# Patient Record
Sex: Male | Born: 1960
Health system: Southern US, Community
[De-identification: ages and names within clinical notes are randomized; demographics above are authoritative.]

## PROBLEM LIST (undated history)

## (undated) DIAGNOSIS — F419 Anxiety disorder, unspecified: Secondary | ICD-10-CM

## (undated) DIAGNOSIS — G4730 Sleep apnea, unspecified: Secondary | ICD-10-CM

## (undated) DIAGNOSIS — G43909 Migraine, unspecified, not intractable, without status migrainosus: Secondary | ICD-10-CM

## (undated) DIAGNOSIS — E119 Type 2 diabetes mellitus without complications: Secondary | ICD-10-CM

## (undated) DIAGNOSIS — N200 Calculus of kidney: Secondary | ICD-10-CM

## (undated) DIAGNOSIS — G629 Polyneuropathy, unspecified: Secondary | ICD-10-CM

## (undated) DIAGNOSIS — I739 Peripheral vascular disease, unspecified: Secondary | ICD-10-CM

## (undated) DIAGNOSIS — G4700 Insomnia, unspecified: Secondary | ICD-10-CM

## (undated) DIAGNOSIS — J45909 Unspecified asthma, uncomplicated: Secondary | ICD-10-CM

## (undated) DIAGNOSIS — Z87442 Personal history of urinary calculi: Secondary | ICD-10-CM

## (undated) DIAGNOSIS — Z85828 Personal history of other malignant neoplasm of skin: Secondary | ICD-10-CM

## (undated) DIAGNOSIS — Z8679 Personal history of other diseases of the circulatory system: Secondary | ICD-10-CM

## (undated) DIAGNOSIS — M5090 Cervical disc disorder, unspecified, unspecified cervical region: Secondary | ICD-10-CM

## (undated) DIAGNOSIS — R519 Headache, unspecified: Secondary | ICD-10-CM

## (undated) DIAGNOSIS — M1990 Unspecified osteoarthritis, unspecified site: Secondary | ICD-10-CM

## (undated) DIAGNOSIS — C801 Malignant (primary) neoplasm, unspecified: Secondary | ICD-10-CM

## (undated) DIAGNOSIS — E7800 Pure hypercholesterolemia, unspecified: Secondary | ICD-10-CM

## (undated) DIAGNOSIS — I10 Essential (primary) hypertension: Secondary | ICD-10-CM

## (undated) DIAGNOSIS — Z8701 Personal history of pneumonia (recurrent): Secondary | ICD-10-CM

## (undated) DIAGNOSIS — R531 Weakness: Secondary | ICD-10-CM

## (undated) DIAGNOSIS — E782 Mixed hyperlipidemia: Secondary | ICD-10-CM

## (undated) DIAGNOSIS — D649 Anemia, unspecified: Secondary | ICD-10-CM

## (undated) DIAGNOSIS — J189 Pneumonia, unspecified organism: Secondary | ICD-10-CM

## (undated) DIAGNOSIS — Z8709 Personal history of other diseases of the respiratory system: Secondary | ICD-10-CM

## (undated) HISTORY — DX: Calculus of kidney: N20.0

## (undated) HISTORY — PX: TONSILLECTOMY: SUR1361

## (undated) HISTORY — DX: Polyneuropathy, unspecified: G62.9

## (undated) HISTORY — PX: ELBOW SURGERY: SHX618

## (undated) HISTORY — DX: Type 2 diabetes mellitus without complications: E11.9

## (undated) HISTORY — DX: Peripheral vascular disease, unspecified: I73.9

## (undated) HISTORY — DX: Pure hypercholesterolemia, unspecified: E78.00

## (undated) HISTORY — PX: CERVICAL DISC SURGERY: SHX588

## (undated) HISTORY — DX: Cervical disc disorder, unspecified, unspecified cervical region: M50.90

## (undated) HISTORY — DX: Essential (primary) hypertension: I10

## (undated) HISTORY — DX: Personal history of other malignant neoplasm of skin: Z85.828

## (undated) HISTORY — DX: Personal history of pneumonia (recurrent): Z87.01

## (undated) HISTORY — PX: EYE SURGERY: SHX253

## (undated) HISTORY — DX: Mixed hyperlipidemia: E78.2

## (undated) HISTORY — DX: Migraine, unspecified, not intractable, without status migrainosus: G43.909

---

## 1993-01-11 HISTORY — PX: VASECTOMY: SHX75

## 2004-01-27 ENCOUNTER — Ambulatory Visit (HOSPITAL_COMMUNITY): Admission: RE | Admit: 2004-01-27 | Discharge: 2004-01-27 | Payer: Self-pay | Admitting: Family Medicine

## 2004-03-09 ENCOUNTER — Ambulatory Visit (HOSPITAL_COMMUNITY): Payer: Self-pay | Admitting: Neurology

## 2004-03-09 ENCOUNTER — Encounter (HOSPITAL_COMMUNITY): Admission: RE | Admit: 2004-03-09 | Discharge: 2004-04-08 | Payer: Self-pay | Admitting: Neurology

## 2006-11-18 ENCOUNTER — Ambulatory Visit (HOSPITAL_COMMUNITY): Admission: RE | Admit: 2006-11-18 | Discharge: 2006-11-18 | Payer: Self-pay | Admitting: Pediatrics

## 2006-11-22 ENCOUNTER — Ambulatory Visit: Payer: Self-pay | Admitting: Cardiology

## 2006-11-22 ENCOUNTER — Ambulatory Visit (HOSPITAL_COMMUNITY): Admission: RE | Admit: 2006-11-22 | Discharge: 2006-11-22 | Payer: Self-pay | Admitting: Pediatrics

## 2008-01-12 HISTORY — PX: LUNG SURGERY: SHX703

## 2008-03-22 ENCOUNTER — Ambulatory Visit: Payer: Self-pay | Admitting: Critical Care Medicine

## 2008-03-22 ENCOUNTER — Inpatient Hospital Stay (HOSPITAL_COMMUNITY): Admission: EM | Admit: 2008-03-22 | Discharge: 2008-04-01 | Payer: Self-pay | Admitting: Emergency Medicine

## 2008-03-23 ENCOUNTER — Ambulatory Visit: Payer: Self-pay | Admitting: Thoracic Surgery (Cardiothoracic Vascular Surgery)

## 2008-03-24 ENCOUNTER — Encounter: Payer: Self-pay | Admitting: Thoracic Surgery (Cardiothoracic Vascular Surgery)

## 2008-03-25 ENCOUNTER — Encounter: Payer: Self-pay | Admitting: Thoracic Surgery (Cardiothoracic Vascular Surgery)

## 2008-04-05 ENCOUNTER — Ambulatory Visit: Payer: Self-pay | Admitting: Thoracic Surgery (Cardiothoracic Vascular Surgery)

## 2008-04-19 ENCOUNTER — Encounter
Admission: RE | Admit: 2008-04-19 | Discharge: 2008-04-19 | Payer: Self-pay | Admitting: Thoracic Surgery (Cardiothoracic Vascular Surgery)

## 2008-04-19 ENCOUNTER — Ambulatory Visit: Payer: Self-pay | Admitting: Thoracic Surgery (Cardiothoracic Vascular Surgery)

## 2010-04-23 LAB — BASIC METABOLIC PANEL
BUN: 19 mg/dL (ref 6–23)
BUN: 20 mg/dL (ref 6–23)
BUN: 13 mg/dL (ref 6–23)
CO2: 25 mEq/L (ref 19–32)
CO2: 29 mEq/L (ref 19–32)
CO2: 30 mEq/L (ref 19–32)
Calcium: 8.5 mg/dL (ref 8.4–10.5)
Calcium: 8.5 mg/dL (ref 8.4–10.5)
Chloride: 102 mEq/L (ref 96–112)
Chloride: 99 mEq/L (ref 96–112)
Creatinine, Ser: 1.16 mg/dL (ref 0.4–1.5)
Creatinine, Ser: 1.23 mg/dL (ref 0.4–1.5)
GFR calc Af Amer: >60 mL/min (ref >60)
GFR calc Af Amer: >60 mL/min (ref >60)
GFR calc non Af Amer: 47 mL/min — ABNORMAL LOW (ref >60)
Glucose, Bld: 105 mg/dL — ABNORMAL HIGH (ref 70–99)
Glucose, Bld: 140 mg/dL — ABNORMAL HIGH (ref 70–99)
Potassium: 4.2 mEq/L (ref 3.5–5.1)
Potassium: 4.5 mEq/L (ref 3.5–5.1)
Potassium: 4.5 mEq/L (ref 3.5–5.1)
Sodium: 138 mEq/L (ref 135–145)
Sodium: 135 mEq/L (ref 135–145)
Sodium: 137 mEq/L (ref 135–145)

## 2010-04-23 LAB — GLUCOSE, CAPILLARY
Glucose-Capillary: 117 mg/dL — ABNORMAL HIGH (ref 70–99)
Glucose-Capillary: 140 mg/dL — ABNORMAL HIGH (ref 70–99)
Glucose-Capillary: 144 mg/dL — ABNORMAL HIGH (ref 70–99)
Glucose-Capillary: 154 mg/dL — ABNORMAL HIGH (ref 70–99)
Glucose-Capillary: 161 mg/dL — ABNORMAL HIGH (ref 70–99)
Glucose-Capillary: 162 mg/dL — ABNORMAL HIGH (ref 70–99)
Glucose-Capillary: 192 mg/dL — ABNORMAL HIGH (ref 70–99)
Glucose-Capillary: 194 mg/dL — ABNORMAL HIGH (ref 70–99)
Glucose-Capillary: 211 mg/dL — ABNORMAL HIGH (ref 70–99)
Glucose-Capillary: 218 mg/dL — ABNORMAL HIGH (ref 70–99)
Glucose-Capillary: 58 mg/dL — ABNORMAL LOW (ref 70–99)
Glucose-Capillary: 60 mg/dL — ABNORMAL LOW (ref 70–99)
Glucose-Capillary: 78 mg/dL (ref 70–99)
Glucose-Capillary: 109 mg/dL — ABNORMAL HIGH (ref 70–99)
Glucose-Capillary: 126 mg/dL — ABNORMAL HIGH (ref 70–99)
Glucose-Capillary: 134 mg/dL — ABNORMAL HIGH (ref 70–99)
Glucose-Capillary: 149 mg/dL — ABNORMAL HIGH (ref 70–99)
Glucose-Capillary: 155 mg/dL — ABNORMAL HIGH (ref 70–99)
Glucose-Capillary: 167 mg/dL — ABNORMAL HIGH (ref 70–99)
Glucose-Capillary: 169 mg/dL — ABNORMAL HIGH (ref 70–99)
Glucose-Capillary: 171 mg/dL — ABNORMAL HIGH (ref 70–99)

## 2010-04-23 LAB — CBC
HCT: 25.3 % — ABNORMAL LOW (ref 39.0–52.0)
HCT: 26.9 % — ABNORMAL LOW (ref 39.0–52.0)
HCT: 28.7 % — ABNORMAL LOW (ref 39.0–52.0)
HCT: 29 % — ABNORMAL LOW (ref 39.0–52.0)
HCT: 29.1 % — ABNORMAL LOW (ref 39.0–52.0)
HCT: 29.3 % — ABNORMAL LOW (ref 39.0–52.0)
HCT: 31.2 % — ABNORMAL LOW (ref 39.0–52.0)
HCT: 34.2 % — ABNORMAL LOW (ref 39.0–52.0)
HCT: 34.3 % — ABNORMAL LOW (ref 39.0–52.0)
Hemoglobin: 10.1 g/dL — ABNORMAL LOW (ref 13.0–17.0)
Hemoglobin: 8.6 g/dL — ABNORMAL LOW (ref 13.0–17.0)
Hemoglobin: 9.1 g/dL — ABNORMAL LOW (ref 13.0–17.0)
Hemoglobin: 9.8 g/dL — ABNORMAL LOW (ref 13.0–17.0)
Hemoglobin: 9.8 g/dL — ABNORMAL LOW (ref 13.0–17.0)
Hemoglobin: 9.9 g/dL — ABNORMAL LOW (ref 13.0–17.0)
Hemoglobin: 10.6 g/dL — ABNORMAL LOW (ref 13.0–17.0)
Hemoglobin: 11.8 g/dL — ABNORMAL LOW (ref 13.0–17.0)
Hemoglobin: 11.9 g/dL — ABNORMAL LOW (ref 13.0–17.0)
MCHC: 33.7 g/dL (ref 30.0–36.0)
MCHC: 33.9 g/dL (ref 30.0–36.0)
MCHC: 33.9 g/dL (ref 30.0–36.0)
MCHC: 34.1 g/dL (ref 30.0–36.0)
MCHC: 34.3 g/dL (ref 30.0–36.0)
MCHC: 35 g/dL (ref 30.0–36.0)
MCHC: 33.8 g/dL (ref 30.0–36.0)
MCHC: 34.4 g/dL (ref 30.0–36.0)
MCHC: 34.7 g/dL (ref 30.0–36.0)
MCV: 87.9 fL (ref 78.0–100.0)
MCV: 88.3 fL (ref 78.0–100.0)
MCV: 88.4 fL (ref 78.0–100.0)
MCV: 88.4 fL (ref 78.0–100.0)
MCV: 88.8 fL (ref 78.0–100.0)
MCV: 89.2 fL (ref 78.0–100.0)
MCV: 87.9 fL (ref 78.0–100.0)
MCV: 88 fL (ref 78.0–100.0)
MCV: 88.1 fL (ref 78.0–100.0)
Platelets: 317 10*3/uL (ref 150–400)
Platelets: 355 10*3/uL (ref 150–400)
Platelets: 372 10*3/uL (ref 150–400)
Platelets: 398 10*3/uL (ref 150–400)
Platelets: 430 10*3/uL — ABNORMAL HIGH (ref 150–400)
Platelets: 371 10*3/uL (ref 150–400)
Platelets: 406 10*3/uL — ABNORMAL HIGH (ref 150–400)
Platelets: 424 10*3/uL — ABNORMAL HIGH (ref 150–400)
RBC: 2.85 MIL/uL — ABNORMAL LOW (ref 4.22–5.81)
RBC: 3.02 MIL/uL — ABNORMAL LOW (ref 4.22–5.81)
RBC: 3.24 MIL/uL — ABNORMAL LOW (ref 4.22–5.81)
RBC: 3.28 MIL/uL — ABNORMAL LOW (ref 4.22–5.81)
RBC: 3.29 MIL/uL — ABNORMAL LOW (ref 4.22–5.81)
RBC: 3.33 MIL/uL — ABNORMAL LOW (ref 4.22–5.81)
RBC: 3.54 MIL/uL — ABNORMAL LOW (ref 4.22–5.81)
RBC: 3.89 MIL/uL — ABNORMAL LOW (ref 4.22–5.81)
RBC: 3.9 MIL/uL — ABNORMAL LOW (ref 4.22–5.81)
RDW: 13.7 % (ref 11.5–15.5)
RDW: 13.8 % (ref 11.5–15.5)
RDW: 13.9 % (ref 11.5–15.5)
RDW: 13.9 % (ref 11.5–15.5)
RDW: 14 % (ref 11.5–15.5)
RDW: 14.3 % (ref 11.5–15.5)
RDW: 13.5 % (ref 11.5–15.5)
RDW: 13.9 % (ref 11.5–15.5)
RDW: 13.9 % (ref 11.5–15.5)
WBC: 11.6 10*3/uL — ABNORMAL HIGH (ref 4.0–10.5)
WBC: 12.5 10*3/uL — ABNORMAL HIGH (ref 4.0–10.5)
WBC: 8.2 10*3/uL (ref 4.0–10.5)
WBC: 12 10*3/uL — ABNORMAL HIGH (ref 4.0–10.5)
WBC: 14.5 10*3/uL — ABNORMAL HIGH (ref 4.0–10.5)

## 2010-04-23 LAB — DIFFERENTIAL
Basophils Absolute: 0 10*3/uL (ref 0.0–0.1)
Basophils Absolute: 0.1 10*3/uL (ref 0.0–0.1)
Basophils Absolute: 0 10*3/uL (ref 0.0–0.1)
Basophils Relative: 0 % (ref 0–1)
Basophils Relative: 1 % (ref 0–1)
Basophils Relative: 0 % (ref 0–1)
Basophils Relative: 0 % (ref 0–1)
Eosinophils Absolute: 0.2 10*3/uL (ref 0.0–0.7)
Eosinophils Absolute: 0 10*3/uL (ref 0.0–0.7)
Eosinophils Relative: 2 % (ref 0–5)
Eosinophils Relative: 0 % (ref 0–5)
Eosinophils Relative: 3 % (ref 0–5)
Lymphocytes Relative: 14 % (ref 12–46)
Lymphocytes Relative: 23 % (ref 12–46)
Lymphocytes Relative: 12 % (ref 12–46)
Lymphocytes Relative: 18 % (ref 12–46)
Lymphs Abs: 1.5 10*3/uL (ref 0.7–4.0)
Monocytes Absolute: 1 10*3/uL (ref 0.1–1.0)
Monocytes Relative: 5 % (ref 3–12)
Neutro Abs: 4.7 10*3/uL (ref 1.7–7.7)
Neutro Abs: 6.9 10*3/uL (ref 1.7–7.7)
Neutro Abs: 9.8 10*3/uL — ABNORMAL HIGH (ref 1.7–7.7)
Neutrophils Relative %: 57 % (ref 43–77)
Neutrophils Relative %: 82 % — ABNORMAL HIGH (ref 43–77)

## 2010-04-23 LAB — COMPREHENSIVE METABOLIC PANEL
ALT: 25 U/L (ref 0–53)
ALT: 53 U/L (ref 0–53)
AST: 26 U/L (ref 0–37)
Alkaline Phosphatase: 113 U/L (ref 39–117)
Alkaline Phosphatase: 72 U/L (ref 39–117)
Alkaline Phosphatase: 80 U/L (ref 39–117)
BUN: 16 mg/dL (ref 6–23)
BUN: 17 mg/dL (ref 6–23)
BUN: 20 mg/dL (ref 6–23)
CO2: 29 mEq/L (ref 19–32)
CO2: 25 mEq/L (ref 19–32)
Calcium: 8.1 mg/dL — ABNORMAL LOW (ref 8.4–10.5)
Calcium: 8.9 mg/dL (ref 8.4–10.5)
Chloride: 103 mEq/L (ref 96–112)
Chloride: 105 mEq/L (ref 96–112)
Creatinine, Ser: 1.2 mg/dL (ref 0.4–1.5)
Creatinine, Ser: 1.31 mg/dL (ref 0.4–1.5)
Creatinine, Ser: 0.94 mg/dL (ref 0.4–1.5)
GFR calc Af Amer: >60 mL/min (ref >60)
GFR calc non Af Amer: >60 mL/min (ref >60)
GFR calc non Af Amer: >60 mL/min (ref >60)
Glucose, Bld: 111 mg/dL — ABNORMAL HIGH (ref 70–99)
Glucose, Bld: 170 mg/dL — ABNORMAL HIGH (ref 70–99)
Glucose, Bld: 181 mg/dL — ABNORMAL HIGH (ref 70–99)
Potassium: 3.6 mEq/L (ref 3.5–5.1)
Potassium: 4.4 mEq/L (ref 3.5–5.1)
Sodium: 143 mEq/L (ref 135–145)
Sodium: 132 mEq/L — ABNORMAL LOW (ref 135–145)
Total Bilirubin: 0.7 mg/dL (ref 0.3–1.2)
Total Bilirubin: 0.7 mg/dL (ref 0.3–1.2)
Total Protein: 4.8 g/dL — ABNORMAL LOW (ref 6.0–8.3)
Total Protein: 6.8 g/dL (ref 6.0–8.3)

## 2010-04-23 LAB — ANAEROBIC CULTURE: Gram Stain: NONE SEEN

## 2010-04-23 LAB — URINALYSIS, ROUTINE W REFLEX MICROSCOPIC
Bilirubin Urine: NEGATIVE
Glucose, UA: NEGATIVE mg/dL
Leukocytes, UA: NEGATIVE
Nitrite: NEGATIVE
Specific Gravity, Urine: 1.02 (ref 1.005–1.030)
Urobilinogen, UA: 0.2 mg/dL (ref 0.0–1.0)
pH: 6 (ref 5.0–8.0)

## 2010-04-23 LAB — TYPE AND SCREEN
ABO/RH(D): A POS
Antibody Screen: NEGATIVE

## 2010-04-23 LAB — MAGNESIUM
Magnesium: 1.6 mg/dL (ref 1.5–2.5)
Magnesium: 1.7 mg/dL (ref 1.5–2.5)
Magnesium: 1.8 mg/dL (ref 1.5–2.5)
Magnesium: 2.1 mg/dL (ref 1.5–2.5)

## 2010-04-23 LAB — CHOLESTEROL, TOTAL: Cholesterol: 129 mg/dL (ref 0–200)

## 2010-04-23 LAB — POCT I-STAT 3, ART BLOOD GAS (G3+)
Acid-base deficit: 6 mmol/L — ABNORMAL HIGH (ref 0.0–2.0)
Bicarbonate: 21.6 mEq/L (ref 20.0–24.0)
O2 Saturation: 92 %
O2 Saturation: 96 %
Patient temperature: 100.2
TCO2: 27 mmol/L (ref 0–100)
TCO2: 29 mmol/L (ref 0–100)
pCO2 arterial: 40.6 mmHg (ref 35.0–45.0)
pCO2 arterial: 36.8 mmHg (ref 35.0–45.0)
pH, Arterial: 7.449 (ref 7.350–7.450)
pH, Arterial: 7.231 — ABNORMAL LOW (ref 7.350–7.450)
pO2, Arterial: 71 mmHg — ABNORMAL LOW (ref 80.0–100.0)
pO2, Arterial: 82 mmHg (ref 80.0–100.0)
pO2, Arterial: 159 mmHg — ABNORMAL HIGH (ref 80.0–100.0)
pO2, Arterial: 91 mmHg (ref 80.0–100.0)

## 2010-04-23 LAB — PHOSPHORUS
Phosphorus: 2.3 mg/dL (ref 2.3–4.6)
Phosphorus: 4.6 mg/dL (ref 2.3–4.6)

## 2010-04-23 LAB — TISSUE CULTURE

## 2010-04-23 LAB — URINE MICROSCOPIC-ADD ON

## 2010-04-23 LAB — LIPASE, BLOOD: Lipase: 14 U/L (ref 11–59)

## 2010-04-23 LAB — BLOOD GAS, ARTERIAL
Bicarbonate: 25.6 mEq/L — ABNORMAL HIGH (ref 20.0–24.0)
Bicarbonate: 28.9 mEq/L — ABNORMAL HIGH (ref 20.0–24.0)
PEEP: 5 cmH2O
Patient temperature: 100.7
Patient temperature: 98.6
TCO2: 30.4 mmol/L (ref 0–100)
pCO2 arterial: 42.5 mmHg (ref 35.0–45.0)
pCO2 arterial: 48.2 mmHg — ABNORMAL HIGH (ref 35.0–45.0)
pH, Arterial: 7.396 (ref 7.350–7.450)
pH, Arterial: 7.403 (ref 7.350–7.450)
pO2, Arterial: 122 mmHg — ABNORMAL HIGH (ref 80.0–100.0)

## 2010-04-23 LAB — BODY FLUID CULTURE: Culture: NO GROWTH

## 2010-04-23 LAB — PROTIME-INR: INR: 1.2 (ref 0.00–1.49)

## 2010-04-23 LAB — APTT: aPTT: 40 seconds — ABNORMAL HIGH (ref 24–37)

## 2010-04-23 LAB — PREALBUMIN: Prealbumin: 8.7 mg/dL — ABNORMAL LOW (ref 18.0–45.0)

## 2010-05-26 NOTE — H&P (Signed)
NAMEKARLIN, HEILMAN                   ACCOUNT NO.:  [id number]   MEDICAL RECORD NO.:  [id number]          PATIENT TYPE:  INP   LOCATION:  A301                          FACILITY:  [facility]   PHYSICIAN:  Edward L. Juanetta Gosling, M.D.DATE OF BIRTH:  1960/02/16   DATE OF ADMISSION:  03/22/2008  DATE OF DISCHARGE:  03/13/2010LH                              HISTORY & PHYSICAL   Mr. Sheffler is a 50 year old male who came to the emergency room because of  back pain.  He says that he has been having back pain and shortness of  breath, cough, congestion, etc.  He has previous history of neuropathy  and has to be on narcotics for that, but he says that this pain is worse  than his neuropathic pain and it does not respond to his chronic  narcotic therapy.  He has been coughing and congested.  He is unsure if  he has had any fever.   PAST MEDICAL HISTORY:  Positive for hypertension, diabetes,  hyperlipidemia, and then neuropathy.  He has had cervical disk surgery,  elbow surgery, and tonsillectomy.   SOCIAL HISTORY:  He does not use any alcohol.  He does not use any  illicit drugs.  He does have about a 40-50 pack-year smoking history and  stopped within this year.   HOME MEDICATIONS:  1. Oxycodone 30 mg q.4 h. as needed.  2. Metformin 1000 mg daily.  3. Glyburide 5 mg daily.  4. Lovastatin 20 mg daily.  5. Lyrica 300 mg daily.  6. Lisinopril 10 mg daily.  7. Seroquel 300 mg twice a day.  8. Cymbalta 60 mg two tablets twice a day.  9. Morphine extended release 200 mg every 8 hours.   REVIEW OF SYSTEMS:  Except as mentioned is negative.   FAMILY HISTORY:  Positive for stroke.   PHYSICAL EXAMINATION:  GENERAL:  He is awake, but sluggish.  VITAL SIGNS:  His blood pressure 134/76, pulse is 102, and respirations  16.  He is afebrile now.  HEENT:  His pupils are reactive.  His nose and throat are clear.  His  mucous membranes are moist.  CHEST:  He is splinting his right side.  He has rales on the  right.  HEART:  Regular.   LABORATORY DATA:  His electrolytes are normal, glucose 140, white count  10,000, yesterday his white count was 12,000.  I have reviewed his CT of  the chest and it looks like he may have empyema at least loculated  pleural effusion.  He also has a right middle lobe pneumonia.   Assessment then is that he has pneumonia associated with a loculated  pleural effusion .   PLAN:  I have discussed his situation with Dr. Elige Radon, the hospitalist,  and with Dr. Andrey Spearman, the thoracic surgeon.  Dr. Dorris Fetch  is agreed to accept him in transfer for potential VATS procedure.      Edward L. Juanetta Gosling, M.D.  Electronically Signed     ELH/MEDQ  D:  03/23/2008  T:  03/23/2008  Job:  [id number]

## 2010-05-26 NOTE — Procedures (Signed)
[name], Wayne Pratt                   ACCOUNT NO.:  [id number]   MEDICAL RECORD NO.:  [id number]          PATIENT TYPE:  OUT   LOCATION:  RAD                           FACILITY:  [facility]   PHYSICIAN:  Gerrit Friends. Dietrich Pates, MD, FACCDATE OF BIRTH:  1960-08-05   DATE OF PROCEDURE:  11/22/2006  DATE OF DISCHARGE:                                ECHOCARDIOGRAM   REFERRING PHYSICIAN:  Francoise Schaumann. Halm, DO, FAAP   CLINICAL DATA:  A 50 year old gentleman with hypertension, diabetes and  edema.   M-MODE TRACINGS:  Aorta 3.6, left atrium 4.5, septum 1.4, posterior wall  1.4, LV diastole 5.5, LV systole 4.6.   FINDINGS:  1. Technically adequate echocardiographic study.  2. Very mild left atrial enlargement; normal right atrium and right      ventricle.  3. Trileaflet and normal aortic valve; normal diameter of the proximal      ascending aorta; mild to moderate calcification of the wall and      annulus.  4. Normal mitral valve; mild regurgitation.  5. Normal tricuspid valve with physiologic regurgitation; normal      pulmonic valve and proximal pulmonary artery.  6. Left ventricular size at the upper limit of normal; mild concentric      hypertrophy; normal regional and global LV systolic function.  7. IVC is suboptimally imaged; no gross abnormalities appreciated.      Gerrit Friends. Dietrich Pates, MD, [facility]  Electronically Signed     RMR/MEDQ  D:  11/24/2006  T:  11/25/2006  Job:  [id number]

## 2010-05-26 NOTE — Op Note (Signed)
Wayne Pratt, Wayne Pratt                   ACCOUNT NO.:  [id number]   MEDICAL RECORD NO.:  [id number]           PATIENT TYPE:   LOCATION:                                 FACILITY:   PHYSICIAN:  Salvatore Decent. Dorris Fetch, M.D.DATE OF BIRTH:  Jun 06, 1960   DATE OF PROCEDURE:  DATE OF DISCHARGE:                               OPERATIVE REPORT   PREOPERATIVE DIAGNOSIS:  Probable right empyema.   POSTOPERATIVE DIAGNOSIS:  Organizing right empyema.   PROCEDURE:  Right VATS, drainage of empyema, decortication of visceral  and parietal pleura.   SURGEON:  Salvatore Decent. Dorris Fetch, MD   ASSISTANT:  Rowe Clack, PA-C   ANESTHESIA:  General.   FINDINGS:  Extensive organizing empyema with multiple loculated fluid  collections approximately 1.5 L of fluid in total, relatively dense  visceral pleural peel, more edematous less dense parietal pleural peel.   CLINICAL NOTE:  Wayne Pratt is a 50 year old gentleman who presents with a  history of chest pain and shortness of breath.  He was transferred From  [facility] after a chest CT showed a probable right empyema.  There was no evidence of an esophageal disorder.  The patient was  advised to undergo right VATS for drainage of pleural effusion and  possible decortication.  The indications, risks, benefits, and  alternatives were discussed in detail with the patient.  He understood  and accepted the risk and agreed to proceed.   OPERATIVE NOTE:  Wayne Pratt was brought to the preop holding area on March 25, 2008.  There, Dr. Sheldon Silvan placed central venous line, an  arterial blood pressure monitoring line.  PAS hose were placed for DVT  prophylaxis.  The patient was already on intravenous antibiotics.  He  was taken to the operating room, anesthetized and intubated with a  double-lumen endotracheal tube.  A Foley catheter was placed.  He was  placed in a left lateral decubitus position, and the right chest was  prepped and draped in the usual  fashion.  An incision was made in the  midaxillary line and approximately the seventh intercostal space and was  carried through the skin and subcutaneous tissue.  The chest was entered  bluntly using a hemostat.  Palpation with a finger revealed some  adhesions which were easy to takedown.  The thoracoscope then was  inserted, and there was extensive fibrinous exudate and multiple  loculated fluid collections.  Chest cavity was explored systematically,  breaking up these loculations, removing the fibrinous exudate, and then  draining the fluid.  Both the exudate as well as the fluid were sent for  aerobic and anaerobic cultures.  The largest collection was lateral and  it was approximately a liter.  There were also loculated collections  along the diaphragm in the fissures and anteriorly and apically.  The  right middle lobe and lower lobe were heavily encased with a thick  pleural peel.  This was debrided using blunt dissection.  The underlying  tissue was very friable.  The diaphragm had multiple areas of exudate  and this was  debrided as well as possible.  There was extensive  fibrinous exudate and peel along the medial aspect of the right middle  lobe in the inferior portion of the right upper lobe as well as encasing  essentially the entire right lower lobe.  A great deal of time was spent  removing as much of the visceral pleural peel as possible.  The parietal  pleural exudate then was removed.  The underlying parietal pleura was  very friable and bled easily.  A test inflation of the lung was  performed.  There was no significant air leak, and there was good re-  expansion of the lung.  A 36-French right-angle chest tube was placed  posteriorly along the paravertebral gutter.  A 36-French anterior tube  was placed essentially horizontally over the diaphragm.  The lung was  reinflated.  The scope was withdrawn.  The remaining 2 incisions were  closed with 0 Vicryl fascial sutures  and 3-0 Vicryl subcuticular  sutures.  The patient was then placed in a supine position.  The double-  lumen endotracheal tube was removed and replaced with a single-lumen  endotracheal tube.  The patient then was taken to the surgical intensive  care unit in fair condition.  All sponge, needle, and sponge counts were  correct at the end of the procedure.      Salvatore Decent Dorris Fetch, M.D.  Electronically Signed     SCH/MEDQ  D:  03/25/2008  T:  03/26/2008  Job:  [id number]   cc:   Ramon Dredge L. Juanetta Gosling, M.D.

## 2010-05-26 NOTE — Discharge Summary (Signed)
NAMEBRAELON, Wayne Pratt                   ACCOUNT NO.:  [id number]   MEDICAL RECORD NO.:  [id number]          PATIENT TYPE:  INP   LOCATION:  2021                         FACILITY:  [facility]   PHYSICIAN:  Salvatore Decent. Dorris Fetch, M.D.DATE OF BIRTH:  1960-03-22   DATE OF ADMISSION:  03/23/2008  DATE OF DISCHARGE:  04/01/2008                               DISCHARGE SUMMARY   HISTORY:  The patient is a 50 year old white male who was transferred  from [facility] where he presented with severe back pain.  The  patient stated that he had an approximately 1 week of increasing back  pain that was associated with shortness of breath.  He presented to the  emergency department at [facility] and evaluations revealed findings on  the CT scan consistent with a complex right-sided pleural effusion  versus empyema.  He was subsequently transferred to [facility]  for further evaluation and treatment.  Of note, the patient's history  does include chronic back pain and neuropathy for which she takes high-  dose narcotics on a routine basis.  He was transferred to [facility] and  seen in thoracic surgical consultation by Dr. Charlett Lango and  plans were made for further treatment to include drainage of the  empyema.   PAST MEDICAL HISTORY:  1. Hypertension.  2. Diabetes.  3. Hypercholesterolemia.  4. Neuropathy.   PAST SURGICAL HISTORY:  Elbow surgery, tonsillectomy, cervical disk  surgery.   SOCIAL HISTORY:  The patient is a tobacco user, 2 packs per day for over  20 years.  He did reportedly quit approximately 1 month prior to this  hospitalization.   ALLERGIES:  PENICILLIN.   HOME MEDICATIONS:  1. Oxycodone 30 mg 1-2 tablets every 4 hours p.r.n.  2. Metformin 1000 mg daily.  3. Glyburide 5 mg daily.  4. Lovastatin 20 mg daily.  5. Lisinopril 10 mg daily.  6. Seroquel 300 mg twice daily.  7. Cymbalta 60 mg 2 tablets twice daily.  8. Morphine 200 mg q.8 h.   REVIEW OF  SYSTEMS:  Please see the history and physical done at the time  of admission.   PHYSICAL EXAMINATION:  Please see the history and physical done at the  time of admission.   HOSPITAL COURSE:  The patient was transferred from [facility].  He was  felt to be stable to proceed with surgery and on March 25, 2008, he was  taken to the operating room where he underwent the following procedure:  Right video-assisted thoracoscopy for drainage and decortication of  empyema.  The patient tolerated the procedure well.  He was felt to  require ongoing intubation and he proceeded to the surgical intensive  care unit in stable condition.   POSTOPERATIVE HOSPITAL COURSE:  The patient has been seen in pulmonary  medicine consultation during the postoperative period while he was in  the surgical intensive care unit.  He was treated with aggressive  antibiotics including vancomycin and Avelox.  He did have a  postoperative ileus and since there was a possible question of some  swallowing difficulties preoperatively, he was started on a short-term  of parenteral nutrition.  Additionally, he had some postoperative  difficulties with presumed narcotic withdrawal and agitation.  He was  seen in psychiatry consultation by Dr. Antonietta Breach.  His delirium  was felt to be possibly multifactorial and his medications were  adjusted.  His cultures postoperatively have been unremarkable.  His  antibiotics were continued up until March 30, 2008.  He remains  afebrile.  His diabetes has been under adequate control.  Initially, he  was given Lantus, but his oral regimen has been restarted.  Tentatively,  he is felt to be stable for possible discharge in the morning of April 01, 2008, pending morning of reevaluation and clarification with  psychiatry of his home medication regimen.   MEDICATIONS AT THE TIME OF THIS DICTATION FOR DISCHARGE:  1. Metformin 1000 mg daily.  2. Glyburide 5 mg daily.  3. Lovastatin 20  mg daily.  4. Fosinopril 10 mg daily.  5. Cymbalta 60 mg 2 tablets twice daily.  6. Seroquel 50 mg twice daily.  7. Lyrica 100 mg twice daily.  8. Duragesic patch 75 mcg q.72 h.  9. Protonix 40 mg daily.   FINAL DIAGNOSIS:  Right-sided empyema, now status post drainage and  decortication.   OTHER DIAGNOSES:  1. Chronic narcotic dependence secondary to back pain and neuropathy.  2. History of non-insulin-dependent diabetes mellitus.  3. History of hypertension.  4. History of hypercholesterolemia.  5. History of previous elbow surgery.  6. History of previous tonsillectomy.  7. History of cervical disk surgery.   FOLLOWUP:  We will include suture removal from the chest tube sites next  week and 2-week follow up with Dr. Dorris Fetch.   INSTRUCTIONS:  The patient received written instructions in regard to  medications, activity, diet, wound care, and followup.      Rowe Clack, P.A.-C.      Salvatore Decent Dorris Fetch, M.D.  Electronically Signed    WEG/MEDQ  D:  03/31/2008  T:  03/31/2008  Job:  [id number]   cc:   Salvatore Decent. Dorris Fetch, M.D.  TCTS Office  Cecil Cranker, MD, [facility]

## 2010-05-26 NOTE — Discharge Summary (Signed)
Wayne Pratt, KALUZNY                   ACCOUNT NO.:  [id number]   MEDICAL RECORD NO.:  [id number]          PATIENT TYPE:  INP   LOCATION:  A301                          FACILITY:  [facility]   PHYSICIAN:  Dorris Singh, DO    DATE OF BIRTH:  23-Jul-1960   DATE OF ADMISSION:  03/22/2008  DATE OF DISCHARGE:  03/13/2010LH                               DISCHARGE SUMMARY   PRIMARY CARE PHYSICIAN:  Dr. Stephania Fragmin.   ADMISSION DIAGNOSES:  1. Empyema.  2. Back pain.  3. Diabetes with diabetic neuropathy.  4. Hypertension.  5. Hypercholesterolemia.  6. Hyponatremia.  7. Anemia.   DISCHARGE DIAGNOSES:  1. Empyema.  2. Back pain.  3. Diabetes with neuropathy.  4. Hypertension.  5. Hypercholesterolemia.  6. Hyponatremia.   HOSPITAL COURSE:  Patient was admitted to the service of [facility].  He  had several radiographic testing done which includes a CT of his abdomen  and pelvis on March 12 which demonstrated complex right pleural fluid  concerning for a loculated effusion, in addition has volume loss and  atelectasis throughout the right lower lobe, fatty infiltration of the  liver negative for kidney stones or hydronephrosis, large amount of  colonic stool, particularly in the cecum.  His pelvis shows no acute  findings.  He had a CT angio of his chest which was on March 12 which  was negative for large pulmonary embolism, right pleural effusion,  complex or loculated empyema can not be excluded, marked volume loss  throughout the right, hemothorax with consolidation of the right middle  lobe and extensive right atelectasis in the right lower lobe, patchy  ground glass ossifications in both lungs.  Differential includes  atelectasis versus infection, hepatic steatosis and mediastinal  lymphadenopathy.  He also had a 2-view chest x-ray today which  demonstrated cardiomyopathy, loculated right effusion and right lower  lobe atelectasis versus infiltrate or not, significantly changed from   prior.   HOSPITAL COURSE:  Patient was admitted to the service of [facility].  As  I mentioned above, testing was done, he was started on IV antibiotics  and Dr. Juanetta Gosling of pulmonology was consulted.  Sputum cultures were  obtained as well.  He was hydrated for his hyponatremia, he was hydrated  with normal saline for his hyponatremia and placed on all of his home  medications for his diabetes.  It was noted that patient also was anemic  and the continued to monitor him.  He was also placed on DVT and GI  prophylaxis.  Dr. Juanetta Gosling saw patient and recommended that he be  transferred to [facility].  He spoke with the vascular thoracic surgeons  who have accepted him and he will be sent there today via [facility].   CONDITION:  Stable.   DISPOSITION:  To [place].   He will be sent to the hospital on the following medications that he is  currently on, which include:  1. Metformin 1000 mg p.o. daily with meals.  2. Glyburide 5 mg p.o. daily a.c.  3. Simvastatin 10 mg p.o. at night.  4.  Lyrica 300 mg p.o. b.i.d.  5. Lisinopril 10 mg p.o. daily.  6. Seroquel 300 mg p.o. b.i.d.  7. MS Contin 30 mg SR tablets p.o. q.8 hours.  8. Cymbalta 120 mg p.o. b.i.d.  9. Acetaminophen 650 mg p.o. daily.  10.Levaquin 750 mg IV q.24.  11.He is saline locked.  12.Oxycodone 5 mg tablets p.o. q.4 hours p.r.n.  13.Phenergan 12.5 mg IV q.4 hours.   Will continue to monitor patient and change therapy as necessary until  he is transferred to [facility].      Dorris Singh, DO  Electronically Signed     CB/MEDQ  D:  03/23/2008  T:  03/23/2008  Job:  [id number]

## 2010-05-26 NOTE — H&P (Signed)
Wayne Pratt, Wayne Pratt                   ACCOUNT NO.:  [id number]   MEDICAL RECORD NO.:  [id number]          PATIENT TYPE:  INP   LOCATION:  A301                          FACILITY:  [facility]   PHYSICIAN:  Skeet Latch, DO    DATE OF BIRTH:  12-06-1960   DATE OF ADMISSION:  03/22/2008  DATE OF DISCHARGE:  LH                              HISTORY & PHYSICAL   PRIMARY CARE PHYSICIAN:  Dr. Milford Cage.   CHIEF COMPLAINT:  Back pain.   HISTORY OF PRESENT ILLNESS:  This is a 49 year old Caucasian male who  presents with severe back pain.  The patient states for the past week or  so he has been having back pain with shortness of breath.  The patient  states today pain worsened, and he decided to come to the emergency room  to be evaluated.  He states the pain is located in the right side of his  back and has no radiation.  The patient states the pain is severe in  nature.  The patient states that he usually has severe pain in his lower  extremities due to neuropathy and takes narcotics for this on a daily  basis.  The patient states that the pain is kind of lower in his back  and right flank area, states it is 5/10 at its worst .   The patient's past medical history includes:  1. Hypertension.  2. Diabetes.  3. Hypercholesterolemia.  4. Neuropathy.   SURGICAL HISTORY:  Positive for elbow surgery, tonsillectomy and  cervical disk surgery.   SOCIAL HISTORY:  No history of alcohol or illicit drug abuse.  The  patient was a smoker, 2 packs per day, for over 20 years.  He said he  quit approximately 1 month ago.   ALLERGIES:  PENICILLIN.   HOME MEDICATIONS:  1. Oxycodone 30 mg 1-2 tablets every 4 hours as needed.  2. Metformin 1000 mg daily.  3. Glyburide 5 mg daily.  4. Lovastatin 20 mg daily.  5. Lyrica 300 mg daily.  6. Lisinopril 10 mg daily.  7. Seroquel 300 mg twice a day.  8. Cymbalta 60 mg 2 tablets twice a day.  9. Morphine 200 mg every 8 hours.   REVIEW OF SYSTEMS:  HEENT:   Unremarkable.  CARDIOVASCULAR:  No chest  pain, palpitations.  RESPIRATORY:  No cough, no dyspnea.  GENITOURINARY:  No urgency or frequency.  MUSCULOSKELETAL:  Positive for some flank pain  and back pain.   PHYSICAL EXAMINATION:  GENERAL:  This is a 50 year old, well-nourished,  well-hydrated, well-developed in no acute distress.  VITAL SIGNS:  Temperature is 98.0, pulse 107, respirations 20, blood  pressure 133/75, saturating 98% on room air.  HEENT:  Head is atraumatic, normocephalic.  EYES:  PERRLA.  EOMI.  NECK:  Soft, supple, nontender, nondistended.  CARDIOVASCULAR:  Tachycardiac but regular rhythm.  No murmurs, rubs or  gallops.  LUNGS:  He has distant heart sounds.  Decreased breath sounds  bilaterally, right greater than left.  No rhonchi or wheezing.  ABDOMEN:  Obese, soft, tender.  Looks like  he has a hernia in his  midepigastric region.  No rigidity or guarding.  No tenderness.  EXTREMITIES:  He has trace edema bilateral lower extremities.  No  clubbing or cyanosis.  NEUROLOGICAL:  Cranial nerves II-XII are grossly intact.  Patient is  alert and oriented x3.   LABORATORY DATA:  Lipase is 14.  Sodium 132, potassium 3.6, chloride 93,  CO2 25, glucose 181, BUN 20, creatinine 0.94.  Total bilirubin is 1.0,  alkaline phosphatase 80, AST 85, ALT 53, total protein 6.8, albumin 3.1.  White count is 12,000, hemoglobin 11.8, hematocrit 34.3, platelet count  406,000, 82% neutrophils.  Urinalysis:  Trace of blood, positive for  ketones, negative for nitrates or leukocyte esterase.  CT angiogram of  his chest negative for large pulmonary embolism.  Right pleural  effusions is complex or loculated.  Empyema cannot be excluded.  Marked  volume loss throughout the right hemithorax with consolidation right  middle lobe with extensive atelectasis right lower lobe.  Patchy ground-  glass opacification both lungs.  Differential includes atelectasis  versus infection.  Hepatic steatosis.   Mediastinal lymphadenopathy.  CT  of abdomen and pelvis showed fatty infiltration of the liver, negative  for kidney stone, no hydronephrosis, showed a large amount of colonic  stool - particularly in the cecum; no pelvic findings.   ASSESSMENT:  This is a 50 year old Caucasian male who presents with back  pain with radiation to his flank area.  The patient was found to have  possible empyema on CT of his chest.  The patient has history of  neuropathy diabetes, hypertension, and hypercholesterolemia.   PLAN:  1. The patient admitted to the service of [facility] to a general      medical bed.  2. For empyema/opacification of both lungs, will start the patient on      IV antibiotics.  Will get a pulmonology consult at this time.  Will      get sputum cultures and sensitivities if available.  3. For his leukocytosis, probably secondary to lung infection, will      continue to monitor closely.  4. For his hyponatremia, will hydrate the patient at this time and      continue to follow his sodium level.  5. For his diabetes, the patient will be placed on his home      medications.  The patient will be placed on sliding scale with      blood sugars checked a.c. and h.s.  6. The patient is slightly anemic.  Will continue to monitor at this      time.  If the patient's hemoglobin starts to drop, may need to      Hemoccult stools and get anemia panel.  7. The patient will be placed on DVT as well as GI prophylaxis.      Skeet Latch, DO  Electronically Signed     SM/MEDQ  D:  03/23/2008  T:  03/23/2008  Job:  [id number]   cc:   Francoise Schaumann. Milford Cage DO, FAAP  Fax: [phone]

## 2010-05-26 NOTE — Consult Note (Signed)
NAMENTHONY, LEFFERTS NO.:  [id number]   MEDICAL RECORD NO.:  [id number]          PATIENT TYPE:  INP   LOCATION:  2301                         FACILITY:  [facility]   PHYSICIAN:  Antonietta Breach, M.D.  DATE OF BIRTH:  [birth year]   DATE OF CONSULTATION:  03/29/2008  DATE OF DISCHARGE:                                 CONSULTATION   REQUESTING PHYSICIAN:  Charlcie Cradle. Delford Field, MD, FCCP.   REASON FOR CONSULTATION:  Psychosis, agitation.   HISTORY OF PRESENT ILLNESS:  Wayne Pratt is a 50 year old male admitted to  the [facility] on March 23, 2008 due to empyema.  He has undergone a  right VATS, drainage of the empyema, and decortication of the visceral  and parietal pleura.  This was performed on March 25, 2008.   Wayne Pratt has required in the past day Haldol 5 mg three times.  He has  had Ativan p.r.n. discontinued and received his last dose yesterday  morning at 1 mg.  Concerning p.r.n. opioids, he had 2 mg of oxycodone  last night at midnight.  He also has been receiving fentanyl, a  Duragesic patch, morphine sulfate 40 mg in the past 24 hours with the  PCA.  He has received 1 mg in the past 8 hours and a total of 33 mg in  the past day.  He also required 25 mg of morphine sulfate this past  shift in addition to the above.  He is on standing psychotropics of  Seroquel 300 mg b.i.d., which was a medication prior to admission.   He also is on pregabalin and this was discontinued as of yesterday.  He  received 100 mg.   He is on Cymbalta 120 mg b.i.d.   This morning Wayne Pratt has clouding of consciousness.  He does not know  the month.  He knows the year correctly.  He does not know where he is.  He follows one-stage commands.  He has very poor concentration and  attention.  He has poverty of speech.  He does answer questions about  the number of children, his employment status, and the fact that his  wife is working correctly.   He denies any hallucinations.  Also, he  is taking his p.o. medicines.  He is requiring four-point soft restraints with hand gloves.   He states that he has had 2 suicide attempts.  The validity of this is  not confirmed.   PAST PSYCHIATRIC HISTORY:  In review of the past medical record, Mr.  Pratt has listed in the emergency room report on March 22, 2008  psychotropic medications of Seroquel 300 mg and Cymbalta 60.  The dosage  is not listed in the history of present illness dictation.  The Seroquel  as listed 300 mg b.i.d., Cymbalta is listed at 120 mg b.i.d.   FAMILY PSYCHIATRIC HISTORY:  Not known.   SOCIAL HISTORY:  Two children.  Unemployed.  In the history today Mr.  Pratt denies alcohol or illegal drugs and this is the same history as  listed in his history and  physical dictation upon initial admission.   Wayne Pratt is stating that he is from [place], [place].   PAST MEDICAL HISTORY:  Hypertension, diabetes, hyperlipidemia,  peripheral neuropathy, history of disk surgery, elbow surgery,  tonsillectomy.  Please see the above regarding current general medical  care.   He has been on home medications including oxycodone 30 mg q.4 h. p.r.n.  He also has been on Lyrica 300 mg daily.  When asked about psychiatric  diagnoses, he does not recall any.  He also did denies having seen a  psychiatrist in the past.  Please see the above.   LABORATORY DATA:  Sodium 144, BUN 19, creatinine 1.16, glucose 198.  WBC  10.2, hemoglobin 8.6, platelet count 317.  Phosphorus normal.  Magnesium  normal.  On March 28, 2008, today, SGOT 26, SGPT 25, albumin 2.1.   His EKG QTC on March 24, 2008 was 469 milliseconds.   REVIEW OF SYSTEMS:  This is mainly gleaned from the staff, the medical  record, and the electronic medical record.  Wayne Pratt is not an adequate  historian.   Constitutional, head, eyes, ears, nose, throat, mouth, neurologic,  psychiatric, cardiovascular, respiratory, gastrointestinal,  genitourinary, skin,  musculoskeletal, hematologic, lymphatic, endocrine,  metabolic all unremarkable.   EXAMINATION:  VITAL SIGNS:  Temperature 98.5, pulse 80, respiratory rate  25, blood pressure 109/55, O2 saturation on room air 99%.  GENERAL APPEARANCE:  Wayne Pratt is a middle-aged male lying in a supine  position in his hospital bed with no abnormal involuntary movements.  He  is in four-point soft restraints with hand gloves in place.   MENTAL STATUS EXAM:  Wayne Pratt has clouding of consciousness.  He has  poor eye contact.  His attention span is decreased.  Concentration  decreased.  Affect flat.  Mood anxious.  His memory is impaired for  recent and immediate orientation.  Year is correct.  Month is incorrect.  He does not know day of the month or day of the week.  He does not know  place.  He is intact for person.  Fund of knowledge and intelligence are  below that of his estimated premorbid baseline.  His speech is very soft  with a slight dysarthria.  There is poverty of speech.  The prosody is  flat. Thought process:  He gives illogical answers in terms of his  orientation.  Overall thought process does appear to be coherent.  Thought content:  No evidence or disclosure of hallucinations.  He does  not appear to be paranoid and there are no indications of impulses to  harm self or others.   Insight poor.  Judgment impaired.   ASSESSMENT:  AXIS I:  293.00 delirium not otherwise specified.  Etiologies appear to be possible precipitating contributions of Lyrica.  Also, his Cymbalta could be contributing given that it is a serotonin  and norepinephrine reuptake inhibitor with a dosage of 240 mg per day.  Other factors are likely postoperative and having had an empyema with  elevated cytokines, staying in an ICU room, low hemoglobin.  Wayne Pratt  cannot give information on previous psychiatric diagnosis or treatment.  He was admitted on Cymbalta with Seroquel.  Please see the above.  A  provisional  diagnosis at this time will be given, 293.83, mood disorder  not otherwise specified.  AXIS II:  Deferred.  AXIS III:  See past medical history above.  AXIS IV:  General medical, occupational, economic.  AXIS V:  15.  RECOMMENDATIONS:  Concur with the use of Haldol.  Would start a standing  dosage of Haldol 1 mg t.i.d. p.o., IM, or IV slow push and would  continue the p.r.n. dosing but would reduce the p.r.n. to only use if  the Ativan is unsuccessful.   With the standing Haldol dosing as above, that should be adequate for  anti delirium over time.   Utilizing the Ativan for severe agitation while on the standing dosage  of Haldol would allow an overall lower load of neuroleptic, however, if  Ativan is unsuccessful in anti agitation, would then use the Haldol  p.r.n. at the following regimen:  Ativan 1-3 mg p.o. IM or IV q.4 h.  p.r.n., Haldol 2 mg p.o. IM or slow push IV q.4 h. p.r.n. unsuccessful  Ativan, severe agitation.   Would recheck an EKG QTC today.  If greater than 500 milliseconds, would  discontinue the Haldol and reassess once off Haldol.   Given that his QTC was significantly less than 500 milliseconds on March 24, 2008, there is likely still room for adequate Haldol per day as a  standing dose.  If not, Zyprexa can be utilized if the QTC is between  450 and 500, starting at 5 mg p.o. or IM q. 1800.   He may be a candidate for Depakote for severe agitation as the week  proceeds.   Would not discontinue the Seroquel, however, will reduce it down to 50  mg b.i.d. to lower the QTC risk.   As mentioned above, a new QTC will be assessed today and if above 500,  the Seroquel would have to be discontinued in order to reassess the  baseline QTC.   Will decrease the Cymbalta down to 30 mg b.i.d. in order to eliminate  its possible contribution to delirium but prevent withdrawal.   Would continue to monitor for stiffness or other extrapyramidal side  effects of the  Haldol and Seroquel.      Antonietta Breach, M.D.  Electronically Signed     JW/MEDQ  D:  03/29/2008  T:  03/29/2008  Job:  [id number]

## 2010-05-26 NOTE — Assessment & Plan Note (Signed)
OFFICE VISIT   Wayne Pratt, Wayne Pratt  DOB:  09/29/1960                                        April 19, 2008  CHART #:  [id number]   The patient is a 50 year old gentleman who presented with an empyema.  He had a right VATS for drainage of the empyema on March 15.  Postoperatively, his course was uncomplicated, his tubes were able to  come out relatively quickly and he is discharged home.  He states he is  not having any pain.  He has been using a Duragesic patch, which he  takes as he has had chronic neck and back problems, and chronic leg  pain.  He still does have leg pain, but he is not having any pain  related to his incision.  He is not having any difficulty with his  breathing.  He denies any cough, fevers, or sweats.   PHYSICAL EXAMINATION:  GENERAL:  The patient is awake, otherwise well  appearing 50 year old white male in no acute distress.  VITAL SIGNS:  Blood pressure is 151/95, pulse 81, respirations are 18,  and his oxygen saturation is 96% on room air.  LUNGS:  Clear bilaterally.  His incision sites are healing well.   Chest x-ray shows a good post-decortication results with some minimal  pleural scarring at the right base.   IMPRESSION:  The patient is a 50 year old gentleman.  He is now about 3  weeks out from a decortication for an empyema.  He is doing well at this  point in time.  He is having minimal discomfort in his chest.  He still  uses a Duragesic patch for pain in his legs.  He asked about that, but  Dr. Aline August has been managing his pain for him, so I asked him to  continue to do that through Dr. Aline August' office as I will be treating him  longterm for neck and leg issues.  There are no physical restrictions on  him from my standpoint, he can drive, he can go back to any activities  he was doing before the surgery.  He does not need any specific  followup, he was counseled to stop smoking.  All the patient's questions  were answered.  He  will return on a p.r.n. basis.   Wayne Pratt, M.D.  Electronically Signed   SCH/MEDQ  D:  04/19/2008  T:  04/19/2008  Job:  [id number]   cc:   Ramon Dredge L. Juanetta Gosling, M.D.

## 2011-04-26 LAB — COMPREHENSIVE METABOLIC PANEL
Albumin: 4.7
Creat: 1.02
Total Bilirubin: 0.2 mg/dL

## 2011-05-05 ENCOUNTER — Encounter (INDEPENDENT_AMBULATORY_CARE_PROVIDER_SITE_OTHER): Payer: Medicare Other | Admitting: Ophthalmology

## 2011-05-05 DIAGNOSIS — H43819 Vitreous degeneration, unspecified eye: Secondary | ICD-10-CM

## 2011-05-05 DIAGNOSIS — H33309 Unspecified retinal break, unspecified eye: Secondary | ICD-10-CM

## 2011-05-05 DIAGNOSIS — H35039 Hypertensive retinopathy, unspecified eye: Secondary | ICD-10-CM

## 2011-05-05 DIAGNOSIS — E11319 Type 2 diabetes mellitus with unspecified diabetic retinopathy without macular edema: Secondary | ICD-10-CM

## 2011-05-05 DIAGNOSIS — H2510 Age-related nuclear cataract, unspecified eye: Secondary | ICD-10-CM

## 2011-05-05 DIAGNOSIS — D3130 Benign neoplasm of unspecified choroid: Secondary | ICD-10-CM

## 2011-05-19 ENCOUNTER — Ambulatory Visit (INDEPENDENT_AMBULATORY_CARE_PROVIDER_SITE_OTHER): Payer: Medicare Other | Admitting: Ophthalmology

## 2011-05-28 ENCOUNTER — Ambulatory Visit (INDEPENDENT_AMBULATORY_CARE_PROVIDER_SITE_OTHER): Payer: Medicare Other | Admitting: Ophthalmology

## 2011-05-28 DIAGNOSIS — H33309 Unspecified retinal break, unspecified eye: Secondary | ICD-10-CM

## 2011-08-05 ENCOUNTER — Other Ambulatory Visit (HOSPITAL_COMMUNITY): Payer: Self-pay | Admitting: Pediatrics

## 2011-08-05 ENCOUNTER — Ambulatory Visit (HOSPITAL_COMMUNITY)
Admission: RE | Admit: 2011-08-05 | Discharge: 2011-08-05 | Disposition: A | Discharge status: Home / Self Care | Payer: Medicare Other | Source: Ambulatory Visit | Attending: Pediatrics | Admitting: Pediatrics

## 2011-08-05 DIAGNOSIS — R0781 Pleurodynia: Secondary | ICD-10-CM

## 2011-08-05 DIAGNOSIS — E119 Type 2 diabetes mellitus without complications: Secondary | ICD-10-CM | POA: Insufficient documentation

## 2011-08-05 DIAGNOSIS — R079 Chest pain, unspecified: Secondary | ICD-10-CM | POA: Insufficient documentation

## 2011-08-05 DIAGNOSIS — I10 Essential (primary) hypertension: Secondary | ICD-10-CM | POA: Insufficient documentation

## 2011-08-05 DIAGNOSIS — F17200 Nicotine dependence, unspecified, uncomplicated: Secondary | ICD-10-CM | POA: Insufficient documentation

## 2011-08-16 ENCOUNTER — Other Ambulatory Visit (HOSPITAL_COMMUNITY): Payer: Self-pay | Admitting: Pediatrics

## 2011-08-16 DIAGNOSIS — R1011 Right upper quadrant pain: Secondary | ICD-10-CM

## 2011-08-18 ENCOUNTER — Telehealth: Payer: Self-pay | Admitting: Urgent Care

## 2011-08-18 NOTE — Telephone Encounter (Signed)
Pt's wife was calling for patient. They had received a letter from triage nurse. Nurse is aware and will return call to [phone]

## 2011-08-18 NOTE — Telephone Encounter (Signed)
      Gastroenterology Pre-Procedure Form   I retuned call and got info from pt's wife   Request Date: 08/18/2011      Requesting Physician: Dr. Vivia Ewing     PATIENT INFORMATION:  Wayne Pratt is a 51 y.o., male (DOB=11-26-1960).  PROCEDURE: Procedure(s) requested: colonoscopy Procedure Reason: screening for colon cancer  PATIENT REVIEW QUESTIONS: The patient reports the following:   1. Diabetes Melitis: yes  2. Joint replacements in the past 12 months: NO 3. Major health problems in the past 3 months: no 4. Has an artificial valve or MVP:no 5. Has been advised in past to take antibiotics in advance of a procedure like teeth cleaning: no}    MEDICATIONS & ALLERGIES:    Patient reports the following regarding taking any blood thinners:   Plavix? no Aspirin?no Coumadin?  no  Patient confirms/reports the following medications:  No current outpatient prescriptions on file.    Patient confirms/reports the following allergies:  Allergies not on file  Patient is appropriate to schedule for requested procedure(s): Yes  AUTHORIZATION INFORMATION Primary Insurance:   ID #:   Group #:  Pre-Cert / Auth required: Pre-Cert / Auth #:   Secondary Insurance:   ID #:   Group #:  Pre-Cert / Auth required Pre-Cert / Auth #:   No orders of the defined types were placed in this encounter.    SCHEDULE INFORMATION: Procedure has been scheduled as follows:  Date:                    Time: Location: [facility] Short Stay  This Gastroenterology Pre-Precedure Form is being routed to the following provider(s) for review: Jonette Eva, MD

## 2011-08-18 NOTE — Telephone Encounter (Signed)
See triage note of 08/18/2011.

## 2011-08-19 NOTE — Telephone Encounter (Signed)
Gastroenterology Pre-Procedure Form  Triage info from wife/ Debra  Request Date: 08/18/2011     Requesting Physician: Dr. Vivia Ewing     PATIENT INFORMATION:  Wayne Pratt is a 51 y.o., male (DOB=01/17/1960).  PROCEDURE: Procedure(s) requested: colonoscopy Procedure Reason: screening for colon cancer  PATIENT REVIEW QUESTIONS: The patient reports the following:   1. Diabetes Melitis: yes  2. Joint replacements in the past 12 months: no 3. Major health problems in the past 3 months: no 4. Has an artificial valve or MVP:no 5. Has been advised in past to take antibiotics in advance of a procedure like teeth cleaning: no}    MEDICATIONS & ALLERGIES:    Patient reports the following regarding taking any blood thinners:   Plavix? no Aspirin?no Coumadin?  no  Patient confirms/reports the following medications:  Current Outpatient Prescriptions  Medication Sig Dispense Refill  . fentaNYL (DURAGESIC - DOSED MCG/HR) 100 MCG/HR Place 1 patch onto the skin every other day.      . fosinopril-hydrochlorothiazide (MONOPRIL-HCT) 10-12.5 MG per tablet Take 1 tablet by mouth daily.      [name] Kitchen glyBURIDE (DIABETA) 5 MG tablet Take 5 mg by mouth daily with breakfast.      . lovastatin (MEVACOR) 40 MG tablet Take 40 mg by mouth at bedtime.      . metFORMIN (GLUCOPHAGE) 500 MG tablet Take 500 mg by mouth 2 (two) times daily with a meal.      . oxyCODONE (OXYCONTIN) 15 MG TB12 Take 15 mg by mouth every 6 (six) hours.      . pregabalin (LYRICA) 100 MG capsule Take 100 mg by mouth at bedtime.        Patient confirms/reports the following allergies:  Allergies  Allergen Reactions  . Penicillins     Wife said she did not know what kind of reaction she just knew it was a bad one    Patient is appropriate to schedule for requested procedure(s) Yes  AUTHORIZATION INFORMATION Primary Insurance:   ID #:  Group #:  Pre-Cert / Auth required:  Pre-Cert / Auth #:   Secondary Insurance:   ID #:   Group #:    Pre-Cert / Auth required:  Pre-Cert / Auth #:   No orders of the defined types were placed in this encounter.    SCHEDULE INFORMATION: Procedure has been scheduled as follows:  Date:                 Time:  Location:  This Gastroenterology Pre-Precedure Form is being routed to the following provider(s) for review: Jonette Eva, MD

## 2011-08-19 NOTE — Telephone Encounter (Signed)
LMOM to call and schedule OV.  

## 2011-08-19 NOTE — Telephone Encounter (Signed)
Pt needs OPV due to polypharmacy will need TCS WITH PROPOFOL.

## 2011-08-20 ENCOUNTER — Ambulatory Visit (HOSPITAL_COMMUNITY)
Admission: RE | Admit: 2011-08-20 | Discharge: 2011-08-20 | Disposition: A | Discharge status: Home / Self Care | Payer: Medicare Other | Source: Ambulatory Visit | Attending: Pediatrics | Admitting: Pediatrics

## 2011-08-20 DIAGNOSIS — K7689 Other specified diseases of liver: Secondary | ICD-10-CM | POA: Insufficient documentation

## 2011-08-20 DIAGNOSIS — R1011 Right upper quadrant pain: Secondary | ICD-10-CM | POA: Insufficient documentation

## 2011-08-20 NOTE — Telephone Encounter (Signed)
Called pt's wife Stanton Kidney and scheduled appt for OV on 08/25/2011 @ 3:30 PM with Tana Coast, PA.

## 2011-08-25 ENCOUNTER — Ambulatory Visit: Payer: Medicare Other | Admitting: Gastroenterology

## 2011-09-07 ENCOUNTER — Ambulatory Visit (INDEPENDENT_AMBULATORY_CARE_PROVIDER_SITE_OTHER): Payer: Medicare Other | Admitting: Gastroenterology

## 2011-09-07 ENCOUNTER — Encounter: Payer: Self-pay | Admitting: Gastroenterology

## 2011-09-07 VITALS — BP 126/73 | HR 62 | Temp 97.6°F | Ht 73.0 in | Wt 255.4 lb

## 2011-09-07 DIAGNOSIS — K760 Fatty (change of) liver, not elsewhere classified: Secondary | ICD-10-CM | POA: Insufficient documentation

## 2011-09-07 DIAGNOSIS — E119 Type 2 diabetes mellitus without complications: Secondary | ICD-10-CM

## 2011-09-07 DIAGNOSIS — G8929 Other chronic pain: Secondary | ICD-10-CM

## 2011-09-07 DIAGNOSIS — Z1211 Encounter for screening for malignant neoplasm of colon: Secondary | ICD-10-CM | POA: Insufficient documentation

## 2011-09-07 DIAGNOSIS — K7689 Other specified diseases of liver: Secondary | ICD-10-CM

## 2011-09-07 MED ORDER — PEG-KCL-NACL-NASULF-NA ASC-C 100 G PO SOLR: 1.0000 | ORAL | Status: DC

## 2011-09-07 NOTE — Progress Notes (Signed)
Faxed to PCP

## 2011-09-07 NOTE — Progress Notes (Signed)
Primary Care Physician:  Vivia Ewing, MD  Primary Gastroenterologist:  Jonette Eva, MD   Chief Complaint  Patient presents with  . Colonoscopy    HPI:  Wayne Pratt is a 51 y.o. male here to schedule first ever screening colonoscopy. He was sent by Dr. Milford Cage. Because of his chronic narcotic use, he was asked to come in to the office for further determination of what type of sedation he will require. He tells me he's been on chronic pain medication since 2005. He has chronic leg pain from neuropathy. He is very concerned about being completely sedated for the colonoscopy.  He denies any constipation, diarrhea, melena, rectal bleeding, abdominal pain, vomiting, heartburn, dysphagia, unintentional weight loss. He has had some pain underneath right lower ribs for which he recently underwent a chest x-ray and abdominal ultrasound which failed to show cause of the pain. He does have fatty liver, but normal LFTs.  Current Outpatient Prescriptions  Medication Sig Dispense Refill  . cyclobenzaprine (FLEXERIL) 5 MG tablet Take 5 mg by mouth 3 (three) times daily as needed.      . fentaNYL (DURAGESIC - DOSED MCG/HR) 100 MCG/HR Place 1 patch onto the skin every other day.      . fosinopril-hydrochlorothiazide (MONOPRIL-HCT) 10-12.5 MG per tablet Take 1 tablet by mouth daily.      [name] Kitchen glyBURIDE (DIABETA) 5 MG tablet Take 5 mg by mouth daily with breakfast.      . lovastatin (MEVACOR) 40 MG tablet Take 40 mg by mouth at bedtime.      . metFORMIN (GLUCOPHAGE) 500 MG tablet Take 500 mg by mouth 2 (two) times daily with a meal.      . oxyCODONE (OXYCONTIN) 15 MG TB12 Take 15 mg by mouth every 6 (six) hours.      . pregabalin (LYRICA) 100 MG capsule Take 100 mg by mouth at bedtime.        Allergies as of 09/07/2011 - Review Complete 09/07/2011  Allergen Reaction Noted  . Penicillins  08/18/2011    Past Medical History  Diagnosis Date  . Hypertension   . Diabetes mellitus   . Hypercholesterolemia   .  Neuropathy     chronic back pain  . Empyema lung   . Fatty liver     on abd u/s, lfts normal 04/2011  . Kidney stones     22 times    Past Surgical History  Procedure Date  . Elbow surgery   . Tonsillectomy   . Cervical disc surgery   . Lung surgery 2010    for empyema    Family History  Problem Relation Age of Onset  . Colon cancer Neg Hx   . Pancreatic cancer Other     all male members on mother's side    History   Social History  . Marital Status: Married    Spouse Name: N/A    Number of Children: 2  . Years of Education: N/A   Occupational History  . disabled     2005   Social History Main Topics  . Smoking status: Current Everyday Smoker -- 1.5 packs/day    Types: Cigarettes  . Smokeless tobacco: Not on file  . Alcohol Use: No  . Drug Use: No  . Sexually Active: Not on file   Other Topics Concern  . Not on file   Social History Narrative  . No narrative on file      ROS:  General: Negative for anorexia, weight loss,  fever, chills, fatigue, weakness. Eyes: Negative for vision changes.  ENT: Negative for hoarseness, difficulty swallowing , nasal congestion. CV: Negative for chest pain, angina, palpitations, dyspnea on exertion, peripheral edema.  Respiratory: Negative for dyspnea at rest, dyspnea on exertion, cough, sputum, wheezing.  GI: See history of present illness. GU:  Negative for dysuria, hematuria, urinary incontinence, urinary frequency, nocturnal urination.  MS: chronic lower extremity pain. Negative for joint pain, low back pain.  Derm: Negative for rash or itching.  Neuro: Negative for weakness, abnormal sensation, seizure, frequent headaches, memory loss, confusion.  Psych: Negative for anxiety, depression, suicidal ideation, hallucinations.  Endo: Negative for unusual weight change.  Heme: Negative for bruising or bleeding. Allergy: Negative for rash or hives.    Physical Examination:  BP 126/73  Pulse 62  Temp 97.6 F (36.4  C) (Temporal)  Ht 6\' 1"  (1.854 m)  Wt 255 lb 6.4 oz (115.849 kg)  BMI 33.70 kg/m2   General: Well-nourished, well-developed in no acute distress. Accompanied by wife and son. Head: Normocephalic, atraumatic.   Eyes: Conjunctiva pink, no icterus. Mouth: Oropharyngeal mucosa moist and pink , no lesions erythema or exudate. Neck: Supple without thyromegaly, masses, or lymphadenopathy.  Lungs: Clear to auscultation bilaterally.  Heart: Regular rate and rhythm, no murmurs rubs or gallops.  Abdomen: Bowel sounds are normal, nontender, nondistended, no hepatosplenomegaly or masses, no abdominal bruits or    hernia , no rebound or guarding.   Rectal: not performed Extremities: No lower extremity edema. No clubbing or deformities.  Neuro: Alert and oriented x 4 , grossly normal neurologically.  Skin: Warm and dry, no rash or jaundice.   Psych: Alert and cooperative, normal mood and affect.  Labs: Labs from 04/26/2011. Total bilirubin 0.2, alkaline phosphatase 58, AST 15, ALT 15, albumin 4.7, creatinine 1.02   Imaging Studies: US Abdomen Complete  08/20/2011  *RADIOLOGY REPORT*  Clinical Data:  Right upper quadrant pain.  COMPLETE ABDOMINAL ULTRASOUND  Comparison:  CT 03/22/2008  Findings:  Gallbladder:  No gallstones, gallbladder wall thickening, or pericholecystic fluid.  Common bile duct:   Normal caliber, 5 mm  Liver:  Increased echotexture throughout the liver compatible with fatty infiltration.  Area of focal fatty sparing at the gallbladder fossa. No focal lesion or biliary ductal dilatation.  IVC:  Appears normal.  Pancreas:  Not visualized due to overlying bowel gas.  Spleen:  Within normal limits in size and echotexture.  Right Kidney:   Normal in size and parenchymal echogenicity.  No evidence of mass or hydronephrosis.  Left Kidney:  Normal in size and parenchymal echogenicity.  No evidence of mass or hydronephrosis.  Abdominal aorta:  Only visualized proximally due to overlying bowel gas.   No visible aneurysm.  IMPRESSION: Diffuse fatty infiltration of the liver with focal fatty sparing near the gallbladder fossa.  No acute findings.  Original Report Authenticated By: Cyndie Chime, M.D.

## 2011-09-07 NOTE — Assessment & Plan Note (Signed)
Due for first ever screening colonoscopy. Because of chronic narcotic use, would recommend deep sedation.  I have discussed the risks, alternatives, benefits with regards to but not limited to the risk of reaction to medication, bleeding, infection, perforation and the patient is agreeable to proceed. Written consent to be obtained.

## 2011-09-07 NOTE — Patient Instructions (Signed)
We have scheduled you for a colonoscopy with Dr. Darrick Penna. Please see separate instructions. Day of bowel prep, you will need to adjust your metformin and would derive. 4 metformin take only 250 mg twice that day, glyburide take 2.5 mg with breakfast.

## 2011-09-07 NOTE — Assessment & Plan Note (Signed)
Fatty liver based on ultrasound findings. LFTs are normal. Recommend tight glycemic control. Patient does not drink alcohol. Would monitor her LFTs once per year.

## 2011-09-12 HISTORY — PX: ESOPHAGOGASTRODUODENOSCOPY: SHX1529

## 2011-09-20 ENCOUNTER — Encounter (HOSPITAL_COMMUNITY): Payer: Self-pay | Admitting: Pharmacy Technician

## 2011-09-28 ENCOUNTER — Ambulatory Visit (INDEPENDENT_AMBULATORY_CARE_PROVIDER_SITE_OTHER): Payer: Medicare Other | Admitting: Ophthalmology

## 2011-09-28 DIAGNOSIS — H2510 Age-related nuclear cataract, unspecified eye: Secondary | ICD-10-CM

## 2011-09-28 DIAGNOSIS — E1139 Type 2 diabetes mellitus with other diabetic ophthalmic complication: Secondary | ICD-10-CM

## 2011-09-28 DIAGNOSIS — H33309 Unspecified retinal break, unspecified eye: Secondary | ICD-10-CM

## 2011-09-28 DIAGNOSIS — E11319 Type 2 diabetes mellitus with unspecified diabetic retinopathy without macular edema: Secondary | ICD-10-CM

## 2011-09-28 DIAGNOSIS — H43819 Vitreous degeneration, unspecified eye: Secondary | ICD-10-CM

## 2011-09-28 DIAGNOSIS — I10 Essential (primary) hypertension: Secondary | ICD-10-CM

## 2011-09-28 DIAGNOSIS — H35039 Hypertensive retinopathy, unspecified eye: Secondary | ICD-10-CM

## 2011-09-29 ENCOUNTER — Encounter (HOSPITAL_COMMUNITY)
Admission: RE | Admit: 2011-09-29 | Discharge: 2011-09-29 | Disposition: A | Discharge status: Home / Self Care | Payer: Medicare Other | Source: Ambulatory Visit | Attending: Gastroenterology | Admitting: Gastroenterology

## 2011-09-29 ENCOUNTER — Encounter (HOSPITAL_COMMUNITY): Payer: Self-pay

## 2011-09-29 ENCOUNTER — Other Ambulatory Visit: Payer: Self-pay

## 2011-09-29 HISTORY — DX: Sleep apnea, unspecified: G47.30

## 2011-09-29 LAB — BASIC METABOLIC PANEL
GFR calc non Af Amer: 70 mL/min — ABNORMAL LOW (ref >90)
Glucose, Bld: 131 mg/dL — ABNORMAL HIGH (ref 70–99)
Potassium: 4.2 mEq/L (ref 3.5–5.1)
Sodium: 136 mEq/L (ref 135–145)

## 2011-09-29 NOTE — Progress Notes (Signed)
PLEASE CALL PT. HIS HB IS 13. NL FOR A MAN IS 14-16. HE NEEDS TCS TO LOOK FOR POLYPS. DR, Shawnetta Lein DOESN'T FIND A REASON FOR HIS LOW BLOOD COUNT. HE WILL NEED THE UPPER ENDOSCOPY ON THE SAME DAY.  REVIEWED.

## 2011-09-29 NOTE — Progress Notes (Signed)
09/29/11 1110  OBSTRUCTIVE SLEEP APNEA  Have you ever been diagnosed with sleep apnea through a sleep study? No  Do you snore loudly (loud enough to be heard through closed doors)?  1  Do you often feel tired, fatigued, or sleepy during the daytime? 1  Has anyone observed you stop breathing during your sleep? 1  Do you have, or are you being treated for high blood pressure? 1  BMI more than 35 kg/m2? 0  Age over 52 years old? 1  Neck circumference greater than 40 cm/18 inches? 1  Gender: 1  Obstructive Sleep Apnea Score 7   Score 4 or greater  Updated health history;Results sent to PCP

## 2011-09-29 NOTE — Progress Notes (Signed)
Called and informed pt. Routing to Soledad Gerlach to add the possible EGD.

## 2011-09-29 NOTE — Patient Instructions (Signed)
[name]  09/29/2011   Your procedure is scheduled on:  10/05/11  Report to [facility] at 06:15 AM.  Call this number if you have problems the morning of surgery: [phone]   Remember:   Do not eat food or drink:After Midnight.  Take these medicines the morning of surgery with A SIP OF WATER: Fosinopril and Oxycodone  (if needed).   Do not wear jewelry, make-up or nail polish.  Do not wear lotions, powders, or perfumes. You may wear deodorant.  Do not bring valuables to the hospital.  Contacts, dentures or bridgework may not be worn into surgery.  Leave suitcase in the car. After surgery it may be brought to your room.  For patients admitted to the hospital, checkout time is 11:00 AM the day of discharge.   Patients discharged the day of surgery will not be allowed to drive home.  Special Instructions: Use your bowel prep as directed by your doctor.   Please read over the following fact sheets that you were given: Pain Booklet, Anesthesia Post-op Instructions and Care and Recovery After Surgery    Colonoscopy A colonoscopy is an exam to evaluate your entire colon. In this exam, your colon is cleansed. A long fiberoptic tube is inserted through your rectum and into your colon. The fiberoptic scope (endoscope) is a long bundle of enclosed and very flexible fibers. These fibers transmit light to the area examined and send images from that area to your caregiver. Discomfort is usually minimal. You may be given a drug to help you sleep (sedative) during or prior to the procedure. This exam helps to detect lumps (tumors), polyps, inflammation, and areas of bleeding. Your caregiver may also take a small piece of tissue (biopsy) that will be examined under a microscope. LET YOUR CAREGIVER KNOW ABOUT:   Allergies to food or medicine.   Medicines taken, including vitamins, herbs, eyedrops, over-the-counter medicines, and creams.   Use of steroids (by mouth or creams).   Previous problems  with anesthetics or numbing medicines.   History of bleeding problems or blood clots.   Previous surgery.   Other health problems, including diabetes and kidney problems.   Possibility of pregnancy, if this applies.  BEFORE THE PROCEDURE   A clear liquid diet may be required for 2 days before the exam.   Ask your caregiver about changing or stopping your regular medications.   Liquid injections (enemas) or laxatives may be required.   A large amount of electrolyte solution may be given to you to drink over a short period of time. This solution is used to clean out your colon.   You should be present 60 minutes prior to your procedure or as directed by your caregiver.  AFTER THE PROCEDURE   If you received a sedative or pain relieving medication, you will need to arrange for someone to drive you home.   Occasionally, there is a little blood passed with the first bowel movement. Do not be concerned.  FINDING OUT THE RESULTS OF YOUR TEST Not all test results are available during your visit. If your test results are not back during the visit, make an appointment with your caregiver to find out the results. Do not assume everything is normal if you have not heard from your caregiver or the medical facility. It is important for you to follow up on all of your test results. HOME CARE INSTRUCTIONS   It is not unusual to pass moderate amounts of gas and  experience mild abdominal cramping following the procedure. This is due to air being used to inflate your colon during the exam. Walking or a warm pack on your belly (abdomen) may help.   You may resume all normal meals and activities after sedatives and medicines have worn off.   Only take over-the-counter or prescription medicines for pain, discomfort, or fever as directed by your caregiver. Do not use aspirin or blood thinners if a biopsy was taken. Consult your caregiver for medicine usage if biopsies were taken.  SEEK IMMEDIATE MEDICAL  CARE IF:   You have a fever.   You pass large blood clots or fill a toilet with blood following the procedure. This may also occur 10 to 14 days following the procedure. This is more likely if a biopsy was taken.   You develop abdominal pain that keeps getting worse and cannot be relieved with medicine.  Document Released: 12/26/1999 Document Revised: 12/17/2010 Document Reviewed: 08/10/2007 [facility] Patient Information 2012 [place], [place].   PATIENT INSTRUCTIONS POST-ANESTHESIA  IMMEDIATELY FOLLOWING SURGERY:  Do not drive or operate machinery for the first twenty four hours after surgery.  Do not make any important decisions for twenty four hours after surgery or while taking narcotic pain medications or sedatives.  If you develop intractable nausea and vomiting or a severe headache please notify your doctor immediately.  FOLLOW-UP:  Please make an appointment with your surgeon as instructed. You do not need to follow up with anesthesia unless specifically instructed to do so.  WOUND CARE INSTRUCTIONS (if applicable):  Keep a dry clean dressing on the anesthesia/puncture wound site if there is drainage.  Once the wound has quit draining you may leave it open to air.  Generally you should leave the bandage intact for twenty four hours unless there is drainage.  If the epidural site drains for more than 36-48 hours please call the anesthesia department.  QUESTIONS?:  Please feel free to call your physician or the hospital operator if you have any questions, and they will be happy to assist you.

## 2011-09-29 NOTE — Progress Notes (Signed)
Done

## 2011-09-29 NOTE — Progress Notes (Signed)
Please see Dr. Darrick Penna recommendations. Patient's pre-op labs show mild anemia. She wants to add POSSIBLE EGD SAME DAY if colonoscopy is unremarkable.   Please make changes and let patient know plan.

## 2011-09-29 NOTE — Progress Notes (Signed)
I couldn't had the possible EGD on my end because procedure couldn't be canceled and put back in because Pre Op appointment had already been done but I did call over and discuss with Florian Buff and she was going to add Poss EGD on that end

## 2011-09-30 ENCOUNTER — Other Ambulatory Visit: Payer: Self-pay | Admitting: Gastroenterology

## 2011-10-01 ENCOUNTER — Other Ambulatory Visit: Payer: Self-pay | Admitting: Gastroenterology

## 2011-10-05 ENCOUNTER — Encounter (HOSPITAL_COMMUNITY)
Admission: RE | Disposition: A | Discharge status: Home / Self Care | Payer: Self-pay | Source: Ambulatory Visit | Attending: Gastroenterology

## 2011-10-05 ENCOUNTER — Other Ambulatory Visit: Payer: Self-pay | Admitting: Gastroenterology

## 2011-10-05 ENCOUNTER — Ambulatory Visit (HOSPITAL_COMMUNITY): Payer: Medicare Other | Admitting: Anesthesiology

## 2011-10-05 ENCOUNTER — Encounter (HOSPITAL_COMMUNITY): Payer: Self-pay | Admitting: Anesthesiology

## 2011-10-05 ENCOUNTER — Ambulatory Visit (HOSPITAL_COMMUNITY)
Admission: RE | Admit: 2011-10-05 | Discharge: 2011-10-05 | Disposition: A | Discharge status: Home / Self Care | Payer: Medicare Other | Source: Ambulatory Visit | Attending: Gastroenterology | Admitting: Gastroenterology

## 2011-10-05 ENCOUNTER — Encounter (HOSPITAL_COMMUNITY): Payer: Self-pay

## 2011-10-05 DIAGNOSIS — J4489 Other specified chronic obstructive pulmonary disease: Secondary | ICD-10-CM | POA: Insufficient documentation

## 2011-10-05 DIAGNOSIS — K2970 Gastritis, unspecified, without bleeding: Secondary | ICD-10-CM

## 2011-10-05 DIAGNOSIS — I10 Essential (primary) hypertension: Secondary | ICD-10-CM | POA: Insufficient documentation

## 2011-10-05 DIAGNOSIS — E119 Type 2 diabetes mellitus without complications: Secondary | ICD-10-CM | POA: Insufficient documentation

## 2011-10-05 DIAGNOSIS — D649 Anemia, unspecified: Secondary | ICD-10-CM

## 2011-10-05 DIAGNOSIS — Z01812 Encounter for preprocedural laboratory examination: Secondary | ICD-10-CM | POA: Insufficient documentation

## 2011-10-05 DIAGNOSIS — D509 Iron deficiency anemia, unspecified: Secondary | ICD-10-CM | POA: Insufficient documentation

## 2011-10-05 DIAGNOSIS — Z01810 Encounter for preprocedural cardiovascular examination: Secondary | ICD-10-CM | POA: Insufficient documentation

## 2011-10-05 DIAGNOSIS — K2940 Chronic atrophic gastritis without bleeding: Secondary | ICD-10-CM | POA: Insufficient documentation

## 2011-10-05 DIAGNOSIS — G4733 Obstructive sleep apnea (adult) (pediatric): Secondary | ICD-10-CM | POA: Insufficient documentation

## 2011-10-05 DIAGNOSIS — J449 Chronic obstructive pulmonary disease, unspecified: Secondary | ICD-10-CM | POA: Insufficient documentation

## 2011-10-05 DIAGNOSIS — K2990 Gastroduodenitis, unspecified, without bleeding: Secondary | ICD-10-CM

## 2011-10-05 HISTORY — PX: BIOPSY: SHX5522

## 2011-10-05 HISTORY — PX: FLEXIBLE SIGMOIDOSCOPY: SHX5431

## 2011-10-05 SURGERY — COLONOSCOPY WITH PROPOFOL
Anesthesia: Monitor Anesthesia Care | Site: Rectum

## 2011-10-05 MED ORDER — PROPOFOL INFUSION 10 MG/ML OPTIME
INTRAVENOUS | Status: DC | PRN
  Administered 2011-10-05: 75 ug/kg/min via INTRAVENOUS

## 2011-10-05 MED ORDER — GLYCOPYRROLATE 0.2 MG/ML IJ SOLN
INTRAMUSCULAR | Status: AC
  Filled 2011-10-05: qty 1

## 2011-10-05 MED ORDER — ONDANSETRON 8 MG/NS 50 ML IVPB
8.0000 mg | Freq: Once | INTRAVENOUS | Status: AC
  Administered 2011-10-05: 8 mg via INTRAVENOUS
  Filled 2011-10-05: qty 8

## 2011-10-05 MED ORDER — WATER FOR IRRIGATION, STERILE IR SOLN
Status: DC | PRN
  Administered 2011-10-05: 1000 mL

## 2011-10-05 MED ORDER — LIDOCAINE HCL (PF) 1 % IJ SOLN
INTRAMUSCULAR | Status: AC
  Filled 2011-10-05: qty 5

## 2011-10-05 MED ORDER — STERILE WATER FOR IRRIGATION IR SOLN
Status: DC | PRN
  Administered 2011-10-05: 08:00:00

## 2011-10-05 MED ORDER — MIDAZOLAM HCL 2 MG/2ML IJ SOLN
1.0000 mg | INTRAMUSCULAR | Status: DC | PRN
  Administered 2011-10-05 (×2): 2 mg via INTRAVENOUS

## 2011-10-05 MED ORDER — FENTANYL CITRATE 0.05 MG/ML IJ SOLN
INTRAMUSCULAR | Status: AC
  Filled 2011-10-05: qty 2

## 2011-10-05 MED ORDER — FENTANYL CITRATE 0.05 MG/ML IJ SOLN
INTRAMUSCULAR | Status: DC | PRN
  Administered 2011-10-05 (×2): 50 ug via INTRAVENOUS

## 2011-10-05 MED ORDER — MIDAZOLAM HCL 2 MG/2ML IJ SOLN
INTRAMUSCULAR | Status: AC
  Filled 2011-10-05: qty 2

## 2011-10-05 MED ORDER — FENTANYL CITRATE 0.05 MG/ML IJ SOLN: 25.0000 ug | INTRAMUSCULAR | Status: DC | PRN

## 2011-10-05 MED ORDER — OMEPRAZOLE 20 MG PO CPDR: DELAYED_RELEASE_CAPSULE | ORAL | Status: DC

## 2011-10-05 MED ORDER — ONDANSETRON HCL 4 MG/2ML IJ SOLN
INTRAMUSCULAR | Status: AC
  Filled 2011-10-05: qty 4

## 2011-10-05 MED ORDER — LACTATED RINGERS IV SOLN
INTRAVENOUS | Status: DC
  Administered 2011-10-05: 07:00:00 via INTRAVENOUS

## 2011-10-05 MED ORDER — PROPOFOL 10 MG/ML IV BOLUS
INTRAVENOUS | Status: DC | PRN
  Administered 2011-10-05 (×2): 10 mg via INTRAVENOUS

## 2011-10-05 MED ORDER — GLYCOPYRROLATE 0.2 MG/ML IJ SOLN
0.2000 mg | Freq: Once | INTRAMUSCULAR | Status: AC
  Administered 2011-10-05: 0.2 mg via INTRAVENOUS

## 2011-10-05 MED ORDER — PROPOFOL 10 MG/ML IV EMUL
INTRAVENOUS | Status: AC
  Filled 2011-10-05: qty 20

## 2011-10-05 MED ORDER — ONDANSETRON HCL 4 MG/2ML IJ SOLN: 4.0000 mg | Freq: Once | INTRAMUSCULAR | Status: DC | PRN

## 2011-10-05 SURGICAL SUPPLY — 23 items
BLOCK BITE 60FR ADLT L/F BLUE (MISCELLANEOUS) ×1 IMPLANT
ELECT REM PT RETURN 9FT ADLT (ELECTROSURGICAL)
ELECTRODE REM PT RTRN 9FT ADLT (ELECTROSURGICAL) IMPLANT
FCP BXJMBJMB 240X2.8X (CUTTING FORCEPS)
FLOOR PAD 36X40 (MISCELLANEOUS) ×3
FORCEPS BIOP RAD 4 LRG CAP 4 (CUTTING FORCEPS) ×1 IMPLANT
FORCEPS BIOP RJ4 240 W/NDL (CUTTING FORCEPS)
FORCEPS BXJMBJMB 240X2.8X (CUTTING FORCEPS) IMPLANT
INJECTOR/SNARE I SNARE (MISCELLANEOUS) IMPLANT
LUBRICANT JELLY 4.5OZ STERILE (MISCELLANEOUS) ×1 IMPLANT
MANIFOLD NEPTUNE II (INSTRUMENTS) ×3 IMPLANT
NDL SCLEROTHERAPY 25GX240 (NEEDLE) IMPLANT
NEEDLE SCLEROTHERAPY 25GX240 (NEEDLE) IMPLANT
PAD FLOOR 36X40 (MISCELLANEOUS) ×2 IMPLANT
PROBE APC STR FIRE (PROBE) IMPLANT
PROBE INJECTION GOLD (MISCELLANEOUS)
PROBE INJECTION GOLD 7FR (MISCELLANEOUS) IMPLANT
SNARE SHORT THROW 13M SML OVAL (MISCELLANEOUS) ×2 IMPLANT
SYR 50ML LL SCALE MARK (SYRINGE) ×1 IMPLANT
TRAP SPECIMEN MUCOUS 40CC (MISCELLANEOUS) IMPLANT
TUBING ENDO SMARTCAP PENTAX (MISCELLANEOUS) ×1 IMPLANT
TUBING IRRIGATION ENDOGATOR (MISCELLANEOUS) ×1 IMPLANT
WATER STERILE IRR 1000ML POUR (IV SOLUTION) ×1 IMPLANT

## 2011-10-05 NOTE — H&P (Signed)
Primary Care Physician:  Vivia Ewing, MD Primary Gastroenterologist:  Dr. Darrick Penna  Pre-Procedure History & Physical: HPI:  Wayne Pratt is a 51 y.o. male here for  ANEMIA.  Past Medical History  Diagnosis Date  . Hypertension   . Diabetes mellitus   . Hypercholesterolemia   . Neuropathy     chronic back pain  . Empyema lung   . Fatty liver     on abd u/s, lfts normal 04/2011  . Kidney stones     22 times  . Sleep apnea     Stop Bang score of 7    Past Surgical History  Procedure Date  . Elbow surgery   . Tonsillectomy   . Cervical disc surgery   . Lung surgery 2010    for empyema    Prior to Admission medications   Medication Sig Start Date End Date Taking? Authorizing Provider  cyclobenzaprine (FLEXERIL) 5 MG tablet Take 5 mg by mouth 3 (three) times daily as needed. Muscle Spasms   Yes Historical Provider, MD  fentaNYL (DURAGESIC - DOSED MCG/HR) 100 MCG/HR Place 2 patches onto the skin every other day.    Yes Historical Provider, MD  fosinopril (MONOPRIL) 10 MG tablet Take 10 mg by mouth Daily. 08/17/11  Yes Historical Provider, MD  glyBURIDE (DIABETA) 5 MG tablet Take 5 mg by mouth daily with breakfast.   Yes Historical Provider, MD  hydrochlorothiazide (MICROZIDE) 12.5 MG capsule Take 12.5 mg by mouth Daily. 08/17/11  Yes Historical Provider, MD  lovastatin (MEVACOR) 40 MG tablet Take 40 mg by mouth at bedtime.   Yes Historical Provider, MD  metFORMIN (GLUCOPHAGE) 500 MG tablet Take 500 mg by mouth 2 (two) times daily with a meal.   Yes Historical Provider, MD  oxyCODONE (OXYCONTIN) 15 MG TB12 Take 15 mg by mouth every 4 (four) hours as needed. Pain   Yes Historical Provider, MD  peg 3350 powder (MOVIPREP) 100 G SOLR Take 1 kit (100 g total) by mouth as directed. 09/07/11  Yes West Bali, MD  pregabalin (LYRICA) 100 MG capsule Take 100 mg by mouth at bedtime.   Yes Historical Provider, MD    Allergies as of 09/07/2011 - Review Complete 09/07/2011  Allergen Reaction  Noted  . Penicillins  08/18/2011    Family History  Problem Relation Age of Onset  . Colon cancer Neg Hx   . Pancreatic cancer Other     all male members on mother's side    History   Social History  . Marital Status: Married    Spouse Name: N/A    Number of Children: 2  . Years of Education: N/A   Occupational History  . disabled     2005   Social History Main Topics  . Smoking status: Current Every Day Smoker -- 1.7 packs/day for 30 years    Types: Cigarettes  . Smokeless tobacco: Not on file  . Alcohol Use: No  . Drug Use: No  . Sexually Active: Not on file   Other Topics Concern  . Not on file   Social History Narrative  . No narrative on file    Review of Systems: See HPI, otherwise negative ROS   Physical Exam: BP 145/99  Pulse 78  Temp 98 F (36.7 C) (Oral)  Resp 10  SpO2 99% General:   Alert,  pleasant and cooperative in NAD Head:  Normocephalic and atraumatic. Neck:  Supple; Lungs:  Clear throughout to auscultation.    Heart:  Regular rate and rhythm. Abdomen:  Soft, nontender and nondistended. Normal bowel sounds, without guarding, and without rebound.   Neurologic:  Alert and  oriented x4;  grossly normal neurologically.  Impression/Plan:     Anemia  PLAN:  1. TCS/?EGD TODAY

## 2011-10-05 NOTE — Transfer of Care (Signed)
Immediate Anesthesia Transfer of Care Note  Patient: Wayne Pratt  Procedure(s) Performed: Procedure(s) (LRB) with comments: COLONOSCOPY WITH PROPOFOL (N/A) - attempted colon,poor prep, done at 0808 ESOPHAGOGASTRODUODENOSCOPY (EGD) WITH PROPOFOL (N/A) - started at 0814 FLEXIBLE SIGMOIDOSCOPY (N/A)  Patient Location: PACU  Anesthesia Type: MAC  Level of Consciousness: awake  Airway & Oxygen Therapy: Patient Spontanous Breathing  Post-op Assessment: Report given to PACU RN  Post vital signs: Reviewed  Complications: No apparent anesthesia complications

## 2011-10-05 NOTE — Anesthesia Preprocedure Evaluation (Signed)
Anesthesia Evaluation  Patient identified by MRN, date of birth, ID band Patient awake    Reviewed: Allergy & Precautions, H&P , NPO status , Patient's Chart, lab work & pertinent test results  History of Anesthesia Complications Negative for: history of anesthetic complications  Airway Mallampati: I TM Distance: >3 FB Neck ROM: Full    Dental  (+) Edentulous Upper and Edentulous Lower   Pulmonary sleep apnea , COPDCurrent Smoker,  breath sounds clear to auscultation        Cardiovascular hypertension, Pt. on medications Rhythm:Regular Rate:Normal     Neuro/Psych Anxiety    GI/Hepatic   Endo/Other  diabetes, Well Controlled, Type 2, Oral Hypoglycemic Agents  Renal/GU Renal disease     Musculoskeletal   Abdominal   Peds  Hematology   Anesthesia Other Findings   Reproductive/Obstetrics                           Anesthesia Physical Anesthesia Plan  ASA: III  Anesthesia Plan: MAC   Post-op Pain Management:    Induction: Intravenous  Airway Management Planned: Simple Face Mask  Additional Equipment:   Intra-op Plan:   Post-operative Plan:   Informed Consent: I have reviewed the patients History and Physical, chart, labs and discussed the procedure including the risks, benefits and alternatives for the proposed anesthesia with the patient or authorized representative who has indicated his/her understanding and acceptance.     Plan Discussed with:   Anesthesia Plan Comments:         Anesthesia Quick Evaluation

## 2011-10-05 NOTE — Anesthesia Postprocedure Evaluation (Signed)
  Anesthesia Post-op Note  Patient: Wayne Pratt  Procedure(s) Performed: Procedure(s) (LRB) with comments: COLONOSCOPY WITH PROPOFOL (N/A) - attempted colon,poor prep, done at 0808 ESOPHAGOGASTRODUODENOSCOPY (EGD) WITH PROPOFOL (N/A) - started at 0814 FLEXIBLE SIGMOIDOSCOPY (N/A)  Patient Location: PACU  Anesthesia Type: MAC  Level of Consciousness: awake, alert  and oriented  Airway and Oxygen Therapy: Patient Spontanous Breathing  Post-op Pain: none  Post-op Assessment: Post-op Vital signs reviewed, Patient's Cardiovascular Status Stable, Respiratory Function Stable, Patent Airway and No signs of Nausea or vomiting  Post-op Vital Signs: Reviewed and stable  Complications: No apparent anesthesia complications

## 2011-10-05 NOTE — Op Note (Signed)
[facility] [address] [place], [address]   ENDOSCOPY PROCEDURE REPORT  PATIENT: Wayne, Pratt  MR#: [id number] BIRTHDATE: 1960/01/22 , 50  yrs. old GENDER: Male  ENDOSCOPIST: Jonette Eva, MD REFERRED AO:[id number] Milford Cage, M.D.  PROCEDURE DATE: 10/05/2011 PROCEDURE:   EGD w/ biopsy  INDICATIONS:anemia.  HB 24 Sep 2011 MEDICATIONS: MAC sedation, administered by CRNA TOPICAL ANESTHETIC:   Cetacaine Spray  DESCRIPTION OF PROCEDURE:     Physical exam was performed.  Informed consent was obtained from the patient after explaining the benefits, risks, and alternatives to the procedure.  The patient was connected to the monitor and placed in the left lateral position.  Continuous oxygen was provided by nasal cannula and IV medicine administered through an indwelling cannula.  After administration of sedation, the patients esophagus was intubated and the     endoscope was advanced under direct visualization to the second portion of the duodenum.  The scope was removed slowly by carefully examining the color, texture, anatomy, and integrity of the mucosa on the way out.  The patient was recovered in endoscopy and discharged home in satisfactory condition.      ESOPHAGUS: The mucosa of the esophagus appeared normal.  STOMACH: Non-erosive gastritis (inflammation) was found in the gastric antrum.  Multiple biopsies were performed using cold forceps.  DUODENUM: The duodenal mucosa showed no abnormalities.  COMPLICATIONS:   None  ENDOSCOPIC IMPRESSION: 1.   The mucosa of the esophagus appeared normal 2.   Non-erosive gastritis (inflammation) was found in the gastric antrum; multiple biopsies 3.   The duodenal mucosa showed no abnormalities  RECOMMENDATIONS: START OMEPRAOLE EVERY MORNING & CONTINUE FOREVER.  AVOID ITEMS THAT TRIGGER GASTRITIS.  SEE INFO BELOW.  FOLLOW A HIGH FIBER/LOW FAT DIET.  AVOID ITEMS THAT CAUSE BLOATING.   LOSE 10 TO 20 LBS to HELP  REFLUX BE BETTER CONTROLLED AND DECREASE RISK FOR COLON CANCER.  BIOPSY RESULTS WILL BE BACK IN 7 DAYS.  FOLLOW UP IN 4 MOS.   REPEAT EXAM:   _______________________________ Rosalie DoctorJonette Eva, MD 10/05/2011 1:37 PM       PATIENT NAME:  Wayne, Pratt MR#: [id number]

## 2011-10-05 NOTE — Op Note (Signed)
[facility] [address] [place], [address]   FLEXIBLE SIGMOIDOSCOPY PROCEDURE REPORT  PATIENT: Wayne, Pratt  MR#: [id number] BIRTHDATE: February 08, 1960 , 50  yrs. old GENDER: Male ENDOSCOPIST: Jonette Eva, MD REFERRED BY: Vivia Ewing, M.D. PROCEDURE DATE:  10/05/2011 PROCEDURE:   Sigmoidoscopy, screening ASA CLASS: INDICATIONS:anemia, non-specific. PT UNABLE TO TOLERATE LARGE VOLUME OF POLYETHYLENE GLYCOL DUE TO NAUSEA AND VOMITING.  MEDICATIONS: MAC sedation, administered by CRNA  DESCRIPTION OF PROCEDURE:   After the risks benefits and alternatives of the procedure were thoroughly explained, informed consent was obtained.  revealed no abnormalities of the rectum. The endoscope was introduced through the anus  and advanced to the sigmoid colon , limited by No adverse events experienced.   The quality of the prep was poor .  The instrument was then slowly withdrawn as the mucosa was fully examined.     1.  COLON FINDINGS: There was no evidence of mass.  POOR PREP LIMITED EXAM. 2.  There was no evidence of mass.  POOR PREP LIMITED EXAM. Retroflexion was not performed.    The scope was then withdrawn from the patient and the procedure terminated.  COMPLICATIONS: There were no complications.  ENDOSCOPIC IMPRESSION: 1.   There was no evidence of mass.  POOR PREP LIMITED EXAM 2.   There was no evidence of mass.  POOR PREP LIMITED EXAM  RECOMMENDATIONS: colonoscopy IN 2-3 WEEKS   REPEAT EXAM:   _______________________________ Rosalie DoctorJonette Eva, MD 10/05/2011 1:32 PM   CC:

## 2011-10-08 ENCOUNTER — Encounter (HOSPITAL_COMMUNITY): Payer: Self-pay | Admitting: Gastroenterology

## 2011-10-12 ENCOUNTER — Telehealth: Payer: Self-pay | Admitting: Gastroenterology

## 2011-10-12 NOTE — Telephone Encounter (Signed)
Please call pt. His stomach Bx shows gastritis.    TAKE OMEPRAOLE EVERY MORNING & CONTINUE FOREVER.  AVOID ITEMS THAT TRIGGER GASTRITIS.   FOLLOW A HIGH FIBER/LOW FAT DIET. AVOID ITEMS THAT CAUSE BLOATING.   LOSE 10 TO 20 LBS.   COLONOSCOPY WITHIN THE NEXT 2-3 WEEKS WITH PROPOFOL AND MIRALAX PREP.  FOLLOW UP IN 4 MOS.

## 2011-10-12 NOTE — Telephone Encounter (Signed)
Results faxed to PCP 

## 2011-10-12 NOTE — Telephone Encounter (Signed)
Reminder in epic to follow up in 4 months °

## 2011-10-12 NOTE — Telephone Encounter (Signed)
TCS is scheduled for OCT 15th with Miralax prep and patient is aware

## 2011-10-13 NOTE — Telephone Encounter (Signed)
Called and informed pt of his results.

## 2011-10-14 ENCOUNTER — Encounter (HOSPITAL_COMMUNITY): Payer: Self-pay | Admitting: *Deleted

## 2011-10-14 ENCOUNTER — Emergency Department (HOSPITAL_COMMUNITY)
Admission: EM | Admit: 2011-10-14 | Discharge: 2011-10-15 | Disposition: A | Discharge status: Home / Self Care | Payer: Medicare Other | Attending: Emergency Medicine | Admitting: Emergency Medicine

## 2011-10-14 ENCOUNTER — Emergency Department (HOSPITAL_COMMUNITY): Payer: Medicare Other

## 2011-10-14 DIAGNOSIS — Y9210 Unspecified residential institution as the place of occurrence of the external cause: Secondary | ICD-10-CM | POA: Insufficient documentation

## 2011-10-14 DIAGNOSIS — F17200 Nicotine dependence, unspecified, uncomplicated: Secondary | ICD-10-CM | POA: Insufficient documentation

## 2011-10-14 DIAGNOSIS — E119 Type 2 diabetes mellitus without complications: Secondary | ICD-10-CM | POA: Insufficient documentation

## 2011-10-14 DIAGNOSIS — I10 Essential (primary) hypertension: Secondary | ICD-10-CM | POA: Insufficient documentation

## 2011-10-14 DIAGNOSIS — E7800 Pure hypercholesterolemia, unspecified: Secondary | ICD-10-CM | POA: Insufficient documentation

## 2011-10-14 DIAGNOSIS — M25579 Pain in unspecified ankle and joints of unspecified foot: Secondary | ICD-10-CM | POA: Insufficient documentation

## 2011-10-14 DIAGNOSIS — IMO0002 Reserved for concepts with insufficient information to code with codable children: Secondary | ICD-10-CM | POA: Insufficient documentation

## 2011-10-14 DIAGNOSIS — Z888 Allergy status to other drugs, medicaments and biological substances status: Secondary | ICD-10-CM | POA: Insufficient documentation

## 2011-10-14 DIAGNOSIS — S93401A Sprain of unspecified ligament of right ankle, initial encounter: Secondary | ICD-10-CM

## 2011-10-14 NOTE — ED Notes (Addendum)
Rt ankle pain, tripped over his dog and injured rt ankle

## 2011-10-14 NOTE — ED Provider Notes (Signed)
History   This chart was scribed for Donnetta Hutching, MD by Gerlean Ren. This patient was seen in room APA15/APA15 and the patient's care was started at 23:38.   CSN: [id number]  Arrival date & time 10/14/11  2144   First MD Initiated Contact with Patient 10/14/11 2306      Chief Complaint  Patient presents with  . Ankle Pain    (Consider location/radiation/quality/duration/timing/severity/associated sxs/prior treatment) The history is provided by the patient. No language interpreter was used.   Wayne Pratt is a 51 y.o. male who presents to the Emergency Department complaining of gradually worsening constant, throbbing, non-radiating right ankle pain after collision with dog while entering house.  Pt reports pain is worsened when bearing weight and when extending foot.  Pt denies falling as result of collision, with no other injuries sustained.  Pt has h/o HTN and DM.  Pt is a current everyday smoker but denies alcohol use.   Past Medical History  Diagnosis Date  . Hypertension   . Diabetes mellitus   . Hypercholesterolemia   . Neuropathy     chronic back pain  . Empyema lung   . Fatty liver     on abd u/s, lfts normal 04/2011  . Sleep apnea     Stop Bang score of 7  . Kidney stones     22 times    Past Surgical History  Procedure Date  . Elbow surgery   . Tonsillectomy   . Cervical disc surgery   . Lung surgery 2010    for empyema  . Flexible sigmoidoscopy 10/05/2011    Procedure: FLEXIBLE SIGMOIDOSCOPY;  Surgeon: West Bali, MD;  Location: [facility];  Service: Endoscopy;  Laterality: N/A;  . Esophageal biopsy 10/05/2011    Procedure: BIOPSY;  Surgeon: West Bali, MD;  Location: [facility];  Service: Endoscopy;  Laterality: N/A;    Family History  Problem Relation Age of Onset  . Colon cancer Neg Hx   . Pancreatic cancer Other     all male members on mother's side    History  Substance Use Topics  . Smoking status: Current Every Day Smoker -- 1.7 packs/day for 30  years    Types: Cigarettes  . Smokeless tobacco: Not on file  . Alcohol Use: No      Review of Systems  All other systems reviewed and are negative.    Allergies  Penicillins and Ativan  Home Medications   Current Outpatient Rx  Name Route Sig Dispense Refill  . FENTANYL 100 MCG/HR TD PT72 Transdermal Place 1 patch onto the skin every other day.     . FOSINOPRIL SODIUM 10 MG PO TABS Oral Take 10 mg by mouth every morning.     [name] Kitchen GLYBURIDE 5 MG PO TABS Oral Take 5 mg by mouth daily with breakfast.    . HYDROCHLOROTHIAZIDE 12.5 MG PO CAPS Oral Take 12.5 mg by mouth every morning.     [name] Kitchen LOVASTATIN 40 MG PO TABS Oral Take 40 mg by mouth at bedtime.    [name] Kitchen METFORMIN HCL 500 MG PO TABS Oral Take 500 mg by mouth 2 (two) times daily with a meal.    . OMEPRAZOLE 20 MG PO CPDR  1 po every morning 30 minutes prior to your first meal. 31 capsule 11  . OXYCODONE HCL 15 MG PO TABS Oral Take 1 tablet by mouth Every 4 hours as needed.    [name] Kitchen PREGABALIN 100 MG PO CAPS Oral Take  100 mg by mouth at bedtime.      BP 116/68  Pulse 87  Temp 98.5 F (36.9 C) (Oral)  Resp 18  Ht 6\' 1"  (1.854 m)  Wt 248 lb (112.492 kg)  BMI 32.72 kg/m2  SpO2 97%  Physical Exam  Nursing note and vitals reviewed. Constitutional: He is oriented to person, place, and time. He appears well-developed and well-nourished.  HENT:  Head: Normocephalic and atraumatic.  Neck: No tracheal deviation present.  Musculoskeletal: Normal range of motion.       Tenderness over anterior aspect of right ankle.  Right ankle extension positive for pain.  Right ankle inversion and eversion negative for pain.    Neurological: He is alert and oriented to person, place, and time.  Skin: Skin is warm and dry.  Psychiatric: He has a normal mood and affect.    ED Course  Procedures (including critical care time) DIAGNOSTIC STUDIES: Oxygen Saturation is 97% on room air, adequate by my interpretation.    COORDINATION OF CARE: 23:43-  Informed pt of negative ankle XR.  Discussed ASO for ankle.  Discussed discharge.   Labs Reviewed - No data to display Dg Ankle Complete Right  10/14/2011  *RADIOLOGY REPORT*  Clinical Data: Fall with ankle pain  RIGHT ANKLE - COMPLETE 3+ VIEW  Comparison: None.  Findings: No fracture.  The ankle mortise is normally spaced and aligned.  There is spurring from the anterior articular margin of the distal tibia.  There are also small calcaneal spurs and spurring from the talonavicular joint dorsally.  Mild soft tissue swelling is evident.  IMPRESSION: No fracture or dislocation   Original Report Authenticated By: Domenic Moras, M.D.      No diagnosis found.    MDM   Right ankle x-ray negative. Rest, ice, elevate, compress      I personally performed the services described in this documentation, which was scribed in my presence. The recorded information has been reviewed and considered.         Donnetta Hutching, MD 10/15/11 [phone]

## 2011-10-18 ENCOUNTER — Encounter (HOSPITAL_COMMUNITY): Payer: Self-pay | Admitting: Pharmacy Technician

## 2011-10-18 NOTE — Patient Instructions (Addendum)
[name]  10/18/2011   Your procedure is scheduled on:  10/26/11  Report to [facility] at 0700 AM.  Call this number if you have problems the morning of surgery: [phone]   Remember:   Do not eat food:After Midnight.  May have clear liquids:until Midnight .  Clear liquids include soda, tea, black coffee, apple or grape juice, broth.  Take these medicines the morning of surgery with A SIP OF WATER: prilosec, lyrica, fosinopril   Do not wear jewelry, make-up or nail polish.  Do not wear lotions, powders, or perfumes. You may wear deodorant.  Do not shave 48 hours prior to surgery. Men may shave face and neck.  Do not bring valuables to the hospital.  Contacts, dentures or bridgework may not be worn into surgery.  Leave suitcase in the car. After surgery it may be brought to your room.  For patients admitted to the hospital, checkout time is 11:00 AM the day of discharge.   Patients discharged the day of surgery will not be allowed to drive home.  Name and phone number of your driver: family  Special Instructions: N/A   Please read over the following fact sheets that you were given: Anesthesia Post-op Instructions and Care and Recovery After Surgery   PATIENT INSTRUCTIONS POST-ANESTHESIA  IMMEDIATELY FOLLOWING SURGERY:  Do not drive or operate machinery for the first twenty four hours after surgery.  Do not make any important decisions for twenty four hours after surgery or while taking narcotic pain medications or sedatives.  If you develop intractable nausea and vomiting or a severe headache please notify your doctor immediately.  FOLLOW-UP:  Please make an appointment with your surgeon as instructed. You do not need to follow up with anesthesia unless specifically instructed to do so.  WOUND CARE INSTRUCTIONS (if applicable):  Keep a dry clean dressing on the anesthesia/puncture wound site if there is drainage.  Once the wound has quit draining you may leave it open to air.   Generally you should leave the bandage intact for twenty four hours unless there is drainage.  If the epidural site drains for more than 36-48 hours please call the anesthesia department.  QUESTIONS?:  Please feel free to call your physician or the hospital operator if you have any questions, and they will be happy to assist you.      Colonoscopy A colonoscopy is an exam to evaluate your entire colon. In this exam, your colon is cleansed. A long fiberoptic tube is inserted through your rectum and into your colon. The fiberoptic scope (endoscope) is a long bundle of enclosed and very flexible fibers. These fibers transmit light to the area examined and send images from that area to your caregiver. Discomfort is usually minimal. You may be given a drug to help you sleep (sedative) during or prior to the procedure. This exam helps to detect lumps (tumors), polyps, inflammation, and areas of bleeding. Your caregiver may also take a small piece of tissue (biopsy) that will be examined under a microscope. LET YOUR CAREGIVER KNOW ABOUT:   Allergies to food or medicine.  Medicines taken, including vitamins, herbs, eyedrops, over-the-counter medicines, and creams.  Use of steroids (by mouth or creams).  Previous problems with anesthetics or numbing medicines.  History of bleeding problems or blood clots.  Previous surgery.  Other health problems, including diabetes and kidney problems.  Possibility of pregnancy, if this applies. BEFORE THE PROCEDURE   A clear liquid diet may be required  for 2 days before the exam.  Ask your caregiver about changing or stopping your regular medications.  Liquid injections (enemas) or laxatives may be required.  A large amount of electrolyte solution may be given to you to drink over a short period of time. This solution is used to clean out your colon.  You should be present 60 minutes prior to your procedure or as directed by your caregiver. AFTER THE  PROCEDURE   If you received a sedative or pain relieving medication, you will need to arrange for someone to drive you home.  Occasionally, there is a little blood passed with the first bowel movement. Do not be concerned. FINDING OUT THE RESULTS OF YOUR TEST Not all test results are available during your visit. If your test results are not back during the visit, make an appointment with your caregiver to find out the results. Do not assume everything is normal if you have not heard from your caregiver or the medical facility. It is important for you to follow up on all of your test results. HOME CARE INSTRUCTIONS   It is not unusual to pass moderate amounts of gas and experience mild abdominal cramping following the procedure. This is due to air being used to inflate your colon during the exam. Walking or a warm pack on your belly (abdomen) may help.  You may resume all normal meals and activities after sedatives and medicines have worn off.  Only take over-the-counter or prescription medicines for pain, discomfort, or fever as directed by your caregiver. Do not use aspirin or blood thinners if a biopsy was taken. Consult your caregiver for medicine usage if biopsies were taken. SEEK IMMEDIATE MEDICAL CARE IF:   You have a fever.  You pass large blood clots or fill a toilet with blood following the procedure. This may also occur 10 to 14 days following the procedure. This is more likely if a biopsy was taken.  You develop abdominal pain that keeps getting worse and cannot be relieved with medicine. Document Released: 12/26/1999 Document Revised: 03/22/2011 Document Reviewed: 08/10/2007 [facility] Patient Information 2013 [place], [place].

## 2011-10-19 ENCOUNTER — Inpatient Hospital Stay (HOSPITAL_COMMUNITY): Admission: RE | Admit: 2011-10-19 | Discharge: 2011-10-19 | Payer: Medicare Other | Source: Ambulatory Visit

## 2011-10-19 ENCOUNTER — Encounter (HOSPITAL_COMMUNITY): Payer: Self-pay

## 2011-10-25 ENCOUNTER — Telehealth: Payer: Self-pay

## 2011-10-25 NOTE — Telephone Encounter (Signed)
PLEASE CALL PT.  THERE IS NO ALTERNATIVE PREP. IF HE CANNOT TAKE THE MIRALAX PREP. HE NEEDS TO CANCEL HIS PROCEDURE.

## 2011-10-25 NOTE — Telephone Encounter (Signed)
Pt called and said he only did 1/2 of the miralax prep that he was supposed to do yesterday. He is trying today, but just does not feel that he can get the amount down. He is scheduled for tomorrow. Please advise!

## 2011-10-25 NOTE — Telephone Encounter (Signed)
Called pt. Informed of what Dr. Darrick Penna said. He said to just cancel the procedure that is scheduled for 10/26/2011. I took off of the schedule and LMOM for Selena Batten that he has cancelled. Routing to [employer] for [employer].

## 2011-10-26 ENCOUNTER — Encounter (HOSPITAL_COMMUNITY): Admission: RE | Payer: Self-pay | Source: Ambulatory Visit

## 2011-10-26 ENCOUNTER — Ambulatory Visit (HOSPITAL_COMMUNITY): Admission: RE | Admit: 2011-10-26 | Payer: Medicare Other | Source: Ambulatory Visit | Admitting: Gastroenterology

## 2011-10-26 SURGERY — COLONOSCOPY WITH PROPOFOL
Anesthesia: Monitor Anesthesia Care

## 2011-12-30 ENCOUNTER — Encounter: Payer: Self-pay | Admitting: *Deleted

## 2012-08-17 ENCOUNTER — Other Ambulatory Visit (HOSPITAL_COMMUNITY): Payer: Self-pay | Admitting: Internal Medicine

## 2012-08-17 ENCOUNTER — Ambulatory Visit (HOSPITAL_COMMUNITY)
Admission: RE | Admit: 2012-08-17 | Discharge: 2012-08-17 | Disposition: A | Discharge status: Home / Self Care | Payer: Medicare Other | Source: Ambulatory Visit | Attending: Internal Medicine | Admitting: Internal Medicine

## 2012-08-17 DIAGNOSIS — Z87891 Personal history of nicotine dependence: Secondary | ICD-10-CM | POA: Insufficient documentation

## 2012-08-17 DIAGNOSIS — R079 Chest pain, unspecified: Secondary | ICD-10-CM

## 2012-08-17 DIAGNOSIS — J438 Other emphysema: Secondary | ICD-10-CM | POA: Insufficient documentation

## 2012-08-17 DIAGNOSIS — I10 Essential (primary) hypertension: Secondary | ICD-10-CM | POA: Insufficient documentation

## 2012-08-17 DIAGNOSIS — E119 Type 2 diabetes mellitus without complications: Secondary | ICD-10-CM | POA: Insufficient documentation

## 2012-09-27 ENCOUNTER — Ambulatory Visit (INDEPENDENT_AMBULATORY_CARE_PROVIDER_SITE_OTHER): Payer: Medicare Other | Admitting: Ophthalmology

## 2013-03-19 ENCOUNTER — Ambulatory Visit (INDEPENDENT_AMBULATORY_CARE_PROVIDER_SITE_OTHER): Payer: Self-pay | Admitting: Ophthalmology

## 2013-05-16 ENCOUNTER — Other Ambulatory Visit (HOSPITAL_COMMUNITY): Payer: Self-pay | Admitting: Internal Medicine

## 2013-05-16 DIAGNOSIS — R079 Chest pain, unspecified: Secondary | ICD-10-CM

## 2013-05-18 ENCOUNTER — Ambulatory Visit (HOSPITAL_COMMUNITY)
Admission: RE | Admit: 2013-05-18 | Discharge: 2013-05-18 | Disposition: A | Discharge status: Home / Self Care | Payer: Medicare Other | Source: Ambulatory Visit | Attending: Internal Medicine | Admitting: Internal Medicine

## 2013-05-18 DIAGNOSIS — R079 Chest pain, unspecified: Secondary | ICD-10-CM | POA: Insufficient documentation

## 2014-05-13 ENCOUNTER — Other Ambulatory Visit (HOSPITAL_COMMUNITY): Payer: Self-pay | Admitting: Physician Assistant

## 2014-05-13 DIAGNOSIS — R109 Unspecified abdominal pain: Secondary | ICD-10-CM

## 2014-05-15 ENCOUNTER — Ambulatory Visit (HOSPITAL_COMMUNITY): Payer: Medicare Other

## 2014-05-16 ENCOUNTER — Ambulatory Visit (HOSPITAL_COMMUNITY)
Admission: RE | Admit: 2014-05-16 | Discharge: 2014-05-16 | Disposition: A | Discharge status: Home / Self Care | Payer: PPO | Source: Ambulatory Visit | Attending: Physician Assistant | Admitting: Physician Assistant

## 2014-05-16 DIAGNOSIS — R1011 Right upper quadrant pain: Secondary | ICD-10-CM | POA: Insufficient documentation

## 2014-05-16 DIAGNOSIS — R109 Unspecified abdominal pain: Secondary | ICD-10-CM

## 2014-05-16 DIAGNOSIS — G8929 Other chronic pain: Secondary | ICD-10-CM | POA: Diagnosis not present

## 2014-05-16 LAB — POCT I-STAT CREATININE: Creatinine, Ser: 1.1 mg/dL (ref 0.61–1.24)

## 2014-05-16 MED ORDER — IOHEXOL 300 MG/ML  SOLN
100.0000 mL | Freq: Once | INTRAMUSCULAR | Status: AC | PRN
  Administered 2014-05-16: 100 mL via INTRAVENOUS

## 2014-06-07 ENCOUNTER — Other Ambulatory Visit (HOSPITAL_COMMUNITY): Payer: Self-pay | Admitting: Internal Medicine

## 2014-06-07 ENCOUNTER — Ambulatory Visit (HOSPITAL_COMMUNITY)
Admission: RE | Admit: 2014-06-07 | Discharge: 2014-06-07 | Disposition: A | Discharge status: Home / Self Care | Payer: PPO | Source: Ambulatory Visit | Attending: Internal Medicine | Admitting: Internal Medicine

## 2014-06-07 DIAGNOSIS — R109 Unspecified abdominal pain: Secondary | ICD-10-CM | POA: Diagnosis not present

## 2014-06-07 MED ORDER — IOHEXOL 300 MG/ML  SOLN
50.0000 mL | Freq: Once | INTRAMUSCULAR | Status: AC | PRN
  Administered 2014-06-07: 50 mL via ORAL

## 2014-06-07 MED ORDER — IOHEXOL 300 MG/ML  SOLN
100.0000 mL | Freq: Once | INTRAMUSCULAR | Status: AC | PRN
  Administered 2014-06-07: 100 mL via INTRAVENOUS

## 2014-06-24 ENCOUNTER — Encounter: Payer: Self-pay | Admitting: Gastroenterology

## 2014-07-09 ENCOUNTER — Ambulatory Visit: Payer: PPO | Admitting: Gastroenterology

## 2014-07-11 ENCOUNTER — Ambulatory Visit: Payer: PPO | Admitting: Gastroenterology

## 2014-07-17 ENCOUNTER — Encounter: Payer: Self-pay | Admitting: Gastroenterology

## 2014-07-17 ENCOUNTER — Other Ambulatory Visit: Payer: Self-pay

## 2014-07-17 ENCOUNTER — Ambulatory Visit (INDEPENDENT_AMBULATORY_CARE_PROVIDER_SITE_OTHER): Payer: PPO | Admitting: Gastroenterology

## 2014-07-17 VITALS — BP 142/80 | HR 74 | Temp 98.7°F | Ht 73.0 in | Wt 226.6 lb

## 2014-07-17 DIAGNOSIS — D520 Dietary folate deficiency anemia: Secondary | ICD-10-CM

## 2014-07-17 DIAGNOSIS — D649 Anemia, unspecified: Secondary | ICD-10-CM | POA: Diagnosis not present

## 2014-07-17 DIAGNOSIS — K760 Fatty (change of) liver, not elsewhere classified: Secondary | ICD-10-CM | POA: Diagnosis not present

## 2014-07-17 DIAGNOSIS — Z1211 Encounter for screening for malignant neoplasm of colon: Secondary | ICD-10-CM

## 2014-07-17 DIAGNOSIS — K625 Hemorrhage of anus and rectum: Secondary | ICD-10-CM

## 2014-07-17 LAB — HEPATIC FUNCTION PANEL
ALK PHOS: 49 U/L (ref 39–117)
ALT: 12 U/L (ref 0–53)
AST: 15 U/L (ref 0–37)
Albumin: 4.1 g/dL (ref 3.5–5.2)
BILIRUBIN INDIRECT: 0.3 mg/dL (ref 0.2–1.2)
Bilirubin, Direct: 0.1 mg/dL (ref 0.0–0.3)
TOTAL PROTEIN: 6.5 g/dL (ref 6.0–8.3)
Total Bilirubin: 0.4 mg/dL (ref 0.2–1.2)

## 2014-07-17 LAB — IRON AND TIBC
%SAT: 20 % (ref 20–55)
IRON: 68 ug/dL (ref 42–165)
TIBC: 343 ug/dL (ref 215–435)
UIBC: 275 ug/dL (ref 125–400)

## 2014-07-17 MED ORDER — SOD PICOSULFATE-MAG OX-CIT ACD 10-3.5-12 MG-GM-GM PO PACK: 1.0000 | PACK | ORAL | Status: DC

## 2014-07-17 NOTE — Progress Notes (Signed)
Primary Care Physician:  Glo Herring., MD Primary Gastroenterologist:  Dr. Oneida Alar   Chief Complaint  Patient presents with  . Rectal Bleeding  . set up TCS    HPI:   Wayne Pratt is a 54 y.o. male presenting today at the request of Dr. Gerarda Fraction to arrange colonoscopy. Underwent flex sig in an attempt at colonoscopy in 2013 but was poor prep. Was unable to tolerate large volume prep, and he still did not do well with Miralax.   Weight in Aug 2013 was 255. Oct 2013 248. Now 226. Was seeing moderate to large amount of rectal bleeding, intermittent. Symptom onset several months ago. Painless. Intermittent constipation, rectal discomfort with defecation. Poor appetite.. Chronic right-sided abdominal pain. Gallbladder in situ. Intermittent, lasting for days at a time. Nothing relieves the pain. BM almost every day. No changes in stool caliber. Hard sometimes. Decreased appetite.   No melena. No upper GI symptoms, denying N/V, reflux exacerbations. Reportedly had an EGD about a year ago at [facility]. Requesting reports.   Taking iron for a month.    Past Medical History  Diagnosis Date  . Hypertension   . Diabetes mellitus   . Hypercholesterolemia   . Neuropathy   . Empyema lung   . Fatty liver     on abd u/s, lfts normal 04/2011  . Sleep apnea     Stop Bang score of 7  . Kidney stones     22 times    Past Surgical History  Procedure Laterality Date  . Elbow surgery    . Tonsillectomy    . Cervical disc surgery    . Lung surgery  2010    for empyema  . Flexible sigmoidoscopy  10/05/2011    Dr. Fields:poor prep  . Esophageal biopsy  10/05/2011    Dr. Oneida Alar :non-erosive gastritis  . Esophagogastroduodenoscopy  Sept 2013    Dr. Oneida Alar: chronic inactive gastritis     Current Outpatient Prescriptions  Medication Sig Dispense Refill  . ALPRAZolam (XANAX) 1 MG tablet Take 1 mg by mouth 3 (three) times daily.    . fentaNYL (DURAGESIC - DOSED MCG/HR) 100 MCG/HR Place 1 patch  onto the skin every other day.     . Ferrous Sulfate (IRON) 325 (65 FE) MG TABS Take by mouth.    . lovastatin (MEVACOR) 40 MG tablet Take 40 mg by mouth at bedtime.    . metFORMIN (GLUCOPHAGE) 500 MG tablet Take 500 mg by mouth 2 (two) times daily with a meal.    . oxyCODONE (ROXICODONE) 15 MG immediate release tablet Take 1 tablet by mouth Every 4 hours as needed. Pain     No current facility-administered medications for this visit.    Allergies as of 07/17/2014 - Review Complete 07/17/2014  Allergen Reaction Noted  . Penicillins Anaphylaxis 08/18/2011  . Ativan [lorazepam] Other (See Comments) 09/20/2011    Family History  Problem Relation Age of Onset  . Colon cancer Neg Hx   . Prostate cancer Other     all male members on mother's side    History   Social History  . Marital Status: Married    Spouse Name: N/A  . Number of Children: 2  . Years of Education: N/A   Occupational History  . disabled     2005   Social History Main Topics  . Smoking status: Current Every Day Smoker -- 1.75 packs/day for 30 years    Types: Cigarettes  . Smokeless tobacco:  Not on file  . Alcohol Use: No  . Drug Use: No  . Sexual Activity: Not on file   Other Topics Concern  . Not on file   Social History Narrative    Review of Systems: Gen: see HPI CV: Denies chest pain, heart palpitations, peripheral edema, syncope.  Resp: Denies shortness of breath at rest or with exertion. Denies wheezing or cough.  GI: see HPI GU : Denies urinary burning, urinary frequency, urinary hesitancy MS: +neuropathy Derm: Denies rash, itching, dry skin Psych: Denies depression, anxiety, memory loss, and confusion Heme: Denies bruising, bleeding, and enlarged lymph nodes.  Physical Exam: BP 142/80 mmHg  Pulse 74  Temp(Src) 98.7 F (37.1 C)  Ht 6\' 1"  (1.854 m)  Wt 226 lb 9.6 oz (102.785 kg)  BMI 29.90 kg/m2 General:   Alert and oriented. Pleasant and cooperative. Well-nourished and  well-developed.  Head:  Normocephalic and atraumatic. Eyes:  Without icterus, sclera clear and conjunctiva pink.  Ears:  Normal auditory acuity. Nose:  No deformity, discharge,  or lesions. Mouth:  No deformity or lesions, oral mucosa pink.  Lungs:  Clear to auscultation bilaterally. No wheezes, rales, or rhonchi. No distress.  Heart:  S1, S2 present without murmurs appreciated.  Abdomen:  +BS, soft, non-tender and non-distended. No HSM noted. No guarding or rebound. No masses appreciated.  Rectal:  Deferred  Msk:  Symmetrical without gross deformities. Normal posture. Extremities:  Without edema. Neurologic:  Alert and  oriented x4;  grossly normal neurologically. Skin:  Intact without significant lesions or rashes. Psych:  Alert and cooperative. Normal mood and affect.  Outside labs April 2016: Hgb 12.1, Hct 37 (previously Hgb in 14 range in 2014).  LFTs normal  CT abd/pelvis May 2016:  IMPRESSION: 1. Stable scattered hepatic cysts. 2. No hydronephrosis or hydroureter. 3. Stool noted throughout the colon. No evidence of colonic obstruction. No pericecal inflammation. Normal appendix. 4. Atherosclerotic calcifications of abdominal aorta and iliac arteries. 5. Mild distended urinary bladder

## 2014-07-17 NOTE — Patient Instructions (Signed)
Start taking Miralax one capful each evening. This is for constipation.  I want you to follow a clear liquid diet for 2 days before your colonoscopy.   Please follow the directions carefully when doing the prep. Make sure the day before you start that you "pre-hydrate", drinking at least 8 glasses of water that day to keep urine light yellow/clear.   Please have blood work done today.   We have scheduled you for a special ultrasound of your liver for further evaluation.   We have set you up for a colonoscopy with possible upper endoscopy (if needed) with Dr. Oneida Alar.

## 2014-07-18 LAB — CBC
HCT: 37.7 % — ABNORMAL LOW (ref 39.0–52.0)
Hemoglobin: 12.9 g/dL — ABNORMAL LOW (ref 13.0–17.0)
MCH: 29.9 pg (ref 26.0–34.0)
MCHC: 34.2 g/dL (ref 30.0–36.0)
MCV: 87.3 fL (ref 78.0–100.0)
MPV: 11.4 fL (ref 8.6–12.4)
PLATELETS: 261 10*3/uL (ref 150–400)
RBC: 4.32 MIL/uL (ref 4.22–5.81)
RDW: 13.1 % (ref 11.5–15.5)
WBC: 6.6 10*3/uL (ref 4.0–10.5)

## 2014-07-18 LAB — FERRITIN: FERRITIN: 86 ng/mL (ref 22–322)

## 2014-07-19 ENCOUNTER — Ambulatory Visit (HOSPITAL_COMMUNITY)
Admission: RE | Admit: 2014-07-19 | Discharge: 2014-07-19 | Disposition: A | Discharge status: Home / Self Care | Payer: PPO | Source: Ambulatory Visit | Attending: Gastroenterology | Admitting: Gastroenterology

## 2014-07-19 DIAGNOSIS — D649 Anemia, unspecified: Secondary | ICD-10-CM | POA: Insufficient documentation

## 2014-07-19 DIAGNOSIS — K760 Fatty (change of) liver, not elsewhere classified: Secondary | ICD-10-CM | POA: Insufficient documentation

## 2014-07-19 DIAGNOSIS — D520 Dietary folate deficiency anemia: Secondary | ICD-10-CM

## 2014-07-24 ENCOUNTER — Telehealth: Payer: Self-pay | Admitting: Gastroenterology

## 2014-07-24 NOTE — Assessment & Plan Note (Signed)
54 year old male with need for initial colonoscopy, with failed attempt several years ago after inability to tolerate large volume prep. He was unable to complete with Miralax as well. He is open to trying Prepopik, although I discussed in detail the importance of following directions explicitly. Intermittent moderate to large amount of rectal bleeding noted, painless, in setting of intermittent constipation. Likely benign anorectal source, but with recent documented weight loss (from 2013 to 2016) of around 30 lbs, further GI evaluation should be undertaken. Question if chronic right-sided abdominal pain is secondary to underlying constipation. CT showing stool burden. LFTs normal.   Proceed with colonoscopy with Dr. Oneida Alar in the near future. The risks, benefits, and alternatives have been discussed in detail with the patient. They state understanding and desire to proceed.  PROPOFOL due to polypharmacy Start Miralax each evening now Prepopik, as patient is refusing any other preps.  2 days of clear liquids prior to actual procedure

## 2014-07-24 NOTE — Progress Notes (Signed)
Quick Note:  Metavir F2 and F3. Reportedly had an EGD last year at [facility]. Waiting on records. Does not appear to be iron deficient. Colonoscopy as planned. Repeat elastography in 1 year. ______

## 2014-07-24 NOTE — Assessment & Plan Note (Addendum)
Hgb 12.1, with prior documentation of Hgb 14 in 2014. Hematochezia noted but no melena. Colonoscopy as planned. Check iron, ferritin. If evidence of IDA, proceed with EGD at time of colonoscopy. As of note, EGD reportedly completed at [facility] 1 year ago. Request records.   Addendum: no EGD at [facility]. Possible EGD at time of colonoscopy.

## 2014-07-24 NOTE — Assessment & Plan Note (Signed)
Likely benign in setting of constipation but unable to exclude occult etiology. Colonoscopy as planned.

## 2014-07-24 NOTE — Telephone Encounter (Signed)
Almyra Free: can you get the EGD reports from [facility] (possibly last year? ) I'm unable to find this in care everywhere.

## 2014-07-24 NOTE — Assessment & Plan Note (Signed)
On [place] in 2013. Proceed with elastography. Reportedly had an EGD at [facility] a year ago. Request reports. LFTs in April 2016 normal.

## 2014-07-25 NOTE — Telephone Encounter (Signed)
According to [facility], pt has not been to [facility] since 1998 and he was not seen by GI in 1998

## 2014-07-25 NOTE — Telephone Encounter (Signed)
Noted. Thanks.

## 2014-07-25 NOTE — Progress Notes (Signed)
Quick Note:  Pt is aware of results. ______ 

## 2014-07-26 NOTE — Progress Notes (Signed)
CC'ED TO PCP 

## 2014-07-30 NOTE — Patient Instructions (Signed)
Wayne Pratt  07/30/2014     @PREFPERIOPPHARMACY @   Your procedure is scheduled on 08/06/2014.  Report to [facility] at 6:15 A.M.  Call this number if you have problems the morning of surgery:  [phone]   Remember:  Do not eat food or drink liquids after midnight.  Take these medicines the morning of surgery with A SIP OF WATER :  Xanax, Ferrous Sulfate, and Oxycodone   Do not wear jewelry, make-up or nail polish.  Do not wear lotions, powders, or perfumes.  You may wear deodorant.  Do not shave 48 hours prior to surgery.  Men may shave face and neck.  Do not bring valuables to the hospital.  [facility] is not responsible for any belongings or valuables.  Contacts, dentures or bridgework may not be worn into surgery.  Leave your suitcase in the car.  After surgery it may be brought to your room.  For patients admitted to the hospital, discharge time will be determined by your treatment team.  Patients discharged the day of surgery will not be allowed to drive home.   Name and phone number of your driver:   family Special instructions:  n/a  Please read over the following fact sheets that you were given. Anesthesia Post-op Instructions      Esophagogastroduodenoscopy Esophagogastroduodenoscopy (EGD) is a procedure to examine the lining of the esophagus, stomach, and first part of the small intestine (duodenum). A long, flexible, lighted tube with a camera attached (endoscope) is inserted down the throat to view these organs. This procedure is done to detect problems or abnormalities, such as inflammation, bleeding, ulcers, or growths, in order to treat them. The procedure lasts about 5-20 minutes. It is usually an outpatient procedure, but it may need to be performed in emergency cases in the hospital. LET YOUR CAREGIVER KNOW ABOUT:   Allergies to food or medicine.  All medicines you are taking, including vitamins, herbs, eyedrops, and over-the-counter medicines and  creams.  Use of steroids (by mouth or creams).  Previous problems you or members of your family have had with the use of anesthetics.  Any blood disorders you have.  Previous surgeries you have had.  Other health problems you have.  Possibility of pregnancy, if this applies. RISKS AND COMPLICATIONS  Generally, EGD is a safe procedure. However, as with any procedure, complications can occur. Possible complications include:  Infection.  Bleeding.  Tearing (perforation) of the esophagus, stomach, or duodenum.  Difficulty breathing or not being able to breath.  Excessive sweating.  Spasms of the larynx.  Slowed heartbeat.  Low blood pressure. BEFORE THE PROCEDURE  Do not eat or drink anything for 6-8 hours before the procedure or as directed by your caregiver.  Ask your caregiver about changing or stopping your regular medicines.  If you wear dentures, be prepared to remove them before the procedure.  Arrange for someone to drive you home after the procedure. PROCEDURE   A vein will be accessed to give medicines and fluids. A medicine to relax you (sedative) and a pain reliever will be given through that access into the vein.  A numbing medicine (local anesthetic) may be sprayed on your throat for comfort and to stop you from gagging or coughing.  A mouth guard may be placed in your mouth to protect your teeth and to keep you from biting on the endoscope.  You will be asked to lie on your left side.  The endoscope is inserted down  your throat and into the esophagus, stomach, and duodenum.  Air is put through the endoscope to allow your caregiver to view the lining of your esophagus clearly.  The esophagus, stomach, and duodenum is then examined. During the exam, your caregiver may:  Remove tissue to be examined under a microscope (biopsy) for inflammation, infection, or other medical problems.  Remove growths.  Remove objects (foreign bodies) that are  stuck.  Treat any bleeding with medicines or other devices that stop tissues from bleeding (hot cautery, clipping devices).  Widen (dilate) or stretch narrowed areas of the esophagus and stomach.  The endoscope will then be withdrawn. AFTER THE PROCEDURE  You will be taken to a recovery area to be monitored. You will be able to go home once you are stable and alert.  Do not eat or drink anything until the local anesthetic and numbing medicines have worn off. You may choke.  It is normal to feel bloated, have pain with swallowing, or have a sore throat for a short time. This will wear off.  Your caregiver should be able to discuss his or her findings with you. It will take longer to discuss the test results if any biopsies were taken. Document Released: 04/30/2004 Document Revised: 05/14/2013 Document Reviewed: 12/01/2011 [facility] Patient Information 2015 [place], [place]. This information is not intended to replace advice given to you by your health care provider. Make sure you discuss any questions you have with your health care provider. Colonoscopy A colonoscopy is an exam to look at the entire large intestine (colon). This exam can help find problems such as tumors, polyps, inflammation, and areas of bleeding. The exam takes about 1 hour.  LET [facility] CARE PROVIDER KNOW ABOUT:   Any allergies you have.  All medicines you are taking, including vitamins, herbs, eye drops, creams, and over-the-counter medicines.  Previous problems you or members of your family have had with the use of anesthetics.  Any blood disorders you have.  Previous surgeries you have had.  Medical conditions you have. RISKS AND COMPLICATIONS  Generally, this is a safe procedure. However, as with any procedure, complications can occur. Possible complications include:  Bleeding.  Tearing or rupture of the colon wall.  Reaction to medicines given during the exam.  Infection (rare). BEFORE THE  PROCEDURE   Ask your health care provider about changing or stopping your regular medicines.  You may be prescribed an oral bowel prep. This involves drinking a large amount of medicated liquid, starting the day before your procedure. The liquid will cause you to have multiple loose stools until your stool is almost clear or light green. This cleans out your colon in preparation for the procedure.  Do not eat or drink anything else once you have started the bowel prep, unless your health care provider tells you it is safe to do so.  Arrange for someone to drive you home after the procedure. PROCEDURE   You will be given medicine to help you relax (sedative).  You will lie on your side with your knees bent.  A long, flexible tube with a light and camera on the end (colonoscope) will be inserted through the rectum and into the colon. The camera sends video back to a computer screen as it moves through the colon. The colonoscope also releases carbon dioxide gas to inflate the colon. This helps your health care provider see the area better.  During the exam, your health care provider may take a small tissue sample (  biopsy) to be examined under a microscope if any abnormalities are found.  The exam is finished when the entire colon has been viewed. AFTER THE PROCEDURE   Do not drive for 24 hours after the exam.  You may have a small amount of blood in your stool.  You may pass moderate amounts of gas and have mild abdominal cramping or bloating. This is caused by the gas used to inflate your colon during the exam.  Ask when your test results will be ready and how you will get your results. Make sure you get your test results. Document Released: 12/26/1999 Document Revised: 10/18/2012 Document Reviewed: 09/04/2012 [facility] Patient Information 2015 [place], [place]. This information is not intended to replace advice given to you by your health care provider. Make sure you discuss any questions  you have with your health care provider.

## 2014-08-01 ENCOUNTER — Other Ambulatory Visit: Payer: Self-pay

## 2014-08-01 ENCOUNTER — Encounter (HOSPITAL_COMMUNITY)
Admission: RE | Admit: 2014-08-01 | Discharge: 2014-08-01 | Disposition: A | Discharge status: Home / Self Care | Payer: PPO | Source: Ambulatory Visit | Attending: Gastroenterology | Admitting: Gastroenterology

## 2014-08-01 ENCOUNTER — Encounter (HOSPITAL_COMMUNITY): Payer: Self-pay

## 2014-08-01 DIAGNOSIS — Z01818 Encounter for other preprocedural examination: Secondary | ICD-10-CM | POA: Insufficient documentation

## 2014-08-01 LAB — CBC WITH DIFFERENTIAL/PLATELET
Basophils Absolute: 0 10*3/uL (ref 0.0–0.1)
Basophils Relative: 0 % (ref 0–1)
EOS ABS: 0.2 10*3/uL (ref 0.0–0.7)
EOS PCT: 3 % (ref 0–5)
HEMATOCRIT: 38.6 % — AB (ref 39.0–52.0)
HEMOGLOBIN: 13 g/dL (ref 13.0–17.0)
LYMPHS ABS: 2.3 10*3/uL (ref 0.7–4.0)
Lymphocytes Relative: 30 % (ref 12–46)
MCH: 30.7 pg (ref 26.0–34.0)
MCHC: 33.7 g/dL (ref 30.0–36.0)
MCV: 91.3 fL (ref 78.0–100.0)
Monocytes Absolute: 0.5 10*3/uL (ref 0.1–1.0)
Monocytes Relative: 6 % (ref 3–12)
NEUTROS ABS: 4.7 10*3/uL (ref 1.7–7.7)
Neutrophils Relative %: 61 % (ref 43–77)
PLATELETS: 207 10*3/uL (ref 150–400)
RBC: 4.23 MIL/uL (ref 4.22–5.81)
RDW: 12.9 % (ref 11.5–15.5)
WBC: 7.7 10*3/uL (ref 4.0–10.5)

## 2014-08-01 LAB — BASIC METABOLIC PANEL
Anion gap: 7 (ref 5–15)
BUN: 16 mg/dL (ref 6–20)
CO2: 28 mmol/L (ref 22–32)
Calcium: 9.1 mg/dL (ref 8.9–10.3)
Chloride: 101 mmol/L (ref 101–111)
Creatinine, Ser: 1.05 mg/dL (ref 0.61–1.24)
GFR calc non Af Amer: >60 mL/min (ref >60)
Glucose, Bld: 230 mg/dL — ABNORMAL HIGH (ref 65–99)
POTASSIUM: 4.4 mmol/L (ref 3.5–5.1)
Sodium: 136 mmol/L (ref 135–145)

## 2014-08-02 ENCOUNTER — Telehealth: Payer: Self-pay | Admitting: Gastroenterology

## 2014-08-02 NOTE — Telephone Encounter (Addendum)
PLEASE CALL PT. HIS BLOOD SUGAR WAS 230. HE NEEDS TO STRICTLY FOLLOW A DIABETIC DIET OR A ELEVATED SUGAR LEVEL WILL LEAD TO COMPLICATIONS FROM DIABETES. HE SHOULD HAVE HIS PRIMARY DOCTOR RECHECK HIS FASTING SUGAR LEVEL.  HIS EKG SHOWS A SMALL ABNORMALITY. IF HE IS NOT HAVING CHEST PAIN , SOB, OR DYSPNEA ON EXERTION, THEN THE ONLY THING HE NEEDS TO DO IS SEE CARDIOLOGY TO DISCUSS MODIFYING HIS RISK FACTORS FOR HEART DISEASE.

## 2014-08-05 NOTE — Telephone Encounter (Signed)
Called and informed pt's wife Hilda Blades.

## 2014-08-06 ENCOUNTER — Encounter (HOSPITAL_COMMUNITY)
Admission: RE | Disposition: A | Discharge status: Home / Self Care | Payer: Self-pay | Source: Ambulatory Visit | Attending: Gastroenterology

## 2014-08-06 ENCOUNTER — Ambulatory Visit (HOSPITAL_COMMUNITY): Payer: PPO | Admitting: Anesthesiology

## 2014-08-06 ENCOUNTER — Ambulatory Visit (HOSPITAL_COMMUNITY)
Admission: RE | Admit: 2014-08-06 | Discharge: 2014-08-06 | Disposition: A | Discharge status: Home / Self Care | Payer: PPO | Source: Ambulatory Visit | Attending: Gastroenterology | Admitting: Gastroenterology

## 2014-08-06 ENCOUNTER — Other Ambulatory Visit: Payer: Self-pay

## 2014-08-06 ENCOUNTER — Encounter (HOSPITAL_COMMUNITY): Payer: Self-pay

## 2014-08-06 DIAGNOSIS — Z79899 Other long term (current) drug therapy: Secondary | ICD-10-CM | POA: Diagnosis not present

## 2014-08-06 DIAGNOSIS — K2990 Gastroduodenitis, unspecified, without bleeding: Secondary | ICD-10-CM | POA: Diagnosis not present

## 2014-08-06 DIAGNOSIS — F419 Anxiety disorder, unspecified: Secondary | ICD-10-CM | POA: Insufficient documentation

## 2014-08-06 DIAGNOSIS — E780 Pure hypercholesterolemia: Secondary | ICD-10-CM | POA: Diagnosis not present

## 2014-08-06 DIAGNOSIS — I10 Essential (primary) hypertension: Secondary | ICD-10-CM | POA: Diagnosis not present

## 2014-08-06 DIAGNOSIS — E119 Type 2 diabetes mellitus without complications: Secondary | ICD-10-CM | POA: Insufficient documentation

## 2014-08-06 DIAGNOSIS — N289 Disorder of kidney and ureter, unspecified: Secondary | ICD-10-CM | POA: Diagnosis not present

## 2014-08-06 DIAGNOSIS — J449 Chronic obstructive pulmonary disease, unspecified: Secondary | ICD-10-CM | POA: Diagnosis not present

## 2014-08-06 DIAGNOSIS — K2980 Duodenitis without bleeding: Secondary | ICD-10-CM | POA: Insufficient documentation

## 2014-08-06 DIAGNOSIS — G4730 Sleep apnea, unspecified: Secondary | ICD-10-CM | POA: Insufficient documentation

## 2014-08-06 DIAGNOSIS — K2950 Unspecified chronic gastritis without bleeding: Secondary | ICD-10-CM | POA: Diagnosis not present

## 2014-08-06 DIAGNOSIS — R1011 Right upper quadrant pain: Secondary | ICD-10-CM | POA: Diagnosis present

## 2014-08-06 DIAGNOSIS — F17210 Nicotine dependence, cigarettes, uncomplicated: Secondary | ICD-10-CM | POA: Diagnosis not present

## 2014-08-06 DIAGNOSIS — G629 Polyneuropathy, unspecified: Secondary | ICD-10-CM | POA: Diagnosis not present

## 2014-08-06 DIAGNOSIS — K2970 Gastritis, unspecified, without bleeding: Secondary | ICD-10-CM | POA: Diagnosis not present

## 2014-08-06 DIAGNOSIS — D649 Anemia, unspecified: Secondary | ICD-10-CM | POA: Diagnosis not present

## 2014-08-06 DIAGNOSIS — Z79891 Long term (current) use of opiate analgesic: Secondary | ICD-10-CM | POA: Diagnosis not present

## 2014-08-06 HISTORY — DX: Anemia, unspecified: D64.9

## 2014-08-06 HISTORY — PX: ESOPHAGOGASTRODUODENOSCOPY (EGD) WITH PROPOFOL: SHX5813

## 2014-08-06 HISTORY — PX: BIOPSY: SHX5522

## 2014-08-06 LAB — GLUCOSE, CAPILLARY
GLUCOSE-CAPILLARY: 171 mg/dL — AB (ref 65–99)
Glucose-Capillary: 152 mg/dL — ABNORMAL HIGH (ref 65–99)

## 2014-08-06 SURGERY — ESOPHAGOGASTRODUODENOSCOPY (EGD) WITH PROPOFOL
Anesthesia: Monitor Anesthesia Care

## 2014-08-06 MED ORDER — FENTANYL CITRATE (PF) 100 MCG/2ML IJ SOLN
INTRAMUSCULAR | Status: AC
  Filled 2014-08-06: qty 2

## 2014-08-06 MED ORDER — LIDOCAINE HCL 1 % IJ SOLN
INTRAMUSCULAR | Status: DC | PRN
  Administered 2014-08-06: 10 mg via INTRADERMAL

## 2014-08-06 MED ORDER — LIDOCAINE HCL (PF) 1 % IJ SOLN
INTRAMUSCULAR | Status: AC
  Filled 2014-08-06: qty 5

## 2014-08-06 MED ORDER — MIDAZOLAM HCL 2 MG/2ML IJ SOLN
INTRAMUSCULAR | Status: AC
  Filled 2014-08-06: qty 2

## 2014-08-06 MED ORDER — PROPOFOL INFUSION 10 MG/ML OPTIME
INTRAVENOUS | Status: DC | PRN
  Administered 2014-08-06: 125 ug/kg/min via INTRAVENOUS
  Administered 2014-08-06: 08:00:00 via INTRAVENOUS

## 2014-08-06 MED ORDER — FENTANYL CITRATE (PF) 100 MCG/2ML IJ SOLN: 25.0000 ug | INTRAMUSCULAR | Status: DC | PRN

## 2014-08-06 MED ORDER — GLYCOPYRROLATE 0.2 MG/ML IJ SOLN
INTRAMUSCULAR | Status: AC
  Filled 2014-08-06: qty 1

## 2014-08-06 MED ORDER — ONDANSETRON HCL 4 MG/2ML IJ SOLN
INTRAMUSCULAR | Status: AC
  Filled 2014-08-06: qty 2

## 2014-08-06 MED ORDER — LIDOCAINE VISCOUS 2 % MT SOLN
15.0000 mL | Freq: Once | OROMUCOSAL | Status: AC
  Administered 2014-08-06: 15 mL via OROMUCOSAL

## 2014-08-06 MED ORDER — PROPOFOL 10 MG/ML IV BOLUS
INTRAVENOUS | Status: AC
  Filled 2014-08-06: qty 20

## 2014-08-06 MED ORDER — GLYCOPYRROLATE 0.2 MG/ML IJ SOLN
0.2000 mg | Freq: Once | INTRAMUSCULAR | Status: AC
  Administered 2014-08-06: 0.2 mg via INTRAVENOUS

## 2014-08-06 MED ORDER — FENTANYL CITRATE (PF) 100 MCG/2ML IJ SOLN
25.0000 ug | INTRAMUSCULAR | Status: AC
  Administered 2014-08-06 (×2): 25 ug via INTRAVENOUS

## 2014-08-06 MED ORDER — ONDANSETRON HCL 4 MG/2ML IJ SOLN
4.0000 mg | Freq: Once | INTRAMUSCULAR | Status: AC
  Administered 2014-08-06: 4 mg via INTRAVENOUS

## 2014-08-06 MED ORDER — LACTATED RINGERS IV SOLN
INTRAVENOUS | Status: DC
  Administered 2014-08-06: 07:00:00 via INTRAVENOUS

## 2014-08-06 MED ORDER — WATER FOR IRRIGATION, STERILE IR SOLN
Status: DC | PRN
  Administered 2014-08-06: 1000 mL

## 2014-08-06 MED ORDER — PROPOFOL 10 MG/ML IV BOLUS
INTRAVENOUS | Status: DC | PRN
  Administered 2014-08-06: 20 mg via INTRAVENOUS
  Administered 2014-08-06: 10 mg via INTRAVENOUS

## 2014-08-06 MED ORDER — ONDANSETRON HCL 4 MG/2ML IJ SOLN
4.0000 mg | Freq: Once | INTRAMUSCULAR | Status: DC | PRN
Start: 2014-08-06 — End: 2014-08-06

## 2014-08-06 MED ORDER — MIDAZOLAM HCL 2 MG/2ML IJ SOLN
1.0000 mg | INTRAMUSCULAR | Status: DC | PRN
  Administered 2014-08-06: 2 mg via INTRAVENOUS

## 2014-08-06 MED ORDER — LIDOCAINE VISCOUS 2 % MT SOLN
OROMUCOSAL | Status: AC
  Filled 2014-08-06: qty 15

## 2014-08-06 MED ORDER — PEG 3350-KCL-NA BICARB-NACL 420 G PO SOLR: 4000.0000 mL | Freq: Once | ORAL | Status: DC

## 2014-08-06 SURGICAL SUPPLY — 9 items
BLOCK BITE 60FR ADLT L/F BLUE (MISCELLANEOUS) ×1 IMPLANT
FLOOR PAD 36X40 (MISCELLANEOUS) ×3
FORCEPS BIOP RAD 4 LRG CAP 4 (CUTTING FORCEPS) ×1 IMPLANT
FORMALIN 10 PREFIL 20ML (MISCELLANEOUS) ×1 IMPLANT
KIT ENDO PROCEDURE PEN (KITS) ×3 IMPLANT
MANIFOLD NEPTUNE II (INSTRUMENTS) ×3 IMPLANT
PAD FLOOR 36X40 (MISCELLANEOUS) ×2 IMPLANT
TUBING IRRIGATION ENDOGATOR (MISCELLANEOUS) ×1 IMPLANT
WATER STERILE IRR 1000ML POUR (IV SOLUTION) ×1 IMPLANT

## 2014-08-06 NOTE — H&P (Signed)
Primary Care Physician:  Glo Herring., MD Primary Gastroenterologist:  Dr. Oneida Alar  Pre-Procedure History & Physical: HPI:  Wayne Pratt is a 54 y.o. male here for BRBPR/ANEMIA.  Past Medical History  Diagnosis Date  . Hypertension   . Diabetes mellitus   . Hypercholesterolemia   . Neuropathy   . Empyema lung   . Fatty liver     on abd u/s, lfts normal 04/2011  . Sleep apnea     Stop Bang score of 7  . Kidney stones     22 times  . Anemia     Past Surgical History  Procedure Laterality Date  . Elbow surgery    . Tonsillectomy    . Cervical disc surgery    . Lung surgery  2010    for empyema  . Flexible sigmoidoscopy  10/05/2011    Dr. Dellamae Rosamilia:poor prep  . Esophageal biopsy  10/05/2011    Dr. Oneida Alar :non-erosive gastritis  . Esophagogastroduodenoscopy  Sept 2013    Dr. Oneida Alar: chronic inactive gastritis   . Vasectomy Bilateral 1995    Prior to Admission medications   Medication Sig Start Date End Date Taking? Authorizing Provider  ALPRAZolam Duanne Moron) 1 MG tablet Take 1 mg by mouth 3 (three) times daily.   Yes Historical Provider, MD  fentaNYL (DURAGESIC - DOSED MCG/HR) 100 MCG/HR Place 1 patch onto the skin every other day.    Yes Historical Provider, MD  Ferrous Sulfate (IRON) 325 (65 FE) MG TABS Take by mouth.   Yes Historical Provider, MD  lovastatin (MEVACOR) 40 MG tablet Take 40 mg by mouth at bedtime.   Yes Historical Provider, MD  metFORMIN (GLUCOPHAGE) 500 MG tablet Take 500 mg by mouth 2 (two) times daily with a meal.   Yes Historical Provider, MD  Oxycodone HCl 10 MG TABS Take 10 mg by mouth daily.   Yes Historical Provider, MD  Sod Picosulfate-Mag Ox-Cit Acd 10-3.5-12 MG-GM-GM PACK Take 1 Container by mouth as directed. 07/17/14  Yes Danie Binder, MD    Allergies as of 07/17/2014 - Review Complete 07/17/2014  Allergen Reaction Noted  . Penicillins Anaphylaxis 08/18/2011  . Ativan [lorazepam] Other (See Comments) 09/20/2011    Family History  Problem  Relation Age of Onset  . Colon cancer Neg Hx   . Prostate cancer Other     all male members on mother's side    History   Social History  . Marital Status: Married    Spouse Name: N/A  . Number of Children: 2  . Years of Education: N/A   Occupational History  . disabled     2005   Social History Main Topics  . Smoking status: Current Every Day Smoker -- 1.00 packs/day for 30 years    Types: Cigarettes  . Smokeless tobacco: Not on file  . Alcohol Use: No  . Drug Use: No  . Sexual Activity: Yes    Birth Control/ Protection: None   Other Topics Concern  . Not on file   Social History Narrative    Review of Systems: See HPI, otherwise negative ROS   Physical Exam: BP 143/84 mmHg  Pulse 85  Temp(Src) 98.2 F (36.8 C) (Oral)  Resp 13  Ht 6\' 1"  (1.854 m)  Wt 225 lb (102.059 kg)  BMI 29.69 kg/m2  SpO2 96% General:   Alert,  pleasant and cooperative in NAD Head:  Normocephalic and atraumatic. Neck:  Supple; Lungs:  Clear throughout to auscultation.    Heart:  Regular rate and rhythm. Abdomen:  Soft, nontender and nondistended. Normal bowel sounds, without guarding, and without rebound.   Neurologic:  Alert and  oriented x4;  grossly normal neurologically.  Impression/Plan:    BRBPR/ANEMIA  PLAN: TCS/EGD TODAY

## 2014-08-06 NOTE — Anesthesia Postprocedure Evaluation (Signed)
  Anesthesia Post-op Note  Patient: Wayne Pratt  Procedure(s) Performed: Procedure(s): ESOPHAGOGASTRODUODENOSCOPY (EGD) WITH PROPOFOL (N/A) BIOPSY  Patient Location: PACU  Anesthesia Type:MAC  Level of Consciousness: awake and alert   Airway and Oxygen Therapy: Patient Spontanous Breathing  Post-op Pain: none  Post-op Assessment: Post-op Vital signs reviewed, Patient's Cardiovascular Status Stable, Respiratory Function Stable, Patent Airway and No signs of Nausea or vomiting              Post-op Vital Signs: Reviewed and stable  Last Vitals:  Filed Vitals:   08/06/14 0730  BP: 134/87  Pulse:   Temp:   Resp: 11    Complications: No apparent anesthesia complications

## 2014-08-06 NOTE — Op Note (Signed)
[place], [address]   ENDOSCOPY PROCEDURE REPORT  PATIENT: Wayne Pratt, Wayne Pratt  MR#: [id number] BIRTHDATE: 1960-07-17 , 34  yrs. old GENDER: male  ENDOSCOPIST: Danie Binder, MD REFERRED [id number] Gerarda Fraction, M.D.  PROCEDURE DATE: 08/11/14 PROCEDURE:   EGD w/ biopsy  INDICATIONS:abdominal pain in the upper right quadrant. MEDICATIONS: Monitored anesthesia care TOPICAL ANESTHETIC:   Viscous Xylocaine ASA CLASS:  DESCRIPTION OF PROCEDURE:     Physical exam was performed.  Informed consent was obtained from the patient after explaining the benefits, risks, and alternatives to the procedure.  The patient was connected to the monitor and placed in the left lateral position.  Continuous oxygen was provided by nasal cannula and IV medicine administered through an indwelling cannula.  After administration of sedation, the patients esophagus was intubated and the     endoscope was advanced under direct visualization to the second portion of the duodenum.  The scope was removed slowly by carefully examining the color, texture, anatomy, and integrity of the mucosa on the way out.  The patient was recovered in endoscopy and discharged home in satisfactory condition.  Estimated blood loss is zero unless otherwise noted in this procedure report.      ESOPHAGUS: The mucosa of the esophagus appeared normal.   STOMACH: Mild non-erosive gastritis (inflammation) was found in the gastric antrum.  Multiple biopsies were performed using cold forceps. DUODENUM: Moderate duodenal inflammation was found in the duodenal bulb.   The duodenal mucosa showed no abnormalities in the 2nd part of the duodenum. COMPLICATIONS: There were no immediate complications.  ENDOSCOPIC IMPRESSION: 1.   RUQ ABD PAIN MOST LIKELY DUE TO GASTRITIS AND DUODENITIS  RECOMMENDATIONS: AWAIT BIOPSY RETURN JUL 28 FOR COMPLETE TCS  REPEAT  EXAM:   _______________________________ eSignedDanie Binder, MD 11-Aug-2014 12:14 PM   CPT CODES: ICD CODES:  The ICD and CPT codes recommended by this software are interpretations from the data that the clinical staff has captured with the software.  The verification of the translation of this report to the ICD and CPT codes and modifiers is the sole responsibility of the health care institution and practicing physician where this report was generated.  [employer] will not be held responsible for the validity of the ICD and CPT codes included on this report.  AMA assumes no liability for data contained or not contained herein. [employer] is a Designer, television/film set of the [employer].

## 2014-08-06 NOTE — Anesthesia Preprocedure Evaluation (Signed)
Anesthesia Evaluation  Patient identified by MRN, date of birth, ID band Patient awake    Reviewed: Allergy & Precautions, H&P , NPO status , Patient's Chart, lab work & pertinent test results  History of Anesthesia Complications Negative for: history of anesthetic complications  Airway Mallampati: I  TM Distance: >3 FB Neck ROM: Full    Dental  (+) Edentulous Upper, Edentulous Lower   Pulmonary sleep apnea , COPDCurrent Smoker,  breath sounds clear to auscultation        Cardiovascular hypertension, Pt. on medications Rhythm:Regular Rate:Normal     Neuro/Psych Anxiety    GI/Hepatic   Endo/Other  diabetes, Well Controlled, Type 2, Oral Hypoglycemic Agents  Renal/GU Renal disease     Musculoskeletal   Abdominal   Peds  Hematology   Anesthesia Other Findings   Reproductive/Obstetrics                             Anesthesia Physical Anesthesia Plan  ASA: III  Anesthesia Plan: MAC   Post-op Pain Management:    Induction: Intravenous  Airway Management Planned: Simple Face Mask  Additional Equipment:   Intra-op Plan:   Post-operative Plan:   Informed Consent: I have reviewed the patients History and Physical, chart, labs and discussed the procedure including the risks, benefits and alternatives for the proposed anesthesia with the patient or authorized representative who has indicated his/her understanding and acceptance.     Plan Discussed with:   Anesthesia Plan Comments:         Anesthesia Quick Evaluation

## 2014-08-06 NOTE — Transfer of Care (Signed)
Immediate Anesthesia Transfer of Care Note  Patient: Wayne Pratt  Procedure(s) Performed: Procedure(s): ESOPHAGOGASTRODUODENOSCOPY (EGD) WITH PROPOFOL (N/A) BIOPSY  Patient Location: PACU  Anesthesia Type:MAC  Level of Consciousness: awake  Airway & Oxygen Therapy: Patient Spontanous Breathing and Patient connected to nasal cannula oxygen  Post-op Assessment: Report given to RN  Post vital signs: Reviewed and stable  Last Vitals:  Filed Vitals:   08/06/14 0730  BP: 134/87  Pulse:   Temp:   Resp: 11    Complications: No apparent anesthesia complications

## 2014-08-06 NOTE — Discharge Instructions (Signed)
You have gastritis & DUODENITIS. I biopsied your stomach. Your prep was not good. YOU NEED TO COME BACK.  WE WILL SCHEDULE YOUR COLONOSCOPY ON THUR JUL 28.   You will need to full liquids TODAY SEE INFO BELOW.   TODAY JUL 26: STARTING AT 12 NOON UNTIL 6 PM TODAY.Take Miralax 1 cap in 8 oz every hour for 6 hours. Drink 8 oz of liquid 30 minutes after each dose of Miralax. Dulcolax 10 mg AT 3 PM AND AT 5 PM.   WEDNESDAY: JUL 27 STARTING AT 12 NOON UNTIL 6 PM. START A CLEAR LIQUID DIET. Take Miralax  1 cap in 8 oz every hour for 6 hours. Drink 8 oz of liquid 30 minutes after each dose of Miralax. Dulcolax 10 mg AT 3 PM AND AT 5 PM.      Full Liquid Diet A high-calorie, high-protein supplement should be used to meet your nutritional requirements when the full liquid diet is continued for more than 2 or 3 days. If this diet is to be used for an extended period of time (more than 7 days), a multivitamin should be considered.    Vegetables  Allowed: Strained tomato or vegetable juice. Vegetables pureed in soup.   Avoid: Any others.    Fruit  Allowed: Any strained fruit juices and fruit drinks. Include 1 serving of citrus or vitamin C-enriched fruit juice daily.   Avoid: Any others.  Meat and Meat Substitutes  Allowed: Egg  Avoid: Any meat, fish, or fowl. All cheese.  Milk  Allowed: Milk beverages, including milk shakes and instant breakfast mixes. Smooth yogurt.   Avoid: Any others. Avoid dairy products if not tolerated.    Soups and Combination Foods  Allowed: Broth, strained cream soups. Strained, broth-based soups.   Avoid: Any others.    Desserts and Sweets  Allowed: flavored gelatin,plain ice cream, sherbet, smooth pudding, junket, fruit ices, frozen ice pops, pudding pops,, frozen fudge pops, chocolate syrup. Sugar, honey, jelly, syrup.   Avoid: Any others.  Fats and Oils  Allowed: Margarine, butter, cream, sour cream, oils.   Avoid: Any others.   Beverages  Allowed: All.   Avoid: None.  Condiments  Allowed: Iodized salt, pepper, spices, flavorings. Cocoa powder.   Avoid: Any others.    SAMPLE MEAL PLAN Breakfast   cup orange juice.   1 OR 2 EGGS   1 cup  milk.   1 cup beverage (coffee or tea).   Cream or sugar, if desired.    Midmorning Snack  CREAMY SOUP WITHOUT CHUNKS   Lunch  1 cup cream soup.    cup fruit juice.   1 cup milk.    cup custard.   1 cup beverage (coffee or tea).   Cream or sugar, if desired.    Midafternoon Snack  1 cup milk shake.  Dinner  1 cup cream soup.    cup fruit juice.   1 cup milk.    cup pudding.   1 cup beverage (coffee or tea).   Cream or sugar, if desired.  Evening Snack  1 cup supplement.  To increase calories, add sugar, cream, butter, or margarine if possible. Nutritional supplements will also increase the total calories.

## 2014-08-07 ENCOUNTER — Encounter (HOSPITAL_COMMUNITY): Payer: Self-pay

## 2014-08-07 ENCOUNTER — Encounter (HOSPITAL_COMMUNITY)
Admission: RE | Admit: 2014-08-07 | Discharge: 2014-08-07 | Disposition: A | Discharge status: Home / Self Care | Payer: PPO | Source: Ambulatory Visit | Attending: Gastroenterology | Admitting: Gastroenterology

## 2014-08-08 ENCOUNTER — Encounter (HOSPITAL_COMMUNITY): Admission: RE | Payer: Self-pay | Source: Ambulatory Visit

## 2014-08-08 ENCOUNTER — Telehealth: Payer: Self-pay | Admitting: Gastroenterology

## 2014-08-08 ENCOUNTER — Ambulatory Visit (HOSPITAL_COMMUNITY): Admission: RE | Admit: 2014-08-08 | Payer: PPO | Source: Ambulatory Visit | Admitting: Gastroenterology

## 2014-08-08 SURGERY — COLONOSCOPY WITH PROPOFOL
Anesthesia: Monitor Anesthesia Care

## 2014-08-08 NOTE — Telephone Encounter (Signed)
CALLED PT. HE HAD BLACK A TARRY STOOLS STARTED YESTERDAY. HAVING BLACK TARRY STOOLS  MOST YESTERDAY BUT BETTER TODAY. GO TO ED IF BLACK TARRY STOOL START AGAIN, EXPLAINED MOST LIKELY DUE TO BLOOD IN BOWEL FROM BIOPSY. [name] Kitchen    PT'S WIFE INSTRUCTED TO DO THE FOLLOWING:  THUR/FRI: SOFT MECHANICAL DIET FROM [email or web address]  SAT/SUN: FULL LIQUIDS    SUN: MIRALAX PREP MON: CLEAR LIQUIDS  MON: MIRALAX PREP  TUES AUG 2: TCS W/ MAC.

## 2014-08-08 NOTE — OR Nursing (Signed)
Patient called to say that  he followed instructions to get clear for the procedure.  Still has formed  Stool. Dr. Oneida Alar notified , procedure cancelled for now.  Dr. Oneida Alar to call patient.

## 2014-08-08 NOTE — Progress Notes (Signed)
REVIEWED-NO ADDITIONAL RECOMMENDATIONS. 

## 2014-08-09 ENCOUNTER — Other Ambulatory Visit: Payer: Self-pay

## 2014-08-09 NOTE — Telephone Encounter (Signed)
Spoke with wife Hilda Blades. She is aware of arrival time for pt.

## 2014-08-12 ENCOUNTER — Inpatient Hospital Stay (HOSPITAL_COMMUNITY): Admission: RE | Admit: 2014-08-12 | Payer: PPO | Source: Ambulatory Visit

## 2014-08-12 ENCOUNTER — Telehealth: Payer: Self-pay | Admitting: Gastroenterology

## 2014-08-12 NOTE — Telephone Encounter (Signed)
Routing to Dr. Fields.  

## 2014-08-12 NOTE — Telephone Encounter (Signed)
REVIEWED-NO ADDITIONAL RECOMMENDATIONS. 

## 2014-08-12 NOTE — Telephone Encounter (Signed)
Patient's wife called to let us know that patient wants to cancel his procedure with [facility] for tomorrow. I asked if he needed to reschedule and she said she would have to talk with him about it because he went without eating for "7 days" last time and he didn't want to go through that again. She said for now to just cancel procedure. [phone]

## 2014-08-13 ENCOUNTER — Ambulatory Visit (HOSPITAL_COMMUNITY): Admission: RE | Admit: 2014-08-13 | Payer: PPO | Source: Ambulatory Visit | Admitting: Gastroenterology

## 2014-08-13 ENCOUNTER — Encounter (HOSPITAL_COMMUNITY): Admission: RE | Payer: Self-pay | Source: Ambulatory Visit

## 2014-08-13 SURGERY — COLONOSCOPY WITH PROPOFOL
Anesthesia: Monitor Anesthesia Care

## 2014-09-20 ENCOUNTER — Telehealth: Payer: Self-pay | Admitting: Gastroenterology

## 2014-09-20 NOTE — Telephone Encounter (Signed)
Noted  

## 2014-09-20 NOTE — Telephone Encounter (Signed)
I requested last EGD and Path from [facility] and they have no records of this.

## 2015-02-26 DIAGNOSIS — Z79891 Long term (current) use of opiate analgesic: Secondary | ICD-10-CM | POA: Diagnosis not present

## 2015-02-26 DIAGNOSIS — G4700 Insomnia, unspecified: Secondary | ICD-10-CM | POA: Diagnosis not present

## 2015-02-26 DIAGNOSIS — E1142 Type 2 diabetes mellitus with diabetic polyneuropathy: Secondary | ICD-10-CM | POA: Diagnosis not present

## 2015-02-26 DIAGNOSIS — G894 Chronic pain syndrome: Secondary | ICD-10-CM | POA: Diagnosis not present

## 2015-04-23 DIAGNOSIS — E1142 Type 2 diabetes mellitus with diabetic polyneuropathy: Secondary | ICD-10-CM | POA: Diagnosis not present

## 2015-04-23 DIAGNOSIS — G894 Chronic pain syndrome: Secondary | ICD-10-CM | POA: Diagnosis not present

## 2015-04-23 DIAGNOSIS — Z79891 Long term (current) use of opiate analgesic: Secondary | ICD-10-CM | POA: Diagnosis not present

## 2015-04-23 DIAGNOSIS — G4700 Insomnia, unspecified: Secondary | ICD-10-CM | POA: Diagnosis not present

## 2015-05-19 ENCOUNTER — Other Ambulatory Visit: Payer: Self-pay | Admitting: Anesthesiology

## 2015-05-19 ENCOUNTER — Ambulatory Visit
Admission: RE | Admit: 2015-05-19 | Discharge: 2015-05-19 | Disposition: A | Discharge status: Home / Self Care | Payer: PPO | Source: Ambulatory Visit | Attending: Anesthesiology | Admitting: Anesthesiology

## 2015-05-19 DIAGNOSIS — G894 Chronic pain syndrome: Secondary | ICD-10-CM | POA: Diagnosis not present

## 2015-05-19 DIAGNOSIS — Z9689 Presence of other specified functional implants: Secondary | ICD-10-CM

## 2015-05-19 DIAGNOSIS — Z462 Encounter for fitting and adjustment of other devices related to nervous system and special senses: Secondary | ICD-10-CM | POA: Diagnosis not present

## 2015-05-19 DIAGNOSIS — E1142 Type 2 diabetes mellitus with diabetic polyneuropathy: Secondary | ICD-10-CM | POA: Diagnosis not present

## 2015-05-19 DIAGNOSIS — Z79891 Long term (current) use of opiate analgesic: Secondary | ICD-10-CM | POA: Diagnosis not present

## 2015-05-23 DIAGNOSIS — G894 Chronic pain syndrome: Secondary | ICD-10-CM | POA: Diagnosis not present

## 2015-05-23 DIAGNOSIS — Z79891 Long term (current) use of opiate analgesic: Secondary | ICD-10-CM | POA: Diagnosis not present

## 2015-05-23 DIAGNOSIS — E1142 Type 2 diabetes mellitus with diabetic polyneuropathy: Secondary | ICD-10-CM | POA: Diagnosis not present

## 2015-06-16 DIAGNOSIS — Z6829 Body mass index (BMI) 29.0-29.9, adult: Secondary | ICD-10-CM | POA: Diagnosis not present

## 2015-06-16 DIAGNOSIS — M792 Neuralgia and neuritis, unspecified: Secondary | ICD-10-CM | POA: Diagnosis not present

## 2015-06-19 ENCOUNTER — Encounter: Payer: Self-pay | Admitting: Gastroenterology

## 2015-06-20 DIAGNOSIS — Z79891 Long term (current) use of opiate analgesic: Secondary | ICD-10-CM | POA: Diagnosis not present

## 2015-06-20 DIAGNOSIS — E1142 Type 2 diabetes mellitus with diabetic polyneuropathy: Secondary | ICD-10-CM | POA: Diagnosis not present

## 2015-06-20 DIAGNOSIS — G894 Chronic pain syndrome: Secondary | ICD-10-CM | POA: Diagnosis not present

## 2015-06-20 DIAGNOSIS — F411 Generalized anxiety disorder: Secondary | ICD-10-CM | POA: Diagnosis not present

## 2015-07-22 DIAGNOSIS — F411 Generalized anxiety disorder: Secondary | ICD-10-CM | POA: Diagnosis not present

## 2015-07-22 DIAGNOSIS — Z79891 Long term (current) use of opiate analgesic: Secondary | ICD-10-CM | POA: Diagnosis not present

## 2015-07-22 DIAGNOSIS — E1142 Type 2 diabetes mellitus with diabetic polyneuropathy: Secondary | ICD-10-CM | POA: Diagnosis not present

## 2015-07-22 DIAGNOSIS — G894 Chronic pain syndrome: Secondary | ICD-10-CM | POA: Diagnosis not present

## 2015-07-23 ENCOUNTER — Other Ambulatory Visit: Payer: Self-pay | Admitting: Neurological Surgery

## 2015-07-30 ENCOUNTER — Encounter (HOSPITAL_COMMUNITY)
Admission: RE | Admit: 2015-07-30 | Discharge: 2015-07-30 | Disposition: A | Discharge status: Home / Self Care | Payer: PPO | Source: Ambulatory Visit | Attending: Neurological Surgery | Admitting: Neurological Surgery

## 2015-07-30 ENCOUNTER — Encounter (HOSPITAL_COMMUNITY): Payer: Self-pay

## 2015-07-30 DIAGNOSIS — G4700 Insomnia, unspecified: Secondary | ICD-10-CM | POA: Diagnosis not present

## 2015-07-30 DIAGNOSIS — I10 Essential (primary) hypertension: Secondary | ICD-10-CM | POA: Diagnosis not present

## 2015-07-30 DIAGNOSIS — Z7982 Long term (current) use of aspirin: Secondary | ICD-10-CM | POA: Diagnosis not present

## 2015-07-30 DIAGNOSIS — E1142 Type 2 diabetes mellitus with diabetic polyneuropathy: Secondary | ICD-10-CM | POA: Diagnosis not present

## 2015-07-30 DIAGNOSIS — M1990 Unspecified osteoarthritis, unspecified site: Secondary | ICD-10-CM | POA: Diagnosis not present

## 2015-07-30 DIAGNOSIS — E7800 Pure hypercholesterolemia, unspecified: Secondary | ICD-10-CM | POA: Diagnosis not present

## 2015-07-30 DIAGNOSIS — F419 Anxiety disorder, unspecified: Secondary | ICD-10-CM | POA: Diagnosis not present

## 2015-07-30 DIAGNOSIS — F17210 Nicotine dependence, cigarettes, uncomplicated: Secondary | ICD-10-CM | POA: Diagnosis not present

## 2015-07-30 DIAGNOSIS — J9811 Atelectasis: Secondary | ICD-10-CM | POA: Diagnosis not present

## 2015-07-30 DIAGNOSIS — M549 Dorsalgia, unspecified: Secondary | ICD-10-CM

## 2015-07-30 DIAGNOSIS — Z7984 Long term (current) use of oral hypoglycemic drugs: Secondary | ICD-10-CM | POA: Diagnosis not present

## 2015-07-30 DIAGNOSIS — G8929 Other chronic pain: Secondary | ICD-10-CM | POA: Diagnosis not present

## 2015-07-30 DIAGNOSIS — G4730 Sleep apnea, unspecified: Secondary | ICD-10-CM | POA: Diagnosis not present

## 2015-07-30 DIAGNOSIS — Z880 Allergy status to penicillin: Secondary | ICD-10-CM | POA: Diagnosis not present

## 2015-07-30 HISTORY — DX: Weakness: R53.1

## 2015-07-30 HISTORY — DX: Personal history of other diseases of the respiratory system: Z87.09

## 2015-07-30 HISTORY — DX: Pneumonia, unspecified organism: J18.9

## 2015-07-30 HISTORY — DX: Unspecified osteoarthritis, unspecified site: M19.90

## 2015-07-30 HISTORY — DX: Personal history of other diseases of the circulatory system: Z86.79

## 2015-07-30 HISTORY — DX: Anxiety disorder, unspecified: F41.9

## 2015-07-30 HISTORY — DX: Insomnia, unspecified: G47.00

## 2015-07-30 HISTORY — DX: Personal history of urinary calculi: Z87.442

## 2015-07-30 LAB — BASIC METABOLIC PANEL
ANION GAP: 7 (ref 5–15)
BUN: 14 mg/dL (ref 6–20)
CHLORIDE: 103 mmol/L (ref 101–111)
CO2: 27 mmol/L (ref 22–32)
Calcium: 9.2 mg/dL (ref 8.9–10.3)
Creatinine, Ser: 0.99 mg/dL (ref 0.61–1.24)
GFR calc Af Amer: >60 mL/min (ref >60)
Glucose, Bld: 288 mg/dL — ABNORMAL HIGH (ref 65–99)
POTASSIUM: 4.6 mmol/L (ref 3.5–5.1)
SODIUM: 137 mmol/L (ref 135–145)

## 2015-07-30 LAB — CBC WITH DIFFERENTIAL/PLATELET
BASOS ABS: 0 10*3/uL (ref 0.0–0.1)
Basophils Relative: 0 %
EOS PCT: 2 %
Eosinophils Absolute: 0.2 10*3/uL (ref 0.0–0.7)
HEMATOCRIT: 44.5 % (ref 39.0–52.0)
HEMOGLOBIN: 15 g/dL (ref 13.0–17.0)
LYMPHS ABS: 1.8 10*3/uL (ref 0.7–4.0)
LYMPHS PCT: 23 %
MCH: 29.9 pg (ref 26.0–34.0)
MCHC: 33.7 g/dL (ref 30.0–36.0)
MCV: 88.6 fL (ref 78.0–100.0)
Monocytes Absolute: 0.4 10*3/uL (ref 0.1–1.0)
Monocytes Relative: 5 %
NEUTROS ABS: 5.5 10*3/uL (ref 1.7–7.7)
NEUTROS PCT: 70 %
PLATELETS: 179 10*3/uL (ref 150–400)
RBC: 5.02 MIL/uL (ref 4.22–5.81)
RDW: 12.6 % (ref 11.5–15.5)
WBC: 7.9 10*3/uL (ref 4.0–10.5)

## 2015-07-30 LAB — PROTIME-INR
INR: 1.01 (ref 0.00–1.49)
Prothrombin Time: 13.5 seconds (ref 11.6–15.2)

## 2015-07-30 LAB — GLUCOSE, CAPILLARY: GLUCOSE-CAPILLARY: 264 mg/dL — AB (ref 65–99)

## 2015-07-30 LAB — SURGICAL PCR SCREEN
MRSA, PCR: NEGATIVE
STAPHYLOCOCCUS AUREUS: NEGATIVE

## 2015-07-30 MED ORDER — CHLORHEXIDINE GLUCONATE CLOTH 2 % EX PADS: 6.0000 | MEDICATED_PAD | Freq: Once | CUTANEOUS | Status: DC

## 2015-07-30 NOTE — Progress Notes (Addendum)
Cardiologist denies   Medical Md is [place]  Echo denies  Stress test > 10 yrs ago.Per pt and wife test was normal  Heart cath denies  EKG denies in past yr  CXR denies in past yr

## 2015-07-30 NOTE — Pre-Procedure Instructions (Signed)
Germaine Ramchandani Wojtaszek  07/30/2015      [facility] - [place] [address], [place] [address] Phone: [phone] Fax: [phone]    Your procedure is scheduled on Fri, July 21 @ 11:30 AM  Report to [facility] Admitting at 9:30 AM  Call this number if you have problems the morning of surgery:  [phone]   Remember:  Do not eat food or drink liquids after midnight.  Take these medicines the morning of surgery with A SIP OF WATER Alprazolam(Xanax) and Pain Pill(if needed)             Stop taking your Aspirin. No Goody's,BC's,Aleve,Advil,Motrin,Ibuprofen,Fish Oil,or any Herbal Medications.    Do not wear jewelry.  Do not wear lotions, powders, or colognes.   Men may shave face and neck.  Do not bring valuables to the hospital.  [facility] is not responsible for any belongings or valuables.  Contacts, dentures or bridgework may not be worn into surgery.  Leave your suitcase in the car.  After surgery it may be brought to your room.  For patients admitted to the hospital, discharge time will be determined by your treatment team.  Patients discharged the day of surgery will not be allowed to drive home.    [facility] - [facility]  Before surgery, you can play an important role.  Because skin is not sterile, your skin needs to be as free of germs as possible.  You can reduce the number of germs on you skin by washing with CHG (chlorahexidine gluconate) soap before surgery.  CHG is an antiseptic cleaner which kills germs and bonds with the skin to continue killing germs even after washing.  Please DO NOT use if you have an allergy to CHG or antibacterial soaps.  If your skin becomes reddened/irritated stop using the CHG and inform your nurse when you arrive at Short Stay.  Do not shave (including legs and underarms) for at least 48 hours prior to the first CHG shower.  You may shave your face.  Please  follow these instructions carefully:   1.  Shower with CHG Soap the night before surgery and the                                morning of Surgery.  2.  If you choose to wash your hair, wash your hair first as usual with your       normal shampoo.  3.  After you shampoo, rinse your hair and body thoroughly to remove the                      Shampoo.  4.  Use CHG as you would any other liquid soap.  You can apply chg directly       to the skin and wash gently with scrungie or a clean washcloth.  5.  Apply the CHG Soap to your body ONLY FROM THE NECK DOWN.        Do not use on open wounds or open sores.  Avoid contact with your eyes,       ears, mouth and genitals (private parts).  Wash genitals (private parts)       with your normal soap.  6.  Wash thoroughly, paying special attention to the area where your surgery  will be performed.  7.  Thoroughly rinse your body with warm water from the neck down.  8.  DO NOT shower/wash with your normal soap after using and rinsing off       the CHG Soap.  9.  Pat yourself dry with a clean towel.            10.  Wear clean pajamas.            11.  Place clean sheets on your bed the night of your first shower and do not        sleep with pets.  Day of Surgery  Do not apply any lotions/deoderants the morning of surgery.  Please wear clean clothes to the hospital/surgery center.     Please read over the following fact sheets that you were given. Pain Booklet, Coughing and Deep Breathing, MRSA Information and Surgical Site Infection Prevention

## 2015-07-31 LAB — HEMOGLOBIN A1C
HEMOGLOBIN A1C: 11.4 % — AB (ref 4.8–5.6)
Mean Plasma Glucose: 280 mg/dL

## 2015-07-31 NOTE — Progress Notes (Signed)
Anesthesia Chart Review: Patient is a 55 year old male scheduled for spinal cord stimulator implant on 08/01/15 by Dr. Ronnald Ramp.  History includes smoking, hypercholesterolemia, anxiety, DM2, neuropathy, insomnia, nephrolithiasis, right empyema s/p R VATS/decortication '10. PCP will be with [facility]--he is in the process of switching PCP and is suppose to drop of his old medical records there on Monday to get assigned a new PCP (previously seen at [facility]).    Meds include Xanax, ASA 325mg  (on hold), fentanyl patch, iron, metformin, oxycodone, trazodone.  PAT Vitals: BP 138/88, HR 99, RR 20, T 37C, O2 sat 99%. CBG 264. BMI 29.56.  07/30/15 EKG: NSR. He reported a normal stress test > 10 years ago.  11/22/06 Echo: LV size at upper limit of normal, mild concentric LVH, normal regional and global LV systolic function, very mild LAE, mild MR, trileaflet and normal AV, normal diameter of the proximal ascending aorta, mild to moderate calcification of the wall and annulus.  07/30/15 CXR: IMPRESSION: Mild bibasilar subsegmental atelectasis. Right base pleural-parenchymal thickening consistent with scarring.  Preoperative labs noted. Cr 0.99. CBC, PT/INR WNL. Glucose 288 with A1c 11.4 consistent with mean plasma glucose of 280. I called and spoke with patient. He reports an intentional weight loss of 50 pounds last year with subsequent A1c down to 6.1. He was not longer taking metformin, but over the past few weeks had noticed that his fasting CBGs were getting higher and resumed metformin a week ago. His fasting CBG have been averaging 219. He denied fasting CBG of 250 or greater. He denied visual changes or polydipsia, polyuria. He denied any recent weight changes. He was last on steroids for URI in 03/2015. He was alarmed that his A1c had jumped to 11 from 6 in the past year. He was encouraged to make his PCP follow-up visit sooner than later to get DM better controlled. Hopefully since  metformin just resumed then he will begin to notice some improvement, but may need dose adjustment or another agent. We discussed that ideally his fasting CBG should be < 200 for surgery, but close to or > 250 could get his surgery canceled. In addition, Dr. Ronnald Ramp may prefer to delay his surgery until A1c is improving since uncontrolled DM could increase his risk of infection or wound healing issues. I told him the Dr. Ronnald Ramp' office would contact him with any additional recommendations. I have notified Lorriane Shire at Dr. Ronnald Ramp' office of A1c 11.4, fasting CBGs averaging 219, patient resuming metformin one week ago, and that his surgery could get canceled if patient arrives with significantly elevated fasting CBG. She will review with Dr. Ronnald Ramp and let patient know if he has additional input.  [facility] Short Stay Center/Anesthesiology Phone [phone] 07/31/2015 10:28 AM

## 2015-08-01 ENCOUNTER — Encounter (HOSPITAL_COMMUNITY): Payer: Self-pay | Admitting: *Deleted

## 2015-08-01 ENCOUNTER — Ambulatory Visit (HOSPITAL_COMMUNITY): Payer: PPO | Admitting: Certified Registered"

## 2015-08-01 ENCOUNTER — Ambulatory Visit (HOSPITAL_COMMUNITY)
Admission: RE | Admit: 2015-08-01 | Discharge: 2015-08-02 | Disposition: A | Discharge status: Home / Self Care | Payer: PPO | Source: Ambulatory Visit | Attending: Neurological Surgery | Admitting: Neurological Surgery

## 2015-08-01 ENCOUNTER — Ambulatory Visit (HOSPITAL_COMMUNITY): Payer: PPO | Admitting: Vascular Surgery

## 2015-08-01 ENCOUNTER — Encounter (HOSPITAL_COMMUNITY)
Admission: RE | Disposition: A | Discharge status: Home / Self Care | Payer: Self-pay | Source: Ambulatory Visit | Attending: Neurological Surgery

## 2015-08-01 ENCOUNTER — Ambulatory Visit (HOSPITAL_COMMUNITY): Payer: PPO

## 2015-08-01 DIAGNOSIS — E7800 Pure hypercholesterolemia, unspecified: Secondary | ICD-10-CM | POA: Insufficient documentation

## 2015-08-01 DIAGNOSIS — F419 Anxiety disorder, unspecified: Secondary | ICD-10-CM | POA: Diagnosis not present

## 2015-08-01 DIAGNOSIS — Z9689 Presence of other specified functional implants: Secondary | ICD-10-CM

## 2015-08-01 DIAGNOSIS — E1142 Type 2 diabetes mellitus with diabetic polyneuropathy: Secondary | ICD-10-CM | POA: Insufficient documentation

## 2015-08-01 DIAGNOSIS — D649 Anemia, unspecified: Secondary | ICD-10-CM | POA: Diagnosis not present

## 2015-08-01 DIAGNOSIS — G4730 Sleep apnea, unspecified: Secondary | ICD-10-CM | POA: Diagnosis not present

## 2015-08-01 DIAGNOSIS — G8929 Other chronic pain: Secondary | ICD-10-CM | POA: Diagnosis not present

## 2015-08-01 DIAGNOSIS — F17210 Nicotine dependence, cigarettes, uncomplicated: Secondary | ICD-10-CM | POA: Insufficient documentation

## 2015-08-01 DIAGNOSIS — M1990 Unspecified osteoarthritis, unspecified site: Secondary | ICD-10-CM | POA: Diagnosis not present

## 2015-08-01 DIAGNOSIS — Z969 Presence of functional implant, unspecified: Secondary | ICD-10-CM | POA: Diagnosis not present

## 2015-08-01 DIAGNOSIS — Z9889 Other specified postprocedural states: Secondary | ICD-10-CM

## 2015-08-01 DIAGNOSIS — G4700 Insomnia, unspecified: Secondary | ICD-10-CM | POA: Diagnosis not present

## 2015-08-01 DIAGNOSIS — Z7984 Long term (current) use of oral hypoglycemic drugs: Secondary | ICD-10-CM | POA: Diagnosis not present

## 2015-08-01 DIAGNOSIS — Z880 Allergy status to penicillin: Secondary | ICD-10-CM | POA: Diagnosis not present

## 2015-08-01 DIAGNOSIS — M549 Dorsalgia, unspecified: Secondary | ICD-10-CM | POA: Diagnosis not present

## 2015-08-01 DIAGNOSIS — Z419 Encounter for procedure for purposes other than remedying health state, unspecified: Secondary | ICD-10-CM

## 2015-08-01 DIAGNOSIS — M792 Neuralgia and neuritis, unspecified: Secondary | ICD-10-CM | POA: Diagnosis not present

## 2015-08-01 DIAGNOSIS — I10 Essential (primary) hypertension: Secondary | ICD-10-CM | POA: Diagnosis not present

## 2015-08-01 DIAGNOSIS — Z7982 Long term (current) use of aspirin: Secondary | ICD-10-CM | POA: Insufficient documentation

## 2015-08-01 HISTORY — PX: SPINAL CORD STIMULATOR INSERTION: SHX5378

## 2015-08-01 LAB — GLUCOSE, CAPILLARY
GLUCOSE-CAPILLARY: 262 mg/dL — AB (ref 65–99)
GLUCOSE-CAPILLARY: 289 mg/dL — AB (ref 65–99)
GLUCOSE-CAPILLARY: 344 mg/dL — AB (ref 65–99)
Glucose-Capillary: 314 mg/dL — ABNORMAL HIGH (ref 65–99)

## 2015-08-01 SURGERY — INSERTION, SPINAL CORD STIMULATOR, LUMBAR
Anesthesia: General | Site: Spine Lumbar

## 2015-08-01 MED ORDER — NEOSTIGMINE METHYLSULFATE 10 MG/10ML IV SOLN
INTRAVENOUS | Status: DC | PRN
  Administered 2015-08-01: 4 mg via INTRAVENOUS

## 2015-08-01 MED ORDER — HEMOSTATIC AGENTS (NO CHARGE) OPTIME
TOPICAL | Status: DC | PRN
  Administered 2015-08-01: 1 via TOPICAL

## 2015-08-01 MED ORDER — LIDOCAINE 2% (20 MG/ML) 5 ML SYRINGE
INTRAMUSCULAR | Status: AC
  Filled 2015-08-01: qty 5

## 2015-08-01 MED ORDER — ONDANSETRON HCL 4 MG/2ML IJ SOLN: 4.0000 mg | INTRAMUSCULAR | Status: DC | PRN

## 2015-08-01 MED ORDER — ONDANSETRON HCL 4 MG/2ML IJ SOLN
INTRAMUSCULAR | Status: AC
  Filled 2015-08-01: qty 2

## 2015-08-01 MED ORDER — METFORMIN HCL ER 500 MG PO TB24
500.0000 mg | ORAL_TABLET | Freq: Every day | ORAL | Status: DC
  Administered 2015-08-02: 500 mg via ORAL
  Filled 2015-08-01: qty 1

## 2015-08-01 MED ORDER — ACETAMINOPHEN 325 MG PO TABS: 650.0000 mg | ORAL_TABLET | ORAL | Status: DC | PRN

## 2015-08-01 MED ORDER — PHENOL 1.4 % MT LIQD
1.0000 | OROMUCOSAL | Status: DC | PRN
Start: 2015-08-01 — End: 2015-08-02

## 2015-08-01 MED ORDER — LABETALOL HCL 5 MG/ML IV SOLN
INTRAVENOUS | Status: DC | PRN
  Administered 2015-08-01: 10 mg via INTRAVENOUS

## 2015-08-01 MED ORDER — INSULIN ASPART 100 UNIT/ML ~~LOC~~ SOLN
0.0000 [IU] | Freq: Every day | SUBCUTANEOUS | Status: DC
  Administered 2015-08-01: 4 [IU] via SUBCUTANEOUS

## 2015-08-01 MED ORDER — INSULIN ASPART 100 UNIT/ML ~~LOC~~ SOLN
0.0000 [IU] | Freq: Three times a day (TID) | SUBCUTANEOUS | Status: DC
  Administered 2015-08-02: 5 [IU] via SUBCUTANEOUS

## 2015-08-01 MED ORDER — ROCURONIUM BROMIDE 100 MG/10ML IV SOLN
INTRAVENOUS | Status: DC | PRN
  Administered 2015-08-01: 10 mg via INTRAVENOUS
  Administered 2015-08-01: 30 mg via INTRAVENOUS

## 2015-08-01 MED ORDER — DEXAMETHASONE SODIUM PHOSPHATE 10 MG/ML IJ SOLN: 10.0000 mg | INTRAMUSCULAR | Status: DC

## 2015-08-01 MED ORDER — MORPHINE SULFATE (PF) 2 MG/ML IV SOLN
1.0000 mg | INTRAVENOUS | Status: DC | PRN
Start: 2015-08-01 — End: 2015-08-02
  Administered 2015-08-01 (×2): 4 mg via INTRAVENOUS
  Administered 2015-08-02: 2 mg via INTRAVENOUS
  Filled 2015-08-01: qty 2
  Filled 2015-08-01: qty 1
  Filled 2015-08-01: qty 2

## 2015-08-01 MED ORDER — ARTIFICIAL TEARS OP OINT
TOPICAL_OINTMENT | OPHTHALMIC | Status: AC
  Filled 2015-08-01: qty 3.5

## 2015-08-01 MED ORDER — VANCOMYCIN HCL IN DEXTROSE 1-5 GM/200ML-% IV SOLN
INTRAVENOUS | Status: AC
  Filled 2015-08-01: qty 200

## 2015-08-01 MED ORDER — PROMETHAZINE HCL 25 MG/ML IJ SOLN: 6.2500 mg | INTRAMUSCULAR | Status: DC | PRN

## 2015-08-01 MED ORDER — BUPIVACAINE HCL (PF) 0.25 % IJ SOLN
INTRAMUSCULAR | Status: DC | PRN
  Administered 2015-08-01: 4 mL

## 2015-08-01 MED ORDER — FENTANYL CITRATE (PF) 250 MCG/5ML IJ SOLN
INTRAMUSCULAR | Status: AC
  Filled 2015-08-01: qty 5

## 2015-08-01 MED ORDER — MIDAZOLAM HCL 2 MG/2ML IJ SOLN
INTRAMUSCULAR | Status: AC
  Filled 2015-08-01: qty 2

## 2015-08-01 MED ORDER — MIDAZOLAM HCL 5 MG/5ML IJ SOLN
INTRAMUSCULAR | Status: DC | PRN
  Administered 2015-08-01: 2 mg via INTRAVENOUS

## 2015-08-01 MED ORDER — ALPRAZOLAM 0.5 MG PO TABS
1.0000 mg | ORAL_TABLET | Freq: Three times a day (TID) | ORAL | Status: DC
  Administered 2015-08-01 – 2015-08-02 (×3): 1 mg via ORAL
  Filled 2015-08-01 (×3): qty 2

## 2015-08-01 MED ORDER — SODIUM CHLORIDE 0.9% FLUSH: 3.0000 mL | INTRAVENOUS | Status: DC | PRN

## 2015-08-01 MED ORDER — GLYCOPYRROLATE 0.2 MG/ML IJ SOLN
INTRAMUSCULAR | Status: DC | PRN
  Administered 2015-08-01: 0.6 mg via INTRAVENOUS

## 2015-08-01 MED ORDER — VANCOMYCIN HCL IN DEXTROSE 1-5 GM/200ML-% IV SOLN
1000.0000 mg | INTRAVENOUS | Status: AC
  Administered 2015-08-01: 1000 mg via INTRAVENOUS

## 2015-08-01 MED ORDER — TRAZODONE 25 MG HALF TABLET
25.0000 mg | ORAL_TABLET | Freq: Every day | ORAL | Status: DC
  Administered 2015-08-01: 25 mg via ORAL
  Filled 2015-08-01: qty 1

## 2015-08-01 MED ORDER — ONDANSETRON HCL 4 MG/2ML IJ SOLN
INTRAMUSCULAR | Status: DC | PRN
  Administered 2015-08-01: 4 mg via INTRAVENOUS

## 2015-08-01 MED ORDER — DIAZEPAM 5 MG/ML IJ SOLN
2.5000 mg | Freq: Once | INTRAMUSCULAR | Status: AC
  Administered 2015-08-01: 2.5 mg via INTRAVENOUS

## 2015-08-01 MED ORDER — SODIUM CHLORIDE 0.9 % IR SOLN
Status: DC | PRN
  Administered 2015-08-01: 12:00:00

## 2015-08-01 MED ORDER — DIAZEPAM 5 MG/ML IJ SOLN
INTRAMUSCULAR | Status: AC
  Administered 2015-08-01: 2.5 mg
  Filled 2015-08-01: qty 2

## 2015-08-01 MED ORDER — ROCURONIUM BROMIDE 50 MG/5ML IV SOLN
INTRAVENOUS | Status: AC
  Filled 2015-08-01: qty 1

## 2015-08-01 MED ORDER — THROMBIN 5000 UNITS EX SOLR
OROMUCOSAL | Status: DC | PRN
  Administered 2015-08-01: 12:00:00 via TOPICAL

## 2015-08-01 MED ORDER — THROMBIN 5000 UNITS EX SOLR
CUTANEOUS | Status: DC | PRN
  Administered 2015-08-01 (×2): 5000 [IU] via TOPICAL

## 2015-08-01 MED ORDER — GLYCOPYRROLATE 0.2 MG/ML IV SOSY
PREFILLED_SYRINGE | INTRAVENOUS | Status: AC
  Filled 2015-08-01: qty 3

## 2015-08-01 MED ORDER — PROPOFOL 10 MG/ML IV BOLUS
INTRAVENOUS | Status: AC
  Filled 2015-08-01: qty 20

## 2015-08-01 MED ORDER — DEXAMETHASONE SODIUM PHOSPHATE 10 MG/ML IJ SOLN
INTRAMUSCULAR | Status: AC
  Administered 2015-08-01: 10 mg via INTRAVENOUS
  Filled 2015-08-01: qty 1

## 2015-08-01 MED ORDER — VANCOMYCIN HCL IN DEXTROSE 1-5 GM/200ML-% IV SOLN
1000.0000 mg | Freq: Once | INTRAVENOUS | Status: AC
  Administered 2015-08-01: 1000 mg via INTRAVENOUS
  Filled 2015-08-01: qty 200

## 2015-08-01 MED ORDER — POTASSIUM CHLORIDE IN NACL 20-0.9 MEQ/L-% IV SOLN
INTRAVENOUS | Status: DC
  Filled 2015-08-01 (×3): qty 1000

## 2015-08-01 MED ORDER — HYDROMORPHONE HCL 1 MG/ML IJ SOLN
INTRAMUSCULAR | Status: AC
  Filled 2015-08-01: qty 1

## 2015-08-01 MED ORDER — 0.9 % SODIUM CHLORIDE (POUR BTL) OPTIME
TOPICAL | Status: DC | PRN
  Administered 2015-08-01: 1000 mL

## 2015-08-01 MED ORDER — MENTHOL 3 MG MT LOZG: 1.0000 | LOZENGE | OROMUCOSAL | Status: DC | PRN

## 2015-08-01 MED ORDER — PROPOFOL 10 MG/ML IV BOLUS
INTRAVENOUS | Status: DC | PRN
  Administered 2015-08-01: 160 mg via INTRAVENOUS

## 2015-08-01 MED ORDER — HYDROMORPHONE HCL 1 MG/ML IJ SOLN
0.2500 mg | INTRAMUSCULAR | Status: DC | PRN
  Administered 2015-08-01 (×4): 0.5 mg via INTRAVENOUS

## 2015-08-01 MED ORDER — LACTATED RINGERS IV SOLN
INTRAVENOUS | Status: DC
  Administered 2015-08-01: 10:00:00 via INTRAVENOUS

## 2015-08-01 MED ORDER — LACTATED RINGERS IV SOLN
INTRAVENOUS | Status: DC | PRN
  Administered 2015-08-01: 11:00:00 via INTRAVENOUS

## 2015-08-01 MED ORDER — LIVING WELL WITH DIABETES BOOK
Freq: Once | Status: DC
  Filled 2015-08-01: qty 1

## 2015-08-01 MED ORDER — ARTIFICIAL TEARS OP OINT
TOPICAL_OINTMENT | OPHTHALMIC | Status: DC | PRN
  Administered 2015-08-01: 1 via OPHTHALMIC

## 2015-08-01 MED ORDER — VANCOMYCIN HCL 1000 MG IV SOLR
INTRAVENOUS | Status: DC | PRN
  Administered 2015-08-01: 1000 mg via TOPICAL

## 2015-08-01 MED ORDER — NEOSTIGMINE METHYLSULFATE 5 MG/5ML IV SOSY
PREFILLED_SYRINGE | INTRAVENOUS | Status: AC
Start: 2015-08-01 — End: 2015-08-01
  Filled 2015-08-01: qty 5

## 2015-08-01 MED ORDER — OXYCODONE HCL 5 MG PO TABS
10.0000 mg | ORAL_TABLET | ORAL | Status: DC
  Administered 2015-08-01 – 2015-08-02 (×4): 10 mg via ORAL
  Filled 2015-08-01 (×4): qty 2

## 2015-08-01 MED ORDER — FENTANYL CITRATE (PF) 100 MCG/2ML IJ SOLN
INTRAMUSCULAR | Status: DC | PRN
  Administered 2015-08-01: 100 ug via INTRAVENOUS
  Administered 2015-08-01 (×3): 50 ug via INTRAVENOUS

## 2015-08-01 MED ORDER — VANCOMYCIN HCL 1000 MG IV SOLR
INTRAVENOUS | Status: AC
  Filled 2015-08-01: qty 1000

## 2015-08-01 MED ORDER — LIDOCAINE HCL (CARDIAC) 20 MG/ML IV SOLN
INTRAVENOUS | Status: DC | PRN
  Administered 2015-08-01: 80 mg via INTRAVENOUS

## 2015-08-01 MED ORDER — SUCCINYLCHOLINE CHLORIDE 20 MG/ML IJ SOLN
INTRAMUSCULAR | Status: DC | PRN
  Administered 2015-08-01: 100 mg via INTRAVENOUS

## 2015-08-01 MED ORDER — ACETAMINOPHEN 650 MG RE SUPP
650.0000 mg | RECTAL | Status: DC | PRN
Start: 2015-08-01 — End: 2015-08-02

## 2015-08-01 MED ORDER — SODIUM CHLORIDE 0.9% FLUSH
3.0000 mL | Freq: Two times a day (BID) | INTRAVENOUS | Status: DC
  Administered 2015-08-01: 3 mL via INTRAVENOUS

## 2015-08-01 SURGICAL SUPPLY — 54 items
ADH SKN CLS APL DERMABOND .7 (GAUZE/BANDAGES/DRESSINGS) ×1
APL SKNCLS STERI-STRIP NONHPOA (GAUZE/BANDAGES/DRESSINGS) ×1
BAG DECANTER FOR FLEXI CONT (MISCELLANEOUS) ×2 IMPLANT
BENZOIN TINCTURE PRP APPL 2/3 (GAUZE/BANDAGES/DRESSINGS) ×2 IMPLANT
BUR MATCHSTICK NEURO 3.0 LAGG (BURR) ×2 IMPLANT
CANISTER SUCT 3000ML PPV (MISCELLANEOUS) ×2 IMPLANT
DERMABOND ADVANCED (GAUZE/BANDAGES/DRESSINGS) ×1
DERMABOND ADVANCED .7 DNX12 (GAUZE/BANDAGES/DRESSINGS) IMPLANT
DRAPE C-ARM 42X72 X-RAY (DRAPES) ×4 IMPLANT
DRAPE LAPAROTOMY 100X72X124 (DRAPES) ×2 IMPLANT
DRAPE POUCH INSTRU U-SHP 10X18 (DRAPES) ×2 IMPLANT
DRAPE SURG 17X23 STRL (DRAPES) ×2 IMPLANT
DRSG OPSITE 4X5.5 SM (GAUZE/BANDAGES/DRESSINGS) ×2 IMPLANT
DURAPREP 26ML APPLICATOR (WOUND CARE) ×2 IMPLANT
ELECT REM PT RETURN 9FT ADLT (ELECTROSURGICAL) ×2
ELECTRODE REM PT RTRN 9FT ADLT (ELECTROSURGICAL) ×1 IMPLANT
ELEVATER PASSER (SPINAL CORD STIMULATOR) ×2
GAUZE SPONGE 4X4 16PLY XRAY LF (GAUZE/BANDAGES/DRESSINGS) IMPLANT
GLOVE BIO SURGEON STRL SZ8 (GLOVE) ×3 IMPLANT
GOWN STRL REUS W/ TWL LRG LVL3 (GOWN DISPOSABLE) IMPLANT
GOWN STRL REUS W/ TWL XL LVL3 (GOWN DISPOSABLE) ×1 IMPLANT
GOWN STRL REUS W/TWL 2XL LVL3 (GOWN DISPOSABLE) ×1 IMPLANT
GOWN STRL REUS W/TWL LRG LVL3 (GOWN DISPOSABLE) ×4
GOWN STRL REUS W/TWL XL LVL3 (GOWN DISPOSABLE) ×6
HEMOSTAT POWDER KIT SURGIFOAM (HEMOSTASIS) IMPLANT
IPG PRECISION SPECTRA (Stimulator) ×1 IMPLANT
KIT BASIN OR (CUSTOM PROCEDURE TRAY) ×2 IMPLANT
KIT CHARGING (KITS) ×1
KIT CHARGING PRECISION NEURO (KITS) IMPLANT
KIT PAT PROGRAM FREELINK (KITS) IMPLANT
KIT ROOM TURNOVER OR (KITS) ×2 IMPLANT
LEAD COVER EDGE 50CM STIM KIT (Lead) ×1 IMPLANT
NDL HYPO 25X1 1.5 SAFETY (NEEDLE) ×1 IMPLANT
NDL SPNL 20GX3.5 QUINCKE YW (NEEDLE) IMPLANT
NEEDLE HYPO 25X1 1.5 SAFETY (NEEDLE) ×2 IMPLANT
NEEDLE SPNL 20GX3.5 QUINCKE YW (NEEDLE) IMPLANT
NS IRRIG 1000ML POUR BTL (IV SOLUTION) ×2 IMPLANT
PACK LAMINECTOMY NEURO (CUSTOM PROCEDURE TRAY) ×2 IMPLANT
PAD ARMBOARD 7.5X6 YLW CONV (MISCELLANEOUS) ×6 IMPLANT
PADDLE BLANK SIMULATOR LEAD 32 (MISCELLANEOUS) ×1 IMPLANT
PASSER ELEVATOR (SPINAL CORD STIMULATOR) IMPLANT
REMOTE CONTROL KIT (KITS) ×2
RUBBERBAND STERILE (MISCELLANEOUS) ×4 IMPLANT
SPONGE SURGIFOAM ABS GEL SZ50 (HEMOSTASIS) ×2 IMPLANT
STRIP CLOSURE SKIN 1/2X4 (GAUZE/BANDAGES/DRESSINGS) ×2 IMPLANT
SUT SILK 3 0 SH 30 (SUTURE) ×1 IMPLANT
SUT VIC AB 0 CT1 18XCR BRD8 (SUTURE) ×1 IMPLANT
SUT VIC AB 0 CT1 8-18 (SUTURE) ×2
SUT VIC AB 2-0 CP2 18 (SUTURE) ×2 IMPLANT
SUT VIC AB 3-0 SH 8-18 (SUTURE) ×4 IMPLANT
TOOL LONG TUNNEL (SPINAL CORD STIMULATOR) ×1 IMPLANT
TOWEL OR 17X24 6PK STRL BLUE (TOWEL DISPOSABLE) ×2 IMPLANT
TOWEL OR 17X26 10 PK STRL BLUE (TOWEL DISPOSABLE) ×2 IMPLANT
WATER STERILE IRR 1000ML POUR (IV SOLUTION) ×2 IMPLANT

## 2015-08-01 NOTE — Anesthesia Procedure Notes (Addendum)
Procedure Name: Intubation Date/Time: 08/01/2015 11:24 AM Performed by: Gaylene Brooks Pre-anesthesia Checklist: Patient identified, Emergency Drugs available, Suction available and Patient being monitored Patient Re-evaluated:Patient Re-evaluated prior to inductionOxygen Delivery Method: Circle System Utilized Preoxygenation: Pre-oxygenation with 100% oxygen Intubation Type: IV induction Ventilation: Two handed mask ventilation required Laryngoscope Size: Miller and 2 Grade View: Grade I Tube type: Oral Tube size: 7.5 mm Number of attempts: 1 Airway Equipment and Method: Stylet and Oral airway Placement Confirmation: ETT inserted through vocal cords under direct vision,  positive ETCO2 and breath sounds checked- equal and bilateral Secured at: 23 cm Tube secured with: Tape Dental Injury: Teeth and Oropharynx as per pre-operative assessment  Comments: Pt with full beard. Succinylcholine used.

## 2015-08-01 NOTE — Anesthesia Postprocedure Evaluation (Signed)
Anesthesia Post Note  Patient: Wayne Pratt  Procedure(s) Performed: Procedure(s) (LRB): Spinal Cord Stimulator Implant (N/A)  Patient location during evaluation: PACU Anesthesia Type: General Level of consciousness: awake Pain management: pain level controlled Vital Signs Assessment: post-procedure vital signs reviewed and stable Respiratory status: spontaneous breathing Cardiovascular status: stable Anesthetic complications: no    Last Vitals:  Filed Vitals:   08/01/15 1426 08/01/15 1444  BP:  142/82  Pulse: 62 74  Temp: 36.5 C 36.8 C  Resp: 13 20    Last Pain:  Filed Vitals:   08/01/15 1451  PainSc: Asleep                 EDWARDS,Alonza Knisley

## 2015-08-01 NOTE — Progress Notes (Signed)
Patient ID: Wayne Pratt, male   DOB: 1960-12-03, 55 y.o.   MRN: [id number] Doing well, sore in back, legs good, MAE

## 2015-08-01 NOTE — Anesthesia Preprocedure Evaluation (Addendum)
Anesthesia Evaluation  Patient identified by MRN, date of birth, ID band Patient awake  General Assessment Comment:Case discussed with Dr. Ronnald Ramp who wished to proceed. CE  Reviewed: Allergy & Precautions, NPO status   Airway Mallampati: II  TM Distance: >3 FB Neck ROM: Full    Dental  (+) Edentulous Upper, Edentulous Lower   Pulmonary sleep apnea , pneumonia, Current Smoker,    breath sounds clear to auscultation       Cardiovascular negative cardio ROS   Rhythm:Regular Rate:Normal     Neuro/Psych    GI/Hepatic negative GI ROS, Neg liver ROS,   Endo/Other  diabetes  Renal/GU Renal disease     Musculoskeletal   Abdominal   Peds  Hematology   Anesthesia Other Findings   Reproductive/Obstetrics                           Anesthesia Physical Anesthesia Plan  ASA: III  Anesthesia Plan: General   Post-op Pain Management:    Induction: Intravenous  Airway Management Planned: Oral ETT  Additional Equipment:   Intra-op Plan:   Post-operative Plan: Extubation in OR  Informed Consent: I have reviewed the patients History and Physical, chart, labs and discussed the procedure including the risks, benefits and alternatives for the proposed anesthesia with the patient or authorized representative who has indicated his/her understanding and acceptance.   Dental advisory given  Plan Discussed with: CRNA and Anesthesiologist  Anesthesia Plan Comments:         Anesthesia Quick Evaluation

## 2015-08-01 NOTE — Progress Notes (Signed)
Spoke with Dr. Oletta Lamas about blood sugar at 262.  Will recheck before patient goes up for surgery... If >300 call Dr Oletta Lamas back.  If not they will take care if it when he gets up to Neuro surgery

## 2015-08-01 NOTE — Transfer of Care (Signed)
Immediate Anesthesia Transfer of Care Note  Patient: Wayne Pratt  Procedure(s) Performed: Procedure(s): Spinal Cord Stimulator Implant (N/A)  Patient Location: PACU  Anesthesia Type:General  Level of Consciousness: awake, alert  and oriented  Airway & Oxygen Therapy: Patient Spontanous Breathing and Patient connected to nasal cannula oxygen  Post-op Assessment: Report given to RN, Post -op Vital signs reviewed and stable and Patient moving all extremities X 4  Post vital signs: Reviewed and stable  Last Vitals:  Filed Vitals:   08/01/15 0942  BP: 140/86  Pulse: 96  Temp: 36.9 C  Resp: 18    Last Pain:  Filed Vitals:   08/01/15 0945  PainSc: 9       Patients Stated Pain Goal: 6 ([id number])  Complications: No apparent anesthesia complications

## 2015-08-01 NOTE — H&P (Signed)
Subjective: Patient is a 55 y.o. male admitted for spinal cord stability replacement. Onset of symptoms was Many months ago, gradually worsening since that time.  The pain is rated severe, and is located at the across the lower back and radiates to legs. The pain is described as burning and occurs all day. The symptoms have been progressive. Symptoms are exacerbated by exercise.  Has a history of peripheral neuropathy.  Past Medical History  Diagnosis Date  . Hypercholesterolemia     lost weight and been off meds over a yr  . Neuropathy ([place])   . Sleep apnea     Stop Bang score of 7  . Kidney stones     22 times  . Anxiety     takes Xanax daily  . Insomnia     takes Trazodone nightly  . Diabetes mellitus ([place])     takes Metformin daily  . Anemia     takes Iron pill daily  . History of hypertension     lost weight and been off meds over a yr  . Pneumonia     hx of-15+ yrs ago  . History of bronchitis     mid 90's  . Weakness     numbness and tingling  . Arthritis   . History of pleural empyema   . History of kidney stones     Past Surgical History  Procedure Laterality Date  . Elbow surgery Right   . Tonsillectomy    . Cervical disc surgery    . Lung surgery  2010    for empyema  . Flexible sigmoidoscopy  10/05/2011    Dr. Fields:poor prep  . Biopsy  10/05/2011    Dr. Oneida Alar :non-erosive gastritis  . Esophagogastroduodenoscopy  Sept 2013    Dr. Oneida Alar: chronic inactive gastritis   . Vasectomy Bilateral 1995  . Esophagogastroduodenoscopy (egd) with propofol N/A 08/06/2014    Procedure: ESOPHAGOGASTRODUODENOSCOPY (EGD) WITH PROPOFOL;  Surgeon: Danie Binder, MD;  Location: [facility];  Service: Endoscopy;  Laterality: N/A;  . Biopsy  08/06/2014    Procedure: BIOPSY;  Surgeon: Danie Binder, MD;  Location: [facility];  Service: Endoscopy;;    Prior to Admission medications   Medication Sig Start Date End Date Taking? Authorizing Provider  acetaminophen (TYLENOL) 325 MG tablet  Take 650 mg by mouth every 6 (six) hours as needed for mild pain.   Yes Historical Provider, MD  ALPRAZolam Duanne Moron) 1 MG tablet Take 1 mg by mouth every 8 (eight) hours.    Yes Historical Provider, MD  aspirin 325 MG tablet Take 325 mg by mouth every 6 (six) hours as needed for mild pain.   Yes Historical Provider, MD  fentaNYL (DURAGESIC - DOSED MCG/HR) 100 MCG/HR Place 1 patch onto the skin every other day.    Yes Historical Provider, MD  Ferrous Sulfate (IRON) 142 (45 Fe) MG TBCR Take 1 tablet by mouth daily.   Yes Historical Provider, MD  IRON PO Take 1 tablet by mouth daily.   Yes Historical Provider, MD  metFORMIN (GLUCOPHAGE-XR) 500 MG 24 hr tablet Take 500 mg by mouth daily with breakfast.   Yes Historical Provider, MD  Oxycodone HCl 10 MG TABS Take 10 mg by mouth every 4 (four) hours.    Yes Historical Provider, MD  traZODone (DESYREL) 25 mg TABS tablet Take 25 mg by mouth at bedtime.   Yes Historical Provider, MD   Allergies  Allergen Reactions  . Penicillins Anaphylaxis  . Ativan [  Lorazepam] Other (See Comments)    Violent and Mean     Social History  Substance Use Topics  . Smoking status: Current Every Day Smoker -- 1.00 packs/day for 39 years    Types: Cigarettes  . Smokeless tobacco: Not on file  . Alcohol Use: No    Family History  Problem Relation Age of Onset  . Colon cancer Neg Hx   . Prostate cancer Other     all male members on mother's side     Review of Systems  Positive ROS: Negative  All other systems have been reviewed and were otherwise negative with the exception of those mentioned in the HPI and as above.  Objective: Vital signs in last 24 hours: Temp:  [98.5 F (36.9 C)] 98.5 F (36.9 C) (07/21 0942) Pulse Rate:  [96] 96 (07/21 0942) Resp:  [18] 18 (07/21 0942) BP: (140)/(86) 140/86 mmHg (07/21 0942) SpO2:  [97 %] 97 % (07/21 0942) Weight:  [101.606 kg (224 lb)] 101.606 kg (224 lb) (07/21 0942)  General Appearance: Alert, cooperative, no  distress, appears stated age Head: Normocephalic, without obvious abnormality, atraumatic Eyes: PERRL, conjunctiva/corneas clear, EOM's intact    Neck: Supple, symmetrical, trachea midline Back: Symmetric, no curvature, ROM normal, no CVA tenderness Lungs:  respirations unlabored Heart: Regular rate and rhythm Abdomen: Soft, non-tender Extremities: Extremities normal, atraumatic, no cyanosis or edema Pulses: 2+ and symmetric all extremities Skin: Skin color, texture, turgor normal, no rashes or lesions  NEUROLOGIC:   Mental status: Alert and oriented x4,  no aphasia, good attention span, fund of knowledge, and memory Motor Exam - grossly normal Sensory Exam - grossly normal Reflexes: Absent Coordination - grossly normal Gait - grossly normal Balance - grossly normal Cranial Nerves: I: smell Not tested  II: visual acuity  OS: nl    OD: nl  II: visual fields Full to confrontation  II: pupils Equal, round, reactive to light  III,VII: ptosis None  III,IV,VI: extraocular muscles  Full ROM  V: mastication Normal  V: facial light touch sensation  Normal  V,VII: corneal reflex  Present  VII: facial muscle function - upper  Normal  VII: facial muscle function - lower Normal  VIII: hearing Not tested  IX: soft palate elevation  Normal  IX,X: gag reflex Present  XI: trapezius strength  5/5  XI: sternocleidomastoid strength 5/5  XI: neck flexion strength  5/5  XII: tongue strength  Normal    Data Review Lab Results  Component Value Date   WBC 7.9 07/30/2015   HGB 15.0 07/30/2015   HCT 44.5 07/30/2015   MCV 88.6 07/30/2015   PLT 179 07/30/2015   Lab Results  Component Value Date   NA 137 07/30/2015   K 4.6 07/30/2015   CL 103 07/30/2015   CO2 27 07/30/2015   BUN 14 07/30/2015   CREATININE 0.99 07/30/2015   GLUCOSE 288* 07/30/2015   Lab Results  Component Value Date   INR 1.01 07/30/2015    Assessment/Plan: Patient admitted for Permanent spinal cord cement on  replacement. Patient has failed a reasonable attempt at conservative therapy.  I explained the condition and procedure to the patient and answered any questions.  Patient wishes to proceed with procedure as planned. Understands risks/ benefits and typical outcomes of procedure.   Loyed Wilmes S 08/01/2015 10:45 AM

## 2015-08-01 NOTE — Op Note (Signed)
08/01/2015  1:17 PM  PATIENT:  Wayne Pratt  55 y.o. male  PRE-OPERATIVE DIAGNOSIS:  Diabetic peripheral neuropathy with chronic neuropathic pain  POST-OPERATIVE DIAGNOSIS:  Same  PROCEDURE:  T9 thoracic laminectomy for placement of permanent spinal cord stimulator paddle, with placement of spinal cord stimulator battery in the left flank  SURGEON:  Sherley Bounds, MD  ASSISTANTS: None  ANESTHESIA:   General  EBL: 50 ml  Total I/O In: 800 [I.V.:800] Out: 50 [Blood:50]  BLOOD ADMINISTERED:none  DRAINS: None   SPECIMEN:  No Specimen  INDICATION FOR PROCEDURE: This patient has diabetic peripheral neuropathy with chronic debilitating neuropathic pain in his legs. He responded well to spinal question a trial. He presents today for permanent spinal question of a replacement. Patient understood the risks, benefits, and alternatives and potential outcomes and wished to proceed.  PROCEDURE DETAILS: The patient was taken to the operating room and after induction of generalized endotracheal anesthesia he was rolled into the prone position on the Wilson frame and all pressure points were padded. His thoracic and lumbar region was cleaned and shaved and then prepped in the usual sterile fashion. Our thoracic incision was then a 5 utilizing [facility] fluoroscopy. The thoracic incision was made and carried down to the thoracic fascia. The fascia was opened and the paraspinous musculature was taken down in a simple periosteal fashion to expose T9-10. Intraoperative fluoroscopy to from a level and then the inferior half of the spinous process was removed and a laminectomy was performed with the high-speed drill and Kerrison punches. The underlying yellow ligament was opened and removed. An incision in the left flank and created a pocket for the battery. Once the laminectomy was completed a pased the dissector to T8 and then passed the permanent paddle to T8 utilizing [facility] fluoroscopy. I then used a shunt passer to  pass the 4 leads to the clinic for the battery. The leads were attached to the battery and the impedance was checked. The leads were then locked into the battery. I sewed the leads to the fascia in the thoracic region. The battery was placed in the pocket. Impedance has remained good. I then irrigated with saline solution. I lined the dura with Gelfoam. I placed vancomycin powder into the wounds. I then closed the fascia with 0 Vicryl. This exchange tissues were closed with 2-0 Vicryl. The subcuticular tissues were closed with 3-0 Vicryl. The skin was closed with Dermabond followed by benzoin and Steri-Strips. Sterile dressings were applied. The patient was awakened from general anesthesia and transported to the recovery room stable condition. At the procedure all sponge and needle and instrument counts were correct.   PLAN OF CARE: Admit for overnight observation  PATIENT DISPOSITION:  PACU - hemodynamically stable.   Delay start of Pharmacological VTE agent (>24hrs) due to surgical blood loss or risk of bleeding:  yes

## 2015-08-01 NOTE — Progress Notes (Signed)
Inpatient Diabetes Program Recommendations  AACE/ADA: New Consensus Statement on Inpatient Glycemic Control (2015)  Target Ranges:  Prepandial:   less than 140 mg/dL      Peak postprandial:   less than 180 mg/dL (1-2 hours)      Critically ill patients:  140 - 180 mg/dL   Lab Results  Component Value Date   GLUCAP 289* 08/01/2015   HGBA1C 11.4* 07/30/2015    Review of Glycemic Control In OR for Permanent spinal cord stimulator implant Diabetes history: DM2 Outpatient Diabetes medications: metformin 500 mg with breakfast Current orders for Inpatient glycemic control: None  HgbA1C of 11.4% indicates very poor glycemic control at home. Will likely benefit from addition of insulin, both inpatient and for home. Will need to f/u with PCP who manages his diabetes. [facility]  Inpatient Diabetes Program Recommendations:    Consider Lantus 15 units QHS Novolog moderate Q4H and then tidwc and hs when po intake increases Will order Living Well With Diabetes book and videos for pt education Consider discharging pt on basal insulin with OHAs. If so, will order Insulin Starter Kit and RN to begin teaching insulin administration.  Will follow. Thank you. Lorenda Peck, RD, LDN, CDE Inpatient Diabetes Coordinator [phone]

## 2015-08-02 DIAGNOSIS — G8929 Other chronic pain: Secondary | ICD-10-CM | POA: Diagnosis not present

## 2015-08-02 LAB — GLUCOSE, CAPILLARY: Glucose-Capillary: 233 mg/dL — ABNORMAL HIGH (ref 65–99)

## 2015-08-02 NOTE — Progress Notes (Signed)
Discharged instructions/education given to patient with wife at bedside and they both verbalized understanding. Pain is moderate, no redness, no swelling no drainage noted. Patient voiding well. Patient ambulating independently with no difficulty. Patient discharged via wheelchair.

## 2015-08-02 NOTE — Discharge Summary (Signed)
Date of Admission: 08/01/2015  Date of Discharge: 08/02/2015  PRE-OPERATIVE DIAGNOSIS: Diabetic peripheral neuropathy with chronic neuropathic pain  POST-OPERATIVE DIAGNOSIS: Same  PROCEDURE: T9 thoracic laminectomy for placement of permanent spinal cord stimulator paddle, with placement of spinal cord stimulator battery in the left flank  Attending: Eustace Moore, MD  Hospital Course:  The patient was admitted for the above listed operation and had an uncomplicated post-operative course.  They were discharged in stable condition.  Follow up: 3 weeks    Medication List    TAKE these medications        acetaminophen 325 MG tablet  Commonly known as:  TYLENOL  Take 650 mg by mouth every 6 (six) hours as needed for mild pain.     ALPRAZolam 1 MG tablet  Commonly known as:  XANAX  Take 1 mg by mouth every 8 (eight) hours.     aspirin 325 MG tablet  Take 325 mg by mouth every 6 (six) hours as needed for mild pain.     fentaNYL 100 MCG/HR  Commonly known as:  DURAGESIC - dosed mcg/hr  Place 1 patch onto the skin every other day.     Iron 142 (45 Fe) MG Tbcr  Take 1 tablet by mouth daily.     IRON PO  Take 1 tablet by mouth daily.     metFORMIN 500 MG 24 hr tablet  Commonly known as:  GLUCOPHAGE-XR  Take 500 mg by mouth daily with breakfast.     Oxycodone HCl 10 MG Tabs  Take 10 mg by mouth every 4 (four) hours.     traZODone 25 mg Tabs tablet  Commonly known as:  DESYREL  Take 25 mg by mouth at bedtime.

## 2015-08-04 ENCOUNTER — Encounter (HOSPITAL_COMMUNITY): Payer: Self-pay | Admitting: Neurological Surgery

## 2015-09-18 DIAGNOSIS — F419 Anxiety disorder, unspecified: Secondary | ICD-10-CM | POA: Diagnosis not present

## 2015-09-18 DIAGNOSIS — R011 Cardiac murmur, unspecified: Secondary | ICD-10-CM | POA: Diagnosis not present

## 2015-09-18 DIAGNOSIS — M549 Dorsalgia, unspecified: Secondary | ICD-10-CM | POA: Diagnosis not present

## 2015-09-18 DIAGNOSIS — H43399 Other vitreous opacities, unspecified eye: Secondary | ICD-10-CM | POA: Diagnosis not present

## 2015-09-18 DIAGNOSIS — E1140 Type 2 diabetes mellitus with diabetic neuropathy, unspecified: Secondary | ICD-10-CM | POA: Diagnosis not present

## 2015-09-18 DIAGNOSIS — R252 Cramp and spasm: Secondary | ICD-10-CM | POA: Diagnosis not present

## 2015-09-19 DIAGNOSIS — F411 Generalized anxiety disorder: Secondary | ICD-10-CM | POA: Diagnosis not present

## 2015-09-19 DIAGNOSIS — G894 Chronic pain syndrome: Secondary | ICD-10-CM | POA: Diagnosis not present

## 2015-09-19 DIAGNOSIS — E1142 Type 2 diabetes mellitus with diabetic polyneuropathy: Secondary | ICD-10-CM | POA: Diagnosis not present

## 2015-09-19 DIAGNOSIS — Z79891 Long term (current) use of opiate analgesic: Secondary | ICD-10-CM | POA: Diagnosis not present

## 2015-09-26 DIAGNOSIS — M549 Dorsalgia, unspecified: Secondary | ICD-10-CM | POA: Diagnosis not present

## 2015-09-26 DIAGNOSIS — F419 Anxiety disorder, unspecified: Secondary | ICD-10-CM | POA: Diagnosis not present

## 2015-09-26 DIAGNOSIS — E1140 Type 2 diabetes mellitus with diabetic neuropathy, unspecified: Secondary | ICD-10-CM | POA: Diagnosis not present

## 2015-09-26 DIAGNOSIS — H43399 Other vitreous opacities, unspecified eye: Secondary | ICD-10-CM | POA: Diagnosis not present

## 2015-09-26 DIAGNOSIS — R252 Cramp and spasm: Secondary | ICD-10-CM | POA: Diagnosis not present

## 2015-09-26 DIAGNOSIS — R011 Cardiac murmur, unspecified: Secondary | ICD-10-CM | POA: Diagnosis not present

## 2015-10-08 DIAGNOSIS — E785 Hyperlipidemia, unspecified: Secondary | ICD-10-CM | POA: Diagnosis not present

## 2015-10-08 DIAGNOSIS — E1140 Type 2 diabetes mellitus with diabetic neuropathy, unspecified: Secondary | ICD-10-CM | POA: Diagnosis not present

## 2015-10-08 DIAGNOSIS — F419 Anxiety disorder, unspecified: Secondary | ICD-10-CM | POA: Diagnosis not present

## 2015-10-17 DIAGNOSIS — E1142 Type 2 diabetes mellitus with diabetic polyneuropathy: Secondary | ICD-10-CM | POA: Diagnosis not present

## 2015-10-17 DIAGNOSIS — Z79891 Long term (current) use of opiate analgesic: Secondary | ICD-10-CM | POA: Diagnosis not present

## 2015-10-17 DIAGNOSIS — G894 Chronic pain syndrome: Secondary | ICD-10-CM | POA: Diagnosis not present

## 2015-10-17 DIAGNOSIS — F411 Generalized anxiety disorder: Secondary | ICD-10-CM | POA: Diagnosis not present

## 2015-11-13 DIAGNOSIS — Z79891 Long term (current) use of opiate analgesic: Secondary | ICD-10-CM | POA: Diagnosis not present

## 2015-11-13 DIAGNOSIS — F411 Generalized anxiety disorder: Secondary | ICD-10-CM | POA: Diagnosis not present

## 2015-11-13 DIAGNOSIS — G894 Chronic pain syndrome: Secondary | ICD-10-CM | POA: Diagnosis not present

## 2015-11-13 DIAGNOSIS — E1142 Type 2 diabetes mellitus with diabetic polyneuropathy: Secondary | ICD-10-CM | POA: Diagnosis not present

## 2016-01-13 DIAGNOSIS — F411 Generalized anxiety disorder: Secondary | ICD-10-CM | POA: Diagnosis not present

## 2016-01-13 DIAGNOSIS — G894 Chronic pain syndrome: Secondary | ICD-10-CM | POA: Diagnosis not present

## 2016-01-13 DIAGNOSIS — E1142 Type 2 diabetes mellitus with diabetic polyneuropathy: Secondary | ICD-10-CM | POA: Diagnosis not present

## 2016-01-13 DIAGNOSIS — Z79891 Long term (current) use of opiate analgesic: Secondary | ICD-10-CM | POA: Diagnosis not present

## 2016-01-23 DIAGNOSIS — E1140 Type 2 diabetes mellitus with diabetic neuropathy, unspecified: Secondary | ICD-10-CM | POA: Diagnosis not present

## 2016-01-23 DIAGNOSIS — E785 Hyperlipidemia, unspecified: Secondary | ICD-10-CM | POA: Diagnosis not present

## 2016-01-27 DIAGNOSIS — E785 Hyperlipidemia, unspecified: Secondary | ICD-10-CM | POA: Diagnosis not present

## 2016-01-27 DIAGNOSIS — R05 Cough: Secondary | ICD-10-CM | POA: Diagnosis not present

## 2016-01-27 DIAGNOSIS — E119 Type 2 diabetes mellitus without complications: Secondary | ICD-10-CM | POA: Diagnosis not present

## 2016-03-10 DIAGNOSIS — F411 Generalized anxiety disorder: Secondary | ICD-10-CM | POA: Diagnosis not present

## 2016-03-10 DIAGNOSIS — G894 Chronic pain syndrome: Secondary | ICD-10-CM | POA: Diagnosis not present

## 2016-03-10 DIAGNOSIS — Z79891 Long term (current) use of opiate analgesic: Secondary | ICD-10-CM | POA: Diagnosis not present

## 2016-03-10 DIAGNOSIS — E1142 Type 2 diabetes mellitus with diabetic polyneuropathy: Secondary | ICD-10-CM | POA: Diagnosis not present

## 2016-05-05 DIAGNOSIS — F411 Generalized anxiety disorder: Secondary | ICD-10-CM | POA: Diagnosis not present

## 2016-05-05 DIAGNOSIS — E1142 Type 2 diabetes mellitus with diabetic polyneuropathy: Secondary | ICD-10-CM | POA: Diagnosis not present

## 2016-05-05 DIAGNOSIS — Z79891 Long term (current) use of opiate analgesic: Secondary | ICD-10-CM | POA: Diagnosis not present

## 2016-05-05 DIAGNOSIS — G894 Chronic pain syndrome: Secondary | ICD-10-CM | POA: Diagnosis not present

## 2016-05-18 DIAGNOSIS — E119 Type 2 diabetes mellitus without complications: Secondary | ICD-10-CM | POA: Diagnosis not present

## 2016-05-18 DIAGNOSIS — E785 Hyperlipidemia, unspecified: Secondary | ICD-10-CM | POA: Diagnosis not present

## 2016-05-20 DIAGNOSIS — E119 Type 2 diabetes mellitus without complications: Secondary | ICD-10-CM | POA: Diagnosis not present

## 2016-05-20 DIAGNOSIS — E785 Hyperlipidemia, unspecified: Secondary | ICD-10-CM | POA: Diagnosis not present

## 2016-05-20 DIAGNOSIS — R202 Paresthesia of skin: Secondary | ICD-10-CM | POA: Diagnosis not present

## 2016-05-20 DIAGNOSIS — Z0001 Encounter for general adult medical examination with abnormal findings: Secondary | ICD-10-CM | POA: Diagnosis not present

## 2016-05-20 DIAGNOSIS — R52 Pain, unspecified: Secondary | ICD-10-CM | POA: Diagnosis not present

## 2016-06-25 DIAGNOSIS — E1142 Type 2 diabetes mellitus with diabetic polyneuropathy: Secondary | ICD-10-CM | POA: Diagnosis not present

## 2016-06-25 DIAGNOSIS — Z79891 Long term (current) use of opiate analgesic: Secondary | ICD-10-CM | POA: Diagnosis not present

## 2016-06-25 DIAGNOSIS — G894 Chronic pain syndrome: Secondary | ICD-10-CM | POA: Diagnosis not present

## 2016-06-25 DIAGNOSIS — F411 Generalized anxiety disorder: Secondary | ICD-10-CM | POA: Diagnosis not present

## 2016-09-03 DIAGNOSIS — Z79891 Long term (current) use of opiate analgesic: Secondary | ICD-10-CM | POA: Diagnosis not present

## 2016-09-03 DIAGNOSIS — E1142 Type 2 diabetes mellitus with diabetic polyneuropathy: Secondary | ICD-10-CM | POA: Diagnosis not present

## 2016-09-03 DIAGNOSIS — F411 Generalized anxiety disorder: Secondary | ICD-10-CM | POA: Diagnosis not present

## 2016-09-03 DIAGNOSIS — G894 Chronic pain syndrome: Secondary | ICD-10-CM | POA: Diagnosis not present

## 2016-10-27 DIAGNOSIS — R52 Pain, unspecified: Secondary | ICD-10-CM | POA: Diagnosis not present

## 2016-10-27 DIAGNOSIS — Z79899 Other long term (current) drug therapy: Secondary | ICD-10-CM | POA: Diagnosis not present

## 2016-10-27 DIAGNOSIS — F419 Anxiety disorder, unspecified: Secondary | ICD-10-CM | POA: Diagnosis not present

## 2016-10-27 DIAGNOSIS — E119 Type 2 diabetes mellitus without complications: Secondary | ICD-10-CM | POA: Diagnosis not present

## 2016-10-27 DIAGNOSIS — Z7984 Long term (current) use of oral hypoglycemic drugs: Secondary | ICD-10-CM | POA: Diagnosis not present

## 2016-10-27 DIAGNOSIS — E785 Hyperlipidemia, unspecified: Secondary | ICD-10-CM | POA: Diagnosis not present

## 2016-10-27 DIAGNOSIS — Z23 Encounter for immunization: Secondary | ICD-10-CM | POA: Diagnosis not present

## 2016-11-01 DIAGNOSIS — M62830 Muscle spasm of back: Secondary | ICD-10-CM | POA: Diagnosis not present

## 2016-11-01 DIAGNOSIS — E1142 Type 2 diabetes mellitus with diabetic polyneuropathy: Secondary | ICD-10-CM | POA: Diagnosis not present

## 2016-11-01 DIAGNOSIS — Z79891 Long term (current) use of opiate analgesic: Secondary | ICD-10-CM | POA: Diagnosis not present

## 2016-11-01 DIAGNOSIS — G894 Chronic pain syndrome: Secondary | ICD-10-CM | POA: Diagnosis not present

## 2016-11-12 ENCOUNTER — Ambulatory Visit (HOSPITAL_COMMUNITY)
Admission: RE | Admit: 2016-11-12 | Discharge: 2016-11-12 | Disposition: A | Discharge status: Home / Self Care | Payer: PPO | Source: Ambulatory Visit | Attending: Family Medicine | Admitting: Family Medicine

## 2016-11-12 ENCOUNTER — Other Ambulatory Visit (HOSPITAL_COMMUNITY): Payer: Self-pay | Admitting: Family Medicine

## 2016-11-12 DIAGNOSIS — M25511 Pain in right shoulder: Secondary | ICD-10-CM | POA: Insufficient documentation

## 2016-11-18 DIAGNOSIS — E119 Type 2 diabetes mellitus without complications: Secondary | ICD-10-CM | POA: Diagnosis not present

## 2016-11-18 DIAGNOSIS — E785 Hyperlipidemia, unspecified: Secondary | ICD-10-CM | POA: Diagnosis not present

## 2016-11-18 DIAGNOSIS — Z0001 Encounter for general adult medical examination with abnormal findings: Secondary | ICD-10-CM | POA: Diagnosis not present

## 2016-11-22 DIAGNOSIS — Z79899 Other long term (current) drug therapy: Secondary | ICD-10-CM | POA: Diagnosis not present

## 2016-11-22 DIAGNOSIS — M25511 Pain in right shoulder: Secondary | ICD-10-CM | POA: Diagnosis not present

## 2016-11-22 DIAGNOSIS — Z7984 Long term (current) use of oral hypoglycemic drugs: Secondary | ICD-10-CM | POA: Diagnosis not present

## 2016-11-22 DIAGNOSIS — G8929 Other chronic pain: Secondary | ICD-10-CM | POA: Diagnosis not present

## 2016-11-22 DIAGNOSIS — F419 Anxiety disorder, unspecified: Secondary | ICD-10-CM | POA: Diagnosis not present

## 2016-11-22 DIAGNOSIS — E785 Hyperlipidemia, unspecified: Secondary | ICD-10-CM | POA: Diagnosis not present

## 2016-11-22 DIAGNOSIS — E119 Type 2 diabetes mellitus without complications: Secondary | ICD-10-CM | POA: Diagnosis not present

## 2016-11-29 ENCOUNTER — Other Ambulatory Visit: Payer: Self-pay | Admitting: Orthopedic Surgery

## 2016-11-29 DIAGNOSIS — M25511 Pain in right shoulder: Secondary | ICD-10-CM | POA: Diagnosis not present

## 2016-12-15 ENCOUNTER — Ambulatory Visit
Admission: RE | Admit: 2016-12-15 | Discharge: 2016-12-15 | Disposition: A | Discharge status: Home / Self Care | Payer: PPO | Source: Ambulatory Visit | Attending: Orthopedic Surgery | Admitting: Orthopedic Surgery

## 2016-12-15 DIAGNOSIS — M25511 Pain in right shoulder: Secondary | ICD-10-CM

## 2016-12-15 DIAGNOSIS — M75111 Incomplete rotator cuff tear or rupture of right shoulder, not specified as traumatic: Secondary | ICD-10-CM | POA: Diagnosis not present

## 2016-12-15 MED ORDER — IOPAMIDOL (ISOVUE-M 200) INJECTION 41%
12.0000 mL | Freq: Once | INTRAMUSCULAR | Status: AC
  Administered 2016-12-15: 12 mL via INTRA_ARTICULAR

## 2016-12-15 MED ORDER — METHYLPREDNISOLONE ACETATE 40 MG/ML INJ SUSP (RADIOLOG
120.0000 mg | Freq: Once | INTRAMUSCULAR | Status: AC
  Administered 2016-12-15: 120 mg via INTRA_ARTICULAR

## 2016-12-22 DIAGNOSIS — M25511 Pain in right shoulder: Secondary | ICD-10-CM | POA: Diagnosis not present

## 2016-12-27 DIAGNOSIS — E1142 Type 2 diabetes mellitus with diabetic polyneuropathy: Secondary | ICD-10-CM | POA: Diagnosis not present

## 2016-12-27 DIAGNOSIS — G894 Chronic pain syndrome: Secondary | ICD-10-CM | POA: Diagnosis not present

## 2016-12-27 DIAGNOSIS — M62830 Muscle spasm of back: Secondary | ICD-10-CM | POA: Diagnosis not present

## 2016-12-27 DIAGNOSIS — Z79891 Long term (current) use of opiate analgesic: Secondary | ICD-10-CM | POA: Diagnosis not present

## 2016-12-31 DIAGNOSIS — Z7984 Long term (current) use of oral hypoglycemic drugs: Secondary | ICD-10-CM | POA: Diagnosis not present

## 2016-12-31 DIAGNOSIS — Z0001 Encounter for general adult medical examination with abnormal findings: Secondary | ICD-10-CM | POA: Diagnosis not present

## 2016-12-31 DIAGNOSIS — Z79899 Other long term (current) drug therapy: Secondary | ICD-10-CM | POA: Diagnosis not present

## 2016-12-31 DIAGNOSIS — E119 Type 2 diabetes mellitus without complications: Secondary | ICD-10-CM | POA: Diagnosis not present

## 2016-12-31 DIAGNOSIS — F419 Anxiety disorder, unspecified: Secondary | ICD-10-CM | POA: Diagnosis not present

## 2016-12-31 DIAGNOSIS — R202 Paresthesia of skin: Secondary | ICD-10-CM | POA: Diagnosis not present

## 2016-12-31 DIAGNOSIS — Z79891 Long term (current) use of opiate analgesic: Secondary | ICD-10-CM | POA: Diagnosis not present

## 2016-12-31 DIAGNOSIS — M25511 Pain in right shoulder: Secondary | ICD-10-CM | POA: Diagnosis not present

## 2016-12-31 DIAGNOSIS — G8929 Other chronic pain: Secondary | ICD-10-CM | POA: Diagnosis not present

## 2016-12-31 DIAGNOSIS — E785 Hyperlipidemia, unspecified: Secondary | ICD-10-CM | POA: Diagnosis not present

## 2016-12-31 DIAGNOSIS — Z713 Dietary counseling and surveillance: Secondary | ICD-10-CM | POA: Diagnosis not present

## 2017-02-02 DIAGNOSIS — F419 Anxiety disorder, unspecified: Secondary | ICD-10-CM | POA: Diagnosis not present

## 2017-02-02 DIAGNOSIS — R202 Paresthesia of skin: Secondary | ICD-10-CM | POA: Diagnosis not present

## 2017-02-02 DIAGNOSIS — M545 Low back pain: Secondary | ICD-10-CM | POA: Diagnosis not present

## 2017-02-02 DIAGNOSIS — M25511 Pain in right shoulder: Secondary | ICD-10-CM | POA: Diagnosis not present

## 2017-02-02 DIAGNOSIS — Z79899 Other long term (current) drug therapy: Secondary | ICD-10-CM | POA: Diagnosis not present

## 2017-02-02 DIAGNOSIS — Z79891 Long term (current) use of opiate analgesic: Secondary | ICD-10-CM | POA: Diagnosis not present

## 2017-03-04 DIAGNOSIS — G894 Chronic pain syndrome: Secondary | ICD-10-CM | POA: Diagnosis not present

## 2017-03-04 DIAGNOSIS — E1142 Type 2 diabetes mellitus with diabetic polyneuropathy: Secondary | ICD-10-CM | POA: Diagnosis not present

## 2017-03-04 DIAGNOSIS — Z79891 Long term (current) use of opiate analgesic: Secondary | ICD-10-CM | POA: Diagnosis not present

## 2017-03-04 DIAGNOSIS — M62830 Muscle spasm of back: Secondary | ICD-10-CM | POA: Diagnosis not present

## 2017-04-05 DIAGNOSIS — M25511 Pain in right shoulder: Secondary | ICD-10-CM | POA: Diagnosis not present

## 2017-04-05 DIAGNOSIS — F419 Anxiety disorder, unspecified: Secondary | ICD-10-CM | POA: Diagnosis not present

## 2017-04-05 DIAGNOSIS — R030 Elevated blood-pressure reading, without diagnosis of hypertension: Secondary | ICD-10-CM | POA: Diagnosis not present

## 2017-04-05 DIAGNOSIS — Z79899 Other long term (current) drug therapy: Secondary | ICD-10-CM | POA: Diagnosis not present

## 2017-04-08 DIAGNOSIS — M25511 Pain in right shoulder: Secondary | ICD-10-CM | POA: Diagnosis not present

## 2017-04-16 DIAGNOSIS — Z79899 Other long term (current) drug therapy: Secondary | ICD-10-CM | POA: Diagnosis not present

## 2017-04-16 DIAGNOSIS — E119 Type 2 diabetes mellitus without complications: Secondary | ICD-10-CM | POA: Diagnosis not present

## 2017-04-16 DIAGNOSIS — E785 Hyperlipidemia, unspecified: Secondary | ICD-10-CM | POA: Diagnosis not present

## 2017-04-19 DIAGNOSIS — Z79891 Long term (current) use of opiate analgesic: Secondary | ICD-10-CM | POA: Diagnosis not present

## 2017-04-19 DIAGNOSIS — M25511 Pain in right shoulder: Secondary | ICD-10-CM | POA: Diagnosis not present

## 2017-04-19 DIAGNOSIS — F419 Anxiety disorder, unspecified: Secondary | ICD-10-CM | POA: Diagnosis not present

## 2017-04-19 DIAGNOSIS — Z79899 Other long term (current) drug therapy: Secondary | ICD-10-CM | POA: Diagnosis not present

## 2017-04-19 DIAGNOSIS — Z7984 Long term (current) use of oral hypoglycemic drugs: Secondary | ICD-10-CM | POA: Diagnosis not present

## 2017-04-19 DIAGNOSIS — E1165 Type 2 diabetes mellitus with hyperglycemia: Secondary | ICD-10-CM | POA: Diagnosis not present

## 2017-04-19 DIAGNOSIS — I10 Essential (primary) hypertension: Secondary | ICD-10-CM | POA: Diagnosis not present

## 2017-04-21 ENCOUNTER — Emergency Department (HOSPITAL_COMMUNITY)
Admission: EM | Admit: 2017-04-21 | Discharge: 2017-04-21 | Disposition: A | Discharge status: Home / Self Care | Payer: PPO | Attending: Emergency Medicine | Admitting: Emergency Medicine

## 2017-04-21 ENCOUNTER — Emergency Department (HOSPITAL_COMMUNITY): Payer: PPO

## 2017-04-21 ENCOUNTER — Other Ambulatory Visit: Payer: Self-pay

## 2017-04-21 ENCOUNTER — Encounter (HOSPITAL_COMMUNITY): Payer: Self-pay | Admitting: *Deleted

## 2017-04-21 DIAGNOSIS — Z7984 Long term (current) use of oral hypoglycemic drugs: Secondary | ICD-10-CM | POA: Insufficient documentation

## 2017-04-21 DIAGNOSIS — I10 Essential (primary) hypertension: Secondary | ICD-10-CM | POA: Insufficient documentation

## 2017-04-21 DIAGNOSIS — F419 Anxiety disorder, unspecified: Secondary | ICD-10-CM | POA: Insufficient documentation

## 2017-04-21 DIAGNOSIS — Z79899 Other long term (current) drug therapy: Secondary | ICD-10-CM | POA: Insufficient documentation

## 2017-04-21 DIAGNOSIS — R0789 Other chest pain: Secondary | ICD-10-CM | POA: Insufficient documentation

## 2017-04-21 DIAGNOSIS — E119 Type 2 diabetes mellitus without complications: Secondary | ICD-10-CM | POA: Diagnosis not present

## 2017-04-21 DIAGNOSIS — F17210 Nicotine dependence, cigarettes, uncomplicated: Secondary | ICD-10-CM | POA: Diagnosis not present

## 2017-04-21 DIAGNOSIS — R079 Chest pain, unspecified: Secondary | ICD-10-CM

## 2017-04-21 LAB — TROPONIN I: Troponin I: <0.03 ng/mL (ref <0.03)

## 2017-04-21 LAB — COMPREHENSIVE METABOLIC PANEL
ALK PHOS: 52 U/L (ref 38–126)
ALT: 18 U/L (ref 17–63)
AST: 19 U/L (ref 15–41)
Albumin: 4.1 g/dL (ref 3.5–5.0)
Anion gap: 11 (ref 5–15)
BILIRUBIN TOTAL: 0.4 mg/dL (ref 0.3–1.2)
BUN: 16 mg/dL (ref 6–20)
CALCIUM: 10 mg/dL (ref 8.9–10.3)
CHLORIDE: 101 mmol/L (ref 101–111)
CO2: 24 mmol/L (ref 22–32)
CREATININE: 1.01 mg/dL (ref 0.61–1.24)
GFR calc Af Amer: >60 mL/min (ref >60)
Glucose, Bld: 232 mg/dL — ABNORMAL HIGH (ref 65–99)
Potassium: 4.2 mmol/L (ref 3.5–5.1)
Sodium: 136 mmol/L (ref 135–145)
Total Protein: 6.9 g/dL (ref 6.5–8.1)

## 2017-04-21 LAB — CBC WITH DIFFERENTIAL/PLATELET
BASOS PCT: 0 %
Basophils Absolute: 0 10*3/uL (ref 0.0–0.1)
EOS ABS: 0.1 10*3/uL (ref 0.0–0.7)
Eosinophils Relative: 2 %
HCT: 44.6 % (ref 39.0–52.0)
HEMOGLOBIN: 14.6 g/dL (ref 13.0–17.0)
LYMPHS ABS: 2.2 10*3/uL (ref 0.7–4.0)
Lymphocytes Relative: 26 %
MCH: 30.2 pg (ref 26.0–34.0)
MCHC: 32.7 g/dL (ref 30.0–36.0)
MCV: 92.1 fL (ref 78.0–100.0)
Monocytes Absolute: 0.6 10*3/uL (ref 0.1–1.0)
Monocytes Relative: 7 %
NEUTROS PCT: 65 %
Neutro Abs: 5.5 10*3/uL (ref 1.7–7.7)
Platelets: 238 10*3/uL (ref 150–400)
RBC: 4.84 MIL/uL (ref 4.22–5.81)
RDW: 12.7 % (ref 11.5–15.5)
WBC: 8.5 10*3/uL (ref 4.0–10.5)

## 2017-04-21 LAB — D-DIMER, QUANTITATIVE (NOT AT ARMC): D DIMER QUANT: 0.27 ug{FEU}/mL (ref 0.00–0.50)

## 2017-04-21 MED ORDER — OXYCODONE-ACETAMINOPHEN 5-325 MG PO TABS
2.0000 | ORAL_TABLET | Freq: Once | ORAL | Status: AC
  Administered 2017-04-21: 2 via ORAL
  Filled 2017-04-21: qty 2

## 2017-04-21 MED ORDER — FENTANYL CITRATE (PF) 100 MCG/2ML IJ SOLN
50.0000 ug | Freq: Once | INTRAMUSCULAR | Status: AC
  Administered 2017-04-21: 50 ug via INTRAVENOUS
  Filled 2017-04-21: qty 2

## 2017-04-21 MED ORDER — KETOROLAC TROMETHAMINE 30 MG/ML IJ SOLN
15.0000 mg | Freq: Once | INTRAMUSCULAR | Status: AC
  Administered 2017-04-21: 15 mg via INTRAVENOUS
  Filled 2017-04-21: qty 1

## 2017-04-21 NOTE — ED Provider Notes (Signed)
[facility] EMERGENCY DEPARTMENT Provider Note   CSN: [id number] Arrival date & time: 04/21/17  1155     History   Chief Complaint Chief Complaint  Patient presents with  . Chest Pain    HPI Wayne Pratt is a 57 y.o. male.  HPI Patient presents with sharp chest pain that began acutely around 1 hour prior to arrival in the ER.  States he was right sided getting up from sitting down what happened.  No shortness of breath.  No diaphoresis.  Nothing really changes the pain.  No swelling in his legs.  No fever.  Has not had pains like this before.  States he does have family history of cardiac disease.  Patient had a negative stress test years ago.  No real difficulty breathing.  No recent travel. Past Medical History:  Diagnosis Date  . Anemia    takes Iron pill daily  . Anxiety    takes Xanax daily  . Arthritis   . Diabetes mellitus ([place])    takes Metformin daily  . History of bronchitis    mid 90's  . History of hypertension    lost weight and been off meds over a yr  . History of kidney stones   . History of pleural empyema   . Hypercholesterolemia    lost weight and been off meds over a yr  . Insomnia    takes Trazodone nightly  . Kidney stones    22 times  . Neuropathy   . Pneumonia    hx of-15+ yrs ago  . Sleep apnea    Stop Bang score of 7  . Weakness    numbness and tingling    Patient Active Problem List   Diagnosis Date Noted  . S/P insertion of spinal cord stimulator 08/01/2015  . Rectal bleeding 07/17/2014  . Anemia 07/17/2014  . Fatty liver 09/07/2011  . Diabetes mellitus ([place]) 09/07/2011  . Encounter for screening colonoscopy 09/07/2011  . Chronic pain 09/07/2011    Past Surgical History:  Procedure Laterality Date  . BIOPSY  10/05/2011   Dr. Oneida Alar :non-erosive gastritis  . BIOPSY  08/06/2014   Procedure: BIOPSY;  Surgeon: Danie Binder, MD;  Location: [facility];  Service: Endoscopy;;  . CERVICAL DISC SURGERY    . ELBOW SURGERY Right   .  ESOPHAGOGASTRODUODENOSCOPY  Sept 2013   Dr. Oneida Alar: chronic inactive gastritis   . ESOPHAGOGASTRODUODENOSCOPY (EGD) WITH PROPOFOL N/A 08/06/2014   Procedure: ESOPHAGOGASTRODUODENOSCOPY (EGD) WITH PROPOFOL;  Surgeon: Danie Binder, MD;  Location: [facility];  Service: Endoscopy;  Laterality: N/A;  . FLEXIBLE SIGMOIDOSCOPY  10/05/2011   Dr. Fields:poor prep  . LUNG SURGERY  2010   for empyema  . SPINAL CORD STIMULATOR INSERTION N/A 08/01/2015   Procedure: Spinal Cord Stimulator Implant;  Surgeon: Eustace Moore, MD;  Location: [facility];  Service: Neurosurgery;  Laterality: N/A;  . TONSILLECTOMY    . VASECTOMY Bilateral 1995        Home Medications    Prior to Admission medications   Medication Sig Start Date End Date Taking? Authorizing Provider  acetaminophen (TYLENOL) 325 MG tablet Take 650 mg by mouth every 6 (six) hours as needed for mild pain.   Yes [provider]  ALPRAZolam Duanne Moron) 0.5 MG tablet Take 0.5 mg by mouth 2 (two) times daily as needed for anxiety.   Yes [provider]  cyclobenzaprine (FLEXERIL) 10 MG tablet Take 1 tablet by mouth 3 (three) times daily  as needed for muscle spasms. 04/14/17  Yes [provider]  GLIPIZIDE XL 5 MG 24 hr tablet Take 1 tablet by mouth daily. 04/01/17  Yes [provider]  lisinopril (PRINIVIL,ZESTRIL) 10 MG tablet Take 10 mg by mouth daily.  04/19/17  Yes [provider]  lovastatin (MEVACOR) 40 MG tablet Take 40 mg by mouth at bedtime.  04/01/17  Yes [provider]  meloxicam (MOBIC) 15 MG tablet Take 1 tablet by mouth daily as needed for pain. 01/24/17  Yes [provider]  metFORMIN (GLUCOPHAGE) 1000 MG tablet Take 1,000 mg by mouth 3 (three) times daily.  04/01/17  Yes [provider]  naproxen (NAPROSYN) 500 MG tablet Take 500 mg by mouth 2 (two) times daily with a meal.  02/08/17  Yes [provider]  Oxycodone HCl 10 MG TABS Take 10 mg by mouth every 4 (four)  hours.    Yes [provider]    Family History Family History  Problem Relation Age of Onset  . Prostate cancer Other        all male members on mother's side  . Colon cancer Neg Hx     Social History Social History   Tobacco Use  . Smoking status: Current Every Day Smoker    Packs/day: 1.00    Years: 39.00    Pack years: 39.00    Types: Cigarettes  . Smokeless tobacco: Never Used  Substance Use Topics  . Alcohol use: No    Alcohol/week: 0.0 oz  . Drug use: No     Allergies   Penicillins and Ativan [lorazepam]   Review of Systems Review of Systems  Constitutional: Negative for appetite change and fatigue.  HENT: Negative for congestion.   Respiratory: Negative for cough and shortness of breath.   Cardiovascular: Positive for chest pain.  Gastrointestinal: Negative for abdominal pain.  Genitourinary: Negative for flank pain.  Musculoskeletal: Negative for back pain.  Neurological: Negative for seizures.  Hematological: Negative for adenopathy.  Psychiatric/Behavioral: Negative for confusion.     Physical Exam Updated Vital Signs BP (!) 150/91   Pulse 89   Temp 98 F (36.7 C) (Oral)   Resp 18   Ht 6\' 1"  (1.854 m)   Wt 111.1 kg (245 lb)   SpO2 98%   BMI 32.32 kg/m   Physical Exam  Constitutional: He appears well-developed.  HENT:  Head: Atraumatic.  Neck: Neck supple.  Cardiovascular: Normal rate and normal pulses.  Pulmonary/Chest: Effort normal.  No tenderness to right chest wall.  No rash.  Musculoskeletal:       Right lower leg: He exhibits no edema.       Left lower leg: He exhibits no edema.  Neurological: He is alert.  Skin: Skin is warm. Capillary refill takes less than 2 seconds.  Psychiatric:  Patient appears somewhat anxious.     ED Treatments / Results  Labs (all labs ordered are listed, but only abnormal results are displayed) Labs Reviewed  COMPREHENSIVE METABOLIC PANEL - Abnormal; Notable for the following  components:      Result Value   Glucose, Bld 232 (*)    All other components within normal limits  TROPONIN I  D-DIMER, QUANTITATIVE (NOT AT [facility])  CBC WITH DIFFERENTIAL/PLATELET  TROPONIN I    EKG EKG Interpretation  Date/Time:  Thursday April 21 2017 12:14:12 EDT Ventricular Rate:  98 PR Interval:    QRS Duration: 83 QT Interval:  327 QTC Calculation: 418 R Axis:  36 Text Interpretation:  Sinus rhythm Ventricular premature complex Confirmed by Davonna Belling [phone]) on 04/21/2017 12:19:49 PM   Radiology Dg Chest 2 View  Result Date: 04/21/2017 CLINICAL DATA:  Right-sided chest pain, diabetes, smoking history EXAM: CHEST - 2 VIEW COMPARISON:  Chest x-ray of 07/30/2015 FINDINGS: No active infiltrate or effusion is seen. Mediastinal and hilar contours are unremarkable. The heart is within upper limits of normal. No acute bony abnormality is seen. Neurostimulator electrode leads overlie the lower thoracic spinal canal. Lower anterior cervical spine fusion plate also is noted. IMPRESSION: 1. No active cardiopulmonary disease. 2. Neurostimulator electrode leads overlie the lower thoracic spinal canal. Electronically Signed   By: Ivar Drape M.D.   On: 04/21/2017 13:21    Procedures Procedures (including critical care time)  Medications Ordered in ED Medications  fentaNYL (SUBLIMAZE) injection 50 mcg (50 mcg Intravenous Given 04/21/17 1237)  ketorolac (TORADOL) 30 MG/ML injection 15 mg (15 mg Intravenous Given 04/21/17 1452)  oxyCODONE-acetaminophen (PERCOCET/ROXICET) 5-325 MG per tablet 2 tablet (2 tablets Oral Given 04/21/17 1659)     Initial Impression / Assessment and Plan / ED Course  I have reviewed the triage vital signs and the nursing notes.  Pertinent labs & imaging results that were available during my care of the patient were reviewed by me and considered in my medical decision making (see chart for details).     Patient with chest pain.  Right-sided pressure.   EKG reassuring.  Pain-free now.  Enzymes negative x2.  D-dimer negative.  X-ray reassuring.  Labs reviewed.  Will discharge home to follow-up with PCP as needed.  Final Clinical Impressions(s) / ED Diagnoses   Final diagnoses:  Nonspecific chest pain    ED Discharge Orders    None       Davonna Belling, MD 04/21/17 1747

## 2017-04-21 NOTE — ED Triage Notes (Signed)
Pt c/o right sided chest pain that started 1 hour ago. Denies SOB, n/v, dizziness.

## 2017-04-28 DIAGNOSIS — Z79891 Long term (current) use of opiate analgesic: Secondary | ICD-10-CM | POA: Diagnosis not present

## 2017-04-28 DIAGNOSIS — G894 Chronic pain syndrome: Secondary | ICD-10-CM | POA: Diagnosis not present

## 2017-04-28 DIAGNOSIS — M62830 Muscle spasm of back: Secondary | ICD-10-CM | POA: Diagnosis not present

## 2017-04-28 DIAGNOSIS — E1142 Type 2 diabetes mellitus with diabetic polyneuropathy: Secondary | ICD-10-CM | POA: Diagnosis not present

## 2017-05-09 DIAGNOSIS — M25511 Pain in right shoulder: Secondary | ICD-10-CM | POA: Diagnosis not present

## 2017-05-17 DIAGNOSIS — F419 Anxiety disorder, unspecified: Secondary | ICD-10-CM | POA: Diagnosis not present

## 2017-05-17 DIAGNOSIS — I10 Essential (primary) hypertension: Secondary | ICD-10-CM | POA: Diagnosis not present

## 2017-05-17 DIAGNOSIS — Z79899 Other long term (current) drug therapy: Secondary | ICD-10-CM | POA: Diagnosis not present

## 2017-05-30 ENCOUNTER — Emergency Department (HOSPITAL_COMMUNITY): Payer: PPO

## 2017-05-30 ENCOUNTER — Encounter (HOSPITAL_COMMUNITY): Payer: Self-pay

## 2017-05-30 ENCOUNTER — Emergency Department (HOSPITAL_COMMUNITY)
Admission: EM | Admit: 2017-05-30 | Discharge: 2017-05-30 | Disposition: A | Discharge status: Home / Self Care | Payer: PPO | Attending: Emergency Medicine | Admitting: Emergency Medicine

## 2017-05-30 ENCOUNTER — Other Ambulatory Visit: Payer: Self-pay

## 2017-05-30 DIAGNOSIS — R1013 Epigastric pain: Secondary | ICD-10-CM | POA: Diagnosis not present

## 2017-05-30 DIAGNOSIS — R9431 Abnormal electrocardiogram [ECG] [EKG]: Secondary | ICD-10-CM | POA: Diagnosis not present

## 2017-05-30 DIAGNOSIS — Z79899 Other long term (current) drug therapy: Secondary | ICD-10-CM | POA: Diagnosis not present

## 2017-05-30 DIAGNOSIS — E1140 Type 2 diabetes mellitus with diabetic neuropathy, unspecified: Secondary | ICD-10-CM | POA: Insufficient documentation

## 2017-05-30 DIAGNOSIS — F17210 Nicotine dependence, cigarettes, uncomplicated: Secondary | ICD-10-CM | POA: Diagnosis not present

## 2017-05-30 DIAGNOSIS — R079 Chest pain, unspecified: Secondary | ICD-10-CM | POA: Diagnosis not present

## 2017-05-30 DIAGNOSIS — I10 Essential (primary) hypertension: Secondary | ICD-10-CM | POA: Insufficient documentation

## 2017-05-30 DIAGNOSIS — R109 Unspecified abdominal pain: Secondary | ICD-10-CM | POA: Diagnosis not present

## 2017-05-30 DIAGNOSIS — Z7984 Long term (current) use of oral hypoglycemic drugs: Secondary | ICD-10-CM | POA: Diagnosis not present

## 2017-05-30 LAB — COMPREHENSIVE METABOLIC PANEL
ALK PHOS: 62 U/L (ref 38–126)
ALT: 18 U/L (ref 17–63)
AST: 19 U/L (ref 15–41)
Albumin: 4.7 g/dL (ref 3.5–5.0)
Anion gap: 11 (ref 5–15)
BUN: 14 mg/dL (ref 6–20)
CALCIUM: 9.7 mg/dL (ref 8.9–10.3)
CO2: 26 mmol/L (ref 22–32)
CREATININE: 0.92 mg/dL (ref 0.61–1.24)
Chloride: 99 mmol/L — ABNORMAL LOW (ref 101–111)
Glucose, Bld: 180 mg/dL — ABNORMAL HIGH (ref 65–99)
Potassium: 4.7 mmol/L (ref 3.5–5.1)
Sodium: 136 mmol/L (ref 135–145)
Total Bilirubin: 0.6 mg/dL (ref 0.3–1.2)
Total Protein: 7.9 g/dL (ref 6.5–8.1)

## 2017-05-30 LAB — URINALYSIS, ROUTINE W REFLEX MICROSCOPIC
Bilirubin Urine: NEGATIVE
Glucose, UA: 150 mg/dL — AB
Hgb urine dipstick: NEGATIVE
Ketones, ur: NEGATIVE mg/dL
Leukocytes, UA: NEGATIVE
NITRITE: NEGATIVE
Protein, ur: NEGATIVE mg/dL
Specific Gravity, Urine: 1.015 (ref 1.005–1.030)
pH: 6 (ref 5.0–8.0)

## 2017-05-30 LAB — CBC
HCT: 46.6 % (ref 39.0–52.0)
Hemoglobin: 15.6 g/dL (ref 13.0–17.0)
MCH: 30.5 pg (ref 26.0–34.0)
MCHC: 33.5 g/dL (ref 30.0–36.0)
MCV: 91 fL (ref 78.0–100.0)
Platelets: 256 10*3/uL (ref 150–400)
RBC: 5.12 MIL/uL (ref 4.22–5.81)
RDW: 12.7 % (ref 11.5–15.5)
WBC: 10.7 10*3/uL — ABNORMAL HIGH (ref 4.0–10.5)

## 2017-05-30 LAB — TROPONIN I: Troponin I: <0.03 ng/mL (ref <0.03)

## 2017-05-30 LAB — LIPASE, BLOOD: Lipase: 31 U/L (ref 11–51)

## 2017-05-30 MED ORDER — ONDANSETRON 4 MG PO TBDP: 4.0000 mg | ORAL_TABLET | Freq: Three times a day (TID) | ORAL | 0 refills | Status: DC | PRN

## 2017-05-30 MED ORDER — OXYCODONE HCL 5 MG PO TABS
5.0000 mg | ORAL_TABLET | Freq: Once | ORAL | Status: AC
  Administered 2017-05-30: 5 mg via ORAL
  Filled 2017-05-30: qty 1

## 2017-05-30 MED ORDER — OMEPRAZOLE 20 MG PO CPDR: 20.0000 mg | DELAYED_RELEASE_CAPSULE | Freq: Two times a day (BID) | ORAL | 0 refills | Status: DC

## 2017-05-30 MED ORDER — OXYCODONE-ACETAMINOPHEN 5-325 MG PO TABS: 2.0000 | ORAL_TABLET | ORAL | 0 refills | Status: DC | PRN

## 2017-05-30 MED ORDER — MORPHINE SULFATE (PF) 4 MG/ML IV SOLN
4.0000 mg | Freq: Once | INTRAVENOUS | Status: AC
  Administered 2017-05-30: 4 mg via INTRAVENOUS
  Filled 2017-05-30: qty 1

## 2017-05-30 MED ORDER — IOPAMIDOL (ISOVUE-300) INJECTION 61%
100.0000 mL | Freq: Once | INTRAVENOUS | Status: AC | PRN
  Administered 2017-05-30: 100 mL via INTRAVENOUS

## 2017-05-30 MED ORDER — ACETAMINOPHEN 500 MG PO TABS
1000.0000 mg | ORAL_TABLET | Freq: Once | ORAL | Status: AC
  Administered 2017-05-30: 1000 mg via ORAL
  Filled 2017-05-30: qty 2

## 2017-05-30 MED ORDER — SUCRALFATE 1 G PO TABS: 1.0000 g | ORAL_TABLET | Freq: Four times a day (QID) | ORAL | 0 refills | Status: DC

## 2017-05-30 MED ORDER — ONDANSETRON HCL 4 MG/2ML IJ SOLN
4.0000 mg | Freq: Once | INTRAMUSCULAR | Status: AC
  Administered 2017-05-30: 4 mg via INTRAVENOUS
  Filled 2017-05-30: qty 2

## 2017-05-30 MED ORDER — MORPHINE SULFATE (PF) 4 MG/ML IV SOLN
4.0000 mg | INTRAVENOUS | Status: DC | PRN
  Administered 2017-05-30: 4 mg via INTRAVENOUS
  Filled 2017-05-30: qty 1

## 2017-05-30 NOTE — ED Triage Notes (Addendum)
Patient complains of mid upper abdominal pain, worse when eating/drinking or taking deep breath in. Started Friday night. Denies nausea, vomiting/diarrhea. Denies blood in stool. Upper abdomen is tender to touch.

## 2017-05-30 NOTE — ED Notes (Signed)
EKG given to Dr. James  

## 2017-05-30 NOTE — ED Notes (Signed)
Pt requesting pain medication for headache.  Notified Dr. Lita Mains

## 2017-05-30 NOTE — ED Provider Notes (Signed)
Signed out from Dr. Jeneen Rinks pending CT abdomen and pelvis.  Patient with 4 days of epigastric pain exacerbated by eating.  CT with mild stranding around the head of the pancreas and dilation of the pancreatic duct.  Dilatation of pancreatic duct appears to be chronic chronic.  Mild elevation in white blood cell count but normal lipase.  Patient states his pain is significantly improved.  He has been able to drink small amount of water without discomfort, nausea or vomiting.  Discussed CT results with Dr. Gala Romney.  Recommends clear liquid diet and close follow-up in the office to arrange further imaging.  Patient voiced understanding and has been given return precautions.   Julianne Rice, MD 05/30/17 [place]

## 2017-05-31 ENCOUNTER — Telehealth: Payer: Self-pay | Admitting: Gastroenterology

## 2017-05-31 NOTE — Telephone Encounter (Signed)
Pt is aware and I am mailing him the Full liquid diet.

## 2017-05-31 NOTE — Telephone Encounter (Signed)
LMOM for a return call. Per CM pt is scheduled for 06/20/2017 at 11:30 Am with Roseanne Kaufman, NP.

## 2017-05-31 NOTE — Telephone Encounter (Signed)
Dr. Oneida Alar, we do not have any appts right away. Please advise!

## 2017-05-31 NOTE — Telephone Encounter (Signed)
PT is aware of his appt. He said every time he takes a bite of solid foods he feels full and his stomach hurts.But he does not have any nausea.   He was asking about just having clear liquids until his appt time.  Please advise!

## 2017-05-31 NOTE — Telephone Encounter (Signed)
PLEASE CALL PT. HE SHOULD DRINK WATER TO KEEP HIS URINE LIGHT YELLOW. He can follow A FULL LIQUID DIET(FLD) until his appt and DRINK BOOST, ENSURE, OR CARNATION INSTANT BREAKFAST FOUR TIMES A DAY. HE SHOULD GO TO THE ED IF HE IS UNABLE TO KEEP ANYTHING DOWN FOR 24 HOURS.  HE CAN PICK UP A HANDOUT ON FLD OR WE CAN MAIL HIM ONE.

## 2017-05-31 NOTE — Telephone Encounter (Signed)
Pt was returning a call to DS. [phone]

## 2017-05-31 NOTE — Telephone Encounter (Signed)
See separate note.

## 2017-05-31 NOTE — Telephone Encounter (Signed)
PLEASE CALL PT. I REVIEWED LABS AND CT FROM ED. HIS LIPASE WAS NORMAL AND ON THE CT HE HAS MILD CHANGES IN HIS PANCREAS AND NO INFECTION. HE DOES NOT NEED TO BEEN SEEN IN ONE DAY. THE EARLIEST APPT WE HAVE IS JUN 11.

## 2017-05-31 NOTE — Telephone Encounter (Signed)
Pt LMOM that he was seen in the ER and needed to make an OV. I called him back and offer him 6/11. His wife said that he couldn't wait that long and the ER doctor said that he needed to be seen in [place]. I told her we were in mid August with scheduling and 6/11 was the soonest I have at the moment. She said that he had a inflamed pancreas with infection. Please advise when we can bring this patient in. [phone]

## 2017-06-04 NOTE — ED Provider Notes (Signed)
[facility] EMERGENCY DEPARTMENT Provider Note   CSN: [id number] Arrival date & time: 05/30/17  1015     History   Chief Complaint Chief Complaint  Patient presents with  . Abdominal Pain    HPI Wayne Pratt is a 57 y.o. male.  Complaint is epigastric pain.  HPI 57 year old male.  Abdominal pain is epigastrium for several days.  Worsening.  Has pain postprandial.  Feels full.  Nausea but no vomiting.  No dark or black stools.  Not colicky or intermittent.  History of anemia, nonindependent diabetes, hypertension, kidney stones, empyema, high cholesterol, hypertension.  Does not drink.  He is a non-smoker.  No excessive anti-inflammatory use.  No excessive caffeine  Past Medical History:  Diagnosis Date  . Anemia    takes Iron pill daily  . Anxiety    takes Xanax daily  . Arthritis   . Diabetes mellitus ([place])    takes Metformin daily  . History of bronchitis    mid 90's  . History of hypertension    lost weight and been off meds over a yr  . History of kidney stones   . History of pleural empyema   . Hypercholesterolemia    lost weight and been off meds over a yr  . Hypertension   . Insomnia    takes Trazodone nightly  . Kidney stones    22 times  . Neuropathy   . Pneumonia    hx of-15+ yrs ago  . Sleep apnea    Stop Bang score of 7  . Weakness    numbness and tingling    Patient Active Problem List   Diagnosis Date Noted  . S/P insertion of spinal cord stimulator 08/01/2015  . Rectal bleeding 07/17/2014  . Anemia 07/17/2014  . Fatty liver 09/07/2011  . Diabetes mellitus ([place]) 09/07/2011  . Encounter for screening colonoscopy 09/07/2011  . Chronic pain 09/07/2011    Past Surgical History:  Procedure Laterality Date  . BIOPSY  10/05/2011   Dr. Oneida Alar :non-erosive gastritis  . BIOPSY  08/06/2014   Procedure: BIOPSY;  Surgeon: Danie Binder, MD;  Location: [facility];  Service: Endoscopy;;  . CERVICAL DISC SURGERY    . ELBOW SURGERY Right   .  ESOPHAGOGASTRODUODENOSCOPY  Sept 2013   Dr. Oneida Alar: chronic inactive gastritis   . ESOPHAGOGASTRODUODENOSCOPY (EGD) WITH PROPOFOL N/A 08/06/2014   Procedure: ESOPHAGOGASTRODUODENOSCOPY (EGD) WITH PROPOFOL;  Surgeon: Danie Binder, MD;  Location: [facility];  Service: Endoscopy;  Laterality: N/A;  . FLEXIBLE SIGMOIDOSCOPY  10/05/2011   Dr. Fields:poor prep  . LUNG SURGERY  2010   for empyema  . SPINAL CORD STIMULATOR INSERTION N/A 08/01/2015   Procedure: Spinal Cord Stimulator Implant;  Surgeon: Eustace Moore, MD;  Location: [place] [facility];  Service: Neurosurgery;  Laterality: N/A;  . TONSILLECTOMY    . VASECTOMY Bilateral 1995        Home Medications    Prior to Admission medications   Medication Sig Start Date End Date Taking? Authorizing Provider  acetaminophen (TYLENOL) 325 MG tablet Take 650 mg by mouth every 6 (six) hours as needed for mild pain.   Yes [provider]  ALPRAZolam Duanne Moron) 0.5 MG tablet Take 0.5 mg by mouth 2 (two) times daily as needed for anxiety.   Yes [provider]  cyclobenzaprine (FLEXERIL) 10 MG tablet Take 1 tablet by mouth 3 (three) times daily as needed for muscle spasms. 04/14/17  Yes [provider]  GLIPIZIDE XL  5 MG 24 hr tablet Take 1 tablet by mouth daily. 04/01/17  Yes [provider]  lisinopril (PRINIVIL,ZESTRIL) 10 MG tablet Take 10 mg by mouth daily.  04/19/17  Yes [provider]  lovastatin (MEVACOR) 40 MG tablet Take 40 mg by mouth at bedtime.  04/01/17  Yes [provider]  metFORMIN (GLUCOPHAGE) 1000 MG tablet Take 1,000 mg by mouth 3 (three) times daily.  04/01/17  Yes [provider]  naproxen (NAPROSYN) 500 MG tablet Take 500 mg by mouth 2 (two) times daily with a meal.  02/08/17  Yes [provider]  Oxycodone HCl 10 MG TABS Take 10 mg by mouth every 4 (four) hours.    Yes [provider]  ondansetron (ZOFRAN ODT) 4 MG disintegrating tablet Take 1 tablet (4 mg total) by  mouth every 8 (eight) hours as needed for nausea. 05/30/17   Julianne Rice, MD  oxyCODONE-acetaminophen (PERCOCET) 5-325 MG tablet Take 2 tablets by mouth every 4 (four) hours as needed for severe pain. 05/30/17   Julianne Rice, MD    Family History Family History  Problem Relation Age of Onset  . Prostate cancer Other        all male members on mother's side  . Colon cancer Neg Hx     Social History Social History   Tobacco Use  . Smoking status: Current Every Day Smoker    Packs/day: 1.00    Years: 39.00    Pack years: 39.00    Types: Cigarettes  . Smokeless tobacco: Never Used  Substance Use Topics  . Alcohol use: No    Alcohol/week: 0.0 oz  . Drug use: No     Allergies   Penicillins and Ativan [lorazepam]   Review of Systems Review of Systems  Constitutional: Negative for appetite change, chills, diaphoresis, fatigue and fever.  HENT: Negative for mouth sores, sore throat and trouble swallowing.   Eyes: Negative for visual disturbance.  Respiratory: Negative for cough, chest tightness, shortness of breath and wheezing.   Cardiovascular: Negative for chest pain.  Gastrointestinal: Positive for abdominal pain. Negative for abdominal distention, diarrhea, nausea and vomiting.       Early satiety, distention  Endocrine: Negative for polydipsia, polyphagia and polyuria.  Genitourinary: Negative for dysuria, frequency and hematuria.  Musculoskeletal: Negative for gait problem.  Skin: Negative for color change, pallor and rash.  Neurological: Negative for dizziness, syncope, light-headedness and headaches.  Hematological: Does not bruise/bleed easily.  Psychiatric/Behavioral: Negative for behavioral problems and confusion.     Physical Exam Updated Vital Signs BP 122/81   Pulse 70   Temp 98 F (36.7 C) (Oral)   Resp 18   Ht 6\' 1"  (1.854 m)   Wt 111.1 kg (245 lb)   SpO2 94%   BMI 32.32 kg/m   Physical Exam  Constitutional: He is oriented to person,  place, and time. He appears well-developed and well-nourished. No distress.  HENT:  Head: Normocephalic.  Eyes: Pupils are equal, round, and reactive to light. Conjunctivae are normal. No scleral icterus.  Neck: Normal range of motion. Neck supple. No thyromegaly present.  Cardiovascular: Normal rate and regular rhythm. Exam reveals no gallop and no friction rub.  No murmur heard. Pulmonary/Chest: Effort normal and breath sounds normal. No respiratory distress. He has no wheezes. He has no rales.  Abdominal: Soft. Bowel sounds are normal. He exhibits no distension. There is no tenderness. There is no rebound.  Soft abdomen.  No guarding or rebound.  No  peritoneal irritation.  Musculoskeletal: Normal range of motion.  Neurological: He is alert and oriented to person, place, and time.  Skin: Skin is warm and dry. No rash noted.  Psychiatric: He has a normal mood and affect. His behavior is normal.     ED Treatments / Results  Labs (all labs ordered are listed, but only abnormal results are displayed) Labs Reviewed  COMPREHENSIVE METABOLIC PANEL - Abnormal; Notable for the following components:      Result Value   Chloride 99 (*)    Glucose, Bld 180 (*)    All other components within normal limits  CBC - Abnormal; Notable for the following components:   WBC 10.7 (*)    All other components within normal limits  URINALYSIS, ROUTINE W REFLEX MICROSCOPIC - Abnormal; Notable for the following components:   Glucose, UA 150 (*)    All other components within normal limits  LIPASE, BLOOD  TROPONIN I    EKG EKG Interpretation  Date/Time:  Monday May 30 2017 10:38:36 EDT Ventricular Rate:  94 PR Interval:  128 QRS Duration: 86 QT Interval:  334 QTC Calculation: 417 R Axis:   0 Text Interpretation:  Normal sinus rhythm Nonspecific ST and T wave abnormality Abnormal ECG Confirmed by Tanna Furry [phone]) on 05/30/2017 1:10:54 PM   Radiology No results found.  Procedures Procedures  (including critical care time)  Medications Ordered in ED Medications  ondansetron (ZOFRAN) injection 4 mg (4 mg Intravenous Given 05/30/17 1323)  morphine 4 MG/ML injection 4 mg (4 mg Intravenous Given 05/30/17 1555)  iopamidol (ISOVUE-300) 61 % injection 100 mL (100 mLs Intravenous Contrast Given 05/30/17 1627)  acetaminophen (TYLENOL) tablet 1,000 mg (1,000 mg Oral Given 05/30/17 1721)  oxyCODONE (Oxy IR/ROXICODONE) immediate release tablet 5 mg (5 mg Oral Given 05/30/17 1822)     Initial Impression / Assessment and Plan / ED Course  I have reviewed the triage vital signs and the nursing notes.  Pertinent labs & imaging results that were available during my care of the patient were reviewed by me and considered in my medical decision making (see chart for details).    Given pain medication and PPI.  Labs are reassuring.  No elevation of lipase or hepatobiliary enzymes.  Likely peptic ulcer disease.  CT obtained to rule out perforation.  CT scan will be checked by my partner.  If negative, would recommend GI follow-up, H2 blocker or PPI.  Final Clinical Impressions(s) / ED Diagnoses   Final diagnoses:  Epigastric pain    ED Discharge Orders        Ordered    ondansetron (ZOFRAN ODT) 4 MG disintegrating tablet  Every 8 hours PRN,   Status:  Discontinued     05/30/17 1539    sucralfate (CARAFATE) 1 g tablet  4 times daily,   Status:  Discontinued     05/30/17 1539    omeprazole (PRILOSEC) 20 MG capsule  2 times daily,   Status:  Discontinued     05/30/17 1539    ondansetron (ZOFRAN ODT) 4 MG disintegrating tablet  Every 8 hours PRN     05/30/17 1855    oxyCODONE-acetaminophen (PERCOCET) 5-325 MG tablet  Every 4 hours PRN     05/30/17 Randol Kern       Tanna Furry, MD 06/04/17 1155

## 2017-06-17 DIAGNOSIS — Z1211 Encounter for screening for malignant neoplasm of colon: Secondary | ICD-10-CM | POA: Diagnosis not present

## 2017-06-17 DIAGNOSIS — M79672 Pain in left foot: Secondary | ICD-10-CM | POA: Diagnosis not present

## 2017-06-17 DIAGNOSIS — E1142 Type 2 diabetes mellitus with diabetic polyneuropathy: Secondary | ICD-10-CM | POA: Diagnosis not present

## 2017-06-17 DIAGNOSIS — Z79899 Other long term (current) drug therapy: Secondary | ICD-10-CM | POA: Diagnosis not present

## 2017-06-17 DIAGNOSIS — R251 Tremor, unspecified: Secondary | ICD-10-CM | POA: Diagnosis not present

## 2017-06-17 DIAGNOSIS — I10 Essential (primary) hypertension: Secondary | ICD-10-CM | POA: Diagnosis not present

## 2017-06-17 DIAGNOSIS — E1140 Type 2 diabetes mellitus with diabetic neuropathy, unspecified: Secondary | ICD-10-CM | POA: Diagnosis not present

## 2017-06-20 ENCOUNTER — Ambulatory Visit (INDEPENDENT_AMBULATORY_CARE_PROVIDER_SITE_OTHER): Payer: PPO | Admitting: Gastroenterology

## 2017-06-20 ENCOUNTER — Encounter: Payer: Self-pay | Admitting: Gastroenterology

## 2017-06-20 VITALS — BP 142/94 | HR 102 | Temp 98.6°F | Ht 73.0 in | Wt 234.8 lb

## 2017-06-20 DIAGNOSIS — R1013 Epigastric pain: Secondary | ICD-10-CM | POA: Insufficient documentation

## 2017-06-20 DIAGNOSIS — K8689 Other specified diseases of pancreas: Secondary | ICD-10-CM | POA: Insufficient documentation

## 2017-06-20 MED ORDER — PANTOPRAZOLE SODIUM 40 MG PO TBEC: 40.0000 mg | DELAYED_RELEASE_TABLET | Freq: Every day | ORAL | 1 refills | Status: DC

## 2017-06-20 NOTE — Progress Notes (Signed)
Primary Care Physician:  Patient, No Pcp Per Primary Gastroenterologist:  Dr. Oneida Alar   Chief Complaint  Patient presents with  . Abdominal Pain    no pain x 1 week    HPI:   Wayne Pratt is a 57 y.o. male presenting today after ED presentation in May 2019 with epigastric pain. Lipase was normal, LFTs normal at that time. Mild leukocytosis at 10.7. CT abd/pelvis with contrast noted peripancreatic stranding around the pancreatic head, mild distension of main pancreatic duct within head of pancreas, stable from 2016, benign-appearing liver cysts. Normal gallbladder appearance. He was last seen in this office July 17, 2014.   05/30/17: ED. 3 weeks prior had uncomfortable abdominal pain, worsening and prompting presentation to ED. Epigastric pain noted, never radiated. No PPI prior to presentation to ED. No PPI currently. Pain is gone now. No associated N/V. Was unable to eat or drink anything, which doubled him over. Appetite normal. No pain with eating now. Naproxen prn for headaches. Approximately twice per month. No GERD symptoms. Ate a steak and shake last week, had a greasy burger. Didn't sit too well, otherwise did ok. Known history of mild non-erosive gastritis and duodenitis on EGD in 2016. Has never completed a colonoscopy as he has been unable to tolerate the prep. Wife insists that he can't drink large volumes, even of Miralax prep, due to vomiting. PCP has ordered cologuard. He wants to hold off on colonoscopy unless cologuard is positive.     Past Medical History:  Diagnosis Date  . Anemia    takes Iron pill daily  . Anxiety    takes Xanax daily  . Arthritis   . Diabetes mellitus ([place])    takes Metformin daily  . History of bronchitis    mid 90's  . History of hypertension    lost weight and been off meds over a yr  . History of kidney stones   . History of pleural empyema   . Hypercholesterolemia    lost weight and been off meds over a yr  . Hypertension   .  Insomnia    takes Trazodone nightly  . Kidney stones    22 times  . Neuropathy   . Pneumonia    hx of-15+ yrs ago  . Sleep apnea    Stop Bang score of 7  . Weakness    numbness and tingling    Past Surgical History:  Procedure Laterality Date  . BIOPSY  10/05/2011   Dr. Oneida Alar :non-erosive gastritis  . BIOPSY  08/06/2014   Procedure: BIOPSY;  Surgeon: Danie Binder, MD;  Location: [facility];  Service: Endoscopy;;  . CERVICAL DISC SURGERY    . ELBOW SURGERY Right   . ESOPHAGOGASTRODUODENOSCOPY  Sept 2013   Dr. Oneida Alar: chronic inactive gastritis   . ESOPHAGOGASTRODUODENOSCOPY (EGD) WITH PROPOFOL N/A 08/06/2014   mild non-erosive gastritis, duodenitis  . FLEXIBLE SIGMOIDOSCOPY  10/05/2011   Dr. Fields:poor prep  . LUNG SURGERY  2010   for empyema  . SPINAL CORD STIMULATOR INSERTION N/A 08/01/2015   Procedure: Spinal Cord Stimulator Implant;  Surgeon: Eustace Moore, MD;  Location: [place] [facility];  Service: Neurosurgery;  Laterality: N/A;  . TONSILLECTOMY    . VASECTOMY Bilateral 1995    Current Outpatient Medications  Medication Sig Dispense Refill  . acetaminophen (TYLENOL) 325 MG tablet Take 650 mg by mouth every 6 (six) hours as needed for mild pain.    . clonazePAM (KLONOPIN) 0.5  MG tablet Take 1 tablet by mouth 2 (two) times daily.    . cyclobenzaprine (FLEXERIL) 10 MG tablet Take 1 tablet by mouth 3 (three) times daily as needed for muscle spasms.    [name] Kitchen GLIPIZIDE XL 5 MG 24 hr tablet Take 1 tablet by mouth daily.    [name] Kitchen lisinopril (PRINIVIL,ZESTRIL) 10 MG tablet Take 10 mg by mouth daily.     [name] Kitchen lovastatin (MEVACOR) 40 MG tablet Take 40 mg by mouth at bedtime.     . metFORMIN (GLUCOPHAGE) 1000 MG tablet Take 1,000 mg by mouth 2 (two) times daily.     . naproxen (NAPROSYN) 500 MG tablet Take 500 mg by mouth as needed.     . Oxycodone HCl 10 MG TABS Take 10 mg by mouth every 4 (four) hours.     . pantoprazole (PROTONIX) 40 MG tablet Take 1 tablet (40 mg total) by mouth daily. 30  minutes before breakfast 90 tablet 1   No current facility-administered medications for this visit.     Allergies as of 06/20/2017 - Review Complete 06/20/2017  Allergen Reaction Noted  . Penicillins Anaphylaxis 08/18/2011  . Ativan [lorazepam] Other (See Comments) 09/20/2011    Family History  Problem Relation Age of Onset  . Prostate cancer Other        all male members on mother's side  . Colon cancer Neg Hx     Social History   Socioeconomic History  . Marital status: Married    Spouse name: Not on file  . Number of children: 2  . Years of education: Not on file  . Highest education level: Not on file  Occupational History  . Occupation: disabled    Comment: 2005  Social Needs  . Financial resource strain: Not on file  . Food insecurity:    Worry: Not on file    Inability: Not on file  . Transportation needs:    Medical: Not on file    Non-medical: Not on file  Tobacco Use  . Smoking status: Current Every Day Smoker    Packs/day: 1.00    Years: 39.00    Pack years: 39.00    Types: Cigarettes  . Smokeless tobacco: Never Used  Substance and Sexual Activity  . Alcohol use: No    Alcohol/week: 0.0 oz  . Drug use: No  . Sexual activity: Yes    Birth control/protection: None  Lifestyle  . Physical activity:    Days per week: Not on file    Minutes per session: Not on file  . Stress: Not on file  Relationships  . Social connections:    Talks on phone: Not on file    Gets together: Not on file    Attends religious service: Not on file    Active member of club or organization: Not on file    Attends meetings of clubs or organizations: Not on file    Relationship status: Not on file  . Intimate partner violence:    Fear of current or ex partner: Not on file    Emotionally abused: Not on file    Physically abused: Not on file    Forced sexual activity: Not on file  Other Topics Concern  . Not on file  Social History Narrative  . Not on file    Review  of Systems: As mentioned in HPI   Physical Exam: BP (!) 142/94   Pulse (!) 102   Temp 98.6 F (37 C) (Oral)   Ht  6\' 1"  (1.854 m)   Wt 234 lb 12.8 oz (106.5 kg)   BMI 30.98 kg/m  General:   Alert and oriented. Pleasant and cooperative. Well-nourished and well-developed.  Head:  Normocephalic and atraumatic. Eyes:  Without icterus, sclera clear and conjunctiva pink.  Ears:  Normal auditory acuity. Nose:  No deformity, discharge,  or lesions. Mouth:  No deformity or lesions, oral mucosa pink.  Neck:  Supple, without mass or thyromegaly. Lungs:  Clear to auscultation bilaterally.  Heart:  S1, S2 present without murmurs appreciated.  Abdomen:  +BS, soft, non-tender and non-distended. No HSM noted. No guarding or rebound. No masses appreciated.  Rectal:  Deferred  Msk:  Symmetrical without gross deformities. Normal posture. Extremities:  Without  edema. Neurologic:  Alert and  oriented x4 Psych:  Alert and cooperative. Normal mood and affect.  Lab Results  Component Value Date   WBC 10.7 (H) 05/30/2017   HGB 15.6 05/30/2017   HCT 46.6 05/30/2017   MCV 91.0 05/30/2017   PLT 256 05/30/2017   Lab Results  Component Value Date   ALT 18 05/30/2017   AST 19 05/30/2017   ALKPHOS 62 05/30/2017   BILITOT 0.6 05/30/2017   Lab Results  Component Value Date   LIPASE 31 05/30/2017

## 2017-06-20 NOTE — Patient Instructions (Signed)
We are ordering a pancreatic protocol CT to follow-up on prior CT findings.  I recommend starting Protonix once each morning, 30 minutes before breakfast.   Further recommendations after imaging is completed!  It was a pleasure to see you today. I strive to create trusting relationships with patients to provide genuine, compassionate, and quality care. I value your feedback. If you receive a survey regarding your visit,  I greatly appreciate you taking time to fill this out.   Annitta Needs, PhD, ANP-BC [facility]

## 2017-06-21 ENCOUNTER — Ambulatory Visit: Payer: PPO | Admitting: Nurse Practitioner

## 2017-06-22 ENCOUNTER — Encounter: Payer: Self-pay | Admitting: Gastroenterology

## 2017-06-23 DIAGNOSIS — G894 Chronic pain syndrome: Secondary | ICD-10-CM | POA: Diagnosis not present

## 2017-06-23 DIAGNOSIS — E1142 Type 2 diabetes mellitus with diabetic polyneuropathy: Secondary | ICD-10-CM | POA: Diagnosis not present

## 2017-06-23 DIAGNOSIS — Z79891 Long term (current) use of opiate analgesic: Secondary | ICD-10-CM | POA: Diagnosis not present

## 2017-06-23 DIAGNOSIS — M62830 Muscle spasm of back: Secondary | ICD-10-CM | POA: Diagnosis not present

## 2017-06-24 DIAGNOSIS — M7731 Calcaneal spur, right foot: Secondary | ICD-10-CM | POA: Diagnosis not present

## 2017-06-24 DIAGNOSIS — M7732 Calcaneal spur, left foot: Secondary | ICD-10-CM | POA: Diagnosis not present

## 2017-06-24 NOTE — Assessment & Plan Note (Signed)
57 year old male with acute epigastric pain in May 2019, presenting to the ED. Normal lipase and LFTs, but CT with peripancreatic stranding and mild distension of main pancreatic duct within the head of the pancreas that was stable from 2016. Symptomatically, he is improved and doing well. Unable to pursue MRCP as he has a spinal cord stimulator. I have ordered a CT with pancreatic protocol. May need EUS but will await imaging. I have started him on Protonix once daily. Takes intermittent NSAIDs. History of GERD but asymptomatic currently; last EGD with gastritis/duodenitis. As of note, he has never had a colonoscopy and has been unable to tolerate prep. Declining further attempts until PCP has completed cologuard testing. Further recommendations after updated imaging.

## 2017-07-04 ENCOUNTER — Encounter (HOSPITAL_COMMUNITY): Payer: Self-pay

## 2017-07-04 ENCOUNTER — Ambulatory Visit (HOSPITAL_COMMUNITY)
Admission: RE | Admit: 2017-07-04 | Discharge: 2017-07-04 | Disposition: A | Discharge status: Home / Self Care | Payer: PPO | Source: Ambulatory Visit | Attending: Gastroenterology | Admitting: Gastroenterology

## 2017-07-04 DIAGNOSIS — I2510 Atherosclerotic heart disease of native coronary artery without angina pectoris: Secondary | ICD-10-CM | POA: Diagnosis not present

## 2017-07-04 DIAGNOSIS — K8689 Other specified diseases of pancreas: Secondary | ICD-10-CM | POA: Diagnosis not present

## 2017-07-04 DIAGNOSIS — I700 Atherosclerosis of aorta: Secondary | ICD-10-CM | POA: Diagnosis not present

## 2017-07-04 DIAGNOSIS — K5900 Constipation, unspecified: Secondary | ICD-10-CM | POA: Diagnosis not present

## 2017-07-04 LAB — POCT I-STAT CREATININE: Creatinine, Ser: 1 mg/dL (ref 0.61–1.24)

## 2017-07-04 MED ORDER — IOPAMIDOL (ISOVUE-300) INJECTION 61%
100.0000 mL | Freq: Once | INTRAVENOUS | Status: AC | PRN
  Administered 2017-07-04: 100 mL via INTRAVENOUS

## 2017-07-12 NOTE — Progress Notes (Signed)
CT with borderline prominent dorsal pancreatic duct, similar through 2016. No obvious mass. Would recommend EUS with Dr. Paulita Fujita to conclude evaluation of abnormal pancreatic findings on prior imaging.

## 2017-07-13 NOTE — Progress Notes (Signed)
Tried to call, vm not set up.

## 2017-07-15 ENCOUNTER — Telehealth: Payer: Self-pay | Admitting: Gastroenterology

## 2017-07-15 ENCOUNTER — Other Ambulatory Visit: Payer: Self-pay | Admitting: *Deleted

## 2017-07-15 ENCOUNTER — Telehealth: Payer: Self-pay

## 2017-07-15 DIAGNOSIS — K8689 Other specified diseases of pancreas: Secondary | ICD-10-CM

## 2017-07-15 NOTE — Telephone Encounter (Signed)
Pt called for CT results and is aware. OK to schedule EUS with Dr. Ardis Hughs.  Forwarding to [facility].

## 2017-07-15 NOTE — Telephone Encounter (Signed)
Referral has been placed. 

## 2017-07-15 NOTE — Progress Notes (Signed)
Letter mailed to call.  

## 2017-07-16 NOTE — Telephone Encounter (Signed)
Happy to help. While reviewing his records, I saw on recent CT result note that he was being referred to Dr. Paulita Fujita for this as well. Can you make sure he is not already set up for the test with Dr. Paulita Fujita?  If not, please arrange for upper EUS, radial +/- linear, next available EUS MAC Thursday for abnormal pancreas. Thanks

## 2017-07-18 ENCOUNTER — Telehealth: Payer: Self-pay | Admitting: Internal Medicine

## 2017-07-18 ENCOUNTER — Telehealth: Payer: Self-pay | Admitting: Gastroenterology

## 2017-07-18 ENCOUNTER — Other Ambulatory Visit: Payer: Self-pay

## 2017-07-18 DIAGNOSIS — K8689 Other specified diseases of pancreas: Secondary | ICD-10-CM

## 2017-07-18 NOTE — Telephone Encounter (Signed)
[phone] please call patient wife Hilda Blades, she has questions about his tests

## 2017-07-18 NOTE — Telephone Encounter (Signed)
Mindy, per my conversation with Talmage Coin to leave referral with Dr. Ardis Hughs.

## 2017-07-18 NOTE — Telephone Encounter (Signed)
Noted  

## 2017-07-18 NOTE — Telephone Encounter (Signed)
PT did not understand the results and plan of the EUS. Hilda Blades is aware now.

## 2017-07-18 NOTE — Telephone Encounter (Signed)
08/18/17 EUS at 130 pm.  Left message on machine to call back

## 2017-07-18 NOTE — Telephone Encounter (Signed)
I have sent staff message to [facility] with Dr. Eugenia Pancoast office letting them know. FYI to [place] as well.

## 2017-07-18 NOTE — Telephone Encounter (Signed)
[phone]  Please call patient, he has questions about where we referred him and wants to know what tests we are sending him for

## 2017-07-18 NOTE — Telephone Encounter (Signed)
Wayne Merl, LPN spoke with Wayne Pratt our NP and she stated to leave referral as is with Dr. Ardis Hughs. Thank you!

## 2017-07-18 NOTE — Telephone Encounter (Addendum)
Called and informed pt of referral and reason. States he understands now.

## 2017-07-19 NOTE — Telephone Encounter (Signed)
Patient is scheduled for an EUS on 08/18/17 at [facility] at 1:15pm with Dr.Jacobs.

## 2017-07-19 NOTE — Telephone Encounter (Signed)
No answer unable to leave message on mobile number.  Left message on home number

## 2017-07-20 NOTE — Telephone Encounter (Signed)
I have again tried to reach the pt with no answer and no voice mail.  Instructions mailed and available via My Chart.  Referring notified that we have been unable to reach pt.

## 2017-07-25 NOTE — Telephone Encounter (Signed)
You are welcome Patty! Thank you for all of your help with our patients!

## 2017-07-25 NOTE — Telephone Encounter (Signed)
I called pt on home phone and he is aware.

## 2017-07-25 NOTE — Telephone Encounter (Signed)
Thank you for getting in touch with the pt.

## 2017-07-26 DIAGNOSIS — M79651 Pain in right thigh: Secondary | ICD-10-CM | POA: Diagnosis not present

## 2017-07-26 DIAGNOSIS — R112 Nausea with vomiting, unspecified: Secondary | ICD-10-CM | POA: Diagnosis not present

## 2017-07-26 DIAGNOSIS — W57XXXA Bitten or stung by nonvenomous insect and other nonvenomous arthropods, initial encounter: Secondary | ICD-10-CM | POA: Diagnosis not present

## 2017-08-18 ENCOUNTER — Ambulatory Visit (HOSPITAL_COMMUNITY): Payer: PPO | Admitting: Anesthesiology

## 2017-08-18 ENCOUNTER — Encounter (HOSPITAL_COMMUNITY): Payer: Self-pay | Admitting: *Deleted

## 2017-08-18 ENCOUNTER — Encounter (HOSPITAL_COMMUNITY)
Admission: RE | Disposition: A | Discharge status: Home / Self Care | Payer: Self-pay | Source: Ambulatory Visit | Attending: Gastroenterology

## 2017-08-18 ENCOUNTER — Other Ambulatory Visit: Payer: Self-pay

## 2017-08-18 ENCOUNTER — Ambulatory Visit (HOSPITAL_COMMUNITY)
Admission: RE | Admit: 2017-08-18 | Discharge: 2017-08-18 | Disposition: A | Discharge status: Home / Self Care | Payer: PPO | Source: Ambulatory Visit | Attending: Gastroenterology | Admitting: Gastroenterology

## 2017-08-18 DIAGNOSIS — Z888 Allergy status to other drugs, medicaments and biological substances status: Secondary | ICD-10-CM | POA: Insufficient documentation

## 2017-08-18 DIAGNOSIS — G4700 Insomnia, unspecified: Secondary | ICD-10-CM | POA: Diagnosis not present

## 2017-08-18 DIAGNOSIS — I10 Essential (primary) hypertension: Secondary | ICD-10-CM | POA: Insufficient documentation

## 2017-08-18 DIAGNOSIS — G4730 Sleep apnea, unspecified: Secondary | ICD-10-CM | POA: Insufficient documentation

## 2017-08-18 DIAGNOSIS — R531 Weakness: Secondary | ICD-10-CM | POA: Diagnosis not present

## 2017-08-18 DIAGNOSIS — E7800 Pure hypercholesterolemia, unspecified: Secondary | ICD-10-CM | POA: Diagnosis not present

## 2017-08-18 DIAGNOSIS — F419 Anxiety disorder, unspecified: Secondary | ICD-10-CM | POA: Diagnosis not present

## 2017-08-18 DIAGNOSIS — E119 Type 2 diabetes mellitus without complications: Secondary | ICD-10-CM | POA: Diagnosis not present

## 2017-08-18 DIAGNOSIS — Z880 Allergy status to penicillin: Secondary | ICD-10-CM | POA: Diagnosis not present

## 2017-08-18 DIAGNOSIS — Z87442 Personal history of urinary calculi: Secondary | ICD-10-CM | POA: Insufficient documentation

## 2017-08-18 DIAGNOSIS — Z8042 Family history of malignant neoplasm of prostate: Secondary | ICD-10-CM | POA: Insufficient documentation

## 2017-08-18 DIAGNOSIS — G8929 Other chronic pain: Secondary | ICD-10-CM | POA: Diagnosis not present

## 2017-08-18 DIAGNOSIS — K8689 Other specified diseases of pancreas: Secondary | ICD-10-CM

## 2017-08-18 DIAGNOSIS — E1140 Type 2 diabetes mellitus with diabetic neuropathy, unspecified: Secondary | ICD-10-CM | POA: Diagnosis not present

## 2017-08-18 DIAGNOSIS — F17210 Nicotine dependence, cigarettes, uncomplicated: Secondary | ICD-10-CM | POA: Diagnosis not present

## 2017-08-18 DIAGNOSIS — D649 Anemia, unspecified: Secondary | ICD-10-CM | POA: Diagnosis not present

## 2017-08-18 DIAGNOSIS — M1990 Unspecified osteoarthritis, unspecified site: Secondary | ICD-10-CM | POA: Insufficient documentation

## 2017-08-18 HISTORY — PX: EUS: SHX5427

## 2017-08-18 HISTORY — PX: ESOPHAGOGASTRODUODENOSCOPY: SHX5428

## 2017-08-18 LAB — GLUCOSE, CAPILLARY: Glucose-Capillary: 180 mg/dL — ABNORMAL HIGH (ref 70–99)

## 2017-08-18 SURGERY — UPPER ENDOSCOPIC ULTRASOUND (EUS) RADIAL
Anesthesia: Monitor Anesthesia Care

## 2017-08-18 MED ORDER — PROPOFOL 10 MG/ML IV BOLUS
INTRAVENOUS | Status: AC
  Filled 2017-08-18: qty 40

## 2017-08-18 MED ORDER — LACTATED RINGERS IV SOLN
INTRAVENOUS | Status: DC
  Administered 2017-08-18: 11:00:00 via INTRAVENOUS

## 2017-08-18 MED ORDER — PROPOFOL 10 MG/ML IV BOLUS
INTRAVENOUS | Status: AC
  Filled 2017-08-18: qty 20

## 2017-08-18 MED ORDER — SODIUM CHLORIDE 0.9 % IV SOLN: INTRAVENOUS | Status: DC

## 2017-08-18 MED ORDER — PROPOFOL 500 MG/50ML IV EMUL
INTRAVENOUS | Status: DC | PRN
  Administered 2017-08-18: 110 ug/kg/min via INTRAVENOUS

## 2017-08-18 MED ORDER — PROPOFOL 10 MG/ML IV BOLUS
INTRAVENOUS | Status: DC | PRN
  Administered 2017-08-18: 20 mg via INTRAVENOUS
  Administered 2017-08-18: 40 mg via INTRAVENOUS

## 2017-08-18 MED ORDER — LIDOCAINE 2% (20 MG/ML) 5 ML SYRINGE
INTRAMUSCULAR | Status: DC | PRN
  Administered 2017-08-18: 50 mg via INTRAVENOUS

## 2017-08-18 NOTE — Discharge Instructions (Signed)
YOU HAD AN ENDOSCOPIC PROCEDURE TODAY: Refer to the procedure report and other information in the discharge instructions given to you for any specific questions about what was found during the examination. If this information does not answer your questions, please call [place] office at [phone] to clarify.  ° °YOU SHOULD EXPECT: Some feelings of bloating in the abdomen. Passage of more gas than usual. Walking can help get rid of the air that was put into your GI tract during the procedure and reduce the bloating. If you had a lower endoscopy (such as a colonoscopy or flexible sigmoidoscopy) you may notice spotting of blood in your stool or on the toilet paper. Some abdominal soreness may be present for a day or two, also. ° °DIET: Your first meal following the procedure should be a light meal and then it is ok to progress to your normal diet. A half-sandwich or bowl of soup is an example of a good first meal. Heavy or fried foods are harder to digest and may make you feel nauseous or bloated. Drink plenty of fluids but you should avoid alcoholic beverages for 24 hours. If you had a esophageal dilation, please see attached instructions for diet.   ° °ACTIVITY: Your care partner should take you home directly after the procedure. You should plan to take it easy, moving slowly for the rest of the day. You can resume normal activity the day after the procedure however YOU SHOULD NOT DRIVE, use power tools, machinery or perform tasks that involve climbing or major physical exertion for 24 hours (because of the sedation medicines used during the test).  ° °SYMPTOMS TO REPORT IMMEDIATELY: °A gastroenterologist can be reached at any hour. Please call [phone]  for any of the following symptoms:  °Following lower endoscopy (colonoscopy, flexible sigmoidoscopy) °Excessive amounts of blood in the stool  °Significant tenderness, worsening of abdominal pains  °Swelling of the abdomen that is new, acute  °Fever of 100° or  higher  °Following upper endoscopy (EGD, EUS, ERCP, esophageal dilation) °Vomiting of blood or coffee ground material  °New, significant abdominal pain  °New, significant chest pain or pain under the shoulder blades  °Painful or persistently difficult swallowing  °New shortness of breath  °Black, tarry-looking or red, bloody stools ° °FOLLOW UP:  °If any biopsies were taken you will be contacted by phone or by letter within the next 1-3 weeks. Call [phone]  if you have not heard about the biopsies in 3 weeks.  °Please also call with any specific questions about appointments or follow up tests. ° °

## 2017-08-18 NOTE — H&P (Signed)
HPI: This is a 57 yo man with chrnoically dilated PD  Former alcohololic but none in 2-3 decades. Never pancreatid disease. No FH of pancreatitis. 2-3 pounds weight loss in 6 months.  Presented may 2019 with abd pain, that went away in 2-3 days.  Chief complaint is abnormal PD  ROS: complete GI ROS as described in HPI, all other review negative.  Constitutional:  No unintentional weight loss   Past Medical History:  Diagnosis Date  . Anemia    takes Iron pill daily  . Anxiety    takes Xanax daily  . Arthritis   . Diabetes mellitus ([place])    takes Metformin daily  . History of bronchitis    mid 90's  . History of hypertension    lost weight and been off meds over a yr  . History of kidney stones   . History of pleural empyema   . Hypercholesterolemia    lost weight and been off meds over a yr  . Hypertension   . Insomnia    takes Trazodone nightly  . Kidney stones    22 times  . Neuropathy   . Pneumonia    hx of-15+ yrs ago  . Sleep apnea    Stop Bang score of 7  . Weakness    numbness and tingling    Past Surgical History:  Procedure Laterality Date  . BIOPSY  10/05/2011   Dr. Oneida Alar :non-erosive gastritis  . BIOPSY  08/06/2014   Procedure: BIOPSY;  Surgeon: Danie Binder, MD;  Location: [facility];  Service: Endoscopy;;  . CERVICAL DISC SURGERY    . ELBOW SURGERY Right   . ESOPHAGOGASTRODUODENOSCOPY  Sept 2013   Dr. Oneida Alar: chronic inactive gastritis   . ESOPHAGOGASTRODUODENOSCOPY (EGD) WITH PROPOFOL N/A 08/06/2014   mild non-erosive gastritis, duodenitis  . FLEXIBLE SIGMOIDOSCOPY  10/05/2011   Dr. Fields:poor prep  . LUNG SURGERY  2010   for empyema  . SPINAL CORD STIMULATOR INSERTION N/A 08/01/2015   Procedure: Spinal Cord Stimulator Implant;  Surgeon: Eustace Moore, MD;  Location: [facility];  Service: Neurosurgery;  Laterality: N/A;  . TONSILLECTOMY    . VASECTOMY Bilateral 1995    Current Facility-Administered Medications  Medication Dose Route  Frequency Provider Last Rate Last Dose  . lactated ringers infusion   Intravenous Continuous Milus Banister, MD 125 mL/hr at 08/18/17 1125      Allergies as of 07/18/2017 - Review Complete 07/04/2017  Allergen Reaction Noted  . Penicillins Anaphylaxis 08/18/2011  . Ativan [lorazepam] Other (See Comments) 09/20/2011    Family History  Problem Relation Age of Onset  . Prostate cancer Other        all male members on mother's side  . Colon cancer Neg Hx     Social History   Socioeconomic History  . Marital status: Married    Spouse name: Not on file  . Number of children: 2  . Years of education: Not on file  . Highest education level: Not on file  Occupational History  . Occupation: disabled    Comment: 2005  Social Needs  . Financial resource strain: Not on file  . Food insecurity:    Worry: Not on file    Inability: Not on file  . Transportation needs:    Medical: Not on file    Non-medical: Not on file  Tobacco Use  . Smoking status: Current Every Day Smoker    Packs/day: 1.00    Years: 39.00  Pack years: 39.00    Types: Cigarettes  . Smokeless tobacco: Never Used  Substance and Sexual Activity  . Alcohol use: No    Alcohol/week: 0.0 standard drinks  . Drug use: No  . Sexual activity: Yes    Birth control/protection: None  Lifestyle  . Physical activity:    Days per week: Not on file    Minutes per session: Not on file  . Stress: Not on file  Relationships  . Social connections:    Talks on phone: Not on file    Gets together: Not on file    Attends religious service: Not on file    Active member of club or organization: Not on file    Attends meetings of clubs or organizations: Not on file    Relationship status: Not on file  . Intimate partner violence:    Fear of current or ex partner: Not on file    Emotionally abused: Not on file    Physically abused: Not on file    Forced sexual activity: Not on file  Other Topics Concern  . Not on file   Social History Narrative  . Not on file     Physical Exam: BP (!) 145/85   Temp 98.1 F (36.7 C) (Oral)   Resp 14   Ht 6\' 1"  (1.854 m)   Wt 102.1 kg   SpO2 98%   BMI 29.69 kg/m  Constitutional: generally well-appearing Psychiatric: alert and oriented x3 Abdomen: soft, nontender, nondistended, no obvious ascites, no peritoneal signs, normal bowel sounds No peripheral edema noted in lower extremities  Assessment and plan: 57 y.o. male with abnormal PD  For upper EUS today  Please see the "Patient Instructions" section for addition details about the plan.  Owens Loffler, MD [facility] 08/18/2017, 1:03 PM

## 2017-08-18 NOTE — Anesthesia Preprocedure Evaluation (Addendum)
Anesthesia Evaluation    Reviewed: Allergy & Precautions, Patient's Chart, lab work & pertinent test results  Airway Mallampati: I  TM Distance: >3 FB Neck ROM: Full    Dental  (+) Edentulous Upper, Edentulous Lower, Lower Dentures, Upper Dentures   Pulmonary sleep apnea , Current Smoker,    breath sounds clear to auscultation       Cardiovascular hypertension, Pt. on medications negative cardio ROS   Rhythm:Regular Rate:Normal  EKG 05/30/17 NSR   Neuro/Psych Anxiety negative neurological ROS     GI/Hepatic negative GI ROS, Neg liver ROS,   Endo/Other  negative endocrine ROSdiabetes, Type 2, Oral Hypoglycemic Agents  Renal/GU negative Renal ROS     Musculoskeletal negative musculoskeletal ROS (+) Arthritis , Osteoarthritis,    Abdominal   Peds  Hematology negative hematology ROS (+)   Anesthesia Other Findings Chronic pain s/p spinal cord stimulator. Stimulator turned off. Day of surgery medications reviewed with the patient.  Reproductive/Obstetrics                            Anesthesia Physical Anesthesia Plan  ASA: III  Anesthesia Plan: MAC   Post-op Pain Management:    Induction: Intravenous  PONV Risk Score and Plan: 0 and Treatment may vary due to age or medical condition and Propofol infusion  Airway Management Planned: Simple Face Mask  Additional Equipment:   Intra-op Plan:   Post-operative Plan: Extubation in OR  Informed Consent: I have reviewed the patients History and Physical, chart, labs and discussed the procedure including the risks, benefits and alternatives for the proposed anesthesia with the patient or authorized representative who has indicated his/her understanding and acceptance.   Dental advisory given  Plan Discussed with: CRNA  Anesthesia Plan Comments:         Anesthesia Quick Evaluation

## 2017-08-18 NOTE — Transfer of Care (Signed)
Immediate Anesthesia Transfer of Care Note  Patient: Wayne Pratt  Procedure(s) Performed: UPPER ENDOSCOPIC ULTRASOUND (EUS) RADIAL (N/A )  Patient Location: PACU and Endoscopy Unit  Anesthesia Type:MAC  Level of Consciousness: awake, alert , oriented and patient cooperative  Airway & Oxygen Therapy: Patient Spontanous Breathing and Patient connected to nasal cannula oxygen  Post-op Assessment: Report given to RN, Post -op Vital signs reviewed and stable and Patient moving all extremities  Post vital signs: Reviewed and stable  Last Vitals:  Vitals Value Taken Time  BP    Temp    Pulse    Resp 20 08/18/2017  1:44 PM  SpO2    Vitals shown include unvalidated device data.  Last Pain:  Vitals:   08/18/17 1114  TempSrc: Oral  PainSc: 0-No pain         Complications: No apparent anesthesia complications

## 2017-08-18 NOTE — Anesthesia Postprocedure Evaluation (Signed)
Anesthesia Post Note  Patient: Wayne Pratt  Procedure(s) Performed: UPPER ENDOSCOPIC ULTRASOUND (EUS) RADIAL (N/A )     Patient location during evaluation: Endoscopy Anesthesia Type: MAC Level of consciousness: awake and alert Pain management: pain level controlled Vital Signs Assessment: post-procedure vital signs reviewed and stable Respiratory status: spontaneous breathing, nonlabored ventilation, respiratory function stable and patient connected to nasal cannula oxygen Cardiovascular status: blood pressure returned to baseline and stable Postop Assessment: no apparent nausea or vomiting Anesthetic complications: no    Last Vitals:  Vitals:   08/18/17 1400 08/18/17 1410  BP: (!) 151/99 (!) 139/91  Pulse: 70 72  Resp: 13 14  Temp:    SpO2: 98% 98%    Last Pain:  Vitals:   08/18/17 1410  TempSrc:   PainSc: 0-No pain                 Nuriyah Hanline L [name]

## 2017-08-18 NOTE — Op Note (Signed)
[facility] Patient Name: Wayne Pratt Procedure Date: 08/18/2017 MRN: [id number] Attending MD: Milus Banister , MD Date of Birth: 1960/02/17 CSN: [id number] Age: 57 Admit Type: Outpatient Procedure:                Upper EUS Indications:              Dilated pancreatic duct on CT scan, chronic Providers:                Milus Banister, MD, Cleda Daub, RN, William Dalton, Technician Referring MD:             Barney Drain, MD Medicines:                Monitored Anesthesia Care Complications:            No immediate complications. Estimated blood loss:                            None. Estimated Blood Loss:     Estimated blood loss: none. Procedure:                Pre-Anesthesia Assessment:                           - Prior to the procedure, a History and Physical                            was performed, and patient medications and                            allergies were reviewed. The patient's tolerance of                            previous anesthesia was also reviewed. The risks                            and benefits of the procedure and the sedation                            options and risks were discussed with the patient.                            All questions were answered, and informed consent                            was obtained. Prior Anticoagulants: The patient has                            taken no previous anticoagulant or antiplatelet                            agents. ASA Grade Assessment: II - A patient with  mild systemic disease. After reviewing the risks                            and benefits, the patient was deemed in                            satisfactory condition to undergo the procedure.                           After obtaining informed consent, the endoscope was                            passed under direct vision. Throughout the                            procedure, the  patient's blood pressure, pulse, and                            oxygen saturations were monitored continuously. The                            [id number] ([id number]) Olympus Radial EUS was                            introduced through the mouth, and advanced to the                            second part of duodenum. The [id number] ([id number])                            Olympus ERCP was introduced through the mouth, and                            advanced to the second part of duodenum. The upper                            EUS was accomplished without difficulty. The                            patient tolerated the procedure well. Scope In: Scope Out: Findings:      ENDOSCOPIC FINDING: :      The examined esophagus was endoscopically normal.      The entire examined stomach was endoscopically normal.      The examined duodenum was endoscopically normal except for small       periampullary diverticulum, including good duodenoscope views of the       major papilla.      ENDOSONOGRAPHIC FINDING: :      1. The main pancreatic duct was normal, non-dilated      2. CBD slightly dilated 65mm without retained stones.      3. Small hypoechoic soft tissue focus in the body of the pancreas,       indiscrete borders, 4.66mm across. Too small for FNA.      4. No peripancreatic adenopathy.      5. Limited views  of the liver, spleen, portal and splenic vessels were       all normal. Impression:               - The main pancreatic duct is normal, non-dilated.                           - 4.40mm irregularity in the body of the pancreas,                            possibly focal fatty sparing. This was too small                            for FNA but it will need surveillance in 6 months. Moderate Sedation:      N/A- Per Anesthesia Care Recommendation:           - Discharge patient to home (ambulatory).                           - Repeat EUS in 6 months to re-evaluated the body                            of  pancreas. Procedure Code(s):        --- Professional ---                           [phone], Esophagogastroduodenoscopy, flexible,                            transoral; with endoscopic ultrasound examination                            limited to the esophagus, stomach or duodenum, and                            adjacent structures Diagnosis Code(s):        --- Professional ---                           K86.89, Other specified diseases of pancreas CPT copyright 2017 American Medical Association. All rights reserved. The codes documented in this report are preliminary and upon coder review may  be revised to meet current compliance requirements. Milus Banister, MD 08/18/2017 1:57:30 PM This report has been signed electronically. Number of Addenda: 0

## 2017-08-19 ENCOUNTER — Encounter (HOSPITAL_COMMUNITY): Payer: Self-pay | Admitting: Gastroenterology

## 2017-08-19 DIAGNOSIS — E1142 Type 2 diabetes mellitus with diabetic polyneuropathy: Secondary | ICD-10-CM | POA: Diagnosis not present

## 2017-08-19 DIAGNOSIS — Z79891 Long term (current) use of opiate analgesic: Secondary | ICD-10-CM | POA: Diagnosis not present

## 2017-08-19 DIAGNOSIS — M62830 Muscle spasm of back: Secondary | ICD-10-CM | POA: Diagnosis not present

## 2017-08-19 DIAGNOSIS — G894 Chronic pain syndrome: Secondary | ICD-10-CM | POA: Diagnosis not present

## 2017-08-31 DIAGNOSIS — M25511 Pain in right shoulder: Secondary | ICD-10-CM | POA: Diagnosis not present

## 2017-10-12 DIAGNOSIS — G894 Chronic pain syndrome: Secondary | ICD-10-CM | POA: Diagnosis not present

## 2017-10-12 DIAGNOSIS — M62830 Muscle spasm of back: Secondary | ICD-10-CM | POA: Diagnosis not present

## 2017-10-12 DIAGNOSIS — E1142 Type 2 diabetes mellitus with diabetic polyneuropathy: Secondary | ICD-10-CM | POA: Diagnosis not present

## 2017-10-12 DIAGNOSIS — Z79891 Long term (current) use of opiate analgesic: Secondary | ICD-10-CM | POA: Diagnosis not present

## 2017-10-20 DIAGNOSIS — R251 Tremor, unspecified: Secondary | ICD-10-CM | POA: Diagnosis not present

## 2017-10-20 DIAGNOSIS — Z23 Encounter for immunization: Secondary | ICD-10-CM | POA: Diagnosis not present

## 2017-10-20 DIAGNOSIS — E1142 Type 2 diabetes mellitus with diabetic polyneuropathy: Secondary | ICD-10-CM | POA: Diagnosis not present

## 2017-10-20 DIAGNOSIS — Z79899 Other long term (current) drug therapy: Secondary | ICD-10-CM | POA: Diagnosis not present

## 2017-10-20 DIAGNOSIS — E1140 Type 2 diabetes mellitus with diabetic neuropathy, unspecified: Secondary | ICD-10-CM | POA: Diagnosis not present

## 2017-12-05 DIAGNOSIS — M25511 Pain in right shoulder: Secondary | ICD-10-CM | POA: Diagnosis not present

## 2017-12-07 DIAGNOSIS — G894 Chronic pain syndrome: Secondary | ICD-10-CM | POA: Diagnosis not present

## 2017-12-07 DIAGNOSIS — M62830 Muscle spasm of back: Secondary | ICD-10-CM | POA: Diagnosis not present

## 2017-12-07 DIAGNOSIS — E1142 Type 2 diabetes mellitus with diabetic polyneuropathy: Secondary | ICD-10-CM | POA: Diagnosis not present

## 2017-12-07 DIAGNOSIS — Z79891 Long term (current) use of opiate analgesic: Secondary | ICD-10-CM | POA: Diagnosis not present

## 2017-12-27 DIAGNOSIS — S99911A Unspecified injury of right ankle, initial encounter: Secondary | ICD-10-CM | POA: Diagnosis not present

## 2017-12-27 DIAGNOSIS — M25571 Pain in right ankle and joints of right foot: Secondary | ICD-10-CM | POA: Diagnosis not present

## 2017-12-27 DIAGNOSIS — X501XXA Overexertion from prolonged static or awkward postures, initial encounter: Secondary | ICD-10-CM | POA: Diagnosis not present

## 2017-12-27 DIAGNOSIS — S93401A Sprain of unspecified ligament of right ankle, initial encounter: Secondary | ICD-10-CM | POA: Diagnosis not present

## 2018-01-20 DIAGNOSIS — Z23 Encounter for immunization: Secondary | ICD-10-CM | POA: Diagnosis not present

## 2018-01-20 DIAGNOSIS — I10 Essential (primary) hypertension: Secondary | ICD-10-CM | POA: Diagnosis not present

## 2018-01-20 DIAGNOSIS — E119 Type 2 diabetes mellitus without complications: Secondary | ICD-10-CM | POA: Diagnosis not present

## 2018-01-20 DIAGNOSIS — G894 Chronic pain syndrome: Secondary | ICD-10-CM | POA: Diagnosis not present

## 2018-01-20 DIAGNOSIS — E1140 Type 2 diabetes mellitus with diabetic neuropathy, unspecified: Secondary | ICD-10-CM | POA: Diagnosis not present

## 2018-02-01 DIAGNOSIS — M62830 Muscle spasm of back: Secondary | ICD-10-CM | POA: Diagnosis not present

## 2018-02-01 DIAGNOSIS — Z79891 Long term (current) use of opiate analgesic: Secondary | ICD-10-CM | POA: Diagnosis not present

## 2018-02-01 DIAGNOSIS — E1142 Type 2 diabetes mellitus with diabetic polyneuropathy: Secondary | ICD-10-CM | POA: Diagnosis not present

## 2018-02-01 DIAGNOSIS — G894 Chronic pain syndrome: Secondary | ICD-10-CM | POA: Diagnosis not present

## 2018-02-06 DIAGNOSIS — M25511 Pain in right shoulder: Secondary | ICD-10-CM | POA: Diagnosis not present

## 2018-02-13 ENCOUNTER — Other Ambulatory Visit: Payer: Self-pay | Admitting: Gastroenterology

## 2018-02-14 ENCOUNTER — Encounter (HOSPITAL_COMMUNITY): Payer: Self-pay | Admitting: Emergency Medicine

## 2018-02-14 ENCOUNTER — Emergency Department (HOSPITAL_COMMUNITY): Payer: PPO

## 2018-02-14 ENCOUNTER — Other Ambulatory Visit: Payer: Self-pay

## 2018-02-14 ENCOUNTER — Emergency Department (HOSPITAL_COMMUNITY)
Admission: EM | Admit: 2018-02-14 | Discharge: 2018-02-14 | Disposition: A | Discharge status: Home / Self Care | Payer: PPO | Attending: Emergency Medicine | Admitting: Emergency Medicine

## 2018-02-14 DIAGNOSIS — J45909 Unspecified asthma, uncomplicated: Secondary | ICD-10-CM | POA: Insufficient documentation

## 2018-02-14 DIAGNOSIS — I10 Essential (primary) hypertension: Secondary | ICD-10-CM | POA: Insufficient documentation

## 2018-02-14 DIAGNOSIS — R1013 Epigastric pain: Secondary | ICD-10-CM | POA: Diagnosis present

## 2018-02-14 DIAGNOSIS — K859 Acute pancreatitis without necrosis or infection, unspecified: Secondary | ICD-10-CM | POA: Diagnosis not present

## 2018-02-14 DIAGNOSIS — Z79899 Other long term (current) drug therapy: Secondary | ICD-10-CM | POA: Diagnosis not present

## 2018-02-14 DIAGNOSIS — F17210 Nicotine dependence, cigarettes, uncomplicated: Secondary | ICD-10-CM | POA: Diagnosis not present

## 2018-02-14 DIAGNOSIS — N200 Calculus of kidney: Secondary | ICD-10-CM | POA: Diagnosis not present

## 2018-02-14 DIAGNOSIS — Z7984 Long term (current) use of oral hypoglycemic drugs: Secondary | ICD-10-CM | POA: Diagnosis not present

## 2018-02-14 DIAGNOSIS — K429 Umbilical hernia without obstruction or gangrene: Secondary | ICD-10-CM | POA: Diagnosis not present

## 2018-02-14 DIAGNOSIS — R000 Tachycardia, unspecified: Secondary | ICD-10-CM | POA: Diagnosis not present

## 2018-02-14 LAB — CBC
HCT: 46 % (ref 39.0–52.0)
HEMOGLOBIN: 15.1 g/dL (ref 13.0–17.0)
MCH: 29.8 pg (ref 26.0–34.0)
MCHC: 32.8 g/dL (ref 30.0–36.0)
MCV: 90.9 fL (ref 80.0–100.0)
PLATELETS: 287 10*3/uL (ref 150–400)
RBC: 5.06 MIL/uL (ref 4.22–5.81)
RDW: 12.7 % (ref 11.5–15.5)
WBC: 11.8 10*3/uL — ABNORMAL HIGH (ref 4.0–10.5)
nRBC: 0 % (ref 0.0–0.2)

## 2018-02-14 LAB — COMPREHENSIVE METABOLIC PANEL
ALK PHOS: 65 U/L (ref 38–126)
ALT: 14 U/L (ref 0–44)
AST: 12 U/L — ABNORMAL LOW (ref 15–41)
Albumin: 4.4 g/dL (ref 3.5–5.0)
Anion gap: 10 (ref 5–15)
BILIRUBIN TOTAL: 0.5 mg/dL (ref 0.3–1.2)
BUN: 13 mg/dL (ref 6–20)
CO2: 24 mmol/L (ref 22–32)
CREATININE: 0.91 mg/dL (ref 0.61–1.24)
Calcium: 10.2 mg/dL (ref 8.9–10.3)
Chloride: 100 mmol/L (ref 98–111)
GFR calc Af Amer: >60 mL/min (ref >60)
GFR calc non Af Amer: >60 mL/min (ref >60)
GLUCOSE: 263 mg/dL — AB (ref 70–99)
Potassium: 4.2 mmol/L (ref 3.5–5.1)
Sodium: 134 mmol/L — ABNORMAL LOW (ref 135–145)
TOTAL PROTEIN: 7.7 g/dL (ref 6.5–8.1)

## 2018-02-14 LAB — URINALYSIS, ROUTINE W REFLEX MICROSCOPIC
BILIRUBIN URINE: NEGATIVE
Bacteria, UA: NONE SEEN
Glucose, UA: >=500 mg/dL — AB
Hgb urine dipstick: NEGATIVE
Ketones, ur: 5 mg/dL — AB
Leukocytes, UA: NEGATIVE
Nitrite: NEGATIVE
PH: 7 (ref 5.0–8.0)
Protein, ur: NEGATIVE mg/dL
SPECIFIC GRAVITY, URINE: 1.012 (ref 1.005–1.030)

## 2018-02-14 LAB — LIPASE, BLOOD: Lipase: 42 U/L (ref 11–51)

## 2018-02-14 MED ORDER — IOHEXOL 300 MG/ML  SOLN
100.0000 mL | Freq: Once | INTRAMUSCULAR | Status: AC | PRN
  Administered 2018-02-14: 100 mL via INTRAVENOUS

## 2018-02-14 MED ORDER — HYDROMORPHONE HCL 1 MG/ML IJ SOLN
1.0000 mg | Freq: Once | INTRAMUSCULAR | Status: AC
  Administered 2018-02-14: 1 mg via INTRAVENOUS
  Filled 2018-02-14: qty 1

## 2018-02-14 MED ORDER — MORPHINE SULFATE (PF) 4 MG/ML IV SOLN
4.0000 mg | Freq: Once | INTRAVENOUS | Status: AC
  Administered 2018-02-14: 4 mg via INTRAVENOUS
  Filled 2018-02-14: qty 1

## 2018-02-14 MED ORDER — ONDANSETRON HCL 4 MG/2ML IJ SOLN
4.0000 mg | Freq: Once | INTRAMUSCULAR | Status: AC
Start: 2018-02-14 — End: 2018-02-14
  Administered 2018-02-14: 4 mg via INTRAVENOUS
  Filled 2018-02-14: qty 2

## 2018-02-14 NOTE — ED Notes (Signed)
Pt to CT at this time.

## 2018-02-14 NOTE — ED Triage Notes (Signed)
Patient complaining of epigastric pain radiating into middle back x 5 days. Denies vomiting or diarrhea.

## 2018-02-14 NOTE — ED Provider Notes (Signed)
[facility] EMERGENCY DEPARTMENT Provider Note   CSN: [id number] Arrival date & time: 02/14/18  1233     History   Chief Complaint Chief Complaint  Patient presents with  . Abdominal Pain    HPI Wayne Pratt is a 58 y.o. male.  Patient is a 58 year old male with past medical history of hypertension, diabetes, renal calculi.  He presents today for evaluation of epigastric pain.  This has been ongoing for the past 5 days.  His pain is constant and worsening.  He denies nausea or vomiting.  He denies any fevers or chills.  His pain is worse with palpation and changing position.  The history is provided by the patient.  Abdominal Pain  Pain location:  Epigastric Pain quality: sharp and stabbing   Pain radiates to:  Back Pain severity:  Moderate Onset quality:  Gradual Duration:  5 days Timing:  Constant Progression:  Worsening Chronicity:  New Relieved by:  Nothing Worsened by:  Movement and palpation Ineffective treatments:  None tried   Past Medical History:  Diagnosis Date  . Anemia    takes Iron pill daily  . Anxiety    takes Xanax daily  . Arthritis   . Diabetes mellitus ([place])    takes Metformin daily  . History of bronchitis    mid 90's  . History of hypertension    lost weight and been off meds over a yr  . History of kidney stones   . History of pleural empyema   . Hypercholesterolemia    lost weight and been off meds over a yr  . Hypertension   . Insomnia    takes Trazodone nightly  . Kidney stones    22 times  . Neuropathy   . Pneumonia    hx of-15+ yrs ago  . Sleep apnea    Stop Bang score of 7  . Weakness    numbness and tingling    Patient Active Problem List   Diagnosis Date Noted  . Abdominal pain, epigastric 06/20/2017  . Dilation of pancreatic duct 06/20/2017  . S/P insertion of spinal cord stimulator 08/01/2015  . Rectal bleeding 07/17/2014  . Anemia 07/17/2014  . Fatty liver 09/07/2011  . Diabetes mellitus ([place]) 09/07/2011  .  Encounter for screening colonoscopy 09/07/2011  . Chronic pain 09/07/2011    Past Surgical History:  Procedure Laterality Date  . BIOPSY  10/05/2011   Dr. Oneida Alar :non-erosive gastritis  . BIOPSY  08/06/2014   Procedure: BIOPSY;  Surgeon: Danie Binder, MD;  Location: [facility];  Service: Endoscopy;;  . CERVICAL DISC SURGERY    . ELBOW SURGERY Right   . ESOPHAGOGASTRODUODENOSCOPY  Sept 2013   Dr. Oneida Alar: chronic inactive gastritis   . ESOPHAGOGASTRODUODENOSCOPY N/A 08/18/2017   Procedure: ESOPHAGOGASTRODUODENOSCOPY (EGD);  Surgeon: Milus Banister, MD;  Location: [facility];  Service: Endoscopy;  Laterality: N/A;  . ESOPHAGOGASTRODUODENOSCOPY (EGD) WITH PROPOFOL N/A 08/06/2014   mild non-erosive gastritis, duodenitis  . EUS N/A 08/18/2017   Procedure: UPPER ENDOSCOPIC ULTRASOUND (EUS) RADIAL;  Surgeon: Milus Banister, MD;  Location: [facility];  Service: Endoscopy;  Laterality: N/A;  . FLEXIBLE SIGMOIDOSCOPY  10/05/2011   Dr. Fields:poor prep  . LUNG SURGERY  2010   for empyema  . SPINAL CORD STIMULATOR INSERTION N/A 08/01/2015   Procedure: Spinal Cord Stimulator Implant;  Surgeon: Eustace Moore, MD;  Location: [place] [facility];  Service: Neurosurgery;  Laterality: N/A;  . TONSILLECTOMY    . VASECTOMY Bilateral 1995  Home Medications    Prior to Admission medications   Medication Sig Start Date End Date Taking? Authorizing Provider  clonazePAM (KLONOPIN) 0.5 MG tablet Take 0.5 mg by mouth 2 (two) times daily.  06/17/17   [provider]  cyclobenzaprine (FLEXERIL) 10 MG tablet Take 10 mg by mouth 3 (three) times daily as needed for muscle spasms.  04/14/17   [provider]  GLIPIZIDE XL 5 MG 24 hr tablet Take 5 mg by mouth daily.  04/01/17   [provider]  lidocaine-prilocaine (EMLA) cream Apply 1 application topically 3 (three) times daily.    [provider]  lisinopril (PRINIVIL,ZESTRIL) 10 MG tablet Take 10 mg by mouth daily.  04/19/17    [provider]  lovastatin (MEVACOR) 40 MG tablet Take 40 mg by mouth at bedtime.  04/01/17   [provider]  metFORMIN (GLUCOPHAGE) 1000 MG tablet Take 1,000 mg by mouth 2 (two) times daily.  04/01/17   [provider]  naproxen sodium (ALEVE) 220 MG tablet Take 440 mg by mouth daily as needed (for pain or headache).    [provider]  Oxycodone HCl 10 MG TABS Take 10 mg by mouth every 4 (four) hours.     [provider]  pantoprazole (PROTONIX) 40 MG tablet Take 1 tablet (40 mg total) by mouth daily. 30 minutes before breakfast 06/20/17   Annitta Needs, NP    Family History Family History  Problem Relation Age of Onset  . Prostate cancer Other        all male members on mother's side  . Colon cancer Neg Hx     Social History Social History   Tobacco Use  . Smoking status: Current Every Day Smoker    Packs/day: 1.00    Years: 39.00    Pack years: 39.00    Types: Cigarettes  . Smokeless tobacco: Never Used  Substance Use Topics  . Alcohol use: No    Alcohol/week: 0.0 standard drinks  . Drug use: No     Allergies   Penicillins and Ativan [lorazepam]   Review of Systems Review of Systems  Gastrointestinal: Positive for abdominal pain.  All other systems reviewed and are negative.    Physical Exam Updated Vital Signs BP 128/88   Pulse (!) 109   Temp 98.1 F (36.7 C) (Oral)   Resp 12   Ht 6\' 1"  (1.854 m)   Wt 108.9 kg   SpO2 96%   BMI 31.66 kg/m   Physical Exam Vitals signs and nursing note reviewed.  Constitutional:      General: He is not in acute distress.    Appearance: He is well-developed. He is not diaphoretic.  HENT:     Head: Normocephalic and atraumatic.  Neck:     Musculoskeletal: Normal range of motion and neck supple.  Cardiovascular:     Rate and Rhythm: Normal rate and regular rhythm.     Heart sounds: No murmur. No friction rub.  Pulmonary:     Effort: Pulmonary effort is normal. No  respiratory distress.     Breath sounds: Normal breath sounds. No wheezing or rales.  Abdominal:     General: Bowel sounds are normal. There is no distension.     Palpations: Abdomen is soft.     Tenderness: There is abdominal tenderness in the epigastric area. There is no right CVA tenderness, left CVA tenderness, guarding or rebound.  Musculoskeletal: Normal range of motion.  Skin:  General: Skin is warm and dry.  Neurological:     Mental Status: He is alert and oriented to person, place, and time.     Coordination: Coordination normal.      ED Treatments / Results  Labs (all labs ordered are listed, but only abnormal results are displayed) Labs Reviewed  COMPREHENSIVE METABOLIC PANEL - Abnormal; Notable for the following components:      Result Value   Sodium 134 (*)    Glucose, Bld 263 (*)    AST 12 (*)    All other components within normal limits  CBC - Abnormal; Notable for the following components:   WBC 11.8 (*)    All other components within normal limits  LIPASE, BLOOD  URINALYSIS, ROUTINE W REFLEX MICROSCOPIC    EKG EKG Interpretation  Date/Time:  Tuesday February 14 2018 13:28:58 EST Ventricular Rate:  109 PR Interval:    QRS Duration: 87 QT Interval:  321 QTC Calculation: 433 R Axis:   46 Text Interpretation:  Sinus tachycardia Probable anteroseptal infarct, old Confirmed by Veryl Speak [phone]) on 02/14/2018 1:39:34 PM   Radiology No results found.  Procedures Procedures (including critical care time)  Medications Ordered in ED Medications  morphine 4 MG/ML injection 4 mg (has no administration in time range)  ondansetron (ZOFRAN) injection 4 mg (has no administration in time range)     Initial Impression / Assessment and Plan / ED Course  I have reviewed the triage vital signs and the nursing notes.  Pertinent labs & imaging results that were available during my care of the patient were reviewed by me and considered in my medical decision  making (see chart for details).  Patient presenting here with complaints of epigastric abdominal pain that appears related to pancreatitis.  His lipase is normal, however there is inflammation on the CT scan.  He is feeling better after medications given in the ER.  At this point, the patient is adamant about going home.  He was advised to adhere to a clear liquid diet for the next 2 days, then slowly reintroduce diet as tolerated.  He is to return as needed if symptoms worsen.  Final Clinical Impressions(s) / ED Diagnoses   Final diagnoses:  None    ED Discharge Orders    None       Veryl Speak, MD 02/14/18 1925

## 2018-02-14 NOTE — Discharge Instructions (Addendum)
Continue taking your Percocet as prescribed as needed for pain.  Clear liquid diet for the next 2 days, then slowly advance as tolerated.  Return to the emergency department for worsening pain, high fever, bloody stool, or other new and concerning symptoms.

## 2018-02-16 DIAGNOSIS — I10 Essential (primary) hypertension: Secondary | ICD-10-CM | POA: Diagnosis not present

## 2018-02-16 DIAGNOSIS — Z79899 Other long term (current) drug therapy: Secondary | ICD-10-CM | POA: Diagnosis not present

## 2018-02-16 DIAGNOSIS — Z01818 Encounter for other preprocedural examination: Secondary | ICD-10-CM | POA: Diagnosis not present

## 2018-02-16 DIAGNOSIS — K859 Acute pancreatitis without necrosis or infection, unspecified: Secondary | ICD-10-CM | POA: Diagnosis not present

## 2018-02-16 DIAGNOSIS — R8290 Unspecified abnormal findings in urine: Secondary | ICD-10-CM | POA: Diagnosis not present

## 2018-02-17 ENCOUNTER — Encounter (HOSPITAL_COMMUNITY): Payer: Self-pay | Admitting: Emergency Medicine

## 2018-02-17 ENCOUNTER — Emergency Department (HOSPITAL_COMMUNITY)
Admission: EM | Admit: 2018-02-17 | Discharge: 2018-02-17 | Disposition: A | Discharge status: Home / Self Care | Payer: PPO | Attending: Emergency Medicine | Admitting: Emergency Medicine

## 2018-02-17 ENCOUNTER — Other Ambulatory Visit: Payer: Self-pay

## 2018-02-17 DIAGNOSIS — Z79899 Other long term (current) drug therapy: Secondary | ICD-10-CM | POA: Diagnosis not present

## 2018-02-17 DIAGNOSIS — K8500 Idiopathic acute pancreatitis without necrosis or infection: Secondary | ICD-10-CM | POA: Diagnosis not present

## 2018-02-17 DIAGNOSIS — E119 Type 2 diabetes mellitus without complications: Secondary | ICD-10-CM | POA: Insufficient documentation

## 2018-02-17 DIAGNOSIS — I10 Essential (primary) hypertension: Secondary | ICD-10-CM | POA: Diagnosis not present

## 2018-02-17 DIAGNOSIS — F17210 Nicotine dependence, cigarettes, uncomplicated: Secondary | ICD-10-CM | POA: Insufficient documentation

## 2018-02-17 DIAGNOSIS — R1013 Epigastric pain: Secondary | ICD-10-CM | POA: Diagnosis present

## 2018-02-17 DIAGNOSIS — Z7984 Long term (current) use of oral hypoglycemic drugs: Secondary | ICD-10-CM | POA: Diagnosis not present

## 2018-02-17 LAB — COMPREHENSIVE METABOLIC PANEL
ALT: 13 U/L (ref 0–44)
AST: 12 U/L — ABNORMAL LOW (ref 15–41)
Albumin: 4.4 g/dL (ref 3.5–5.0)
Alkaline Phosphatase: 60 U/L (ref 38–126)
Anion gap: 11 (ref 5–15)
BUN: 20 mg/dL (ref 6–20)
CHLORIDE: 97 mmol/L — AB (ref 98–111)
CO2: 24 mmol/L (ref 22–32)
Calcium: 9.5 mg/dL (ref 8.9–10.3)
Creatinine, Ser: 1.12 mg/dL (ref 0.61–1.24)
GFR calc Af Amer: >60 mL/min (ref >60)
GFR calc non Af Amer: >60 mL/min (ref >60)
Glucose, Bld: 225 mg/dL — ABNORMAL HIGH (ref 70–99)
POTASSIUM: 3.9 mmol/L (ref 3.5–5.1)
SODIUM: 132 mmol/L — AB (ref 135–145)
Total Bilirubin: 0.5 mg/dL (ref 0.3–1.2)
Total Protein: 7.4 g/dL (ref 6.5–8.1)

## 2018-02-17 LAB — CBC WITH DIFFERENTIAL/PLATELET
Abs Immature Granulocytes: 0.05 10*3/uL (ref 0.00–0.07)
Basophils Absolute: 0.1 10*3/uL (ref 0.0–0.1)
Basophils Relative: 0 %
Eosinophils Absolute: 0.2 10*3/uL (ref 0.0–0.5)
Eosinophils Relative: 2 %
HCT: 46.2 % (ref 39.0–52.0)
Hemoglobin: 15.2 g/dL (ref 13.0–17.0)
Immature Granulocytes: 0 %
Lymphocytes Relative: 22 %
Lymphs Abs: 2.7 10*3/uL (ref 0.7–4.0)
MCH: 30.7 pg (ref 26.0–34.0)
MCHC: 32.9 g/dL (ref 30.0–36.0)
MCV: 93.3 fL (ref 80.0–100.0)
Monocytes Absolute: 0.9 10*3/uL (ref 0.1–1.0)
Monocytes Relative: 7 %
Neutro Abs: 8.2 10*3/uL — ABNORMAL HIGH (ref 1.7–7.7)
Neutrophils Relative %: 69 %
Platelets: 306 10*3/uL (ref 150–400)
RBC: 4.95 MIL/uL (ref 4.22–5.81)
RDW: 12.4 % (ref 11.5–15.5)
WBC: 12.1 10*3/uL — AB (ref 4.0–10.5)
nRBC: 0 % (ref 0.0–0.2)

## 2018-02-17 LAB — LIPASE, BLOOD: Lipase: 32 U/L (ref 11–51)

## 2018-02-17 MED ORDER — HYDROMORPHONE HCL 4 MG PO TABS: 4.0000 mg | ORAL_TABLET | Freq: Four times a day (QID) | ORAL | 0 refills | Status: DC | PRN

## 2018-02-17 MED ORDER — SODIUM CHLORIDE 0.9 % IV BOLUS
1000.0000 mL | Freq: Once | INTRAVENOUS | Status: AC
  Administered 2018-02-17: 1000 mL via INTRAVENOUS

## 2018-02-17 MED ORDER — ONDANSETRON 4 MG PO TBDP: ORAL_TABLET | ORAL | 0 refills | Status: DC

## 2018-02-17 MED ORDER — HYDROMORPHONE HCL 1 MG/ML IJ SOLN
1.0000 mg | Freq: Once | INTRAMUSCULAR | Status: AC
  Administered 2018-02-17: 1 mg via INTRAVENOUS
  Filled 2018-02-17: qty 1

## 2018-02-17 MED ORDER — HYDROMORPHONE HCL 1 MG/ML IJ SOLN
0.5000 mg | Freq: Once | INTRAMUSCULAR | Status: AC
  Administered 2018-02-17: 0.5 mg via INTRAVENOUS
  Filled 2018-02-17: qty 1

## 2018-02-17 MED ORDER — ONDANSETRON HCL 4 MG/2ML IJ SOLN
4.0000 mg | Freq: Once | INTRAMUSCULAR | Status: AC
  Administered 2018-02-17: 4 mg via INTRAVENOUS
  Filled 2018-02-17: qty 2

## 2018-02-17 NOTE — ED Provider Notes (Signed)
[facility] EMERGENCY DEPARTMENT Provider Note   CSN: [id number] Arrival date & time: 02/17/18  1537     History   Chief Complaint Chief Complaint  Patient presents with  . Pancreatitis    HPI Wayne Pratt is a 58 y.o. male.  Patient complains of abdominal pain.  He was diagnosed with pancreatitis a few days ago and has been taking Percocets but his pain has continued.  He was told to return to the emergency department.  He has not been vomiting  The history is provided by the patient.  Abdominal Pain  Pain location:  Epigastric Pain quality: aching   Pain radiates to:  Does not radiate Pain severity:  Severe Onset quality:  Gradual Timing:  Constant Progression:  Worsening Chronicity:  New Context: not alcohol use   Relieved by:  Nothing Worsened by:  Nothing Ineffective treatments: Oxycodone. Associated symptoms: no anorexia, no chest pain, no cough, no diarrhea, no fatigue and no hematuria     Past Medical History:  Diagnosis Date  . Anemia    takes Iron pill daily  . Anxiety    takes Xanax daily  . Arthritis   . Diabetes mellitus ([place])    takes Metformin daily  . History of bronchitis    mid 90's  . History of hypertension    lost weight and been off meds over a yr  . History of kidney stones   . History of pleural empyema   . Hypercholesterolemia    lost weight and been off meds over a yr  . Hypertension   . Insomnia    takes Trazodone nightly  . Kidney stones    22 times  . Neuropathy   . Pneumonia    hx of-15+ yrs ago  . Sleep apnea    Stop Bang score of 7  . Weakness    numbness and tingling    Patient Active Problem List   Diagnosis Date Noted  . Abdominal pain, epigastric 06/20/2017  . Dilation of pancreatic duct 06/20/2017  . S/P insertion of spinal cord stimulator 08/01/2015  . Rectal bleeding 07/17/2014  . Anemia 07/17/2014  . Fatty liver 09/07/2011  . Diabetes mellitus ([place]) 09/07/2011  . Encounter for screening colonoscopy  09/07/2011  . Chronic pain 09/07/2011    Past Surgical History:  Procedure Laterality Date  . BIOPSY  10/05/2011   Dr. Oneida Alar :non-erosive gastritis  . BIOPSY  08/06/2014   Procedure: BIOPSY;  Surgeon: Danie Binder, MD;  Location: [facility];  Service: Endoscopy;;  . CERVICAL DISC SURGERY    . ELBOW SURGERY Right   . ESOPHAGOGASTRODUODENOSCOPY  Sept 2013   Dr. Oneida Alar: chronic inactive gastritis   . ESOPHAGOGASTRODUODENOSCOPY N/A 08/18/2017   Procedure: ESOPHAGOGASTRODUODENOSCOPY (EGD);  Surgeon: Milus Banister, MD;  Location: [facility];  Service: Endoscopy;  Laterality: N/A;  . ESOPHAGOGASTRODUODENOSCOPY (EGD) WITH PROPOFOL N/A 08/06/2014   mild non-erosive gastritis, duodenitis  . EUS N/A 08/18/2017   Procedure: UPPER ENDOSCOPIC ULTRASOUND (EUS) RADIAL;  Surgeon: Milus Banister, MD;  Location: [facility];  Service: Endoscopy;  Laterality: N/A;  . FLEXIBLE SIGMOIDOSCOPY  10/05/2011   Dr. Fields:poor prep  . LUNG SURGERY  2010   for empyema  . SPINAL CORD STIMULATOR INSERTION N/A 08/01/2015   Procedure: Spinal Cord Stimulator Implant;  Surgeon: Eustace Moore, MD;  Location: [place] [facility];  Service: Neurosurgery;  Laterality: N/A;  . TONSILLECTOMY    . VASECTOMY Bilateral 1995  Home Medications    Prior to Admission medications   Medication Sig Start Date End Date Taking? Authorizing Provider  clonazePAM (KLONOPIN) 0.5 MG tablet Take 0.5 mg by mouth 2 (two) times daily.  06/17/17   [provider]  cyclobenzaprine (FLEXERIL) 10 MG tablet Take 10 mg by mouth 3 (three) times daily as needed for muscle spasms.  04/14/17   [provider]  GLIPIZIDE XL 5 MG 24 hr tablet Take 5 mg by mouth daily.  04/01/17   [provider]  HYDROmorphone (DILAUDID) 4 MG tablet Take 1 tablet (4 mg total) by mouth every 6 (six) hours as needed for severe pain. 02/17/18   Milton Ferguson, MD  IBU 800 MG tablet Take 800 mg by mouth daily as needed for headache.  01/20/18    [provider]  lisinopril (PRINIVIL,ZESTRIL) 10 MG tablet Take 5 mg by mouth every morning.  04/19/17   [provider]  lovastatin (MEVACOR) 40 MG tablet Take 40 mg by mouth at bedtime.  04/01/17   [provider]  metFORMIN (GLUCOPHAGE) 1000 MG tablet Take 1,000 mg by mouth 2 (two) times daily.  04/01/17   [provider]  naproxen sodium (ALEVE) 220 MG tablet Take 440 mg by mouth daily as needed (for pain or headache).    [provider]  ondansetron (ZOFRAN ODT) 4 MG disintegrating tablet 4mg  ODT q4 hours prn nausea/vomit 02/17/18   Milton Ferguson, MD  Oxycodone HCl 10 MG TABS Take 10 mg by mouth every 4 (four) hours.     [provider]  pantoprazole (PROTONIX) 40 MG tablet TAKE 1 TABLET BY MOUTH 30 MINUTES PRIOR TO BREAKFAST. 02/15/18   Annitta Needs, NP    Family History Family History  Problem Relation Age of Onset  . Prostate cancer Other        all male members on mother's side  . Colon cancer Neg Hx     Social History Social History   Tobacco Use  . Smoking status: Current Every Day Smoker    Packs/day: 1.00    Years: 39.00    Pack years: 39.00    Types: Cigarettes  . Smokeless tobacco: Never Used  Substance Use Topics  . Alcohol use: No    Alcohol/week: 0.0 standard drinks  . Drug use: No     Allergies   Penicillins and Ativan [lorazepam]   Review of Systems Review of Systems  Constitutional: Negative for appetite change and fatigue.  HENT: Negative for congestion, ear discharge and sinus pressure.   Eyes: Negative for discharge.  Respiratory: Negative for cough.   Cardiovascular: Negative for chest pain.  Gastrointestinal: Positive for abdominal pain. Negative for anorexia and diarrhea.  Genitourinary: Negative for frequency and hematuria.  Musculoskeletal: Negative for back pain.  Skin: Negative for rash.  Neurological: Negative for seizures and headaches.  Psychiatric/Behavioral: Negative for  hallucinations.     Physical Exam Updated Vital Signs BP 136/89   Pulse 89   Temp 98.3 F (36.8 C)   Resp 16   Ht 6\' 1"  (1.854 m)   Wt 106.1 kg   SpO2 95%   BMI 30.87 kg/m   Physical Exam Vitals signs and nursing note reviewed.  Constitutional:      Appearance: He is well-developed.  HENT:     Head: Normocephalic.     Nose: Nose normal.  Eyes:     General: No scleral icterus.    Conjunctiva/sclera: Conjunctivae normal.  Neck:  Musculoskeletal: Neck supple.     Thyroid: No thyromegaly.  Cardiovascular:     Rate and Rhythm: Normal rate and regular rhythm.     Heart sounds: No murmur. No friction rub. No gallop.   Pulmonary:     Breath sounds: No stridor. No wheezing or rales.  Chest:     Chest wall: No tenderness.  Abdominal:     General: There is no distension.     Tenderness: There is abdominal tenderness. There is no rebound.  Musculoskeletal: Normal range of motion.  Lymphadenopathy:     Cervical: No cervical adenopathy.  Skin:    Findings: No erythema or rash.  Neurological:     Mental Status: He is oriented to person, place, and time.     Motor: No abnormal muscle tone.     Coordination: Coordination normal.  Psychiatric:        Behavior: Behavior normal.      ED Treatments / Results  Labs (all labs ordered are listed, but only abnormal results are displayed) Labs Reviewed  COMPREHENSIVE METABOLIC PANEL - Abnormal; Notable for the following components:      Result Value   Sodium 132 (*)    Chloride 97 (*)    Glucose, Bld 225 (*)    AST 12 (*)    All other components within normal limits  CBC WITH DIFFERENTIAL/PLATELET - Abnormal; Notable for the following components:   WBC 12.1 (*)    Neutro Abs 8.2 (*)    All other components within normal limits  LIPASE, BLOOD    EKG None  Radiology No results found.  Procedures Procedures (including critical care time)  Medications Ordered in ED Medications  sodium chloride 0.9 % bolus  1,000 mL (0 mLs Intravenous Stopped 02/17/18 2138)  HYDROmorphone (DILAUDID) injection 1 mg (1 mg Intravenous Given 02/17/18 2046)  ondansetron (ZOFRAN) injection 4 mg (4 mg Intravenous Given 02/17/18 2046)  HYDROmorphone (DILAUDID) injection 0.5 mg (0.5 mg Intravenous Given 02/17/18 2138)     Initial Impression / Assessment and Plan / ED Course  I have reviewed the triage vital signs and the nursing notes.  Pertinent labs & imaging results that were available during my care of the patient were reviewed by me and considered in my medical decision making (see chart for details). Labs show mildly elevated glucose and mildly elevated white count but normal lipase.  Patient had a CT scan few days ago that showed mild pancreatitis.  I suspect his discomfort is from his pancreatitis.  He was given Dilaudid here in the emergency department and felt much better.  He will be sent home with Dilaudid and Zofran and he has an appointment with Dr. Oneida Alar the gastroenterologist next week.  He will return if any problems       Final Clinical Impressions(s) / ED Diagnoses   Final diagnoses:  Idiopathic acute pancreatitis, unspecified complication status    ED Discharge Orders         Ordered    HYDROmorphone (DILAUDID) 4 MG tablet  Every 6 hours PRN     02/17/18 2158    ondansetron (ZOFRAN ODT) 4 MG disintegrating tablet     02/17/18 2158           Milton Ferguson, MD 02/17/18 2203

## 2018-02-17 NOTE — ED Triage Notes (Signed)
Patient reports increasing pain with pancreatitis. Told to return to ED for worsening symptoms.

## 2018-02-17 NOTE — Discharge Instructions (Addendum)
Follow-up with Dr. Oneida Alar as planned

## 2018-02-21 ENCOUNTER — Telehealth: Payer: Self-pay

## 2018-02-21 NOTE — Telephone Encounter (Signed)
Per recall pt needs to have upper EUS r/l mac 1st available.

## 2018-02-23 NOTE — Telephone Encounter (Signed)
No answer and no voice mail. Will mail letter

## 2018-02-24 ENCOUNTER — Other Ambulatory Visit: Payer: Self-pay

## 2018-02-24 DIAGNOSIS — I10 Essential (primary) hypertension: Secondary | ICD-10-CM | POA: Diagnosis not present

## 2018-02-24 DIAGNOSIS — K8689 Other specified diseases of pancreas: Secondary | ICD-10-CM

## 2018-02-24 DIAGNOSIS — E119 Type 2 diabetes mellitus without complications: Secondary | ICD-10-CM | POA: Diagnosis not present

## 2018-03-09 ENCOUNTER — Ambulatory Visit (HOSPITAL_COMMUNITY): Payer: PPO | Admitting: Anesthesiology

## 2018-03-09 ENCOUNTER — Encounter (HOSPITAL_COMMUNITY): Payer: Self-pay | Admitting: *Deleted

## 2018-03-09 ENCOUNTER — Ambulatory Visit (HOSPITAL_COMMUNITY)
Admission: RE | Admit: 2018-03-09 | Discharge: 2018-03-09 | Disposition: A | Discharge status: Home / Self Care | Payer: PPO | Attending: Gastroenterology | Admitting: Gastroenterology

## 2018-03-09 ENCOUNTER — Other Ambulatory Visit: Payer: Self-pay

## 2018-03-09 ENCOUNTER — Encounter (HOSPITAL_COMMUNITY)
Admission: RE | Disposition: A | Discharge status: Home / Self Care | Payer: Self-pay | Source: Home / Self Care | Attending: Gastroenterology

## 2018-03-09 DIAGNOSIS — R933 Abnormal findings on diagnostic imaging of other parts of digestive tract: Secondary | ICD-10-CM | POA: Diagnosis not present

## 2018-03-09 DIAGNOSIS — E1140 Type 2 diabetes mellitus with diabetic neuropathy, unspecified: Secondary | ICD-10-CM | POA: Diagnosis not present

## 2018-03-09 DIAGNOSIS — G4730 Sleep apnea, unspecified: Secondary | ICD-10-CM | POA: Diagnosis not present

## 2018-03-09 DIAGNOSIS — K8689 Other specified diseases of pancreas: Secondary | ICD-10-CM | POA: Diagnosis not present

## 2018-03-09 DIAGNOSIS — D649 Anemia, unspecified: Secondary | ICD-10-CM | POA: Diagnosis not present

## 2018-03-09 DIAGNOSIS — F17210 Nicotine dependence, cigarettes, uncomplicated: Secondary | ICD-10-CM | POA: Diagnosis not present

## 2018-03-09 DIAGNOSIS — G4700 Insomnia, unspecified: Secondary | ICD-10-CM | POA: Diagnosis not present

## 2018-03-09 DIAGNOSIS — F419 Anxiety disorder, unspecified: Secondary | ICD-10-CM | POA: Diagnosis not present

## 2018-03-09 DIAGNOSIS — I10 Essential (primary) hypertension: Secondary | ICD-10-CM | POA: Insufficient documentation

## 2018-03-09 DIAGNOSIS — Z7984 Long term (current) use of oral hypoglycemic drugs: Secondary | ICD-10-CM | POA: Insufficient documentation

## 2018-03-09 DIAGNOSIS — E119 Type 2 diabetes mellitus without complications: Secondary | ICD-10-CM | POA: Diagnosis not present

## 2018-03-09 HISTORY — PX: FINE NEEDLE ASPIRATION: SHX6590

## 2018-03-09 HISTORY — PX: EUS: SHX5427

## 2018-03-09 HISTORY — PX: ESOPHAGOGASTRODUODENOSCOPY: SHX5428

## 2018-03-09 LAB — GLUCOSE, CAPILLARY: Glucose-Capillary: 247 mg/dL — ABNORMAL HIGH (ref 70–99)

## 2018-03-09 SURGERY — UPPER ENDOSCOPIC ULTRASOUND (EUS) RADIAL
Anesthesia: Monitor Anesthesia Care

## 2018-03-09 MED ORDER — PROPOFOL 10 MG/ML IV BOLUS
INTRAVENOUS | Status: DC | PRN
  Administered 2018-03-09 (×6): 20 mg via INTRAVENOUS
  Administered 2018-03-09: 30 mg via INTRAVENOUS
  Administered 2018-03-09 (×4): 20 mg via INTRAVENOUS
  Administered 2018-03-09: 30 mg via INTRAVENOUS
  Administered 2018-03-09 (×7): 20 mg via INTRAVENOUS

## 2018-03-09 MED ORDER — LIDOCAINE 2% (20 MG/ML) 5 ML SYRINGE
INTRAMUSCULAR | Status: DC | PRN
  Administered 2018-03-09: 40 mg via INTRAVENOUS

## 2018-03-09 MED ORDER — SODIUM CHLORIDE 0.9 % IV SOLN: INTRAVENOUS | Status: DC

## 2018-03-09 MED ORDER — PROPOFOL 10 MG/ML IV BOLUS
INTRAVENOUS | Status: AC
  Filled 2018-03-09: qty 40

## 2018-03-09 MED ORDER — PROPOFOL 10 MG/ML IV BOLUS
INTRAVENOUS | Status: AC
  Filled 2018-03-09: qty 20

## 2018-03-09 MED ORDER — LACTATED RINGERS IV SOLN
INTRAVENOUS | Status: DC
  Administered 2018-03-09: 1000 mL via INTRAVENOUS

## 2018-03-09 NOTE — Anesthesia Postprocedure Evaluation (Signed)
Anesthesia Post Note  Patient: Wayne Pratt  Procedure(s) Performed: UPPER ENDOSCOPIC ULTRASOUND (EUS) RADIAL (N/A ) FINE NEEDLE ASPIRATION     Patient location during evaluation: PACU Anesthesia Type: MAC Level of consciousness: awake and alert Pain management: pain level controlled Vital Signs Assessment: post-procedure vital signs reviewed and stable Respiratory status: spontaneous breathing, nonlabored ventilation, respiratory function stable and patient connected to nasal cannula oxygen Cardiovascular status: stable and blood pressure returned to baseline Postop Assessment: no apparent nausea or vomiting Anesthetic complications: no    Last Vitals:  Vitals:   03/09/18 1020 03/09/18 1030  BP: (!) 147/90 (!) 160/96  Pulse: 71 70  Resp: 18 12  Temp:    SpO2: 100% 98%    Last Pain:  Vitals:   03/09/18 1030  TempSrc:   PainSc: 0-No pain                 Tiajuana Amass

## 2018-03-09 NOTE — Anesthesia Procedure Notes (Signed)
Date/Time: 03/09/2018 9:28 AM Performed by: Talbot Grumbling, CRNA Oxygen Delivery Method: Nasal cannula

## 2018-03-09 NOTE — Transfer of Care (Signed)
Immediate Anesthesia Transfer of Care Note  Patient: Wayne Pratt  Procedure(s) Performed: UPPER ENDOSCOPIC ULTRASOUND (EUS) RADIAL (N/A ) FINE NEEDLE ASPIRATION  Patient Location: PACU  Anesthesia Type:MAC  Level of Consciousness: sedated  Airway & Oxygen Therapy: Patient Spontanous Breathing and Patient connected to nasal cannula oxygen  Post-op Assessment: Report given to RN and Post -op Vital signs reviewed and stable  Post vital signs: Reviewed and stable  Last Vitals:  Vitals Value Taken Time  BP    Temp    Pulse    Resp    SpO2      Last Pain:  Vitals:   03/09/18 0823  TempSrc: Oral  PainSc: 0-No pain         Complications: No apparent anesthesia complications

## 2018-03-09 NOTE — Anesthesia Preprocedure Evaluation (Signed)
Anesthesia Evaluation  Patient identified by MRN, date of birth, ID band Patient awake    Reviewed: Allergy & Precautions, NPO status , Patient's Chart, lab work & pertinent test results  Airway Mallampati: I  TM Distance: >3 FB Neck ROM: Full    Dental  (+) Edentulous Upper, Edentulous Lower, Lower Dentures, Upper Dentures   Pulmonary sleep apnea , Current Smoker,    breath sounds clear to auscultation       Cardiovascular hypertension, Pt. on medications negative cardio ROS   Rhythm:Regular Rate:Normal  EKG 05/30/17 NSR   Neuro/Psych Anxiety negative neurological ROS     GI/Hepatic negative GI ROS, Neg liver ROS,   Endo/Other  negative endocrine ROSdiabetes, Type 2, Oral Hypoglycemic Agents  Renal/GU negative Renal ROS     Musculoskeletal negative musculoskeletal ROS (+) Arthritis , Osteoarthritis,    Abdominal   Peds  Hematology negative hematology ROS (+)   Anesthesia Other Findings Chronic pain s/p spinal cord stimulator. Stimulator turned off. Day of surgery medications reviewed with the patient.  Reproductive/Obstetrics                             Anesthesia Physical  Anesthesia Plan  ASA: III  Anesthesia Plan: MAC   Post-op Pain Management:    Induction: Intravenous  PONV Risk Score and Plan: 0 and Treatment may vary due to age or medical condition and Propofol infusion  Airway Management Planned: Simple Face Mask and Nasal Cannula  Additional Equipment:   Intra-op Plan:   Post-operative Plan:   Informed Consent: I have reviewed the patients History and Physical, chart, labs and discussed the procedure including the risks, benefits and alternatives for the proposed anesthesia with the patient or authorized representative who has indicated his/her understanding and acceptance.     Dental advisory given  Plan Discussed with: CRNA  Anesthesia Plan Comments:          Anesthesia Quick Evaluation

## 2018-03-09 NOTE — Op Note (Signed)
[facility] Patient Name: Wayne Pratt Procedure Date: 03/09/2018 MRN: [id number] Attending MD: Milus Banister , MD Date of Birth: [birth year] CSN: [id number] Age: 58 Admit Type: Outpatient Procedure:                Upper EUS Indications:              Chroinically dilated main pancreatic duct led to                            EUS 08/2017, at that time the PD was normal but a                            very small soft tissue focus was noted in the                            pancreatic body (4.43mm, too small to sample), this                            6 month surveillance EUS was arranged. Several                            weeks ago he was seen at [facility] for severe                            abd pain; normal lfts, norma lipase but CT reading                            "Mild changes of acute pancreatitis involving the                            head and neck of the pancreas" and his blood sugars                            have become harder to control recently. No weight                            loss. Former etoh abuser but none in 30 years. Providers:                Milus Banister, MD, Cleda Daub, RN, Cletis Athens, Technician, Marla Roe, CRNA Referring MD:             Barney Drain, MD Medicines:                Monitored Anesthesia Care Complications:            No immediate complications. Estimated blood loss:                            None. Estimated Blood Loss:     Estimated blood loss: none. Procedure:                Pre-Anesthesia Assessment:                           -  Prior to the procedure, a History and Physical                            was performed, and patient medications and                            allergies were reviewed. The patient's tolerance of                            previous anesthesia was also reviewed. The risks                            and benefits of the procedure and the sedation               options and risks were discussed with the patient.                            All questions were answered, and informed consent                            was obtained. Prior Anticoagulants: The patient has                            taken no previous anticoagulant or antiplatelet                            agents. ASA Grade Assessment: II - A patient with                            mild systemic disease. After reviewing the risks                            and benefits, the patient was deemed in                            satisfactory condition to undergo the procedure.                           After obtaining informed consent, the endoscope was                            passed under direct vision. Throughout the                            procedure, the patient's blood pressure, pulse, and                            oxygen saturations were monitored continuously. The                            [id number] ([id number]) Olympus Radial EUS was                            introduced through the  mouth, and advanced to the                            second part of duodenum. The [id number] ([id number])                            Olympus Linear EUS was introduced through the                            mouth, and advanced to the second part of duodenum.                            The upper EUS was accomplished without difficulty.                            The patient tolerated the procedure well. Scope In: Scope Out: Findings:      ENDOSCOPIC FINDING: :      The examined esophagus was endoscopically normal.      The entire examined stomach was endoscopically normal.      The examined duodenum was endoscopically normal.      ENDOSONOGRAPHIC FINDING: :      1. The previously noted soft tissue irregularity in the pancreatic body       was not visible today.      2. The main pancreatic duct is normal, non-dilated.      3. The parenchyma in the head of pancreas is vaguely abnormal, somewhat        honeycombed appearing (sign of chronic pancreatitis) for about 2-3cm.       This was not discretely mass-like but I sampled it with 2 transduodenal       passes with a 25 guage EUS FNA needle.      4. CBD is normal, non-dilated      5. No peripancreatic adenopathy.      6. Limited views of liver, spleen, portal and splenic vessels were all       normal. Impression:               1. The previously noted soft tissue irregularity in                            the pancreatic body was not visible today.                           2. The parenchyma in the head of pancreas is                            vaguely abnormal, somewhat honeycombed appearing                            (sign of chronic pancreatitis) for about 2-3cm.                            This was not discretely mass-like but I sampled it                            with  2 transduodenal passes with a 25 guage EUS FNA                            needle. Preliminary cytology reading shows normal                            pancreatic cells, await final report. Moderate Sedation:      Not Applicable - Patient had care per Anesthesia. Recommendation:           - Discharge patient to home.                           - Await cytology results. Procedure Code(s):        --- Professional ---                           [phone], Esophagogastroduodenoscopy, flexible,                            transoral; with transendoscopic ultrasound-guided                            intramural or transmural fine needle                            aspiration/biopsy(s), (includes endoscopic                            ultrasound examination limited to the esophagus,                            stomach or duodenum, and adjacent structures) Diagnosis Code(s):        --- Professional ---                           K86.89, Other specified diseases of pancreas                           R93.3, Abnormal findings on diagnostic imaging of                            other  parts of digestive tract CPT copyright 2018 American Medical Association. All rights reserved. The codes documented in this report are preliminary and upon coder review may  be revised to meet current compliance requirements. Milus Banister, MD 03/09/2018 10:18:23 AM This report has been signed electronically. Number of Addenda: 0

## 2018-03-09 NOTE — H&P (Signed)
HPI: This is a 58 yo man   Chief complaint is abnormal pancreas, here for follow up EUS  ROS: complete GI ROS as described in HPI, all other review negative.  Constitutional:  No unintentional weight loss   Past Medical History:  Diagnosis Date  . Anemia    takes Iron pill daily  . Anxiety    takes Xanax daily  . Arthritis   . Diabetes mellitus ([place])    takes Metformin daily  . History of bronchitis    mid 90's  . History of hypertension    lost weight and been off meds over a yr  . History of kidney stones   . History of pleural empyema   . Hypercholesterolemia    lost weight and been off meds over a yr  . Hypertension   . Insomnia    takes Trazodone nightly  . Kidney stones    22 times  . Neuropathy   . Pneumonia    hx of-15+ yrs ago  . Sleep apnea    Stop Bang score of 7  . Weakness    numbness and tingling    Past Surgical History:  Procedure Laterality Date  . BIOPSY  10/05/2011   Dr. Oneida Alar :non-erosive gastritis  . BIOPSY  08/06/2014   Procedure: BIOPSY;  Surgeon: Danie Binder, MD;  Location: [facility];  Service: Endoscopy;;  . CERVICAL DISC SURGERY    . ELBOW SURGERY Right   . ESOPHAGOGASTRODUODENOSCOPY  Sept 2013   Dr. Oneida Alar: chronic inactive gastritis   . ESOPHAGOGASTRODUODENOSCOPY N/A 08/18/2017   Procedure: ESOPHAGOGASTRODUODENOSCOPY (EGD);  Surgeon: Milus Banister, MD;  Location: [facility];  Service: Endoscopy;  Laterality: N/A;  . ESOPHAGOGASTRODUODENOSCOPY (EGD) WITH PROPOFOL N/A 08/06/2014   mild non-erosive gastritis, duodenitis  . EUS N/A 08/18/2017   Procedure: UPPER ENDOSCOPIC ULTRASOUND (EUS) RADIAL;  Surgeon: Milus Banister, MD;  Location: [facility];  Service: Endoscopy;  Laterality: N/A;  . FLEXIBLE SIGMOIDOSCOPY  10/05/2011   Dr. Fields:poor prep  . LUNG SURGERY  2010   for empyema  . SPINAL CORD STIMULATOR INSERTION N/A 08/01/2015   Procedure: Spinal Cord Stimulator Implant;  Surgeon: Eustace Moore, MD;  Location: [place] [facility]  [facility];  Service: Neurosurgery;  Laterality: N/A;  . TONSILLECTOMY    . VASECTOMY Bilateral 1995    Current Facility-Administered Medications  Medication Dose Route Frequency Provider Last Rate Last Dose  . 0.9 %  sodium chloride infusion   Intravenous Continuous Milus Banister, MD      . lactated ringers infusion   Intravenous Continuous Milus Banister, MD 10 mL/hr at 03/09/18 0835 1,000 mL at 03/09/18 0835    Allergies as of 02/24/2018 - Review Complete 02/17/2018  Allergen Reaction Noted  . Penicillins Anaphylaxis and Other (See Comments) 08/18/2011  . Ativan [lorazepam] Other (See Comments) 09/20/2011    Family History  Problem Relation Age of Onset  . Prostate cancer Other        all male members on mother's side  . Colon cancer Neg Hx     Social History   Socioeconomic History  . Marital status: Married    Spouse name: Not on file  . Number of children: 2  . Years of education: Not on file  . Highest education level: Not on file  Occupational History  . Occupation: disabled    Comment: 2005  Social Needs  . Financial resource strain: Not on file  . Food insecurity:    Worry: Not  on file    Inability: Not on file  . Transportation needs:    Medical: Not on file    Non-medical: Not on file  Tobacco Use  . Smoking status: Current Every Day Smoker    Packs/day: 1.00    Years: 39.00    Pack years: 39.00    Types: Cigarettes  . Smokeless tobacco: Never Used  Substance and Sexual Activity  . Alcohol use: No    Alcohol/week: 0.0 standard drinks  . Drug use: No  . Sexual activity: Yes    Birth control/protection: None  Lifestyle  . Physical activity:    Days per week: Not on file    Minutes per session: Not on file  . Stress: Not on file  Relationships  . Social connections:    Talks on phone: Not on file    Gets together: Not on file    Attends religious service: Not on file    Active member of club or organization: Not on file    Attends meetings of  clubs or organizations: Not on file    Relationship status: Not on file  . Intimate partner violence:    Fear of current or ex partner: Not on file    Emotionally abused: Not on file    Physically abused: Not on file    Forced sexual activity: Not on file  Other Topics Concern  . Not on file  Social History Narrative  . Not on file     Physical Exam: BP (!) 157/89   Pulse 93   Temp 98.4 F (36.9 C) (Oral)   Resp 11   Ht 6\' 1"  (1.854 m)   Wt 108.9 kg   SpO2 99%   BMI 31.66 kg/m  Constitutional: generally well-appearing Psychiatric: alert and oriented x3 Abdomen: soft, nontender, nondistended, no obvious ascites, no peritoneal signs, normal bowel sounds No peripheral edema noted in lower extremities  Assessment and plan: 58 y.o. male with abnromal pancreas  Repeat EUS today  Please see the "Patient Instructions" section for addition details about the plan.  Owens Loffler, MD [facility] 03/09/2018, 8:53 AM

## 2018-03-09 NOTE — Discharge Instructions (Signed)
YOU HAD AN ENDOSCOPIC PROCEDURE TODAY: Refer to the procedure report and other information in the discharge instructions given to you for any specific questions about what was found during the examination. If this information does not answer your questions, please call [facility] office at [phone] to clarify.   YOU SHOULD EXPECT: Some feelings of bloating in the abdomen. Passage of more gas than usual. Walking can help get rid of the air that was put into your GI tract during the procedure and reduce the bloating. If you had a lower endoscopy (such as a colonoscopy or flexible sigmoidoscopy) you may notice spotting of blood in your stool or on the toilet paper. Some abdominal soreness may be present for a day or two, also.  DIET: Your first meal following the procedure should be a light meal and then it is ok to progress to your normal diet. A half-sandwich or bowl of soup is an example of a good first meal. Heavy or fried foods are harder to digest and may make you feel nauseous or bloated. Drink plenty of fluids but you should avoid alcoholic beverages for 24 hours. If you had a esophageal dilation, please see attached instructions for diet.    ACTIVITY: Your care partner should take you home directly after the procedure. You should plan to take it easy, moving slowly for the rest of the day. You can resume normal activity the day after the procedure however YOU SHOULD NOT DRIVE, use power tools, machinery or perform tasks that involve climbing or major physical exertion for 24 hours (because of the sedation medicines used during the test).   SYMPTOMS TO REPORT IMMEDIATELY: A gastroenterologist can be reached at any hour. Please call [phone]  for any of the following symptoms:   Following upper endoscopy (EGD, EUS, ERCP, esophageal dilation) Vomiting of blood or coffee ground material  New, significant abdominal pain  New, significant chest pain or pain under the shoulder blades  Painful or  persistently difficult swallowing  New shortness of breath  Black, tarry-looking or red, bloody stools  FOLLOW UP:  If any biopsies were taken you will be contacted by phone or by letter within the next 1-3 weeks. Call [phone]  if you have not heard about the biopsies in 3 weeks.  Please also call with any specific questions about appointments or follow up tests.  

## 2018-03-10 ENCOUNTER — Encounter (HOSPITAL_COMMUNITY): Payer: Self-pay | Admitting: Gastroenterology

## 2018-03-10 ENCOUNTER — Telehealth: Payer: Self-pay | Admitting: Gastroenterology

## 2018-03-10 NOTE — Telephone Encounter (Signed)
Pt wife called in and stated that her husband is in great pain and he is needing something stronger called in to help ease the pain. She stated the he already takes Oxycodone HCl 10 MG TABS [[id number]]  But that is not helping with the pain.

## 2018-03-10 NOTE — Telephone Encounter (Signed)
I spoke to patient who states he is feeling better at this time. He has taken a dose of Oxycodone 10 mg that his other Doctor prescribed for him awhile back. He is not requesting any other medication at this time as he states his pain has improved. He will call the office for any other question or concerns.

## 2018-03-23 DIAGNOSIS — S43431A Superior glenoid labrum lesion of right shoulder, initial encounter: Secondary | ICD-10-CM | POA: Diagnosis not present

## 2018-03-23 DIAGNOSIS — M75111 Incomplete rotator cuff tear or rupture of right shoulder, not specified as traumatic: Secondary | ICD-10-CM | POA: Diagnosis not present

## 2018-03-23 DIAGNOSIS — M24111 Other articular cartilage disorders, right shoulder: Secondary | ICD-10-CM | POA: Diagnosis not present

## 2018-03-23 DIAGNOSIS — G8918 Other acute postprocedural pain: Secondary | ICD-10-CM | POA: Diagnosis not present

## 2018-03-23 DIAGNOSIS — M7541 Impingement syndrome of right shoulder: Secondary | ICD-10-CM | POA: Diagnosis not present

## 2018-03-30 DIAGNOSIS — E1142 Type 2 diabetes mellitus with diabetic polyneuropathy: Secondary | ICD-10-CM | POA: Diagnosis not present

## 2018-03-30 DIAGNOSIS — Z79891 Long term (current) use of opiate analgesic: Secondary | ICD-10-CM | POA: Diagnosis not present

## 2018-03-30 DIAGNOSIS — G894 Chronic pain syndrome: Secondary | ICD-10-CM | POA: Diagnosis not present

## 2018-03-30 DIAGNOSIS — M25511 Pain in right shoulder: Secondary | ICD-10-CM | POA: Diagnosis not present

## 2018-04-05 DIAGNOSIS — M7541 Impingement syndrome of right shoulder: Secondary | ICD-10-CM | POA: Diagnosis not present

## 2018-04-20 DIAGNOSIS — E119 Type 2 diabetes mellitus without complications: Secondary | ICD-10-CM | POA: Diagnosis not present

## 2018-05-10 DIAGNOSIS — M7541 Impingement syndrome of right shoulder: Secondary | ICD-10-CM | POA: Diagnosis not present

## 2018-05-30 DIAGNOSIS — E1142 Type 2 diabetes mellitus with diabetic polyneuropathy: Secondary | ICD-10-CM | POA: Diagnosis not present

## 2018-05-30 DIAGNOSIS — G894 Chronic pain syndrome: Secondary | ICD-10-CM | POA: Diagnosis not present

## 2018-05-30 DIAGNOSIS — Z79891 Long term (current) use of opiate analgesic: Secondary | ICD-10-CM | POA: Diagnosis not present

## 2018-06-19 ENCOUNTER — Ambulatory Visit (INDEPENDENT_AMBULATORY_CARE_PROVIDER_SITE_OTHER): Payer: PPO | Admitting: Otolaryngology

## 2018-06-19 DIAGNOSIS — H9313 Tinnitus, bilateral: Secondary | ICD-10-CM | POA: Diagnosis not present

## 2018-06-19 DIAGNOSIS — H903 Sensorineural hearing loss, bilateral: Secondary | ICD-10-CM

## 2018-07-17 DIAGNOSIS — Z79899 Other long term (current) drug therapy: Secondary | ICD-10-CM | POA: Diagnosis not present

## 2018-07-17 DIAGNOSIS — E1140 Type 2 diabetes mellitus with diabetic neuropathy, unspecified: Secondary | ICD-10-CM | POA: Diagnosis not present

## 2018-07-17 DIAGNOSIS — I10 Essential (primary) hypertension: Secondary | ICD-10-CM | POA: Diagnosis not present

## 2018-07-17 DIAGNOSIS — F17200 Nicotine dependence, unspecified, uncomplicated: Secondary | ICD-10-CM | POA: Diagnosis not present

## 2018-07-25 DIAGNOSIS — G894 Chronic pain syndrome: Secondary | ICD-10-CM | POA: Diagnosis not present

## 2018-07-25 DIAGNOSIS — Z79891 Long term (current) use of opiate analgesic: Secondary | ICD-10-CM | POA: Diagnosis not present

## 2018-07-25 DIAGNOSIS — E1142 Type 2 diabetes mellitus with diabetic polyneuropathy: Secondary | ICD-10-CM | POA: Diagnosis not present

## 2018-09-19 DIAGNOSIS — G894 Chronic pain syndrome: Secondary | ICD-10-CM | POA: Diagnosis not present

## 2018-09-19 DIAGNOSIS — E1142 Type 2 diabetes mellitus with diabetic polyneuropathy: Secondary | ICD-10-CM | POA: Diagnosis not present

## 2018-09-19 DIAGNOSIS — Z79891 Long term (current) use of opiate analgesic: Secondary | ICD-10-CM | POA: Diagnosis not present

## 2018-10-18 DIAGNOSIS — Z1211 Encounter for screening for malignant neoplasm of colon: Secondary | ICD-10-CM | POA: Diagnosis not present

## 2018-10-18 DIAGNOSIS — I10 Essential (primary) hypertension: Secondary | ICD-10-CM | POA: Diagnosis not present

## 2018-10-18 DIAGNOSIS — Z23 Encounter for immunization: Secondary | ICD-10-CM | POA: Diagnosis not present

## 2018-10-18 DIAGNOSIS — E1140 Type 2 diabetes mellitus with diabetic neuropathy, unspecified: Secondary | ICD-10-CM | POA: Diagnosis not present

## 2018-11-14 DIAGNOSIS — Z79891 Long term (current) use of opiate analgesic: Secondary | ICD-10-CM | POA: Diagnosis not present

## 2018-11-14 DIAGNOSIS — E1142 Type 2 diabetes mellitus with diabetic polyneuropathy: Secondary | ICD-10-CM | POA: Diagnosis not present

## 2018-11-14 DIAGNOSIS — G894 Chronic pain syndrome: Secondary | ICD-10-CM | POA: Diagnosis not present

## 2018-12-18 ENCOUNTER — Ambulatory Visit (INDEPENDENT_AMBULATORY_CARE_PROVIDER_SITE_OTHER): Payer: PPO | Admitting: Otolaryngology

## 2018-12-29 ENCOUNTER — Other Ambulatory Visit: Payer: Self-pay | Admitting: Gastroenterology

## 2019-01-15 DIAGNOSIS — G894 Chronic pain syndrome: Secondary | ICD-10-CM | POA: Diagnosis not present

## 2019-01-15 DIAGNOSIS — Z79891 Long term (current) use of opiate analgesic: Secondary | ICD-10-CM | POA: Diagnosis not present

## 2019-01-15 DIAGNOSIS — E1142 Type 2 diabetes mellitus with diabetic polyneuropathy: Secondary | ICD-10-CM | POA: Diagnosis not present

## 2019-01-17 ENCOUNTER — Emergency Department (HOSPITAL_COMMUNITY)
Admission: EM | Admit: 2019-01-17 | Discharge: 2019-01-17 | Disposition: A | Discharge status: Home / Self Care | Payer: PPO | Attending: Emergency Medicine | Admitting: Emergency Medicine

## 2019-01-17 ENCOUNTER — Other Ambulatory Visit: Payer: Self-pay

## 2019-01-17 ENCOUNTER — Encounter (HOSPITAL_COMMUNITY): Payer: Self-pay | Admitting: Emergency Medicine

## 2019-01-17 DIAGNOSIS — Z5321 Procedure and treatment not carried out due to patient leaving prior to being seen by health care provider: Secondary | ICD-10-CM | POA: Diagnosis not present

## 2019-01-17 DIAGNOSIS — R0789 Other chest pain: Secondary | ICD-10-CM | POA: Insufficient documentation

## 2019-01-17 LAB — CBC
HCT: 44.3 % (ref 39.0–52.0)
Hemoglobin: 14.3 g/dL (ref 13.0–17.0)
MCH: 30.8 pg (ref 26.0–34.0)
MCHC: 32.3 g/dL (ref 30.0–36.0)
MCV: 95.5 fL (ref 80.0–100.0)
Platelets: 307 10*3/uL (ref 150–400)
RBC: 4.64 MIL/uL (ref 4.22–5.81)
RDW: 13.3 % (ref 11.5–15.5)
WBC: 13.3 10*3/uL — ABNORMAL HIGH (ref 4.0–10.5)
nRBC: 0 % (ref 0.0–0.2)

## 2019-01-17 LAB — BASIC METABOLIC PANEL
Anion gap: 10 (ref 5–15)
BUN: 14 mg/dL (ref 6–20)
CO2: 26 mmol/L (ref 22–32)
Calcium: 9.6 mg/dL (ref 8.9–10.3)
Chloride: 99 mmol/L (ref 98–111)
Creatinine, Ser: 1.01 mg/dL (ref 0.61–1.24)
GFR calc Af Amer: >60 mL/min (ref >60)
GFR calc non Af Amer: >60 mL/min (ref >60)
Glucose, Bld: 174 mg/dL — ABNORMAL HIGH (ref 70–99)
Potassium: 4.7 mmol/L (ref 3.5–5.1)
Sodium: 135 mmol/L (ref 135–145)

## 2019-01-17 LAB — TROPONIN I (HIGH SENSITIVITY): Troponin I (High Sensitivity): 5 ng/L (ref <18)

## 2019-01-17 NOTE — ED Triage Notes (Signed)
Pt states that he has been having chest pain in the center of his chest since a week before christmas.

## 2019-01-18 ENCOUNTER — Emergency Department (HOSPITAL_COMMUNITY)
Admission: EM | Admit: 2019-01-18 | Discharge: 2019-01-18 | Disposition: A | Discharge status: Home / Self Care | Payer: PPO | Attending: Emergency Medicine | Admitting: Emergency Medicine

## 2019-01-18 ENCOUNTER — Other Ambulatory Visit: Payer: Self-pay

## 2019-01-18 ENCOUNTER — Emergency Department (HOSPITAL_COMMUNITY): Payer: PPO

## 2019-01-18 ENCOUNTER — Encounter (HOSPITAL_COMMUNITY): Payer: Self-pay

## 2019-01-18 DIAGNOSIS — R0789 Other chest pain: Secondary | ICD-10-CM | POA: Insufficient documentation

## 2019-01-18 DIAGNOSIS — I700 Atherosclerosis of aorta: Secondary | ICD-10-CM | POA: Diagnosis not present

## 2019-01-18 DIAGNOSIS — R079 Chest pain, unspecified: Secondary | ICD-10-CM | POA: Diagnosis not present

## 2019-01-18 DIAGNOSIS — Z7984 Long term (current) use of oral hypoglycemic drugs: Secondary | ICD-10-CM | POA: Insufficient documentation

## 2019-01-18 DIAGNOSIS — R072 Precordial pain: Secondary | ICD-10-CM | POA: Diagnosis not present

## 2019-01-18 DIAGNOSIS — Z79899 Other long term (current) drug therapy: Secondary | ICD-10-CM | POA: Insufficient documentation

## 2019-01-18 DIAGNOSIS — F17210 Nicotine dependence, cigarettes, uncomplicated: Secondary | ICD-10-CM | POA: Diagnosis not present

## 2019-01-18 DIAGNOSIS — E119 Type 2 diabetes mellitus without complications: Secondary | ICD-10-CM | POA: Diagnosis not present

## 2019-01-18 DIAGNOSIS — M545 Low back pain, unspecified: Secondary | ICD-10-CM

## 2019-01-18 DIAGNOSIS — R109 Unspecified abdominal pain: Secondary | ICD-10-CM | POA: Diagnosis not present

## 2019-01-18 DIAGNOSIS — I10 Essential (primary) hypertension: Secondary | ICD-10-CM | POA: Diagnosis not present

## 2019-01-18 DIAGNOSIS — R000 Tachycardia, unspecified: Secondary | ICD-10-CM | POA: Diagnosis not present

## 2019-01-18 LAB — CBC WITH DIFFERENTIAL/PLATELET
Abs Immature Granulocytes: 0.01 10*3/uL (ref 0.00–0.07)
Basophils Absolute: 0 10*3/uL (ref 0.0–0.1)
Basophils Relative: 0 %
Eosinophils Absolute: 0.2 10*3/uL (ref 0.0–0.5)
Eosinophils Relative: 2 %
HCT: 43.4 % (ref 39.0–52.0)
Hemoglobin: 14.2 g/dL (ref 13.0–17.0)
Immature Granulocytes: 0 %
Lymphocytes Relative: 33 %
Lymphs Abs: 3.2 10*3/uL (ref 0.7–4.0)
MCH: 31.3 pg (ref 26.0–34.0)
MCHC: 32.7 g/dL (ref 30.0–36.0)
MCV: 95.6 fL (ref 80.0–100.0)
Monocytes Absolute: 0.7 10*3/uL (ref 0.1–1.0)
Monocytes Relative: 7 %
Neutro Abs: 5.7 10*3/uL (ref 1.7–7.7)
Neutrophils Relative %: 58 %
Platelets: 316 10*3/uL (ref 150–400)
RBC: 4.54 MIL/uL (ref 4.22–5.81)
RDW: 13.4 % (ref 11.5–15.5)
WBC: 9.7 10*3/uL (ref 4.0–10.5)
nRBC: 0 % (ref 0.0–0.2)

## 2019-01-18 LAB — COMPREHENSIVE METABOLIC PANEL
ALT: 22 U/L (ref 0–44)
AST: 20 U/L (ref 15–41)
Albumin: 4.3 g/dL (ref 3.5–5.0)
Alkaline Phosphatase: 57 U/L (ref 38–126)
Anion gap: 9 (ref 5–15)
BUN: 16 mg/dL (ref 6–20)
CO2: 27 mmol/L (ref 22–32)
Calcium: 9.7 mg/dL (ref 8.9–10.3)
Chloride: 100 mmol/L (ref 98–111)
Creatinine, Ser: 1.11 mg/dL (ref 0.61–1.24)
GFR calc Af Amer: >60 mL/min (ref >60)
GFR calc non Af Amer: >60 mL/min (ref >60)
Glucose, Bld: 349 mg/dL — ABNORMAL HIGH (ref 70–99)
Potassium: 4 mmol/L (ref 3.5–5.1)
Sodium: 136 mmol/L (ref 135–145)
Total Bilirubin: 0.3 mg/dL (ref 0.3–1.2)
Total Protein: 7.3 g/dL (ref 6.5–8.1)

## 2019-01-18 LAB — D-DIMER, QUANTITATIVE: D-Dimer, Quant: 0.31 ug/mL-FEU (ref 0.00–0.50)

## 2019-01-18 LAB — TROPONIN I (HIGH SENSITIVITY)
Troponin I (High Sensitivity): 4 ng/L (ref <18)
Troponin I (High Sensitivity): 4 ng/L (ref <18)

## 2019-01-18 LAB — LIPASE, BLOOD: Lipase: 25 U/L (ref 11–51)

## 2019-01-18 MED ORDER — OXYCODONE-ACETAMINOPHEN 5-325 MG PO TABS: 1.0000 | ORAL_TABLET | ORAL | 0 refills | Status: DC | PRN

## 2019-01-18 MED ORDER — ONDANSETRON HCL 8 MG PO TABS: 8.0000 mg | ORAL_TABLET | ORAL | 0 refills | Status: DC | PRN

## 2019-01-18 MED ORDER — MORPHINE SULFATE (PF) 4 MG/ML IV SOLN
4.0000 mg | Freq: Once | INTRAVENOUS | Status: AC
  Administered 2019-01-18: 4 mg via INTRAVENOUS
  Filled 2019-01-18: qty 1

## 2019-01-18 MED ORDER — PANTOPRAZOLE SODIUM 40 MG IV SOLR
40.0000 mg | Freq: Once | INTRAVENOUS | Status: AC
  Administered 2019-01-18: 18:00:00 40 mg via INTRAVENOUS
  Filled 2019-01-18: qty 40

## 2019-01-18 MED ORDER — FENTANYL CITRATE (PF) 100 MCG/2ML IJ SOLN
50.0000 ug | Freq: Once | INTRAMUSCULAR | Status: AC
  Administered 2019-01-18: 50 ug via INTRAVENOUS
  Filled 2019-01-18: qty 2

## 2019-01-18 MED ORDER — IOHEXOL 350 MG/ML SOLN
100.0000 mL | Freq: Once | INTRAVENOUS | Status: AC | PRN
  Administered 2019-01-18: 23:00:00 100 mL via INTRAVENOUS

## 2019-01-18 NOTE — ED Triage Notes (Signed)
Pt reports has had chest pain x  3 weeks.  Reports pain started radiating to his back approx 1 hour ago.  Denies sob.  Reports intermittent nausea.  Denies injury or cough.

## 2019-01-18 NOTE — ED Provider Notes (Addendum)
[facility] Provider Note   CSN: [id number] Arrival date & time: 01/18/19  1559     History Chief Complaint  Patient presents with  . Chest Pain    Wayne Pratt is a 59 y.o. male.  Lower sternal chest pain described as a twisting sensation with radiation to the back intermittently for 3 weeks, now becoming more frequent with associated nausea.  No frank dyspnea or diaphoresis.  Cardiac risk factors include hypertension, diabetes, smoking, many others.  Pain is worse with sitting up.  Severity is moderate.        Past Medical History:  Diagnosis Date  . Anemia    takes Iron pill daily  . Anxiety    takes Xanax daily  . Arthritis   . Diabetes mellitus ([place])    takes Metformin daily  . History of bronchitis    mid 90's  . History of hypertension    lost weight and been off meds over a yr  . History of kidney stones   . History of pleural empyema   . Hypercholesterolemia    lost weight and been off meds over a yr  . Hypertension   . Insomnia    takes Trazodone nightly  . Kidney stones    22 times  . Neuropathy   . Pneumonia    hx of-15+ yrs ago  . Sleep apnea    Stop Bang score of 7  . Weakness    numbness and tingling    Patient Active Problem List   Diagnosis Date Noted  . Abdominal pain, epigastric 06/20/2017  . Dilation of pancreatic duct 06/20/2017  . S/P insertion of spinal cord stimulator 08/01/2015  . Rectal bleeding 07/17/2014  . Anemia 07/17/2014  . Fatty liver 09/07/2011  . Diabetes mellitus ([place]) 09/07/2011  . Encounter for screening colonoscopy 09/07/2011  . Chronic pain 09/07/2011    Past Surgical History:  Procedure Laterality Date  . BIOPSY  10/05/2011   Dr. Oneida Alar :non-erosive gastritis  . BIOPSY  08/06/2014   Procedure: BIOPSY;  Surgeon: Danie Binder, MD;  Location: [facility];  Service: Endoscopy;;  . CERVICAL DISC SURGERY    . ELBOW SURGERY Right   . ESOPHAGOGASTRODUODENOSCOPY  Sept 2013   Dr. Oneida Alar: chronic  inactive gastritis   . ESOPHAGOGASTRODUODENOSCOPY N/A 08/18/2017   Procedure: ESOPHAGOGASTRODUODENOSCOPY (EGD);  Surgeon: Milus Banister, MD;  Location: [facility];  Service: Endoscopy;  Laterality: N/A;  . ESOPHAGOGASTRODUODENOSCOPY N/A 03/09/2018   Procedure: ESOPHAGOGASTRODUODENOSCOPY (EGD);  Surgeon: Milus Banister, MD;  Location: [facility];  Service: Endoscopy;  Laterality: N/A;  . ESOPHAGOGASTRODUODENOSCOPY (EGD) WITH PROPOFOL N/A 08/06/2014   mild non-erosive gastritis, duodenitis  . EUS N/A 08/18/2017   Procedure: UPPER ENDOSCOPIC ULTRASOUND (EUS) RADIAL;  Surgeon: Milus Banister, MD;  Location: [facility];  Service: Endoscopy;  Laterality: N/A;  . EUS N/A 03/09/2018   Procedure: UPPER ENDOSCOPIC ULTRASOUND (EUS) RADIAL;  Surgeon: Milus Banister, MD;  Location: [facility];  Service: Endoscopy;  Laterality: N/A;  . FINE NEEDLE ASPIRATION  03/09/2018   Procedure: FINE NEEDLE ASPIRATION;  Surgeon: Milus Banister, MD;  Location: [facility];  Service: Endoscopy;;  . FLEXIBLE SIGMOIDOSCOPY  10/05/2011   Dr. Fields:poor prep  . LUNG SURGERY  2010   for empyema  . SPINAL CORD STIMULATOR INSERTION N/A 08/01/2015   Procedure: Spinal Cord Stimulator Implant;  Surgeon: Eustace Moore, MD;  Location: [facility];  Service: Neurosurgery;  Laterality: N/A;  . TONSILLECTOMY    .  VASECTOMY Bilateral 1995       Family History  Problem Relation Age of Onset  . Prostate cancer Other        all male members on mother's side  . Colon cancer Neg Hx     Social History   Tobacco Use  . Smoking status: Current Every Day Smoker    Packs/day: 1.00    Years: 39.00    Pack years: 39.00    Types: Cigarettes  . Smokeless tobacco: Never Used  Substance Use Topics  . Alcohol use: No    Alcohol/week: 0.0 standard drinks  . Drug use: No    Home Medications Prior to Admission medications   Medication Sig Start Date End Date Taking? Authorizing Provider  clonazePAM (KLONOPIN) 0.5 MG  tablet Take 0.5 mg by mouth 2 (two) times daily.  06/17/17  Yes [provider]  cyclobenzaprine (FLEXERIL) 10 MG tablet Take 10 mg by mouth 3 (three) times daily as needed for muscle spasms.  04/14/17  Yes [provider]  GLIPIZIDE XL 10 MG 24 hr tablet Take 10 mg by mouth 2 (two) times daily. 01/17/19  Yes [provider]  lisinopril (ZESTRIL) 5 MG tablet Take 5 mg by mouth daily. 01/17/19  Yes [provider]  lovastatin (MEVACOR) 40 MG tablet Take 40 mg by mouth at bedtime.  04/01/17  Yes [provider]  metFORMIN (GLUCOPHAGE) 1000 MG tablet Take 1,000 mg by mouth 2 (two) times daily.  04/01/17  Yes [provider]  morphine (MSIR) 15 MG tablet Take 1 tablet by mouth every 4 (four) hours as needed for pain. 11/14/18  Yes Nicholaus Bloom, MD  naproxen sodium (ALEVE) 220 MG tablet Take 440 mg by mouth daily as needed (for pain or headache).   Yes [provider]  omeprazole (PRILOSEC) 20 MG capsule TAKE 1 CAPSULE ONCE DAILY 30 MINUTES BEFORE YOUR FIRST MEAL. Patient taking differently: Take 20 mg by mouth daily.  12/29/18  Yes Annitta Needs, NP  Oxycodone HCl 10 MG TABS Take 10 mg by mouth every 4 (four) hours.    Yes [provider]  pantoprazole (PROTONIX) 40 MG tablet TAKE 1 TABLET BY MOUTH 30 MINUTES PRIOR TO BREAKFAST. Patient taking differently: Take 40 mg by mouth daily.  02/15/18  Yes Annitta Needs, NP  pioglitazone (ACTOS) 15 MG tablet Take 15 mg by mouth daily. 01/17/19  Yes [provider]  HYDROmorphone (DILAUDID) 4 MG tablet Take 1 tablet (4 mg total) by mouth every 6 (six) hours as needed for severe pain. 02/17/18   Milton Ferguson, MD  IBU 800 MG tablet Take 800 mg by mouth daily as needed for headache.  01/20/18   [provider]  ondansetron (ZOFRAN ODT) 4 MG disintegrating tablet 4mg  ODT q4 hours prn nausea/vomit 02/17/18   Milton Ferguson, MD    Allergies    Penicillins and Ativan [lorazepam]  Review of  Systems   Review of Systems  All other systems reviewed and are negative.   Physical Exam Updated Vital Signs BP 139/89   Pulse 88   Temp 98.3 F (36.8 C) (Oral)   Resp 13   Ht 6\' 1"  (1.854 m)   Wt 104 kg   SpO2 97%   BMI 30.25 kg/m   Physical Exam Vitals and nursing note reviewed.  Constitutional:      Appearance: He is well-developed.  HENT:     Head: Normocephalic and atraumatic.  Eyes:     Conjunctiva/sclera:  Conjunctivae normal.  Cardiovascular:     Rate and Rhythm: Normal rate and regular rhythm.  Pulmonary:     Effort: Pulmonary effort is normal.     Breath sounds: Normal breath sounds.  Abdominal:     General: Bowel sounds are normal.     Palpations: Abdomen is soft.  Musculoskeletal:        General: Normal range of motion.     Cervical back: Neck supple.  Skin:    General: Skin is warm and dry.  Neurological:     General: No focal deficit present.     Mental Status: He is alert and oriented to person, place, and time.  Psychiatric:        Behavior: Behavior normal.     ED Results / Procedures / Treatments   Labs (all labs ordered are listed, but only abnormal results are displayed) Labs Reviewed  COMPREHENSIVE METABOLIC PANEL - Abnormal; Notable for the following components:      Result Value   Glucose, Bld 349 (*)    All other components within normal limits  CBC WITH DIFFERENTIAL/PLATELET  LIPASE, BLOOD  D-DIMER, QUANTITATIVE (NOT AT [facility])  TROPONIN I (HIGH SENSITIVITY)  TROPONIN I (HIGH SENSITIVITY)    EKG EKG Interpretation  Date/Time:  Thursday January 18 2019 16:20:34 EST Ventricular Rate:  100 PR Interval:    QRS Duration: 89 QT Interval:  329 QTC Calculation: 425 R Axis:   54 Text Interpretation: Sinus tachycardia Confirmed by Nat Christen [phone]) on 01/18/2019 4:56:54 PM   Radiology DG Chest Port 1 View  Result Date: 01/18/2019 CLINICAL DATA:  Chest pain. EXAM: PORTABLE CHEST 1 VIEW COMPARISON:  05/30/2017.  07/30/2015.  FINDINGS: Mediastinum and hilar structures are stable given technique. Lungs are clear. No pleural effusion or pneumothorax. Heart size normal. Prior cervical spine fusion. Neurostimulator noted with tip over the lower thoracic spine in stable position. IMPRESSION: No acute cardiopulmonary disease.  Stable chest from prior exam. Electronically Signed   By: Marcello Moores  Register   On: 01/18/2019 17:02    Procedures Procedures (including critical care time)  Medications Ordered in ED Medications  morphine 4 MG/ML injection 4 mg (4 mg Intravenous Given 01/18/19 1720)  pantoprazole (PROTONIX) injection 40 mg (40 mg Intravenous Given 01/18/19 1820)  fentaNYL (SUBLIMAZE) injection 50 mcg (50 mcg Intravenous Given 01/18/19 1910)  iohexol (OMNIPAQUE) 350 MG/ML injection 100 mL (100 mLs Intravenous Contrast Given 01/18/19 2242)    ED Course  I have reviewed the triage vital signs and the nursing notes.  Pertinent labs & imaging results that were available during my care of the patient were reviewed by me and considered in my medical decision making (see chart for details).    MDM Rules/Calculators/A&P                      Chief complaint chest pain.  Multiple cardiac risk factors.  Will do typical cardiac work-up.  Patient rechecked multiple times.  Still complains of lower sternal pain with radiation to the lower back.  Troponin negative x2.  Will obtain CT angiogram of chest abdomen pelvis to rule out dissection.  Pain management.  CRITICAL CARE Performed by: Nat Christen Total critical care time: 35 minutes Critical care time was exclusive of separately billable procedures and treating other patients. Critical care was necessary to treat or prevent imminent or life-threatening deterioration. Critical care was time spent personally by me on the following activities: development of treatment plan with patient and/or surrogate  as well as nursing, discussions with consultants, evaluation of patient's response  to treatment, examination of patient, obtaining history from patient or surrogate, ordering and performing treatments and interventions, ordering and review of laboratory studies, ordering and review of radiographic studies, pulse oximetry and re-evaluation of patient's condition. Final Clinical Impression(s) / ED Diagnoses Final diagnoses:  Chest pain, unspecified type    Rx / DC Orders ED Discharge Orders    None       Nat Christen, MD 01/18/19 1700    Nat Christen, MD 01/18/19 2245

## 2019-01-18 NOTE — Discharge Instructions (Signed)
CT scan did not show any concerns about abdominal aneurysm.  There was a slight haziness around your pancreas, but your pancreas blood work was normal.  Recommend follow-up with cardiology.  Phone number given.  Return if worse.  Medication for pain and nausea sent to your pharmacy.

## 2019-01-22 ENCOUNTER — Encounter: Payer: Self-pay | Admitting: Cardiology

## 2019-01-22 ENCOUNTER — Other Ambulatory Visit: Payer: Self-pay

## 2019-01-22 ENCOUNTER — Ambulatory Visit (INDEPENDENT_AMBULATORY_CARE_PROVIDER_SITE_OTHER): Payer: PPO | Admitting: Cardiology

## 2019-01-22 VITALS — BP 151/82 | HR 90 | Temp 98.1°F | Ht 73.0 in | Wt 246.4 lb

## 2019-01-22 DIAGNOSIS — Z01812 Encounter for preprocedural laboratory examination: Secondary | ICD-10-CM | POA: Diagnosis not present

## 2019-01-22 DIAGNOSIS — R072 Precordial pain: Secondary | ICD-10-CM

## 2019-01-22 DIAGNOSIS — E785 Hyperlipidemia, unspecified: Secondary | ICD-10-CM

## 2019-01-22 DIAGNOSIS — I10 Essential (primary) hypertension: Secondary | ICD-10-CM

## 2019-01-22 MED ORDER — LISINOPRIL 10 MG PO TABS: 10.0000 mg | ORAL_TABLET | Freq: Every day | ORAL | 3 refills | Status: DC

## 2019-01-22 MED ORDER — METOPROLOL TARTRATE 100 MG PO TABS: ORAL_TABLET | ORAL | 0 refills | Status: DC

## 2019-01-22 NOTE — Progress Notes (Signed)
Cardiology Office Note:    Date:  01/22/2019   ID:  Wayne Pratt, DOB [birth year], MRN [id number]  PCP:  Sandi Mealy, MD  Cardiologist:  No primary care provider on file.  Electrophysiologist:  None   Referring MD: Sandi Mealy, MD   Chief Complaint  Patient presents with  . Chest Pain    History of Present Illness:    Wayne Pratt is a 59 y.o. male with a hx of diabetes, hypertension, hyperlipidemia who presents for ED follow-up of chest pain.  He was seen in the ED on 01/17/2018 with chest pain radiating to his back.  CTA chest abdomen pelvis was done which showed no evidence of aortic dissection, but did show significant multivessel coronary calcifications.  High-sensitivity troponins were negative x3.  EKG did not show ischemic changes.  Reports that chest pain started a week before Christmas.  Describes as substernal pressure, radiating to his back.  States that pain was up to 10 out of 10.  Initially intermittent but prior to his ED visit he was having continuous chest pain for 3 days.  States that his pain has improved since his ED visit but he continues to have the chest pain.  Currently 8 out of 10 pain.  Does not exercise regularly, but has not noted worsening pain with exertion.  Smokes 1ppd, down from 2.5 ppd. Reports recent A1c 7.6%.  Reports BP hs been elevated.  Father had MI in 43s.    Past Medical History:  Diagnosis Date  . Anemia    takes Iron pill daily  . Anxiety    takes Xanax daily  . Arthritis   . Diabetes mellitus ([place])    takes Metformin daily  . History of bronchitis    mid 90's  . History of hypertension    lost weight and been off meds over a yr  . History of kidney stones   . History of pleural empyema   . Hypercholesterolemia    lost weight and been off meds over a yr  . Hypertension   . Insomnia    takes Trazodone nightly  . Kidney stones    22 times  . Neuropathy   . Pneumonia    hx of-15+ yrs ago  . Sleep apnea    Stop Bang  score of 7  . Weakness    numbness and tingling    Past Surgical History:  Procedure Laterality Date  . BIOPSY  10/05/2011   Dr. Oneida Alar :non-erosive gastritis  . BIOPSY  08/06/2014   Procedure: BIOPSY;  Surgeon: Danie Binder, MD;  Location: [facility];  Service: Endoscopy;;  . CERVICAL DISC SURGERY    . ELBOW SURGERY Right   . ESOPHAGOGASTRODUODENOSCOPY  Sept 2013   Dr. Oneida Alar: chronic inactive gastritis   . ESOPHAGOGASTRODUODENOSCOPY N/A 08/18/2017   Procedure: ESOPHAGOGASTRODUODENOSCOPY (EGD);  Surgeon: Milus Banister, MD;  Location: [facility];  Service: Endoscopy;  Laterality: N/A;  . ESOPHAGOGASTRODUODENOSCOPY N/A 03/09/2018   Procedure: ESOPHAGOGASTRODUODENOSCOPY (EGD);  Surgeon: Milus Banister, MD;  Location: [facility];  Service: Endoscopy;  Laterality: N/A;  . ESOPHAGOGASTRODUODENOSCOPY (EGD) WITH PROPOFOL N/A 08/06/2014   mild non-erosive gastritis, duodenitis  . EUS N/A 08/18/2017   Procedure: UPPER ENDOSCOPIC ULTRASOUND (EUS) RADIAL;  Surgeon: Milus Banister, MD;  Location: [facility];  Service: Endoscopy;  Laterality: N/A;  . EUS N/A 03/09/2018   Procedure: UPPER ENDOSCOPIC ULTRASOUND (EUS) RADIAL;  Surgeon: Milus Banister, MD;  Location: [facility];  Service:  Endoscopy;  Laterality: N/A;  . FINE NEEDLE ASPIRATION  03/09/2018   Procedure: FINE NEEDLE ASPIRATION;  Surgeon: Milus Banister, MD;  Location: [facility];  Service: Endoscopy;;  . FLEXIBLE SIGMOIDOSCOPY  10/05/2011   Dr. Fields:poor prep  . LUNG SURGERY  2010   for empyema  . SPINAL CORD STIMULATOR INSERTION N/A 08/01/2015   Procedure: Spinal Cord Stimulator Implant;  Surgeon: Eustace Moore, MD;  Location: [facility];  Service: Neurosurgery;  Laterality: N/A;  . TONSILLECTOMY    . VASECTOMY Bilateral 1995    Current Medications: Current Meds  Medication Sig  . clonazePAM (KLONOPIN) 0.5 MG tablet Take 0.5 mg by mouth 2 (two) times daily.   . cyclobenzaprine (FLEXERIL) 10 MG tablet Take 10 mg by  mouth 3 (three) times daily as needed for muscle spasms.   [name] Kitchen GLIPIZIDE XL 10 MG 24 hr tablet Take 10 mg by mouth 2 (two) times daily.  [name] Kitchen HYDROmorphone (DILAUDID) 4 MG tablet Take 1 tablet (4 mg total) by mouth every 6 (six) hours as needed for severe pain.  . IBU 800 MG tablet Take 800 mg by mouth daily as needed for headache.   . lisinopril (ZESTRIL) 10 MG tablet Take 1 tablet (10 mg total) by mouth daily.  [name] Kitchen lovastatin (MEVACOR) 40 MG tablet Take 40 mg by mouth at bedtime.   . metFORMIN (GLUCOPHAGE) 1000 MG tablet Take 1,000 mg by mouth 2 (two) times daily.   [name] Kitchen morphine (MSIR) 15 MG tablet Take 1 tablet by mouth every 4 (four) hours as needed for pain.  . naproxen sodium (ALEVE) 220 MG tablet Take 440 mg by mouth daily as needed (for pain or headache).  [name] Kitchen omeprazole (PRILOSEC) 20 MG capsule TAKE 1 CAPSULE ONCE DAILY 30 MINUTES BEFORE YOUR FIRST MEAL. (Patient taking differently: Take 20 mg by mouth daily. )  . ondansetron (ZOFRAN ODT) 4 MG disintegrating tablet 4mg  ODT q4 hours prn nausea/vomit  . ondansetron (ZOFRAN) 8 MG tablet Take 1 tablet (8 mg total) by mouth every 4 (four) hours as needed.  . Oxycodone HCl 10 MG TABS Take 10 mg by mouth every 4 (four) hours.   [name] Kitchen oxyCODONE-acetaminophen (PERCOCET) 5-325 MG tablet Take 1 tablet by mouth every 4 (four) hours as needed.  . pantoprazole (PROTONIX) 40 MG tablet TAKE 1 TABLET BY MOUTH 30 MINUTES PRIOR TO BREAKFAST. (Patient taking differently: Take 40 mg by mouth daily. )  . pioglitazone (ACTOS) 15 MG tablet Take 15 mg by mouth daily.  . [DISCONTINUED] lisinopril (ZESTRIL) 5 MG tablet Take 5 mg by mouth daily.     Allergies:   Penicillins and Ativan [lorazepam]   Social History   Socioeconomic History  . Marital status: Married    Spouse name: Not on file  . Number of children: 2  . Years of education: Not on file  . Highest education level: Not on file  Occupational History  . Occupation: disabled    Comment: 2005  Tobacco Use  .  Smoking status: Current Every Day Smoker    Packs/day: 1.00    Years: 39.00    Pack years: 39.00    Types: Cigarettes  . Smokeless tobacco: Never Used  Substance and Sexual Activity  . Alcohol use: No    Alcohol/week: 0.0 standard drinks  . Drug use: No  . Sexual activity: Yes    Birth control/protection: None  Other Topics Concern  . Not on file  Social History Narrative  . Not on file  Social Determinants of Health   Financial Resource Strain:   . Difficulty of Paying Living Expenses: Not on file  Food Insecurity:   . Worried About Charity fundraiser in the Last Year: Not on file  . Ran Out of Food in the Last Year: Not on file  Transportation Needs:   . Lack of Transportation (Medical): Not on file  . Lack of Transportation (Non-Medical): Not on file  Physical Activity:   . Days of Exercise per Week: Not on file  . Minutes of Exercise per Session: Not on file  Stress:   . Feeling of Stress : Not on file  Social Connections:   . Frequency of Communication with Friends and Family: Not on file  . Frequency of Social Gatherings with Friends and Family: Not on file  . Attends Religious Services: Not on file  . Active Member of Clubs or Organizations: Not on file  . Attends Archivist Meetings: Not on file  . Marital Status: Not on file     Family History: The patient's family history includes COPD in his maternal grandmother; Cancer in his paternal grandmother; Heart attack in his father; Prostate cancer in an other family member. There is no history of Colon cancer.  ROS:   Please see the history of present illness.     All other systems reviewed and are negative.  EKGs/Labs/Other Studies Reviewed:    The following studies were reviewed today:   EKG:  EKG is  ordered today.  The ekg ordered today demonstrates normal sinus rhythm, rate 90, no ST/T abnormalities  Recent Labs: 01/18/2019: ALT 22; BUN 16; Creatinine, Ser 1.11; Hemoglobin 14.2; Platelets  316; Potassium 4.0; Sodium 136  Recent Lipid Panel    Component Value Date/Time   CHOL  04/01/2008 0345    129        ATP III CLASSIFICATION:  <200     mg/dL   Desirable  200-239  mg/dL   Borderline High  >=240    mg/dL   High          TRIG 156 (H) 04/01/2008 0345    Physical Exam:    VS:  BP (!) 151/82   Pulse 90   Temp 98.1 F (36.7 C)   Ht 6\' 1"  (1.854 m)   Wt 246 lb 6.4 oz (111.8 kg)   SpO2 98%   BMI 32.51 kg/m     Wt Readings from Last 3 Encounters:  01/22/19 246 lb 6.4 oz (111.8 kg)  01/18/19 229 lb 4.5 oz (104 kg)  03/09/18 240 lb (108.9 kg)     GEN:  Well nourished, well developed in no acute distress HEENT: Normal NECK: No JVD; No carotid bruits LYMPHATICS: No lymphadenopathy CARDIAC: RRR, no murmurs, rubs, gallops RESPIRATORY:  Clear to auscultation without rales, wheezing or rhonchi  ABDOMEN: Soft, non-tender, non-distended MUSCULOSKELETAL:  No edema; No deformity  SKIN: Warm and dry NEUROLOGIC:  Alert and oriented x 3 PSYCHIATRIC:  Normal affect   ASSESSMENT:    1. Precordial pain   2. Pre-procedure lab exam   3. Essential hypertension   4. Hyperlipidemia, unspecified hyperlipidemia type    PLAN:    In order of problems listed above:  Chest pain: Atypical in description, as describes substernal pressure not related to stress or exertion.  However he does have significant risk factors for coronary artery disease, and recent CTA chest showed multivessel coronary calcifications. -Coronary CTA -TTE  Hypertension: On lisinopril 5 mg daily.  BP elevated, will increase lisinopril to 10 mg daily.  Will check BMET in 1 week.  Hyperlipidemia: On lovastatin 40 mg daily.  No recent lipid panel on record.  Will check lipid panel.  Depending on results of lipid panel, will likely change to higher intensity statin given diabetes  Type 2 diabetes: Reports last A1c 7.6%.  On Metformin, glipizide, Actos  RTC in 3 months   Medication Adjustments/Labs and  Tests Ordered: Current medicines are reviewed at length with the patient today.  Concerns regarding medicines are outlined above.  Orders Placed This Encounter  Procedures  . CT CORONARY MORPH W/CTA COR W/SCORE W/CA W/CM &/OR WO/CM  . CT CORONARY FRACTIONAL FLOW RESERVE DATA PREP  . CT CORONARY FRACTIONAL FLOW RESERVE FLUID ANALYSIS  . Basic metabolic panel  . Lipid panel  . ECHOCARDIOGRAM COMPLETE   Meds ordered this encounter  Medications  . lisinopril (ZESTRIL) 10 MG tablet    Sig: Take 1 tablet (10 mg total) by mouth daily.    Dispense:  90 tablet    Refill:  3  . metoprolol tartrate (LOPRESSOR) 100 MG tablet    Sig: TAKE 1 TABLET 2 HR PRIOR TO CARDIAC PROCEDURE    Dispense:  1 tablet    Refill:  0    Patient Instructions  Medication Instructions:  Increase Lisinopril to 10 mg daily  *If you need a refill on your cardiac medications before your next appointment, please call your pharmacy*  Lab Work: Your physician recommends that you return for lab work today ( BMP, Lipid)  If you have labs (blood work) drawn today and your tests are completely normal, you will receive your results only by: [name] Kitchen MyChart Message (if you have MyChart) OR . A paper copy in the mail If you have any lab test that is abnormal or we need to change your treatment, we will call you to review the results.  Testing/Procedures: Your physician has requested that you have an echocardiogram. Echocardiography is a painless test that uses sound waves to create images of your heart. It provides your doctor with information about the size and shape of your heart and how well your heart's chambers and valves are working. This procedure takes approximately one hour. There are no restrictions for this procedure. [place] [address]  CT Angiography (CTA), is a special type of CT scan that uses a computer to produce multi-dimensional views of major blood vessels throughout the body. In CT angiography, a  contrast material is injected through an IV to help visualize the blood vessels [facility]  Follow-Up: At [facility], you and your health needs are our priority.  As part of our continuing mission to provide you with exceptional heart care, we have created designated Provider Care Teams.  These Care Teams include your primary Cardiologist (physician) and Advanced Practice Providers (APPs -  Physician Assistants and Nurse Practitioners) who all work together to provide you with the care you need, when you need it.  Your next appointment:   3 month(s)  The format for your next appointment:   In Person  Provider:   Oswaldo Milian, MD  Your cardiac CT will be scheduled at one of the below locations:   [facility] [address] [place], [place] [address] [phone]  If scheduled at [facility], please arrive at the [facility] main entrance of [facility] 30-45 minutes prior to test start time. Proceed to the [facility]  Radiology Department (first floor) to check-in and test prep.  Please follow these instructions carefully (unless otherwise directed):  Hold all erectile dysfunction medications at least 3 days (72 hrs) prior to test.  On the Night Before the Test: . Be sure to Drink plenty of water. . Do not consume any caffeinated/decaffeinated beverages or chocolate 12 hours prior to your test. . Do not take any antihistamines 12 hours prior to your test.  On the Day of the Test: . Drink plenty of water. Do not drink any water within one hour of the test. . Do not eat any food 4 hours prior to the test. . You may take your regular medications prior to the test.  . Take metoprolol (Lopressor) two hours prior to test. . HOLD Furosemide/Hydrochlorothiazide morning of the test. . FEMALES- please wear underwire-free bra if available       After the Test: . Drink plenty of water. . After receiving IV contrast, you may experience  a mild flushed feeling. This is normal. . On occasion, you may experience a mild rash up to 24 hours after the test. This is not dangerous. If this occurs, you can take Benadryl 25 mg and increase your fluid intake. . If you experience trouble breathing, this can be serious. If it is severe call 911 IMMEDIATELY. If it is mild, please call our office. . If you take any of these medications: Glipizide/Metformin, Avandament, Glucavance, please do not take 48 hours after completing test unless otherwise instructed.   Once we have confirmed authorization from your insurance company, we will call you to set up a date and time for your test.   For non-scheduling related questions, please contact the cardiac imaging nurse navigator should you have any questions/concerns: Marchia Bond, RN Navigator Cardiac Imaging [facility] [phone] Office       Signed, Donato Heinz, MD  01/22/2019 4:29 PM    [place]

## 2019-01-22 NOTE — Patient Instructions (Addendum)
Medication Instructions:  Increase Lisinopril to 10 mg daily  *If you need a refill on your cardiac medications before your next appointment, please call your pharmacy*  Lab Work: Your physician recommends that you return for lab work today ( BMP, Lipid)  If you have labs (blood work) drawn today and your tests are completely normal, you will receive your results only by: [name] Kitchen MyChart Message (if you have MyChart) OR . A paper copy in the mail If you have any lab test that is abnormal or we need to change your treatment, we will call you to review the results.  Testing/Procedures: Your physician has requested that you have an echocardiogram. Echocardiography is a painless test that uses sound waves to create images of your heart. It provides your doctor with information about the size and shape of your heart and how well your heart's chambers and valves are working. This procedure takes approximately one hour. There are no restrictions for this procedure. [place] [address]  CT Angiography (CTA), is a special type of CT scan that uses a computer to produce multi-dimensional views of major blood vessels throughout the body. In CT angiography, a contrast material is injected through an IV to help visualize the blood vessels [facility]  Follow-Up: At [facility], you and your health needs are our priority.  As part of our continuing mission to provide you with exceptional heart care, we have created designated Provider Care Teams.  These Care Teams include your primary Cardiologist (physician) and Advanced Practice Providers (APPs -  Physician Assistants and Nurse Practitioners) who all work together to provide you with the care you need, when you need it.  Your next appointment:   3 month(s)  The format for your next appointment:   In Person  Provider:   Oswaldo Milian, MD  Your cardiac CT will be scheduled at one of the below locations:   [facility] [address] [place], [place] [address] [phone]  If scheduled at [facility], please arrive at the [facility] main entrance of [facility] 30-45 minutes prior to test start time. Proceed to the [facility] Radiology Department (first floor) to check-in and test prep.  Please follow these instructions carefully (unless otherwise directed):  Hold all erectile dysfunction medications at least 3 days (72 hrs) prior to test.  On the Night Before the Test: . Be sure to Drink plenty of water. . Do not consume any caffeinated/decaffeinated beverages or chocolate 12 hours prior to your test. . Do not take any antihistamines 12 hours prior to your test.  On the Day of the Test: . Drink plenty of water. Do not drink any water within one hour of the test. . Do not eat any food 4 hours prior to the test. . You may take your regular medications prior to the test.  . Take metoprolol (Lopressor) two hours prior to test. . HOLD Furosemide/Hydrochlorothiazide morning of the test. . FEMALES- please wear underwire-free bra if available       After the Test: . Drink plenty of water. . After receiving IV contrast, you may experience a mild flushed feeling. This is normal. . On occasion, you may experience a mild rash up to 24 hours after the test. This is not dangerous. If this occurs, you can take Benadryl 25 mg and increase your fluid intake. . If you experience trouble breathing, this can be serious. If it is severe call 911  IMMEDIATELY. If it is mild, please call our office. . If you take any of these medications: Glipizide/Metformin, Avandament, Glucavance, please do not take 48 hours after completing test unless otherwise instructed.   Once we have confirmed authorization from your insurance company, we will call you to set up a date and time for your test.   For non-scheduling related questions, please contact the cardiac imaging nurse navigator should you  have any questions/concerns: Marchia Bond, RN Navigator Cardiac Imaging [facility] [phone] Office

## 2019-01-24 ENCOUNTER — Other Ambulatory Visit (INDEPENDENT_AMBULATORY_CARE_PROVIDER_SITE_OTHER): Payer: PPO

## 2019-01-24 DIAGNOSIS — R072 Precordial pain: Secondary | ICD-10-CM

## 2019-01-24 DIAGNOSIS — I10 Essential (primary) hypertension: Secondary | ICD-10-CM

## 2019-01-24 DIAGNOSIS — E785 Hyperlipidemia, unspecified: Secondary | ICD-10-CM | POA: Diagnosis not present

## 2019-01-31 ENCOUNTER — Other Ambulatory Visit (HOSPITAL_COMMUNITY): Payer: PPO

## 2019-02-07 ENCOUNTER — Other Ambulatory Visit (HOSPITAL_COMMUNITY): Payer: PPO

## 2019-02-14 ENCOUNTER — Ambulatory Visit (INDEPENDENT_AMBULATORY_CARE_PROVIDER_SITE_OTHER): Payer: PPO

## 2019-02-14 ENCOUNTER — Other Ambulatory Visit: Payer: Self-pay

## 2019-02-14 DIAGNOSIS — R072 Precordial pain: Secondary | ICD-10-CM

## 2019-02-19 ENCOUNTER — Telehealth: Payer: Self-pay | Admitting: Cardiology

## 2019-02-19 NOTE — Telephone Encounter (Signed)
Spoke with wife, DPR on file.  Made her aware that we are waiting on final result from Dr. Gardiner Rhyme.  Advised we will call with that result as soon as he results it to [place].  Wife appreciative for call.

## 2019-02-19 NOTE — Telephone Encounter (Signed)
Wife of the patient calling to inquire about results from the patient's recent echo. Please call the wife or the patient to discuss

## 2019-02-22 NOTE — Telephone Encounter (Signed)
Pt wife (DPR) called [place] office requesting echo results - aware that Dr Gardiner Rhyme would need to dictate results before a final result could be given will forward

## 2019-02-22 NOTE — Telephone Encounter (Signed)
See result note.  

## 2019-03-08 DIAGNOSIS — Z79891 Long term (current) use of opiate analgesic: Secondary | ICD-10-CM | POA: Diagnosis not present

## 2019-03-08 DIAGNOSIS — G894 Chronic pain syndrome: Secondary | ICD-10-CM | POA: Diagnosis not present

## 2019-03-08 DIAGNOSIS — E1142 Type 2 diabetes mellitus with diabetic polyneuropathy: Secondary | ICD-10-CM | POA: Diagnosis not present

## 2019-03-09 ENCOUNTER — Other Ambulatory Visit: Payer: Self-pay

## 2019-03-09 ENCOUNTER — Ambulatory Visit
Admission: EM | Admit: 2019-03-09 | Discharge: 2019-03-09 | Disposition: A | Discharge status: Home / Self Care | Payer: PPO | Attending: Emergency Medicine | Admitting: Emergency Medicine

## 2019-03-09 DIAGNOSIS — J209 Acute bronchitis, unspecified: Secondary | ICD-10-CM

## 2019-03-09 MED ORDER — BENZONATATE 100 MG PO CAPS: 100.0000 mg | ORAL_CAPSULE | Freq: Three times a day (TID) | ORAL | 0 refills | Status: DC

## 2019-03-09 MED ORDER — PREDNISONE 10 MG PO TABS: 20.0000 mg | ORAL_TABLET | Freq: Every day | ORAL | 0 refills | Status: DC

## 2019-03-09 MED ORDER — AZITHROMYCIN 250 MG PO TABS: 250.0000 mg | ORAL_TABLET | Freq: Every day | ORAL | 0 refills | Status: DC

## 2019-03-09 NOTE — ED Triage Notes (Signed)
Pt presents with c/o cough that developed 2 weeks ago, pt states cough is worse at night

## 2019-03-09 NOTE — ED Provider Notes (Signed)
[facility]    CSN: [id number] Arrival date & time: 03/09/19  1448      History   Chief Complaint Chief Complaint  Patient presents with  . Cough    HPI Wayne Pratt is a 59 y.o. male.   Who is a smoker  presented to the urgent care for complaint of cough with thick yellow mucus for the past 2 weeks.  Denies sick exposure to COVID, flu or strep.  Denies recent travel.  Denies aggravating or alleviating symptoms.  Denies previous COVID infection.   Denies fever, chills, fatigue, nasal congestion, rhinorrhea, sore throat, SOB, wheezing, chest pain, nausea, vomiting, changes in bowel or bladder habits.    The history is provided by the patient. No language interpreter was used.    Past Medical History:  Diagnosis Date  . Anemia    takes Iron pill daily  . Anxiety    takes Xanax daily  . Arthritis   . Diabetes mellitus ([place])    takes Metformin daily  . History of bronchitis    mid 90's  . History of hypertension    lost weight and been off meds over a yr  . History of kidney stones   . History of pleural empyema   . Hypercholesterolemia    lost weight and been off meds over a yr  . Hypertension   . Insomnia    takes Trazodone nightly  . Kidney stones    22 times  . Neuropathy   . Pneumonia    hx of-15+ yrs ago  . Sleep apnea    Stop Bang score of 7  . Weakness    numbness and tingling    Patient Active Problem List   Diagnosis Date Noted  . Abdominal pain, epigastric 06/20/2017  . Dilation of pancreatic duct 06/20/2017  . S/P insertion of spinal cord stimulator 08/01/2015  . Rectal bleeding 07/17/2014  . Anemia 07/17/2014  . Fatty liver 09/07/2011  . Diabetes mellitus ([place]) 09/07/2011  . Encounter for screening colonoscopy 09/07/2011  . Chronic pain 09/07/2011    Past Surgical History:  Procedure Laterality Date  . BIOPSY  10/05/2011   Dr. Oneida Alar :non-erosive gastritis  . BIOPSY  08/06/2014   Procedure: BIOPSY;  Surgeon: Danie Binder,  MD;  Location: [facility];  Service: Endoscopy;;  . CERVICAL DISC SURGERY    . ELBOW SURGERY Right   . ESOPHAGOGASTRODUODENOSCOPY  Sept 2013   Dr. Oneida Alar: chronic inactive gastritis   . ESOPHAGOGASTRODUODENOSCOPY N/A 08/18/2017   Procedure: ESOPHAGOGASTRODUODENOSCOPY (EGD);  Surgeon: Milus Banister, MD;  Location: [facility];  Service: Endoscopy;  Laterality: N/A;  . ESOPHAGOGASTRODUODENOSCOPY N/A 03/09/2018   Procedure: ESOPHAGOGASTRODUODENOSCOPY (EGD);  Surgeon: Milus Banister, MD;  Location: [facility];  Service: Endoscopy;  Laterality: N/A;  . ESOPHAGOGASTRODUODENOSCOPY (EGD) WITH PROPOFOL N/A 08/06/2014   mild non-erosive gastritis, duodenitis  . EUS N/A 08/18/2017   Procedure: UPPER ENDOSCOPIC ULTRASOUND (EUS) RADIAL;  Surgeon: Milus Banister, MD;  Location: [facility];  Service: Endoscopy;  Laterality: N/A;  . EUS N/A 03/09/2018   Procedure: UPPER ENDOSCOPIC ULTRASOUND (EUS) RADIAL;  Surgeon: Milus Banister, MD;  Location: [facility];  Service: Endoscopy;  Laterality: N/A;  . FINE NEEDLE ASPIRATION  03/09/2018   Procedure: FINE NEEDLE ASPIRATION;  Surgeon: Milus Banister, MD;  Location: [facility];  Service: Endoscopy;;  . FLEXIBLE SIGMOIDOSCOPY  10/05/2011   Dr. Fields:poor prep  . LUNG SURGERY  2010   for empyema  . SPINAL  CORD STIMULATOR INSERTION N/A 08/01/2015   Procedure: Spinal Cord Stimulator Implant;  Surgeon: Eustace Moore, MD;  Location: [facility];  Service: Neurosurgery;  Laterality: N/A;  . TONSILLECTOMY    . VASECTOMY Bilateral 1995       Home Medications    Prior to Admission medications   Medication Sig Start Date End Date Taking? Authorizing Provider  azithromycin (ZITHROMAX) 250 MG tablet Take 1 tablet (250 mg total) by mouth daily. Take first 2 tablets together, then 1 every day until finished. 03/09/19   Takoda Janowiak, Darrelyn Hillock, FNP  benzonatate (TESSALON) 100 MG capsule Take 1 capsule (100 mg total) by mouth every 8 (eight) hours. 03/09/19   Caydn Justen,  Darrelyn Hillock, FNP  clonazePAM (KLONOPIN) 0.5 MG tablet Take 0.5 mg by mouth 2 (two) times daily.  06/17/17   [provider]  cyclobenzaprine (FLEXERIL) 10 MG tablet Take 10 mg by mouth 3 (three) times daily as needed for muscle spasms.  04/14/17   [provider]  GLIPIZIDE XL 10 MG 24 hr tablet Take 10 mg by mouth 2 (two) times daily. 01/17/19   [provider]  HYDROmorphone (DILAUDID) 4 MG tablet Take 1 tablet (4 mg total) by mouth every 6 (six) hours as needed for severe pain. 02/17/18   Milton Ferguson, MD  IBU 800 MG tablet Take 800 mg by mouth daily as needed for headache.  01/20/18   [provider]  lisinopril (ZESTRIL) 10 MG tablet Take 1 tablet (10 mg total) by mouth daily. 01/22/19   Donato Heinz, MD  lovastatin (MEVACOR) 40 MG tablet Take 40 mg by mouth at bedtime.  04/01/17   [provider]  metFORMIN (GLUCOPHAGE) 1000 MG tablet Take 1,000 mg by mouth 2 (two) times daily.  04/01/17   [provider]  metoprolol tartrate (LOPRESSOR) 100 MG tablet TAKE 1 TABLET 2 HR PRIOR TO CARDIAC PROCEDURE 01/22/19   Donato Heinz, MD  morphine (MSIR) 15 MG tablet Take 1 tablet by mouth every 4 (four) hours as needed for pain. 11/14/18   Nicholaus Bloom, MD  naproxen sodium (ALEVE) 220 MG tablet Take 440 mg by mouth daily as needed (for pain or headache).    [provider]  omeprazole (PRILOSEC) 20 MG capsule TAKE 1 CAPSULE ONCE DAILY 30 MINUTES BEFORE YOUR FIRST MEAL. Patient taking differently: Take 20 mg by mouth daily.  12/29/18   Annitta Needs, NP  ondansetron (ZOFRAN ODT) 4 MG disintegrating tablet 4mg  ODT q4 hours prn nausea/vomit 02/17/18   Milton Ferguson, MD  ondansetron (ZOFRAN) 8 MG tablet Take 1 tablet (8 mg total) by mouth every 4 (four) hours as needed. 01/18/19   Nat Christen, MD  Oxycodone HCl 10 MG TABS Take 10 mg by mouth every 4 (four) hours.     [provider]  oxyCODONE-acetaminophen (PERCOCET) 5-325 MG  tablet Take 1 tablet by mouth every 4 (four) hours as needed. 01/18/19   Nat Christen, MD  pantoprazole (PROTONIX) 40 MG tablet TAKE 1 TABLET BY MOUTH 30 MINUTES PRIOR TO BREAKFAST. Patient taking differently: Take 40 mg by mouth daily.  02/15/18   Annitta Needs, NP  pioglitazone (ACTOS) 15 MG tablet Take 15 mg by mouth daily. 01/17/19   [provider]  predniSONE (DELTASONE) 10 MG tablet Take 2 tablets (20 mg total) by mouth daily. 03/09/19   Emerson Monte, FNP    Family History Family History  Problem Relation Age of Onset  . Prostate cancer  Other        all male members on mother's side  . Heart attack Father   . COPD Maternal Grandmother   . Cancer Paternal Grandmother   . Colon cancer Neg Hx     Social History Social History   Tobacco Use  . Smoking status: Current Every Day Smoker    Packs/day: 1.00    Years: 39.00    Pack years: 39.00    Types: Cigarettes  . Smokeless tobacco: Never Used  Substance Use Topics  . Alcohol use: No    Alcohol/week: 0.0 standard drinks  . Drug use: No     Allergies   Penicillins and Ativan [lorazepam]   Review of Systems Review of Systems  Constitutional: Negative.   HENT: Negative.   Respiratory: Positive for cough.   Cardiovascular: Negative.   Gastrointestinal: Negative.   Neurological: Negative.   All other systems reviewed and are negative.    Physical Exam Triage Vital Signs ED Triage Vitals  Enc Vitals Group     BP      Pulse      Resp      Temp      Temp src      SpO2      Weight      Height      Head Circumference      Peak Flow      Pain Score      Pain Loc      Pain Edu?      Excl. in [place]?    No data found.  Updated Vital Signs BP 133/84   Pulse 84   Temp 98.1 F (36.7 C)   Resp 18   SpO2 92%   Visual Acuity Right Eye Distance:   Left Eye Distance:   Bilateral Distance:    Right Eye Near:   Left Eye Near:    Bilateral Near:     Physical Exam Vitals and nursing note reviewed.    Constitutional:      General: He is not in acute distress.    Appearance: Normal appearance. He is normal weight. He is not ill-appearing or toxic-appearing.  HENT:     Head: Normocephalic.     Right Ear: Tympanic membrane, ear canal and external ear normal. There is no impacted cerumen.     Left Ear: Tympanic membrane, ear canal and external ear normal. There is no impacted cerumen.     Nose: Nose normal. No congestion.     Mouth/Throat:     Mouth: Mucous membranes are moist.     Pharynx: Oropharynx is clear. No oropharyngeal exudate or posterior oropharyngeal erythema.  Cardiovascular:     Rate and Rhythm: Normal rate and regular rhythm.     Pulses: Normal pulses.     Heart sounds: Normal heart sounds. No murmur.  Pulmonary:     Effort: Pulmonary effort is normal. No respiratory distress.     Breath sounds: Normal breath sounds. No wheezing or rhonchi.  Chest:     Chest wall: No tenderness.  Neurological:     Mental Status: He is alert and oriented to person, place, and time.      UC Treatments / Results  Labs (all labs ordered are listed, but only abnormal results are displayed) Labs Reviewed - No data to display  EKG   Radiology No results found.  Procedures Procedures (including critical care time)  Medications Ordered in UC Medications - No data to display  Initial Impression /  Assessment and Plan / UC Course  I have reviewed the triage vital signs and the nursing notes.  Pertinent labs & imaging results that were available during my care of the patient were reviewed by me and considered in my medical decision making (see chart for details).    Patient is stable for discharge. Patient will be treated for acute bronchitis Azithromycin was prescribed Prednisone as prescribed Tessalon Perles was prescribed To go to ED for worsening of symptoms  Final Clinical Impressions(s) / UC Diagnoses   Final diagnoses:  Acute bronchitis, unspecified organism      Discharge Instructions     Get rest, and drink fluids Continue to use albuterol inhaler as prescribed Prescribed tessalone perles as needed for cough.  Azithromycin prescribed take as directed and to completion Prednisone was prescribed Use throat lozenges, hot tea, honey, and avoid second-hand smoke     ED Prescriptions    Medication Sig Dispense Auth. Provider   benzonatate (TESSALON) 100 MG capsule Take 1 capsule (100 mg total) by mouth every 8 (eight) hours. 21 capsule Alexandra Posadas S, FNP   azithromycin (ZITHROMAX) 250 MG tablet Take 1 tablet (250 mg total) by mouth daily. Take first 2 tablets together, then 1 every day until finished. 6 tablet Kaileena Obi S, FNP   predniSONE (DELTASONE) 10 MG tablet Take 2 tablets (20 mg total) by mouth daily. 15 tablet Celia Friedland, Darrelyn Hillock, FNP     PDMP not reviewed this encounter.   Emerson Monte, [place] 03/09/19 1511

## 2019-03-09 NOTE — Discharge Instructions (Addendum)
Get rest, and drink fluids Continue to use albuterol inhaler as prescribed Prescribed tessalone perles as needed for cough.  Azithromycin prescribed take as directed and to completion Prednisone was prescribed Use throat lozenges, hot tea, honey, and avoid second-hand smoke

## 2019-03-15 ENCOUNTER — Other Ambulatory Visit: Payer: Self-pay

## 2019-03-15 ENCOUNTER — Ambulatory Visit (INDEPENDENT_AMBULATORY_CARE_PROVIDER_SITE_OTHER): Payer: PPO

## 2019-03-15 ENCOUNTER — Ambulatory Visit
Admission: EM | Admit: 2019-03-15 | Discharge: 2019-03-15 | Disposition: A | Discharge status: Home / Self Care | Payer: PPO | Attending: Emergency Medicine | Admitting: Emergency Medicine

## 2019-03-15 ENCOUNTER — Ambulatory Visit: Payer: PPO

## 2019-03-15 DIAGNOSIS — R059 Cough, unspecified: Secondary | ICD-10-CM

## 2019-03-15 DIAGNOSIS — R0602 Shortness of breath: Secondary | ICD-10-CM | POA: Diagnosis not present

## 2019-03-15 DIAGNOSIS — R05 Cough: Secondary | ICD-10-CM | POA: Diagnosis not present

## 2019-03-15 MED ORDER — LEVOFLOXACIN 750 MG PO TABS: 750.0000 mg | ORAL_TABLET | Freq: Every day | ORAL | 0 refills | Status: AC

## 2019-03-15 MED ORDER — ALBUTEROL SULFATE HFA 108 (90 BASE) MCG/ACT IN AERS: 1.0000 | INHALATION_SPRAY | Freq: Four times a day (QID) | RESPIRATORY_TRACT | 0 refills | Status: DC | PRN

## 2019-03-15 MED ORDER — BENZONATATE 100 MG PO CAPS: 100.0000 mg | ORAL_CAPSULE | Freq: Three times a day (TID) | ORAL | 0 refills | Status: DC

## 2019-03-15 NOTE — ED Provider Notes (Addendum)
[facility]    CSN: [id number] Arrival date & time: 03/15/19  1020      History   Chief Complaint Chief Complaint  Patient presents with  . Cough  . Shortness of Breath    HPI Wayne Pratt is a 59 y.o. male.   Who presented to the urgent care with a complaint of worsening shortness of breath and cough for the past 7 days.  Denies sick exposure to COVID, flu or strep.  Was seen and was prescribed Prednisone, Azithromycin and Tessalon Perles with no relief of symptoms.  Was unable to complete prednisone course due to high blood sugar.  Denies recent travel.  Denies aggravating or alleviating symptoms.  Denies previous COVID infection.   Denies fever, chills, fatigue, nasal congestion, rhinorrhea, sore throat, wheezing, chest pain, nausea, vomiting, changes in bowel or bladder habits.       Past Medical History:  Diagnosis Date  . Anemia    takes Iron pill daily  . Anxiety    takes Xanax daily  . Arthritis   . Diabetes mellitus ([place])    takes Metformin daily  . History of bronchitis    mid 90's  . History of hypertension    lost weight and been off meds over a yr  . History of kidney stones   . History of pleural empyema   . Hypercholesterolemia    lost weight and been off meds over a yr  . Hypertension   . Insomnia    takes Trazodone nightly  . Kidney stones    22 times  . Neuropathy   . Pneumonia    hx of-15+ yrs ago  . Sleep apnea    Stop Bang score of 7  . Weakness    numbness and tingling    Patient Active Problem List   Diagnosis Date Noted  . Abdominal pain, epigastric 06/20/2017  . Dilation of pancreatic duct 06/20/2017  . S/P insertion of spinal cord stimulator 08/01/2015  . Rectal bleeding 07/17/2014  . Anemia 07/17/2014  . Fatty liver 09/07/2011  . Diabetes mellitus ([place]) 09/07/2011  . Encounter for screening colonoscopy 09/07/2011  . Chronic pain 09/07/2011    Past Surgical History:  Procedure Laterality Date  . BIOPSY   10/05/2011   Dr. Oneida Alar :non-erosive gastritis  . BIOPSY  08/06/2014   Procedure: BIOPSY;  Surgeon: Danie Binder, MD;  Location: [facility];  Service: Endoscopy;;  . CERVICAL DISC SURGERY    . ELBOW SURGERY Right   . ESOPHAGOGASTRODUODENOSCOPY  Sept 2013   Dr. Oneida Alar: chronic inactive gastritis   . ESOPHAGOGASTRODUODENOSCOPY N/A 08/18/2017   Procedure: ESOPHAGOGASTRODUODENOSCOPY (EGD);  Surgeon: Milus Banister, MD;  Location: [facility];  Service: Endoscopy;  Laterality: N/A;  . ESOPHAGOGASTRODUODENOSCOPY N/A 03/09/2018   Procedure: ESOPHAGOGASTRODUODENOSCOPY (EGD);  Surgeon: Milus Banister, MD;  Location: [facility];  Service: Endoscopy;  Laterality: N/A;  . ESOPHAGOGASTRODUODENOSCOPY (EGD) WITH PROPOFOL N/A 08/06/2014   mild non-erosive gastritis, duodenitis  . EUS N/A 08/18/2017   Procedure: UPPER ENDOSCOPIC ULTRASOUND (EUS) RADIAL;  Surgeon: Milus Banister, MD;  Location: [facility];  Service: Endoscopy;  Laterality: N/A;  . EUS N/A 03/09/2018   Procedure: UPPER ENDOSCOPIC ULTRASOUND (EUS) RADIAL;  Surgeon: Milus Banister, MD;  Location: [facility];  Service: Endoscopy;  Laterality: N/A;  . FINE NEEDLE ASPIRATION  03/09/2018   Procedure: FINE NEEDLE ASPIRATION;  Surgeon: Milus Banister, MD;  Location[facility];  Service: Endoscopy;;  . Otho Darner SIGMOIDOSCOPY  10/05/2011  Dr. Fields:poor prep  . LUNG SURGERY  2010   for empyema  . SPINAL CORD STIMULATOR INSERTION N/A 08/01/2015   Procedure: Spinal Cord Stimulator Implant;  Surgeon: Eustace Moore, MD;  Location: [facility];  Service: Neurosurgery;  Laterality: N/A;  . TONSILLECTOMY    . VASECTOMY Bilateral 1995       Home Medications    Prior to Admission medications   Medication Sig Start Date End Date Taking? Authorizing Provider  albuterol (VENTOLIN HFA) 108 (90 Base) MCG/ACT inhaler Inhale 1-2 puffs into the lungs every 6 (six) hours as needed for wheezing or shortness of breath. 03/15/19   Kathreen Dileo, Darrelyn Hillock, FNP    azithromycin (ZITHROMAX) 250 MG tablet Take 1 tablet (250 mg total) by mouth daily. Take first 2 tablets together, then 1 every day until finished. 03/09/19   Emiline Mancebo, Darrelyn Hillock, FNP  benzonatate (TESSALON) 100 MG capsule Take 1 capsule (100 mg total) by mouth every 8 (eight) hours. 03/15/19   Laytoya Ion, Darrelyn Hillock, FNP  clonazePAM (KLONOPIN) 0.5 MG tablet Take 0.5 mg by mouth 2 (two) times daily.  06/17/17   [provider]  cyclobenzaprine (FLEXERIL) 10 MG tablet Take 10 mg by mouth 3 (three) times daily as needed for muscle spasms.  04/14/17   [provider]  GLIPIZIDE XL 10 MG 24 hr tablet Take 10 mg by mouth 2 (two) times daily. 01/17/19   [provider]  HYDROmorphone (DILAUDID) 4 MG tablet Take 1 tablet (4 mg total) by mouth every 6 (six) hours as needed for severe pain. 02/17/18   Milton Ferguson, MD  IBU 800 MG tablet Take 800 mg by mouth daily as needed for headache.  01/20/18   [provider]  levofloxacin (LEVAQUIN) 750 MG tablet Take 1 tablet (750 mg total) by mouth daily for 5 days. 03/15/19 03/20/19  Mylez Venable, Darrelyn Hillock, FNP  lisinopril (ZESTRIL) 10 MG tablet Take 1 tablet (10 mg total) by mouth daily. 01/22/19   Donato Heinz, MD  lovastatin (MEVACOR) 40 MG tablet Take 40 mg by mouth at bedtime.  04/01/17   [provider]  metFORMIN (GLUCOPHAGE) 1000 MG tablet Take 1,000 mg by mouth 2 (two) times daily.  04/01/17   [provider]  metoprolol tartrate (LOPRESSOR) 100 MG tablet TAKE 1 TABLET 2 HR PRIOR TO CARDIAC PROCEDURE 01/22/19   Donato Heinz, MD  morphine (MSIR) 15 MG tablet Take 1 tablet by mouth every 4 (four) hours as needed for pain. 11/14/18   Nicholaus Bloom, MD  naproxen sodium (ALEVE) 220 MG tablet Take 440 mg by mouth daily as needed (for pain or headache).    [provider]  omeprazole (PRILOSEC) 20 MG capsule TAKE 1 CAPSULE ONCE DAILY 30 MINUTES BEFORE YOUR FIRST MEAL. Patient taking differently: Take 20 mg  by mouth daily.  12/29/18   Annitta Needs, NP  ondansetron (ZOFRAN ODT) 4 MG disintegrating tablet 4mg  ODT q4 hours prn nausea/vomit 02/17/18   Milton Ferguson, MD  ondansetron (ZOFRAN) 8 MG tablet Take 1 tablet (8 mg total) by mouth every 4 (four) hours as needed. 01/18/19   Nat Christen, MD  Oxycodone HCl 10 MG TABS Take 10 mg by mouth every 4 (four) hours.     [provider]  oxyCODONE-acetaminophen (PERCOCET) 5-325 MG tablet Take 1 tablet by mouth every 4 (four) hours as needed. 01/18/19   Nat Christen, MD  pantoprazole (PROTONIX) 40 MG tablet TAKE 1 TABLET BY MOUTH 30 MINUTES PRIOR  TO BREAKFAST. Patient taking differently: Take 40 mg by mouth daily.  02/15/18   Annitta Needs, NP  pioglitazone (ACTOS) 15 MG tablet Take 15 mg by mouth daily. 01/17/19   [provider]  predniSONE (DELTASONE) 10 MG tablet Take 2 tablets (20 mg total) by mouth daily. 03/09/19   Emerson Monte, FNP    Family History Family History  Problem Relation Age of Onset  . Prostate cancer Other        all male members on mother's side  . Heart attack Father   . COPD Maternal Grandmother   . Cancer Paternal Grandmother   . Colon cancer Neg Hx     Social History Social History   Tobacco Use  . Smoking status: Current Every Day Smoker    Packs/day: 1.00    Years: 39.00    Pack years: 39.00    Types: Cigarettes  . Smokeless tobacco: Never Used  Substance Use Topics  . Alcohol use: No    Alcohol/week: 0.0 standard drinks  . Drug use: No     Allergies   Penicillins and Ativan [lorazepam]   Review of Systems Review of Systems  Constitutional: Negative.   HENT: Negative.   Respiratory: Positive for cough and shortness of breath.   Cardiovascular: Negative.   Gastrointestinal: Negative.   Neurological: Negative.   All other systems reviewed and are negative.    Physical Exam Triage Vital Signs ED Triage Vitals  Enc Vitals Group     BP      Pulse      Resp      Temp      Temp src       SpO2      Weight      Height      Head Circumference      Peak Flow      Pain Score      Pain Loc      Pain Edu?      Excl. in [place]?    No data found.  Updated Vital Signs BP 128/81 (BP Location: Right Arm)   Pulse (!) 113   Temp 98 F (36.7 C) (Oral)   Resp 17   SpO2 98%   Visual Acuity Right Eye Distance:   Left Eye Distance:   Bilateral Distance:    Right Eye Near:   Left Eye Near:    Bilateral Near:     Physical Exam Vitals and nursing note reviewed.  Constitutional:      General: He is not in acute distress.    Appearance: Normal appearance. He is normal weight. He is not ill-appearing or toxic-appearing.  HENT:     Head: Normocephalic.     Right Ear: Tympanic membrane, ear canal and external ear normal. There is no impacted cerumen.     Left Ear: Tympanic membrane, ear canal and external ear normal. There is no impacted cerumen.     Nose: Nose normal. No congestion.     Mouth/Throat:     Mouth: Mucous membranes are moist.     Pharynx: Oropharynx is clear. No oropharyngeal exudate or posterior oropharyngeal erythema.  Cardiovascular:     Rate and Rhythm: Regular rhythm. Tachycardia present.     Pulses: Normal pulses.     Heart sounds: Normal heart sounds. No murmur.  Pulmonary:     Effort: Pulmonary effort is normal. No respiratory distress.     Breath sounds: Normal breath sounds. No stridor. No decreased breath sounds,  wheezing, rhonchi or rales.  Chest:     Chest wall: No tenderness.  Skin:    General: Skin is warm.  Neurological:     Mental Status: He is alert and oriented to person, place, and time.      UC Treatments / Results  Labs (all labs ordered are listed, but only abnormal results are displayed) Labs Reviewed - No data to display  EKG   Radiology DG Chest 2 View  Result Date: 03/15/2019 CLINICAL DATA:  Cough.  Shortness of breath. EXAM: CHEST - 2 VIEW COMPARISON:  CT chest 01/18/2019. Chest x-ray 01/18/2019, 05/30/2017.  FINDINGS: Mediastinum and hilar structures normal. Low lung volumes. Mild multifocal bilateral pulmonary infiltrates cannot be excluded. Very tiny right pleural effusion cannot be excluded. No pneumothorax. Degenerative changes scoliosis thoracic spine. Prior cervicothoracic spine fusion. Neurostimulator noted in stable position. IMPRESSION: Low lung volumes. Mild multifocal bilateral pulmonary infiltrates cannot be excluded. Tiny right pleural effusion cannot be excluded Electronically Signed   By: Marcello Moores  Register   On: 03/15/2019 10:52    Procedures Procedures (including critical care time)  Medications Ordered in UC Medications - No data to display  Initial Impression / Assessment and Plan / UC Course  I have reviewed the triage vital signs and the nursing notes.  Pertinent labs & imaging results that were available during my care of the patient were reviewed by me and considered in my medical decision making (see chart for details).     Chest x-ray results suggested  multifocal bilateral pulmonary infiltrates and pleural effusion cannot be excluded.  I have reviewed the x-ray myself and the radiologist interpretation.  I am in agreement with the radiologist interpretation.  Patient was advised to go to Emergency Department for further evaluation and rule out sepsis and PE.  Patient declined.   Levofloxacin was prescribed for possible pneumonia as pulmonary infiltrate cannot be ruled out ProAir was prescribed for shortness of breath Tessalon Perles was prescribed for cough Advised wife to take patient to the ED  Final Clinical Impressions(s) / UC Diagnoses   Final diagnoses:  Cough  SOB (shortness of breath)     Discharge Instructions     Patient was advised to go to ED for further evaluation     ED Prescriptions    Medication Sig Dispense Auth. Provider   levofloxacin (LEVAQUIN) 750 MG tablet Take 1 tablet (750 mg total) by mouth daily for 5 days. 5 tablet Marteze Vecchio,  Darrelyn Hillock, FNP   albuterol (VENTOLIN HFA) 108 (90 Base) MCG/ACT inhaler Inhale 1-2 puffs into the lungs every 6 (six) hours as needed for wheezing or shortness of breath. 18 g Rosalita Carey, Darrelyn Hillock, FNP   benzonatate (TESSALON) 100 MG capsule Take 1 capsule (100 mg total) by mouth every 8 (eight) hours. 60 capsule Voris Tigert, Darrelyn Hillock, FNP     I have reviewed the PDMP during this encounter.  Pilar Grammes, DNP, FNP-C      Emerson Monte, [place] 03/15/19 1155

## 2019-03-15 NOTE — ED Triage Notes (Signed)
Pt presents to UC for worsening cough and congestion x1 week. Current smoker (1 pack day). denies fever. had to stop prednisone due to blood sugar elevation to 600.

## 2019-03-15 NOTE — Discharge Instructions (Addendum)
Patient was advised to go to ED for further evaluation 

## 2019-04-11 ENCOUNTER — Ambulatory Visit: Payer: PPO | Admitting: Cardiology

## 2019-04-13 ENCOUNTER — Ambulatory Visit: Payer: PPO | Attending: Internal Medicine

## 2019-04-13 DIAGNOSIS — Z23 Encounter for immunization: Secondary | ICD-10-CM

## 2019-04-13 NOTE — Progress Notes (Signed)
   Covid-19 Vaccination Clinic  Name:  Wayne Pratt    MRN: [id number] DOB: 12-02-1960  04/13/2019  [name] was observed post Covid-19 immunization for 30 minutes based on pre-vaccination screening without incident. He was provided with Vaccine Information Sheet and instruction to access the V-Safe system.   [name] was instructed to call 911 with any severe reactions post vaccine: [name] Kitchen Difficulty breathing  . Swelling of face and throat  . A fast heartbeat  . A bad rash all over body  . Dizziness and weakness   Immunizations Administered    Name Date Dose VIS Date Route   Moderna COVID-19 Vaccine 04/13/2019 12:02 PM 0.5 mL 12/12/2018 Intramuscular   Manufacturer: Moderna   Lot: [id number]   [id number]

## 2019-04-26 ENCOUNTER — Ambulatory Visit (INDEPENDENT_AMBULATORY_CARE_PROVIDER_SITE_OTHER): Payer: PPO

## 2019-04-26 ENCOUNTER — Ambulatory Visit
Admission: EM | Admit: 2019-04-26 | Discharge: 2019-04-26 | Disposition: A | Discharge status: Home / Self Care | Payer: PPO | Attending: Emergency Medicine | Admitting: Emergency Medicine

## 2019-04-26 ENCOUNTER — Other Ambulatory Visit: Payer: Self-pay

## 2019-04-26 DIAGNOSIS — R200 Anesthesia of skin: Secondary | ICD-10-CM

## 2019-04-26 DIAGNOSIS — M79645 Pain in left finger(s): Secondary | ICD-10-CM | POA: Diagnosis not present

## 2019-04-26 DIAGNOSIS — S6992XA Unspecified injury of left wrist, hand and finger(s), initial encounter: Secondary | ICD-10-CM | POA: Diagnosis not present

## 2019-04-26 NOTE — ED Triage Notes (Signed)
correction left first finger

## 2019-04-26 NOTE — ED Provider Notes (Signed)
[facility]    CSN: [id number] Arrival date & time: 04/26/19  1441      History   Chief Complaint Chief Complaint  Patient presents with  . Hand Injury    HPI Wayne Pratt is a 59 y.o. male.   Who presented to the urgent care with a complaint of tip of left hand little finger pain for the past 1 week.  It started after a  car jack fall on his little finger while he was repairing his car.  He localizes the pain to the left middle finger.  He describes the pain as constant and achy.  He has tried OTC medications without relief.  His symptoms are made worse with ROM.  He denies similar symptoms in the past.  Denies nausea, vomiting, diarrhea.  The history is provided by the patient. No language interpreter was used.  Hand Injury   Past Medical History:  Diagnosis Date  . Anemia    takes Iron pill daily  . Anxiety    takes Xanax daily  . Arthritis   . Diabetes mellitus ([place])    takes Metformin daily  . History of bronchitis    mid 90's  . History of hypertension    lost weight and been off meds over a yr  . History of kidney stones   . History of pleural empyema   . Hypercholesterolemia    lost weight and been off meds over a yr  . Hypertension   . Insomnia    takes Trazodone nightly  . Kidney stones    22 times  . Neuropathy   . Pneumonia    hx of-15+ yrs ago  . Sleep apnea    Stop Bang score of 7  . Weakness    numbness and tingling    Patient Active Problem List   Diagnosis Date Noted  . Abdominal pain, epigastric 06/20/2017  . Dilation of pancreatic duct 06/20/2017  . S/P insertion of spinal cord stimulator 08/01/2015  . Rectal bleeding 07/17/2014  . Anemia 07/17/2014  . Fatty liver 09/07/2011  . Diabetes mellitus ([place]) 09/07/2011  . Encounter for screening colonoscopy 09/07/2011  . Chronic pain 09/07/2011    Past Surgical History:  Procedure Laterality Date  . BIOPSY  10/05/2011   Dr. Oneida Alar :non-erosive gastritis  . BIOPSY   08/06/2014   Procedure: BIOPSY;  Surgeon: Danie Binder, MD;  Location: [facility];  Service: Endoscopy;;  . CERVICAL DISC SURGERY    . ELBOW SURGERY Right   . ESOPHAGOGASTRODUODENOSCOPY  Sept 2013   Dr. Oneida Alar: chronic inactive gastritis   . ESOPHAGOGASTRODUODENOSCOPY N/A 08/18/2017   Procedure: ESOPHAGOGASTRODUODENOSCOPY (EGD);  Surgeon: Milus Banister, MD;  Location: [facility];  Service: Endoscopy;  Laterality: N/A;  . ESOPHAGOGASTRODUODENOSCOPY N/A 03/09/2018   Procedure: ESOPHAGOGASTRODUODENOSCOPY (EGD);  Surgeon: Milus Banister, MD;  Location: [facility];  Service: Endoscopy;  Laterality: N/A;  . ESOPHAGOGASTRODUODENOSCOPY (EGD) WITH PROPOFOL N/A 08/06/2014   mild non-erosive gastritis, duodenitis  . EUS N/A 08/18/2017   Procedure: UPPER ENDOSCOPIC ULTRASOUND (EUS) RADIAL;  Surgeon: Milus Banister, MD;  Location: [facility];  Service: Endoscopy;  Laterality: N/A;  . EUS N/A 03/09/2018   Procedure: UPPER ENDOSCOPIC ULTRASOUND (EUS) RADIAL;  Surgeon: Milus Banister, MD;  Location: [facility];  Service: Endoscopy;  Laterality: N/A;  . FINE NEEDLE ASPIRATION  03/09/2018   Procedure: FINE NEEDLE ASPIRATION;  Surgeon: Milus Banister, MD;  Location: [facility];  Service: Endoscopy;;  . FLEXIBLE SIGMOIDOSCOPY  10/05/2011   Dr. Fields:poor prep  . LUNG SURGERY  2010   for empyema  . SPINAL CORD STIMULATOR INSERTION N/A 08/01/2015   Procedure: Spinal Cord Stimulator Implant;  Surgeon: Eustace Moore, MD;  Location: [facility];  Service: Neurosurgery;  Laterality: N/A;  . TONSILLECTOMY    . VASECTOMY Bilateral 1995       Home Medications    Prior to Admission medications   Medication Sig Start Date End Date Taking? Authorizing Provider  albuterol (VENTOLIN HFA) 108 (90 Base) MCG/ACT inhaler Inhale 1-2 puffs into the lungs every 6 (six) hours as needed for wheezing or shortness of breath. 03/15/19   Tamarion Haymond, Darrelyn Hillock, FNP  azithromycin (ZITHROMAX) 250 MG tablet Take 1 tablet (250  mg total) by mouth daily. Take first 2 tablets together, then 1 every day until finished. 03/09/19   Lorraine Terriquez, Darrelyn Hillock, FNP  benzonatate (TESSALON) 100 MG capsule Take 1 capsule (100 mg total) by mouth every 8 (eight) hours. 03/15/19   Shamera Yarberry, Darrelyn Hillock, FNP  clonazePAM (KLONOPIN) 0.5 MG tablet Take 0.5 mg by mouth 2 (two) times daily.  06/17/17   [provider]  cyclobenzaprine (FLEXERIL) 10 MG tablet Take 10 mg by mouth 3 (three) times daily as needed for muscle spasms.  04/14/17   [provider]  GLIPIZIDE XL 10 MG 24 hr tablet Take 10 mg by mouth 2 (two) times daily. 01/17/19   [provider]  HYDROmorphone (DILAUDID) 4 MG tablet Take 1 tablet (4 mg total) by mouth every 6 (six) hours as needed for severe pain. 02/17/18   Milton Ferguson, MD  IBU 800 MG tablet Take 800 mg by mouth daily as needed for headache.  01/20/18   [provider]  lisinopril (ZESTRIL) 10 MG tablet Take 1 tablet (10 mg total) by mouth daily. 01/22/19   Donato Heinz, MD  lovastatin (MEVACOR) 40 MG tablet Take 40 mg by mouth at bedtime.  04/01/17   [provider]  metFORMIN (GLUCOPHAGE) 1000 MG tablet Take 1,000 mg by mouth 2 (two) times daily.  04/01/17   [provider]  metoprolol tartrate (LOPRESSOR) 100 MG tablet TAKE 1 TABLET 2 HR PRIOR TO CARDIAC PROCEDURE 01/22/19   Donato Heinz, MD  morphine (MSIR) 15 MG tablet Take 1 tablet by mouth every 4 (four) hours as needed for pain. 11/14/18   Nicholaus Bloom, MD  naproxen sodium (ALEVE) 220 MG tablet Take 440 mg by mouth daily as needed (for pain or headache).    [provider]  omeprazole (PRILOSEC) 20 MG capsule TAKE 1 CAPSULE ONCE DAILY 30 MINUTES BEFORE YOUR FIRST MEAL. Patient taking differently: Take 20 mg by mouth daily.  12/29/18   Annitta Needs, NP  ondansetron (ZOFRAN ODT) 4 MG disintegrating tablet 4mg  ODT q4 hours prn nausea/vomit 02/17/18   Milton Ferguson, MD  ondansetron (ZOFRAN) 8 MG  tablet Take 1 tablet (8 mg total) by mouth every 4 (four) hours as needed. 01/18/19   Nat Christen, MD  Oxycodone HCl 10 MG TABS Take 10 mg by mouth every 4 (four) hours.     [provider]  oxyCODONE-acetaminophen (PERCOCET) 5-325 MG tablet Take 1 tablet by mouth every 4 (four) hours as needed. 01/18/19   Nat Christen, MD  pantoprazole (PROTONIX) 40 MG tablet TAKE 1 TABLET BY MOUTH 30 MINUTES PRIOR TO BREAKFAST. Patient taking differently: Take 40 mg by mouth daily.  02/15/18   Annitta Needs, NP  pioglitazone (ACTOS) 15  MG tablet Take 15 mg by mouth daily. 01/17/19   [provider]  predniSONE (DELTASONE) 10 MG tablet Take 2 tablets (20 mg total) by mouth daily. 03/09/19   Emerson Monte, FNP    Family History Family History  Problem Relation Age of Onset  . Prostate cancer Other        all male members on mother's side  . Heart attack Father   . COPD Maternal Grandmother   . Cancer Paternal Grandmother   . Colon cancer Neg Hx     Social History Social History   Tobacco Use  . Smoking status: Current Every Day Smoker    Packs/day: 1.00    Years: 39.00    Pack years: 39.00    Types: Cigarettes  . Smokeless tobacco: Never Used  Substance Use Topics  . Alcohol use: No    Alcohol/week: 0.0 standard drinks  . Drug use: No     Allergies   Penicillins and Ativan [lorazepam]   Review of Systems Review of Systems  Constitutional: Negative.   Respiratory: Negative.   Cardiovascular: Negative.   Musculoskeletal: Positive for arthralgias. Negative for joint swelling.  Skin: Positive for color change.  Neurological: Positive for numbness.  All other systems reviewed and are negative.    Physical Exam Triage Vital Signs ED Triage Vitals  Enc Vitals Group     BP      Pulse      Resp      Temp      Temp src      SpO2      Weight      Height      Head Circumference      Peak Flow      Pain Score      Pain Loc      Pain Edu?      Excl. in [place]?    No  data found.  Updated Vital Signs BP 124/76   Pulse 91   Temp 98.1 F (36.7 C)   Resp 18   SpO2 95%   Visual Acuity Right Eye Distance:   Left Eye Distance:   Bilateral Distance:    Right Eye Near:   Left Eye Near:    Bilateral Near:     Physical Exam Vitals and nursing note reviewed.  Constitutional:      General: He is not in acute distress.    Appearance: Normal appearance. He is normal weight. He is not ill-appearing, toxic-appearing or diaphoretic.  Cardiovascular:     Rate and Rhythm: Normal rate and regular rhythm.     Pulses: Normal pulses.     Heart sounds: Normal heart sounds. No murmur. No friction rub. No gallop.   Pulmonary:     Effort: Pulmonary effort is normal. No respiratory distress.     Breath sounds: Normal breath sounds. No stridor. No wheezing, rhonchi or rales.  Chest:     Chest wall: No tenderness.  Musculoskeletal:        General: Tenderness and signs of injury present. No deformity. Normal range of motion.     Right hand: Normal.     Left hand: Tenderness present. Decreased capillary refill.     Right lower leg: No edema.     Left lower leg: No edema.  Skin:    General: Skin is warm.     Capillary Refill: Capillary refill takes more than 3 seconds.     Comments: Right hand is without any obvious asymmetry  or deformity when compared to the left flank.  Left little finger: no swelling atrophy or obvious deformity present.  Left little finger is purple with decreased capillary refill.  Pain on range of motion.  Decreased sensory at the tip of finger.  Neurological:     Mental Status: He is alert and oriented to person, place, and time.     Cranial Nerves: Cranial nerves are intact.     Sensory: Sensory deficit present.     Motor: Motor function is intact. No weakness.     Coordination: Coordination is intact.      UC Treatments / Results  Labs (all labs ordered are listed, but only abnormal results are displayed) Labs Reviewed - No data to  display  EKG   Radiology DG Hand Complete Left  Result Date: 04/26/2019 CLINICAL DATA:  Blunt trauma to the left hand, initial encounter EXAM: LEFT HAND - COMPLETE 3+ VIEW COMPARISON:  None. FINDINGS: No acute fracture or dislocation is noted. No gross soft tissue abnormality is seen. IMPRESSION: No acute abnormality noted. Electronically Signed   By: Inez Catalina M.D.   On: 04/26/2019 15:20    Procedures Procedures (including critical care time)  Medications Ordered in UC Medications - No data to display  Initial Impression / Assessment and Plan / UC Course  I have reviewed the triage vital signs and the nursing notes.  Pertinent labs & imaging results that were available during my care of the patient were reviewed by me and considered in my medical decision making (see chart for details).     Patient is stable at discharge.  Left hand x-ray is negative for bony abnormality including fracture or dislocation.  I have reviewed the x-ray myself and the radiologist interpretation.  I am in agreement with the radiologist interpretation.  Was advised to follow-up with PCP for further evaluation with a CT or to go to ED.  There is a concern about decreased capillary refill with no swelling present.  States they will call their PCP.  Was advised to continue to take oxycodone as prescribed.  Return for worsening symptoms.  Final Clinical Impressions(s) / UC Diagnoses   Final diagnoses:  Pain in left finger(s)  Numbness of left hand     Discharge Instructions     Left hand x-ray was negative for bony abnormality including fracture or dislocation. Was advised to follow-up with PCP for further evaluation possibly for CT. Continue to take pain medication as prescribed Return or go to ED for worsening symptoms.    ED Prescriptions    None     I have reviewed the PDMP during this encounter.   Emerson Monte, FNP 04/26/19 1538

## 2019-04-26 NOTE — ED Triage Notes (Signed)
Pt was working on car last week when jack slipped  And weight of care injured right first finger, pain has become worse and cyanosis noted to tip of finger

## 2019-04-26 NOTE — Discharge Instructions (Addendum)
Left hand x-ray was negative for bony abnormality including fracture or dislocation. Was advised to follow-up with PCP for further evaluation possibly for CT. Continue to take pain medication as prescribed Return or go to ED for worsening symptoms.

## 2019-04-30 DIAGNOSIS — E1140 Type 2 diabetes mellitus with diabetic neuropathy, unspecified: Secondary | ICD-10-CM | POA: Diagnosis not present

## 2019-04-30 DIAGNOSIS — S6992XD Unspecified injury of left wrist, hand and finger(s), subsequent encounter: Secondary | ICD-10-CM | POA: Diagnosis not present

## 2019-05-03 DIAGNOSIS — Z79891 Long term (current) use of opiate analgesic: Secondary | ICD-10-CM | POA: Diagnosis not present

## 2019-05-03 DIAGNOSIS — E1142 Type 2 diabetes mellitus with diabetic polyneuropathy: Secondary | ICD-10-CM | POA: Diagnosis not present

## 2019-05-03 DIAGNOSIS — G894 Chronic pain syndrome: Secondary | ICD-10-CM | POA: Diagnosis not present

## 2019-05-04 DIAGNOSIS — S6722XA Crushing injury of left hand, initial encounter: Secondary | ICD-10-CM | POA: Diagnosis not present

## 2019-05-04 DIAGNOSIS — S6992XD Unspecified injury of left wrist, hand and finger(s), subsequent encounter: Secondary | ICD-10-CM | POA: Diagnosis not present

## 2019-05-09 DIAGNOSIS — S67197A Crushing injury of left little finger, initial encounter: Secondary | ICD-10-CM | POA: Diagnosis not present

## 2019-05-09 DIAGNOSIS — M792 Neuralgia and neuritis, unspecified: Secondary | ICD-10-CM | POA: Diagnosis not present

## 2019-05-15 ENCOUNTER — Ambulatory Visit: Payer: PPO

## 2019-05-15 DIAGNOSIS — S4492XA Injury of unspecified nerve at shoulder and upper arm level, left arm, initial encounter: Secondary | ICD-10-CM | POA: Diagnosis not present

## 2019-05-15 DIAGNOSIS — S6710XA Crushing injury of unspecified finger(s), initial encounter: Secondary | ICD-10-CM | POA: Diagnosis not present

## 2019-06-14 ENCOUNTER — Ambulatory Visit: Payer: PPO | Attending: Internal Medicine

## 2019-06-14 DIAGNOSIS — Z981 Arthrodesis status: Secondary | ICD-10-CM | POA: Diagnosis not present

## 2019-06-14 DIAGNOSIS — E1140 Type 2 diabetes mellitus with diabetic neuropathy, unspecified: Secondary | ICD-10-CM | POA: Diagnosis not present

## 2019-06-14 DIAGNOSIS — R109 Unspecified abdominal pain: Secondary | ICD-10-CM | POA: Diagnosis not present

## 2019-06-14 DIAGNOSIS — Z23 Encounter for immunization: Secondary | ICD-10-CM

## 2019-06-14 DIAGNOSIS — I10 Essential (primary) hypertension: Secondary | ICD-10-CM | POA: Diagnosis not present

## 2019-06-14 DIAGNOSIS — F17210 Nicotine dependence, cigarettes, uncomplicated: Secondary | ICD-10-CM | POA: Diagnosis not present

## 2019-06-14 DIAGNOSIS — T50Z95A Adverse effect of other vaccines and biological substances, initial encounter: Secondary | ICD-10-CM | POA: Diagnosis not present

## 2019-06-14 DIAGNOSIS — K5900 Constipation, unspecified: Secondary | ICD-10-CM | POA: Diagnosis not present

## 2019-06-14 DIAGNOSIS — R1010 Upper abdominal pain, unspecified: Secondary | ICD-10-CM | POA: Diagnosis not present

## 2019-06-14 NOTE — Progress Notes (Signed)
   Covid-19 Vaccination Clinic  Name:  Wayne Pratt    MRN: [id number] DOB: 01-28-1960  06/14/2019  [name] was observed post Covid-19 immunization for 15 minutes without incident. He was provided with Vaccine Information Sheet and instruction to access the V-Safe system.   [name] was instructed to call 911 with any severe reactions post vaccine: [name] Kitchen Difficulty breathing  . Swelling of face and throat  . A fast heartbeat  . A bad rash all over body  . Dizziness and weakness   Immunizations Administered    Name Date Dose VIS Date Route   Moderna COVID-19 Vaccine 06/14/2019  9:52 AM 0.5 mL 12/2018 Intramuscular   Manufacturer: Moderna   Lot: [id number]   [id number]

## 2019-06-28 DIAGNOSIS — G894 Chronic pain syndrome: Secondary | ICD-10-CM | POA: Diagnosis not present

## 2019-06-28 DIAGNOSIS — Z79891 Long term (current) use of opiate analgesic: Secondary | ICD-10-CM | POA: Diagnosis not present

## 2019-06-28 DIAGNOSIS — E1142 Type 2 diabetes mellitus with diabetic polyneuropathy: Secondary | ICD-10-CM | POA: Diagnosis not present

## 2019-07-27 ENCOUNTER — Emergency Department (HOSPITAL_COMMUNITY)
Admission: EM | Admit: 2019-07-27 | Discharge: 2019-07-27 | Disposition: A | Discharge status: Home / Self Care | Payer: PPO

## 2019-07-27 ENCOUNTER — Ambulatory Visit
Admission: EM | Admit: 2019-07-27 | Discharge: 2019-07-27 | Disposition: A | Discharge status: Other Acute Inpatient Hospital | Payer: PPO

## 2019-07-27 ENCOUNTER — Other Ambulatory Visit: Payer: Self-pay

## 2019-07-27 DIAGNOSIS — S29019A Strain of muscle and tendon of unspecified wall of thorax, initial encounter: Secondary | ICD-10-CM | POA: Diagnosis not present

## 2019-07-27 DIAGNOSIS — Z7984 Long term (current) use of oral hypoglycemic drugs: Secondary | ICD-10-CM | POA: Diagnosis not present

## 2019-07-27 DIAGNOSIS — M4802 Spinal stenosis, cervical region: Secondary | ICD-10-CM | POA: Diagnosis not present

## 2019-07-27 DIAGNOSIS — Z9689 Presence of other specified functional implants: Secondary | ICD-10-CM | POA: Diagnosis not present

## 2019-07-27 DIAGNOSIS — S299XXA Unspecified injury of thorax, initial encounter: Secondary | ICD-10-CM | POA: Diagnosis not present

## 2019-07-27 DIAGNOSIS — M549 Dorsalgia, unspecified: Secondary | ICD-10-CM | POA: Diagnosis not present

## 2019-07-27 DIAGNOSIS — Z79899 Other long term (current) drug therapy: Secondary | ICD-10-CM | POA: Diagnosis not present

## 2019-07-27 DIAGNOSIS — S161XXA Strain of muscle, fascia and tendon at neck level, initial encounter: Secondary | ICD-10-CM | POA: Diagnosis not present

## 2019-07-27 DIAGNOSIS — J45909 Unspecified asthma, uncomplicated: Secondary | ICD-10-CM | POA: Diagnosis not present

## 2019-07-27 DIAGNOSIS — E119 Type 2 diabetes mellitus without complications: Secondary | ICD-10-CM | POA: Diagnosis not present

## 2019-07-27 DIAGNOSIS — Z981 Arthrodesis status: Secondary | ICD-10-CM | POA: Diagnosis not present

## 2019-07-27 DIAGNOSIS — I10 Essential (primary) hypertension: Secondary | ICD-10-CM | POA: Diagnosis not present

## 2019-07-27 DIAGNOSIS — Z880 Allergy status to penicillin: Secondary | ICD-10-CM | POA: Diagnosis not present

## 2019-07-27 DIAGNOSIS — M47812 Spondylosis without myelopathy or radiculopathy, cervical region: Secondary | ICD-10-CM | POA: Diagnosis not present

## 2019-07-27 DIAGNOSIS — M2578 Osteophyte, vertebrae: Secondary | ICD-10-CM | POA: Diagnosis not present

## 2019-07-27 DIAGNOSIS — S199XXA Unspecified injury of neck, initial encounter: Secondary | ICD-10-CM | POA: Diagnosis not present

## 2019-07-27 DIAGNOSIS — F17210 Nicotine dependence, cigarettes, uncomplicated: Secondary | ICD-10-CM | POA: Diagnosis not present

## 2019-07-27 DIAGNOSIS — S0990XA Unspecified injury of head, initial encounter: Secondary | ICD-10-CM | POA: Diagnosis not present

## 2019-07-27 NOTE — ED Triage Notes (Signed)
Patient states he has had previous surgery to his neck and has plates and screws. States 1 week ago a large tree limb fell on his neck and he has had progressively worsening pain. Today the pain is 10/10 and patient is unable to move his neck. Per provider Avegno patient advised to go to the ED for further evaluation. Declined ambulance to ED, wife will drive him.

## 2019-08-09 DIAGNOSIS — M542 Cervicalgia: Secondary | ICD-10-CM | POA: Diagnosis not present

## 2019-08-09 DIAGNOSIS — Z6831 Body mass index (BMI) 31.0-31.9, adult: Secondary | ICD-10-CM | POA: Diagnosis not present

## 2019-08-09 DIAGNOSIS — M4802 Spinal stenosis, cervical region: Secondary | ICD-10-CM | POA: Diagnosis not present

## 2019-08-09 DIAGNOSIS — I10 Essential (primary) hypertension: Secondary | ICD-10-CM | POA: Diagnosis not present

## 2019-08-16 DIAGNOSIS — Z79891 Long term (current) use of opiate analgesic: Secondary | ICD-10-CM | POA: Diagnosis not present

## 2019-08-16 DIAGNOSIS — E1142 Type 2 diabetes mellitus with diabetic polyneuropathy: Secondary | ICD-10-CM | POA: Diagnosis not present

## 2019-08-16 DIAGNOSIS — S161XXS Strain of muscle, fascia and tendon at neck level, sequela: Secondary | ICD-10-CM | POA: Diagnosis not present

## 2019-08-16 DIAGNOSIS — G894 Chronic pain syndrome: Secondary | ICD-10-CM | POA: Diagnosis not present

## 2019-08-23 DIAGNOSIS — Z4542 Encounter for adjustment and management of neuropacemaker (brain) (peripheral nerve) (spinal cord): Secondary | ICD-10-CM | POA: Diagnosis not present

## 2019-08-23 DIAGNOSIS — M4802 Spinal stenosis, cervical region: Secondary | ICD-10-CM | POA: Diagnosis not present

## 2019-08-29 DIAGNOSIS — Z4542 Encounter for adjustment and management of neuropacemaker (brain) (peripheral nerve) (spinal cord): Secondary | ICD-10-CM | POA: Diagnosis not present

## 2019-08-29 HISTORY — PX: SPINAL CORD STIMULATOR IMPLANT: SHX2422

## 2019-09-19 DIAGNOSIS — M4802 Spinal stenosis, cervical region: Secondary | ICD-10-CM | POA: Diagnosis not present

## 2019-09-19 DIAGNOSIS — M542 Cervicalgia: Secondary | ICD-10-CM | POA: Diagnosis not present

## 2019-09-27 DIAGNOSIS — M542 Cervicalgia: Secondary | ICD-10-CM | POA: Diagnosis not present

## 2019-10-11 DIAGNOSIS — M4802 Spinal stenosis, cervical region: Secondary | ICD-10-CM | POA: Diagnosis not present

## 2019-10-11 DIAGNOSIS — Z79891 Long term (current) use of opiate analgesic: Secondary | ICD-10-CM | POA: Diagnosis not present

## 2019-10-11 DIAGNOSIS — M4722 Other spondylosis with radiculopathy, cervical region: Secondary | ICD-10-CM | POA: Diagnosis not present

## 2019-10-11 DIAGNOSIS — E1142 Type 2 diabetes mellitus with diabetic polyneuropathy: Secondary | ICD-10-CM | POA: Diagnosis not present

## 2019-10-11 DIAGNOSIS — G894 Chronic pain syndrome: Secondary | ICD-10-CM | POA: Diagnosis not present

## 2019-12-04 DIAGNOSIS — E1142 Type 2 diabetes mellitus with diabetic polyneuropathy: Secondary | ICD-10-CM | POA: Diagnosis not present

## 2019-12-04 DIAGNOSIS — M4722 Other spondylosis with radiculopathy, cervical region: Secondary | ICD-10-CM | POA: Diagnosis not present

## 2019-12-04 DIAGNOSIS — Z79891 Long term (current) use of opiate analgesic: Secondary | ICD-10-CM | POA: Diagnosis not present

## 2019-12-04 DIAGNOSIS — G894 Chronic pain syndrome: Secondary | ICD-10-CM | POA: Diagnosis not present

## 2019-12-30 ENCOUNTER — Encounter: Payer: Self-pay | Admitting: Emergency Medicine

## 2019-12-30 ENCOUNTER — Ambulatory Visit
Admission: EM | Admit: 2019-12-30 | Discharge: 2019-12-30 | Disposition: A | Discharge status: Home / Self Care | Payer: PPO | Attending: Emergency Medicine | Admitting: Emergency Medicine

## 2019-12-30 ENCOUNTER — Ambulatory Visit (INDEPENDENT_AMBULATORY_CARE_PROVIDER_SITE_OTHER): Payer: PPO

## 2019-12-30 ENCOUNTER — Other Ambulatory Visit: Payer: Self-pay

## 2019-12-30 DIAGNOSIS — M1610 Unilateral primary osteoarthritis, unspecified hip: Secondary | ICD-10-CM | POA: Diagnosis not present

## 2019-12-30 DIAGNOSIS — M79651 Pain in right thigh: Secondary | ICD-10-CM

## 2019-12-30 DIAGNOSIS — W19XXXA Unspecified fall, initial encounter: Secondary | ICD-10-CM | POA: Diagnosis not present

## 2019-12-30 DIAGNOSIS — M1611 Unilateral primary osteoarthritis, right hip: Secondary | ICD-10-CM | POA: Diagnosis not present

## 2019-12-30 NOTE — ED Provider Notes (Signed)
[place]   [id number] 12/30/19 Arrival Time: 1403   Chief Complaint  Patient presents with   Leg Pain     SUBJECTIVE: History from: patient.  Wayne Pratt is a 59 y.o. male who presented to the urgent care with a complaint of right upper leg pain for the past 1 week.  Developed the symptom after falling and sleeping.  Localizes the pain to the right upper leg.  Symptoms are made worse with ROM.  Denies previous symptoms in the past.  Denies alleviating or aggravating factors.  Denies fever, chills, nausea, vomiting, diarrhea, paresthesia, changes in bowel or bladder habits.     ROS: As per HPI.  All other pertinent ROS negative.      Past Medical History:  Diagnosis Date   Anemia    takes Iron pill daily   Anxiety    takes Xanax daily   Arthritis    Diabetes mellitus ([facility])    takes Metformin daily   History of bronchitis    mid 90's   History of hypertension    lost weight and been off meds over a yr   History of kidney stones    History of pleural empyema    Hypercholesterolemia    lost weight and been off meds over a yr   Hypertension    Insomnia    takes Trazodone nightly   Kidney stones    22 times   Neuropathy    Pneumonia    hx of-15+ yrs ago   Sleep apnea    Stop Bang score of 7   Weakness    numbness and tingling   Past Surgical History:  Procedure Laterality Date   BIOPSY  10/05/2011   Dr. Oneida Alar :non-erosive gastritis   BIOPSY  08/06/2014   Procedure: BIOPSY;  Surgeon: Danie Binder, MD;  Location: [facility];  Service: Endoscopy;;   CERVICAL [place] Right    ESOPHAGOGASTRODUODENOSCOPY  Sept 2013   Dr. Oneida Alar: chronic inactive gastritis    ESOPHAGOGASTRODUODENOSCOPY N/A 08/18/2017   Procedure: ESOPHAGOGASTRODUODENOSCOPY (EGD);  Surgeon: Milus Banister, MD;  Location: [facility];  Service: Endoscopy;  Laterality: N/A;   ESOPHAGOGASTRODUODENOSCOPY N/A 03/09/2018   Procedure:  ESOPHAGOGASTRODUODENOSCOPY (EGD);  Surgeon: Milus Banister, MD;  Location: [facility];  Service: Endoscopy;  Laterality: N/A;   ESOPHAGOGASTRODUODENOSCOPY (EGD) WITH PROPOFOL N/A 08/06/2014   mild non-erosive gastritis, duodenitis   EUS N/A 08/18/2017   Procedure: UPPER ENDOSCOPIC ULTRASOUND (EUS) RADIAL;  Surgeon: Milus Banister, MD;  Location: [facility];  Service: Endoscopy;  Laterality: N/A;   EUS N/A 03/09/2018   Procedure: UPPER ENDOSCOPIC ULTRASOUND (EUS) RADIAL;  Surgeon: Milus Banister, MD;  Location: [facility];  Service: Endoscopy;  Laterality: N/A;   FINE NEEDLE ASPIRATION  03/09/2018   Procedure: FINE NEEDLE ASPIRATION;  Surgeon: Milus Banister, MD;  Location: [facility];  Service: Endoscopy;;   FLEXIBLE SIGMOIDOSCOPY  10/05/2011   Dr. Fields:poor prep   [place]  2010   for empyema   SPINAL CORD STIMULATOR INSERTION N/A 08/01/2015   Procedure: Spinal Cord Stimulator Implant;  Surgeon: Eustace Moore, MD;  Location: [place] [facility] [facility];  Service: Neurosurgery;  Laterality: N/A;   TONSILLECTOMY     VASECTOMY Bilateral 1995   Allergies  Allergen Reactions   Penicillins Anaphylaxis and Other (See Comments)    Has patient had a PCN reaction causing immediate rash, facial/tongue/throat swelling, SOB or lightheadedness with hypotension: Yes Has patient  had a PCN reaction causing severe rash involving mucus membranes or skin necrosis: Yes Has patient had a PCN reaction that required hospitalization: No Has patient had a PCN reaction occurring within the last 10 years: No If all of the above answers are "NO", then may proceed with Cephalosporin use.    Ativan [Lorazepam] Other (See Comments)    Violent and Mean    No current facility-administered medications on file prior to encounter.   Current Outpatient Medications on File Prior to Encounter  Medication Sig Dispense Refill   albuterol (VENTOLIN HFA) 108 (90 Base) MCG/ACT inhaler Inhale 1-2 puffs into the  lungs every 6 (six) hours as needed for wheezing or shortness of breath. 18 g 0   azithromycin (ZITHROMAX) 250 MG tablet Take 1 tablet (250 mg total) by mouth daily. Take first 2 tablets together, then 1 every day until finished. 6 tablet 0   benzonatate (TESSALON) 100 MG capsule Take 1 capsule (100 mg total) by mouth every 8 (eight) hours. 60 capsule 0   clonazePAM (KLONOPIN) 0.5 MG tablet Take 0.5 mg by mouth 2 (two) times daily.      cyclobenzaprine (FLEXERIL) 10 MG tablet Take 10 mg by mouth 3 (three) times daily as needed for muscle spasms.      GLIPIZIDE XL 10 MG 24 hr tablet Take 10 mg by mouth 2 (two) times daily.     HYDROmorphone (DILAUDID) 4 MG tablet Take 1 tablet (4 mg total) by mouth every 6 (six) hours as needed for severe pain. 20 tablet 0   IBU 800 MG tablet Take 800 mg by mouth daily as needed for headache.      lisinopril (ZESTRIL) 10 MG tablet Take 1 tablet (10 mg total) by mouth daily. 90 tablet 3   lovastatin (MEVACOR) 40 MG tablet Take 40 mg by mouth at bedtime.      metFORMIN (GLUCOPHAGE) 1000 MG tablet Take 1,000 mg by mouth 2 (two) times daily.      metoprolol tartrate (LOPRESSOR) 100 MG tablet TAKE 1 TABLET 2 HR PRIOR TO CARDIAC PROCEDURE 1 tablet 0   morphine (MSIR) 15 MG tablet Take 1 tablet by mouth every 4 (four) hours as needed for pain.     naproxen sodium (ALEVE) 220 MG tablet Take 440 mg by mouth daily as needed (for pain or headache).     omeprazole (PRILOSEC) 20 MG capsule TAKE 1 CAPSULE ONCE DAILY 30 MINUTES BEFORE YOUR FIRST MEAL. (Patient taking differently: Take 20 mg by mouth daily. ) 30 capsule 5   ondansetron (ZOFRAN ODT) 4 MG disintegrating tablet 4mg  ODT q4 hours prn nausea/vomit 12 tablet 0   ondansetron (ZOFRAN) 8 MG tablet Take 1 tablet (8 mg total) by mouth every 4 (four) hours as needed. 10 tablet 0   Oxycodone HCl 10 MG TABS Take 10 mg by mouth every 4 (four) hours.      oxyCODONE-acetaminophen (PERCOCET) 5-325 MG tablet Take 1  tablet by mouth every 4 (four) hours as needed. 15 tablet 0   pantoprazole (PROTONIX) 40 MG tablet TAKE 1 TABLET BY MOUTH 30 MINUTES PRIOR TO BREAKFAST. (Patient taking differently: Take 40 mg by mouth daily. ) 90 tablet 3   pioglitazone (ACTOS) 15 MG tablet Take 15 mg by mouth daily.     predniSONE (DELTASONE) 10 MG tablet Take 2 tablets (20 mg total) by mouth daily. 15 tablet 0   Social History   Socioeconomic History   Marital status: Married    Spouse name:  Not on file   Number of children: 2   Years of education: Not on file   Highest education level: Not on file  Occupational History   Occupation: disabled    Comment: 2005  Tobacco Use   Smoking status: Current Every Day Smoker    Packs/day: 1.00    Years: 39.00    Pack years: 39.00    Types: Cigarettes   Smokeless tobacco: Never Used  Scientific laboratory technician Use: Never used  Substance and Sexual Activity   Alcohol use: No    Alcohol/week: 0.0 standard drinks   Drug use: No   Sexual activity: Yes    Birth control/protection: None  Other Topics Concern   Not on file  Social History Narrative   Not on file   Social Determinants of Health   Financial Resource Strain: Not on file  Food Insecurity: Not on file  Transportation Needs: Not on file  Physical Activity: Not on file  Stress: Not on file  Social Connections: Not on file  Intimate Partner Violence: Not on file   Family History  Problem Relation Age of Onset   Prostate cancer Other        all male members on mother's side   Heart attack Father    COPD Maternal Grandmother    Cancer Paternal Grandmother    Colon cancer Neg Hx     OBJECTIVE:  Vitals:   12/30/19 1546 12/30/19 1548  BP:  127/82  Pulse:  89  Resp:  19  Temp:  98.1 F (36.7 C)  TempSrc:  Oral  SpO2:  95%  Weight: 235 lb (106.6 kg)   Height: 6\' 1"  (1.854 m)      Physical Exam Vitals and nursing note reviewed.  Constitutional:      General: He is not in acute  distress.    Appearance: Normal appearance. He is normal weight. He is not ill-appearing, toxic-appearing or diaphoretic.  Cardiovascular:     Rate and Rhythm: Normal rate and regular rhythm.     Pulses: Normal pulses.     Heart sounds: Normal heart sounds. No murmur heard. No friction rub. No gallop.   Pulmonary:     Effort: Pulmonary effort is normal. No respiratory distress.     Breath sounds: Normal breath sounds. No stridor. No wheezing, rhonchi or rales.  Chest:     Chest wall: No tenderness.  Musculoskeletal:        General: Tenderness present.     Comments: The right hip/upper leg is without any obvious asymmetry when compared to the left hip/upper leg.  There is no ecchymosis, open wound, lesion or warmth present.  Limited range of motion due to pain.  Neurovascular status intact.  Neurological:     Mental Status: He is alert and oriented to person, place, and time.     LABS:  No results found for this or any previous visit (from the past 24 hour(s)).   RADIOLOGY  DG Femur Min 2 Views Right  Result Date: 12/30/2019 CLINICAL DATA:  Pain after fall. EXAM: RIGHT FEMUR 2 VIEWS COMPARISON:  None. FINDINGS: Degenerative change in the right hip without significant loss of joint space. No fractures. Vascular calcifications are noted. IMPRESSION: Degenerative change in the right hip. No fractures. Electronically Signed   By: Dorise Bullion III M.D   On: 12/30/2019 16:16    Right femur x-ray is negative for bony abnormality including fracture or dislocation.  I have reviewed the x-ray myself  and the radiologist interpretation.  I am in agreement with the radiologist interpretation.   ASSESSMENT & PLAN:  1. Pain of right thigh   2. Hip arthritis     No orders of the defined types were placed in this encounter.    Discharge instruction  Continue to take OTC Tylenol as needed for mild pain Continue to take oxycodone as needed for severe pain Follow RICE instruction that  is attached Follow-up with PCP Return or go to ED if you develop any new or worsening of your symptoms  Reviewed expectations re: course of current medical issues. Questions answered. Outlined signs and symptoms indicating need for more acute intervention. Patient verbalized understanding. After Visit Summary given.    PDMP reviewed during this encounter.      Emerson Monte, FNP 12/30/19 1629

## 2019-12-30 NOTE — Discharge Instructions (Addendum)
Continue to take OTC Tylenol as needed for mild pain Continue to take oxycodone as needed for severe pain Follow RICE instruction that is attached Follow-up with PCP Return or go to ED if you develop any new or worsening of your symptoms

## 2019-12-30 NOTE — ED Triage Notes (Addendum)
Pain to upper RT leg on the side x 1 week after falling slipping and falling in the woods

## 2020-01-03 DIAGNOSIS — R52 Pain, unspecified: Secondary | ICD-10-CM | POA: Diagnosis not present

## 2020-01-03 DIAGNOSIS — F17210 Nicotine dependence, cigarettes, uncomplicated: Secondary | ICD-10-CM | POA: Diagnosis not present

## 2020-01-03 DIAGNOSIS — J45909 Unspecified asthma, uncomplicated: Secondary | ICD-10-CM | POA: Diagnosis not present

## 2020-01-03 DIAGNOSIS — M47817 Spondylosis without myelopathy or radiculopathy, lumbosacral region: Secondary | ICD-10-CM | POA: Diagnosis not present

## 2020-01-03 DIAGNOSIS — E119 Type 2 diabetes mellitus without complications: Secondary | ICD-10-CM | POA: Diagnosis not present

## 2020-01-03 DIAGNOSIS — I10 Essential (primary) hypertension: Secondary | ICD-10-CM | POA: Diagnosis not present

## 2020-01-03 DIAGNOSIS — Z79891 Long term (current) use of opiate analgesic: Secondary | ICD-10-CM | POA: Diagnosis not present

## 2020-01-03 DIAGNOSIS — M5431 Sciatica, right side: Secondary | ICD-10-CM | POA: Diagnosis not present

## 2020-01-03 DIAGNOSIS — M545 Low back pain, unspecified: Secondary | ICD-10-CM | POA: Diagnosis not present

## 2020-01-09 ENCOUNTER — Other Ambulatory Visit: Payer: Self-pay | Admitting: Adult Health

## 2020-01-15 DIAGNOSIS — M5416 Radiculopathy, lumbar region: Secondary | ICD-10-CM | POA: Diagnosis not present

## 2020-01-16 ENCOUNTER — Other Ambulatory Visit: Payer: Self-pay | Admitting: Neurological Surgery

## 2020-01-16 DIAGNOSIS — M5416 Radiculopathy, lumbar region: Secondary | ICD-10-CM

## 2020-01-21 DIAGNOSIS — Z720 Tobacco use: Secondary | ICD-10-CM | POA: Diagnosis not present

## 2020-01-21 DIAGNOSIS — M79604 Pain in right leg: Secondary | ICD-10-CM | POA: Diagnosis not present

## 2020-01-21 DIAGNOSIS — R0989 Other specified symptoms and signs involving the circulatory and respiratory systems: Secondary | ICD-10-CM | POA: Diagnosis not present

## 2020-01-21 DIAGNOSIS — R209 Unspecified disturbances of skin sensation: Secondary | ICD-10-CM | POA: Diagnosis not present

## 2020-01-23 DIAGNOSIS — I708 Atherosclerosis of other arteries: Secondary | ICD-10-CM | POA: Diagnosis not present

## 2020-01-23 DIAGNOSIS — I2510 Atherosclerotic heart disease of native coronary artery without angina pectoris: Secondary | ICD-10-CM | POA: Diagnosis not present

## 2020-01-23 DIAGNOSIS — I700 Atherosclerosis of aorta: Secondary | ICD-10-CM | POA: Diagnosis not present

## 2020-01-23 DIAGNOSIS — I771 Stricture of artery: Secondary | ICD-10-CM | POA: Diagnosis not present

## 2020-01-23 DIAGNOSIS — I743 Embolism and thrombosis of arteries of the lower extremities: Secondary | ICD-10-CM | POA: Diagnosis not present

## 2020-01-23 DIAGNOSIS — Z720 Tobacco use: Secondary | ICD-10-CM | POA: Diagnosis not present

## 2020-01-23 DIAGNOSIS — I70201 Unspecified atherosclerosis of native arteries of extremities, right leg: Secondary | ICD-10-CM | POA: Diagnosis not present

## 2020-01-25 ENCOUNTER — Other Ambulatory Visit: Payer: Self-pay

## 2020-01-25 DIAGNOSIS — I10 Essential (primary) hypertension: Secondary | ICD-10-CM | POA: Insufficient documentation

## 2020-01-25 DIAGNOSIS — I739 Peripheral vascular disease, unspecified: Secondary | ICD-10-CM

## 2020-01-29 ENCOUNTER — Other Ambulatory Visit (HOSPITAL_COMMUNITY): Payer: Self-pay | Admitting: Vascular Surgery

## 2020-01-29 ENCOUNTER — Ambulatory Visit (HOSPITAL_COMMUNITY)
Admission: RE | Admit: 2020-01-29 | Discharge: 2020-01-29 | Disposition: A | Discharge status: Home / Self Care | Payer: PPO | Source: Ambulatory Visit | Attending: Vascular Surgery | Admitting: Vascular Surgery

## 2020-01-29 ENCOUNTER — Ambulatory Visit (INDEPENDENT_AMBULATORY_CARE_PROVIDER_SITE_OTHER)
Admission: RE | Admit: 2020-01-29 | Discharge: 2020-01-29 | Disposition: A | Discharge status: Home / Self Care | Payer: PPO | Source: Ambulatory Visit | Attending: Vascular Surgery | Admitting: Vascular Surgery

## 2020-01-29 ENCOUNTER — Ambulatory Visit: Payer: PPO | Admitting: Vascular Surgery

## 2020-01-29 ENCOUNTER — Encounter: Payer: Self-pay | Admitting: Vascular Surgery

## 2020-01-29 ENCOUNTER — Other Ambulatory Visit: Payer: Self-pay

## 2020-01-29 DIAGNOSIS — I739 Peripheral vascular disease, unspecified: Secondary | ICD-10-CM

## 2020-01-29 DIAGNOSIS — I70229 Atherosclerosis of native arteries of extremities with rest pain, unspecified extremity: Secondary | ICD-10-CM

## 2020-01-29 MED ORDER — OXYCODONE HCL 10 MG PO TABS: 10.0000 mg | ORAL_TABLET | ORAL | 0 refills | Status: DC

## 2020-01-29 NOTE — Progress Notes (Addendum)
Patient name: Wayne Pratt MRN: [id number] DOB: May 02, 1960 Sex: male  REASON FOR CONSULT: Right lower extremity pain and evaluate for PAD  HPI: Wayne Pratt is a 60 y.o. male, with history of hypertension, hyperlipidemia, DM, and tobacco abuse that presents for evaluation of right leg pain.  Patient states he noticed that his right leg was having a lot of cramping and discomfort that started back in deer season that really limited his activity.  Ultimately he was unable to deer hunt at that time.  He had progressive symptoms about 3 weeks ago where he had worsening pain in the leg to the point that it was bothering him all the time and he is now requiring narcotics.  He is having to dangle his foot off the side of the bed for relief.  Has had trouble wiggling toes for several weeks.  He is a smoker and smokes at least a pack a day.  He had a CTA on 01/23/2020 that showed occlusion of the right common femoral artery with occlusion of the profunda and also the proximal right SFA.  He does appear to have reconstitution of flow in the SFA with a small flow channel, the popliteal is open, and all three tibials are open with disease.    Past Medical History:  Diagnosis Date  . Anemia    takes Iron pill daily  . Anxiety    takes Xanax daily  . Arthritis   . Diabetes mellitus ([place])    takes Metformin daily  . History of bronchitis    mid 90's  . History of hypertension    lost weight and been off meds over a yr  . History of kidney stones   . History of pleural empyema   . Hypercholesterolemia    lost weight and been off meds over a yr  . Hypertension   . Insomnia    takes Trazodone nightly  . Kidney stones    22 times  . Neuropathy   . Pneumonia    hx of-15+ yrs ago  . Sleep apnea    Stop Bang score of 7  . Weakness    numbness and tingling    Past Surgical History:  Procedure Laterality Date  . BIOPSY  10/05/2011   Dr. Oneida Alar :non-erosive gastritis  . BIOPSY  08/06/2014    Procedure: BIOPSY;  Surgeon: Danie Binder, MD;  Location: [facility];  Service: Endoscopy;;  . CERVICAL DISC SURGERY    . ELBOW SURGERY Right   . ESOPHAGOGASTRODUODENOSCOPY  Sept 2013   Dr. Oneida Alar: chronic inactive gastritis   . ESOPHAGOGASTRODUODENOSCOPY N/A 08/18/2017   Procedure: ESOPHAGOGASTRODUODENOSCOPY (EGD);  Surgeon: Milus Banister, MD;  Location: [facility];  Service: Endoscopy;  Laterality: N/A;  . ESOPHAGOGASTRODUODENOSCOPY N/A 03/09/2018   Procedure: ESOPHAGOGASTRODUODENOSCOPY (EGD);  Surgeon: Milus Banister, MD;  Location: [facility];  Service: Endoscopy;  Laterality: N/A;  . ESOPHAGOGASTRODUODENOSCOPY (EGD) WITH PROPOFOL N/A 08/06/2014   mild non-erosive gastritis, duodenitis  . EUS N/A 08/18/2017   Procedure: UPPER ENDOSCOPIC ULTRASOUND (EUS) RADIAL;  Surgeon: Milus Banister, MD;  Location: [facility];  Service: Endoscopy;  Laterality: N/A;  . EUS N/A 03/09/2018   Procedure: UPPER ENDOSCOPIC ULTRASOUND (EUS) RADIAL;  Surgeon: Milus Banister, MD;  Location: [facility];  Service: Endoscopy;  Laterality: N/A;  . FINE NEEDLE ASPIRATION  03/09/2018   Procedure: FINE NEEDLE ASPIRATION;  Surgeon: Milus Banister, MD;  Location: [facility];  Service: Endoscopy;;  . FLEXIBLE SIGMOIDOSCOPY  10/05/2011   Dr. Fields:poor prep  . LUNG SURGERY  2010   for empyema  . SPINAL CORD STIMULATOR INSERTION N/A 08/01/2015   Procedure: Spinal Cord Stimulator Implant;  Surgeon: Eustace Moore, MD;  Location: [facility];  Service: Neurosurgery;  Laterality: N/A;  . TONSILLECTOMY    . VASECTOMY Bilateral 1995    Family History  Problem Relation Age of Onset  . Prostate cancer Other        all male members on mother's side  . Heart attack Father   . COPD Maternal Grandmother   . Cancer Paternal Grandmother   . Colon cancer Neg Hx     SOCIAL HISTORY: Social History   Socioeconomic History  . Marital status: Married    Spouse name: Not on file  . Number of children: 2  . Years  of education: Not on file  . Highest education level: Not on file  Occupational History  . Occupation: disabled    Comment: 2005  Tobacco Use  . Smoking status: Current Every Day Smoker    Packs/day: 1.00    Years: 39.00    Pack years: 39.00    Types: Cigarettes  . Smokeless tobacco: Never Used  Vaping Use  . Vaping Use: Never used  Substance and Sexual Activity  . Alcohol use: No    Alcohol/week: 0.0 standard drinks  . Drug use: No  . Sexual activity: Yes    Birth control/protection: None  Other Topics Concern  . Not on file  Social History Narrative  . Not on file   Social Determinants of Health   Financial Resource Strain: Not on file  Food Insecurity: Not on file  Transportation Needs: Not on file  Physical Activity: Not on file  Stress: Not on file  Social Connections: Not on file  Intimate Partner Violence: Not on file    Allergies  Allergen Reactions  . Penicillins Anaphylaxis and Other (See Comments)    Has patient had a PCN reaction causing immediate rash, facial/tongue/throat swelling, SOB or lightheadedness with hypotension: Yes Has patient had a PCN reaction causing severe rash involving mucus membranes or skin necrosis: Yes Has patient had a PCN reaction that required hospitalization: No Has patient had a PCN reaction occurring within the last 10 years: No If all of the above answers are "NO", then may proceed with Cephalosporin use.   . Ativan [Lorazepam] Other (See Comments)    Violent and Mean     Current Outpatient Medications  Medication Sig Dispense Refill  . albuterol (VENTOLIN HFA) 108 (90 Base) MCG/ACT inhaler Inhale 1-2 puffs into the lungs every 6 (six) hours as needed for wheezing or shortness of breath. 18 g 0  . clonazePAM (KLONOPIN) 0.5 MG tablet Take 0.5 mg by mouth 2 (two) times daily.     . cyclobenzaprine (FLEXERIL) 10 MG tablet Take 10 mg by mouth 3 (three) times daily as needed for muscle spasms.     [name] Kitchen GLIPIZIDE XL 10 MG 24 hr  tablet Take 10 mg by mouth 2 (two) times daily.    . IBU 800 MG tablet Take 800 mg by mouth daily as needed for headache.     . lisinopril (ZESTRIL) 10 MG tablet TAKE 1 TABLET BY MOUTH ONCE A DAY. 90 tablet 0  . lovastatin (MEVACOR) 40 MG tablet Take 40 mg by mouth at bedtime.     . metFORMIN (GLUCOPHAGE) 1000 MG tablet Take 1,000 mg by mouth 2 (two) times daily.     [name] Kitchen  metoprolol tartrate (LOPRESSOR) 100 MG tablet TAKE 1 TABLET 2 HR PRIOR TO CARDIAC PROCEDURE 1 tablet 0  . naproxen sodium (ALEVE) 220 MG tablet Take 440 mg by mouth daily as needed (for pain or headache).    . Oxycodone HCl 10 MG TABS Take 10 mg by mouth every 4 (four) hours.     . pioglitazone (ACTOS) 15 MG tablet Take 15 mg by mouth daily.    [name] Kitchen azithromycin (ZITHROMAX) 250 MG tablet Take 1 tablet (250 mg total) by mouth daily. Take first 2 tablets together, then 1 every day until finished. 6 tablet 0  . benzonatate (TESSALON) 100 MG capsule Take 1 capsule (100 mg total) by mouth every 8 (eight) hours. 60 capsule 0  . HYDROmorphone (DILAUDID) 4 MG tablet Take 1 tablet (4 mg total) by mouth every 6 (six) hours as needed for severe pain. 20 tablet 0  . morphine (MSIR) 15 MG tablet Take 1 tablet by mouth every 4 (four) hours as needed for pain.    [name] Kitchen omeprazole (PRILOSEC) 20 MG capsule TAKE 1 CAPSULE ONCE DAILY 30 MINUTES BEFORE YOUR FIRST MEAL. (Patient not taking: Reported on 01/29/2020) 30 capsule 5  . ondansetron (ZOFRAN ODT) 4 MG disintegrating tablet 4mg  ODT q4 hours prn nausea/vomit 12 tablet 0  . ondansetron (ZOFRAN) 8 MG tablet Take 1 tablet (8 mg total) by mouth every 4 (four) hours as needed. 10 tablet 0  . oxyCODONE-acetaminophen (PERCOCET) 5-325 MG tablet Take 1 tablet by mouth every 4 (four) hours as needed. 15 tablet 0  . pantoprazole (PROTONIX) 40 MG tablet TAKE 1 TABLET BY MOUTH 30 MINUTES PRIOR TO BREAKFAST. (Patient taking differently: Take 40 mg by mouth daily. ) 90 tablet 3  . predniSONE (DELTASONE) 10 MG tablet Take  2 tablets (20 mg total) by mouth daily. 15 tablet 0   No current facility-administered medications for this visit.    REVIEW OF SYSTEMS:  [X]  denotes positive finding, [ ]  denotes negative finding Cardiac  Comments:  Chest pain or chest pressure:    Shortness of breath upon exertion:    Short of breath when lying flat:    Irregular heart rhythm:        Vascular    Pain in calf, thigh, or hip brought on by ambulation: x   Pain in feet at night that wakes you up from your sleep:  x   Blood clot in your veins:    Leg swelling:         Pulmonary    Oxygen at home:    Productive cough:     Wheezing:         Neurologic    Sudden weakness in arms or legs:     Sudden numbness in arms or legs:     Sudden onset of difficulty speaking or slurred speech:    Temporary loss of vision in one eye:     Problems with dizziness:         Gastrointestinal    Blood in stool:     Vomited blood:         Genitourinary    Burning when urinating:     Blood in urine:        Psychiatric    Major depression:         Hematologic    Bleeding problems:    Problems with blood clotting too easily:        Skin    Rashes or ulcers:  Constitutional    Fever or chills:      PHYSICAL EXAM: Vitals:   01/29/20 1340  BP: 122/77  Pulse: 87  Resp: 18  Temp: 98.3 F (36.8 C)  TempSrc: Temporal  SpO2: 97%  Weight: 234 lb (106.1 kg)  Height: 6\' 1"  (1.854 m)    GENERAL: The patient is a well-nourished male, in no acute distress. The vital signs are documented above. CARDIAC: There is a regular rate and rhythm.  VASCULAR:  Left femoral pulse 2+ palpable Right femoral pulse 1+ palpable Right foot with monophasic PT signal Difficulty wiggling toes Left DP palpable PULMONARY: No respiratory distress. ABDOMEN: Soft and non-tender. MUSCULOSKELETAL: There are no major deformities or cyanosis. NEUROLOGIC: No focal weakness or paresthesias are detected. SKIN: There are no ulcers or rashes  noted. PSYCHIATRIC: The patient has a normal affect.  DATA:   ABIs at the right ankle were zero in the office  I independently reviewed CTA abdomen pelvis with runoff from 01/23/2020 and distal right common femoral artery is occluded and the proximal profunda is occluded with distal reconstitution and the proximal SFA is occluded but does have a flow channel with a patent popliteal and diseased three-vessel runoff.  Assessment/Plan:  60 year old male that presents with critical limb ischemia of the right lower extremity with severe rest pain.  I reviewed his CTA abdomen pelvis with runoff and appears he has an occluded common femoral and also an occluded profunda with an occluded proximal SFA.  He does reconstitute what appears to be a flow channel in the SFA and has diseased three-vessel runoff.  I think he ultimately is going to require right common femoral endarterectomy with profundoplasty and a possible femoropopliteal bypass.  I have recommended arteriogram on Thursday to evaluate for bypass target given that he was having disabling claudication in his right leg during hunting season likely before his common femoral occluded.  We will get him vein mapped in the office today.  I discussed after his arteriogram on Thursday I will have him set up for femoral endarterectomy, profundoplasty, and possible femoropopliteal bypass on Monday.  He understands he is at high risk for limb loss.     Marty Heck, MD [facility] Office: [phone]

## 2020-01-30 ENCOUNTER — Other Ambulatory Visit (HOSPITAL_COMMUNITY)
Admission: RE | Admit: 2020-01-30 | Discharge: 2020-01-30 | Disposition: A | Discharge status: Home / Self Care | Payer: PPO | Source: Ambulatory Visit | Attending: Vascular Surgery | Admitting: Vascular Surgery

## 2020-01-30 DIAGNOSIS — E1142 Type 2 diabetes mellitus with diabetic polyneuropathy: Secondary | ICD-10-CM | POA: Diagnosis not present

## 2020-01-30 DIAGNOSIS — Z79891 Long term (current) use of opiate analgesic: Secondary | ICD-10-CM | POA: Diagnosis not present

## 2020-01-30 DIAGNOSIS — I739 Peripheral vascular disease, unspecified: Secondary | ICD-10-CM | POA: Diagnosis not present

## 2020-01-30 DIAGNOSIS — Z01818 Encounter for other preprocedural examination: Secondary | ICD-10-CM | POA: Insufficient documentation

## 2020-01-30 DIAGNOSIS — M4722 Other spondylosis with radiculopathy, cervical region: Secondary | ICD-10-CM | POA: Diagnosis not present

## 2020-01-30 DIAGNOSIS — Z20822 Contact with and (suspected) exposure to covid-19: Secondary | ICD-10-CM | POA: Diagnosis not present

## 2020-01-30 LAB — SARS CORONAVIRUS 2 (TAT 6-24 HRS): SARS Coronavirus 2: NEGATIVE

## 2020-01-31 ENCOUNTER — Other Ambulatory Visit: Payer: Self-pay

## 2020-01-31 ENCOUNTER — Encounter (HOSPITAL_COMMUNITY)
Admission: RE | Disposition: A | Discharge status: Home / Self Care | Payer: Self-pay | Source: Home / Self Care | Attending: Vascular Surgery

## 2020-01-31 ENCOUNTER — Encounter (HOSPITAL_COMMUNITY): Payer: Self-pay | Admitting: Vascular Surgery

## 2020-01-31 ENCOUNTER — Ambulatory Visit (HOSPITAL_COMMUNITY)
Admission: RE | Admit: 2020-01-31 | Discharge: 2020-01-31 | Disposition: A | Discharge status: Home / Self Care | Payer: PPO | Attending: Vascular Surgery | Admitting: Vascular Surgery

## 2020-01-31 DIAGNOSIS — F17210 Nicotine dependence, cigarettes, uncomplicated: Secondary | ICD-10-CM | POA: Insufficient documentation

## 2020-01-31 DIAGNOSIS — Z79899 Other long term (current) drug therapy: Secondary | ICD-10-CM | POA: Diagnosis not present

## 2020-01-31 DIAGNOSIS — Z7984 Long term (current) use of oral hypoglycemic drugs: Secondary | ICD-10-CM | POA: Insufficient documentation

## 2020-01-31 DIAGNOSIS — I10 Essential (primary) hypertension: Secondary | ICD-10-CM | POA: Diagnosis not present

## 2020-01-31 DIAGNOSIS — Z880 Allergy status to penicillin: Secondary | ICD-10-CM | POA: Insufficient documentation

## 2020-01-31 DIAGNOSIS — Z20822 Contact with and (suspected) exposure to covid-19: Secondary | ICD-10-CM | POA: Diagnosis not present

## 2020-01-31 DIAGNOSIS — E785 Hyperlipidemia, unspecified: Secondary | ICD-10-CM | POA: Diagnosis not present

## 2020-01-31 DIAGNOSIS — I70221 Atherosclerosis of native arteries of extremities with rest pain, right leg: Secondary | ICD-10-CM | POA: Insufficient documentation

## 2020-01-31 DIAGNOSIS — E1151 Type 2 diabetes mellitus with diabetic peripheral angiopathy without gangrene: Secondary | ICD-10-CM | POA: Insufficient documentation

## 2020-01-31 DIAGNOSIS — E1140 Type 2 diabetes mellitus with diabetic neuropathy, unspecified: Secondary | ICD-10-CM | POA: Insufficient documentation

## 2020-01-31 HISTORY — PX: ABDOMINAL AORTOGRAM W/LOWER EXTREMITY: CATH118223

## 2020-01-31 LAB — COMPREHENSIVE METABOLIC PANEL
ALT: 15 U/L (ref 0–44)
AST: 18 U/L (ref 15–41)
Albumin: 3.4 g/dL — ABNORMAL LOW (ref 3.5–5.0)
Alkaline Phosphatase: 70 U/L (ref 38–126)
Anion gap: 12 (ref 5–15)
BUN: 39 mg/dL — ABNORMAL HIGH (ref 6–20)
CO2: 23 mmol/L (ref 22–32)
Calcium: 9.6 mg/dL (ref 8.9–10.3)
Chloride: 101 mmol/L (ref 98–111)
Creatinine, Ser: 1.86 mg/dL — ABNORMAL HIGH (ref 0.61–1.24)
GFR, Estimated: 41 mL/min — ABNORMAL LOW (ref >60)
Glucose, Bld: 167 mg/dL — ABNORMAL HIGH (ref 70–99)
Potassium: 4.5 mmol/L (ref 3.5–5.1)
Sodium: 136 mmol/L (ref 135–145)
Total Bilirubin: 0.5 mg/dL (ref 0.3–1.2)
Total Protein: 6.9 g/dL (ref 6.5–8.1)

## 2020-01-31 LAB — TYPE AND SCREEN
ABO/RH(D): A POS
Antibody Screen: NEGATIVE

## 2020-01-31 LAB — URINALYSIS, ROUTINE W REFLEX MICROSCOPIC
Bilirubin Urine: NEGATIVE
Glucose, UA: 50 mg/dL — AB
Hgb urine dipstick: NEGATIVE
Ketones, ur: NEGATIVE mg/dL
Leukocytes,Ua: NEGATIVE
Nitrite: NEGATIVE
Protein, ur: 100 mg/dL — AB
Specific Gravity, Urine: 1.014 (ref 1.005–1.030)
pH: 5 (ref 5.0–8.0)

## 2020-01-31 LAB — POCT I-STAT, CHEM 8
BUN: 42 mg/dL — ABNORMAL HIGH (ref 6–20)
Calcium, Ion: 1.29 mmol/L (ref 1.15–1.40)
Chloride: 104 mmol/L (ref 98–111)
Creatinine, Ser: 1.8 mg/dL — ABNORMAL HIGH (ref 0.61–1.24)
Glucose, Bld: 183 mg/dL — ABNORMAL HIGH (ref 70–99)
HCT: 40 % (ref 39.0–52.0)
Hemoglobin: 13.6 g/dL (ref 13.0–17.0)
Potassium: 4.3 mmol/L (ref 3.5–5.1)
Sodium: 136 mmol/L (ref 135–145)
TCO2: 25 mmol/L (ref 22–32)

## 2020-01-31 LAB — CBC
HCT: 41.4 % (ref 39.0–52.0)
Hemoglobin: 13 g/dL (ref 13.0–17.0)
MCH: 29.7 pg (ref 26.0–34.0)
MCHC: 31.4 g/dL (ref 30.0–36.0)
MCV: 94.5 fL (ref 80.0–100.0)
Platelets: 436 10*3/uL — ABNORMAL HIGH (ref 150–400)
RBC: 4.38 MIL/uL (ref 4.22–5.81)
RDW: 12.6 % (ref 11.5–15.5)
WBC: 11 10*3/uL — ABNORMAL HIGH (ref 4.0–10.5)
nRBC: 0 % (ref 0.0–0.2)

## 2020-01-31 LAB — PROTIME-INR
INR: 1.1 (ref 0.8–1.2)
Prothrombin Time: 13.6 seconds (ref 11.4–15.2)

## 2020-01-31 LAB — GLUCOSE, CAPILLARY: Glucose-Capillary: 170 mg/dL — ABNORMAL HIGH (ref 70–99)

## 2020-01-31 LAB — SURGICAL PCR SCREEN
MRSA, PCR: NEGATIVE
Staphylococcus aureus: NEGATIVE

## 2020-01-31 LAB — HEMOGLOBIN A1C
Hgb A1c MFr Bld: 9.2 % — ABNORMAL HIGH (ref 4.8–5.6)
Mean Plasma Glucose: 217.34 mg/dL

## 2020-01-31 LAB — APTT: aPTT: 34 seconds (ref 24–36)

## 2020-01-31 LAB — SARS CORONAVIRUS 2 BY RT PCR (HOSPITAL ORDER, PERFORMED IN ~~LOC~~ HOSPITAL LAB): SARS Coronavirus 2: NEGATIVE

## 2020-01-31 SURGERY — ABDOMINAL AORTOGRAM W/LOWER EXTREMITY
Anesthesia: LOCAL

## 2020-01-31 MED ORDER — ACETAMINOPHEN 325 MG PO TABS: 650.0000 mg | ORAL_TABLET | ORAL | Status: DC | PRN

## 2020-01-31 MED ORDER — LIDOCAINE HCL (PF) 1 % IJ SOLN
INTRAMUSCULAR | Status: AC
  Filled 2020-01-31: qty 30

## 2020-01-31 MED ORDER — SODIUM CHLORIDE 0.9 % WEIGHT BASED INFUSION: 1.0000 mL/kg/h | INTRAVENOUS | Status: DC

## 2020-01-31 MED ORDER — HYDRALAZINE HCL 20 MG/ML IJ SOLN: 5.0000 mg | INTRAMUSCULAR | Status: DC | PRN

## 2020-01-31 MED ORDER — HEPARIN (PORCINE) IN NACL 1000-0.9 UT/500ML-% IV SOLN
INTRAVENOUS | Status: DC | PRN
  Administered 2020-01-31: 500 mL

## 2020-01-31 MED ORDER — SODIUM CHLORIDE 0.9 % IV SOLN: INTRAVENOUS | Status: DC

## 2020-01-31 MED ORDER — OXYCODONE HCL 5 MG PO TABS
10.0000 mg | ORAL_TABLET | Freq: Once | ORAL | Status: AC
  Administered 2020-01-31: 10 mg via ORAL

## 2020-01-31 MED ORDER — SODIUM CHLORIDE 0.9 % IV SOLN: 250.0000 mL | INTRAVENOUS | Status: DC | PRN

## 2020-01-31 MED ORDER — FENTANYL CITRATE (PF) 100 MCG/2ML IJ SOLN
INTRAMUSCULAR | Status: AC
  Filled 2020-01-31: qty 2

## 2020-01-31 MED ORDER — FENTANYL CITRATE (PF) 100 MCG/2ML IJ SOLN
INTRAMUSCULAR | Status: DC | PRN
  Administered 2020-01-31 (×2): 50 ug via INTRAVENOUS

## 2020-01-31 MED ORDER — LABETALOL HCL 5 MG/ML IV SOLN
10.0000 mg | INTRAVENOUS | Status: DC | PRN
Start: 2020-01-31 — End: 2020-01-31

## 2020-01-31 MED ORDER — SODIUM CHLORIDE 0.9% FLUSH: 3.0000 mL | INTRAVENOUS | Status: DC | PRN

## 2020-01-31 MED ORDER — SODIUM CHLORIDE 0.9% FLUSH: 3.0000 mL | Freq: Two times a day (BID) | INTRAVENOUS | Status: DC

## 2020-01-31 MED ORDER — ONDANSETRON HCL 4 MG/2ML IJ SOLN: 4.0000 mg | Freq: Four times a day (QID) | INTRAMUSCULAR | Status: DC | PRN

## 2020-01-31 MED ORDER — MIDAZOLAM HCL 2 MG/2ML IJ SOLN
INTRAMUSCULAR | Status: AC
  Filled 2020-01-31: qty 2

## 2020-01-31 MED ORDER — OXYCODONE HCL 5 MG PO TABS
ORAL_TABLET | ORAL | Status: AC
  Filled 2020-01-31: qty 2

## 2020-01-31 MED ORDER — IODIXANOL 320 MG/ML IV SOLN
INTRAVENOUS | Status: DC | PRN
  Administered 2020-01-31: 55 mL via INTRA_ARTERIAL

## 2020-01-31 MED ORDER — LIDOCAINE HCL (PF) 1 % IJ SOLN
INTRAMUSCULAR | Status: DC | PRN
  Administered 2020-01-31: 15 mL

## 2020-01-31 MED ORDER — HEPARIN (PORCINE) IN NACL 1000-0.9 UT/500ML-% IV SOLN
INTRAVENOUS | Status: AC
  Filled 2020-01-31: qty 1000

## 2020-01-31 SURGICAL SUPPLY — 15 items
CATH OMNI FLUSH 5F 65CM (CATHETERS) ×1 IMPLANT
CATH SOFT-VU 4F 65 STRAIGHT (CATHETERS) IMPLANT
CATH SOFT-VU STRAIGHT 4F 65CM (CATHETERS) ×2
DEVICE CLOSURE MYNXGRIP 5F (Vascular Products) ×1 IMPLANT
FILTER CO2 0.2 MICRON (VASCULAR PRODUCTS) ×2 IMPLANT
KIT MICROPUNCTURE NIT STIFF (SHEATH) ×1 IMPLANT
KIT PV (KITS) ×2 IMPLANT
RESERVOIR CO2 (VASCULAR PRODUCTS) ×1 IMPLANT
SET FLUSH CO2 (MISCELLANEOUS) ×1 IMPLANT
SHEATH PINNACLE 5F 10CM (SHEATH) ×1 IMPLANT
SHEATH PROBE COVER 6X72 (BAG) ×1 IMPLANT
SYR MEDRAD MARK V 150ML (SYRINGE) ×1 IMPLANT
TRANSDUCER W/STOPCOCK (MISCELLANEOUS) ×2 IMPLANT
TRAY PV CATH (CUSTOM PROCEDURE TRAY) ×2 IMPLANT
WIRE BENTSON .035X145CM (WIRE) ×1 IMPLANT

## 2020-01-31 NOTE — Pre-Procedure Instructions (Addendum)
Wayne Pratt  01/31/2020    Your procedure is scheduled on Monday, January, 24.  Report to [facility], Main Entrance or Entrance "A" at 7:20 AM                Your surgery or procedure is scheduled to begin at 7:20 AM   Call this number if you have problems the morning of surgery: [phone]  This is the number for the Pre- Surgical Desk.                For any other questions, please call [phone], Monday - Friday 8 AM - 4 PM.   Remember:  Do not eat or drink after midnight Sunday, January 23rd.   Take these medicines the morning of surgery with A SIP OF WATER: benzonatate (TESSALON)  clonazePAM (KLONOPIN) cyclobenzaprine (FLEXERIL) omeprazole (PRILOSEC) Oxycodone HCL pantoprazole (PROTONIX) predniSONE (DELTASONE)  methocarbamol (ROBAXIN)  Take if needed: Use Albuterol inhaler and bring it to the hospital. Hydromorphone or Oxycodone- Acetaminophen   WHAT DO I DO ABOUT MY DIABETES MEDICATION? DO NOT take Glipizide XL  Sunday Evening or Monday AM.  Do NOT any medications for diabetes on Monday am  o Check your blood sugar the morning of your surgery when you wake up and every 2 hours until you get to the Short Stay unit. . If your blood sugar is less than 70 mg/dL, you will need to treat for low blood sugar: o Treat a low blood sugar (less than 70 mg/dL) with  cup of clear juice (  apple), 4 glucose tablets, OR glucose gel. Recheck blood sugar in 15 minutes after treatment (to make sure it is greater than 70 mg/dL). If your blood sugar is not greater than 70 mg/dL on recheck, call [phone] for further instructions. . Report your blood sugar to the short stay nurse when you get to Short Stay.    Remember:  Do not eat or drink after midnight.      1 Week prior to surgery STOP taking Aspirin, Aspirin Products (Goody Powder, Excedrin Migraine), Ibuprofen (Advil), Naproxen (Aleve), Vitamins and Herbal Products (ie Fish Oil).    Special instructions:   DO NOT smoke within 24 hours of surgery  [facility]- Preparing For Surgery  Before surgery, you can play an important role. Because skin is not sterile, your skin needs to be as free of germs as possible. You can reduce the number of germs on your skin by washing with CHG (chlorahexidine gluconate) Soap before surgery.  CHG is an antiseptic cleaner which kills germs and bonds with the skin to continue killing germs even after washing.    Oral Hygiene is also important to reduce your risk of infection.  Remember - BRUSH YOUR TEETH THE MORNING OF SURGERY WITH YOUR REGULAR TOOTHPASTE  Please do not use if you have an allergy to CHG or antibacterial soaps. If your skin becomes reddened/irritated stop using the CHG.  Do not shave (including legs and underarms) for at least 48 hours prior to first CHG shower. It is OK to shave your face.  Please follow these instructions carefully.   1. Shower the NIGHT BEFORE SURGERY and the MORNING OF SURGERY with CHG.   2. If you chose to wash your hair, wash your hair first as usual with your normal shampoo.  3. After you shampoo, wash your face and private area with the soap you use at home, then rinse your hair and body thoroughly to  remove the shampoo and soap.  4. Use CHG as you would any other liquid soap. You can apply CHG directly to the skin and wash gently with a scrungie or a clean washcloth.   5. Apply the CHG Soap to your body ONLY FROM THE NECK DOWN.  Do not use on open wounds or open sores. Avoid contact with your eyes, ears, mouth and genitals (private parts).   6. Wash thoroughly, paying special attention to the area where your surgery will be performed.  7. Thoroughly rinse your body with warm water from the neck down.  8. DO NOT shower/wash with your normal soap after using and rinsing off the CHG Soap.  9. Pat yourself dry with a CLEAN TOWEL.  10. Wear CLEAN PAJAMAS to bed the night before surgery, wear comfortable clothes the morning  of surgery  11. Place CLEAN SHEETS on your bed the night of your first shower and DO NOT SLEEP WITH PETS.  Day of Surgery: Shower as instructed above. Do not apply any deodorants/lotions, powders or colognes.  Please wear clean clothes to the hospital/surgery center.   Remember to brush your teeth WITH YOUR REGULAR TOOTHPASTE.  Do not wear jewelry, make-up or nail polish.  Men may shave face and neck.  Do not bring valuables to the hospital.  [facility] is not responsible for any belongings or valuables.  Contacts, dentures or bridgework may not be worn into surgery.  Leave your suitcase in the car.  After surgery it may be brought to your room.  For patients admitted to the hospital, discharge time will be determined by your treatment team.

## 2020-01-31 NOTE — H&P (Signed)
History and Physical Interval Note:  01/31/2020 10:58 AM  Wayne Pratt  has presented today for surgery, with the diagnosis of lower limb ischemia.  The various methods of treatment have been discussed with the patient and family. After consideration of risks, benefits and other options for treatment, the patient has consented to  Procedure(s): ABDOMINAL AORTOGRAM W/LOWER EXTREMITY (N/A) as a surgical intervention.  The patient's history has been reviewed, patient examined, no change in status, stable for surgery.  I have reviewed the patient's chart and labs.  Questions were answered to the patient's satisfaction.    Aortogram, lower extremity arteriogram.  Evaluate for bypass target with occluded right CFA and profunda.  Marty Heck  Patient name: Wayne Pratt   MRN: [id number]        DOB: 07-Oct-1960        Sex: male  REASON FOR CONSULT: Right lower extremity pain and evaluate for PAD  HPI: Wayne Pratt is a 60 y.o. male, with history of hypertension, hyperlipidemia, DM, and tobacco abuse that presents for evaluation of right leg pain.  Patient states he noticed that his right leg was having a lot of cramping and discomfort that started back in deer season that really limited his activity.  Ultimately he was unable to deer hunt at that time.  He had progressive symptoms about 3 weeks ago where he had worsening pain in the leg to the point that it was bothering him all the time and he is now requiring narcotics.  He is having to dangle his foot off the side of the bed for relief.  Has had trouble wiggling toes for several weeks.  He is a smoker and smokes at least a pack a day.  He had a CTA on 01/23/2020 that showed occlusion of the right common femoral artery with occlusion of the profunda and also the proximal right SFA.  He does appear to have reconstitution of flow in the SFA with a small flow channel, the popliteal is open, and all three tibials are open with disease.         Past Medical History:  Diagnosis Date  . Anemia    takes Iron pill daily  . Anxiety    takes Xanax daily  . Arthritis   . Diabetes mellitus ([place])    takes Metformin daily  . History of bronchitis    mid 90's  . History of hypertension    lost weight and been off meds over a yr  . History of kidney stones   . History of pleural empyema   . Hypercholesterolemia    lost weight and been off meds over a yr  . Hypertension   . Insomnia    takes Trazodone nightly  . Kidney stones    22 times  . Neuropathy   . Pneumonia    hx of-15+ yrs ago  . Sleep apnea    Stop Bang score of 7  . Weakness    numbness and tingling         Past Surgical History:  Procedure Laterality Date  . BIOPSY  10/05/2011   Dr. Oneida Alar :non-erosive gastritis  . BIOPSY  08/06/2014   Procedure: BIOPSY;  Surgeon: Danie Binder, MD;  Location: [facility];  Service: Endoscopy;;  . CERVICAL DISC SURGERY    . ELBOW SURGERY Right   . ESOPHAGOGASTRODUODENOSCOPY  Sept 2013   Dr. Oneida Alar: chronic inactive gastritis   . ESOPHAGOGASTRODUODENOSCOPY N/A 08/18/2017   Procedure: ESOPHAGOGASTRODUODENOSCOPY (  EGD);  Surgeon: Milus Banister, MD;  Location: [facility];  Service: Endoscopy;  Laterality: N/A;  . ESOPHAGOGASTRODUODENOSCOPY N/A 03/09/2018   Procedure: ESOPHAGOGASTRODUODENOSCOPY (EGD);  Surgeon: Milus Banister, MD;  Location: [facility];  Service: Endoscopy;  Laterality: N/A;  . ESOPHAGOGASTRODUODENOSCOPY (EGD) WITH PROPOFOL N/A 08/06/2014   mild non-erosive gastritis, duodenitis  . EUS N/A 08/18/2017   Procedure: UPPER ENDOSCOPIC ULTRASOUND (EUS) RADIAL;  Surgeon: Milus Banister, MD;  Location: [facility];  Service: Endoscopy;  Laterality: N/A;  . EUS N/A 03/09/2018   Procedure: UPPER ENDOSCOPIC ULTRASOUND (EUS) RADIAL;  Surgeon: Milus Banister, MD;  Location: [facility];  Service: Endoscopy;  Laterality: N/A;  . FINE NEEDLE ASPIRATION  03/09/2018   Procedure:  FINE NEEDLE ASPIRATION;  Surgeon: Milus Banister, MD;  Location: [facility];  Service: Endoscopy;;  . FLEXIBLE SIGMOIDOSCOPY  10/05/2011   Dr. Fields:poor prep  . LUNG SURGERY  2010   for empyema  . SPINAL CORD STIMULATOR INSERTION N/A 08/01/2015   Procedure: Spinal Cord Stimulator Implant;  Surgeon: Eustace Moore, MD;  Location: [facility];  Service: Neurosurgery;  Laterality: N/A;  . TONSILLECTOMY    . VASECTOMY Bilateral 1995         Family History  Problem Relation Age of Onset  . Prostate cancer Other        all male members on mother's side  . Heart attack Father   . COPD Maternal Grandmother   . Cancer Paternal Grandmother   . Colon cancer Neg Hx     SOCIAL HISTORY: Social History        Socioeconomic History  . Marital status: Married    Spouse name: Not on file  . Number of children: 2  . Years of education: Not on file  . Highest education level: Not on file  Occupational History  . Occupation: disabled    Comment: 2005  Tobacco Use  . Smoking status: Current Every Day Smoker    Packs/day: 1.00    Years: 39.00    Pack years: 39.00    Types: Cigarettes  . Smokeless tobacco: Never Used  Vaping Use  . Vaping Use: Never used  Substance and Sexual Activity  . Alcohol use: No    Alcohol/week: 0.0 standard drinks  . Drug use: No  . Sexual activity: Yes    Birth control/protection: None  Other Topics Concern  . Not on file  Social History Narrative  . Not on file   Social Determinants of Health   Financial Resource Strain: Not on file  Food Insecurity: Not on file  Transportation Needs: Not on file  Physical Activity: Not on file  Stress: Not on file  Social Connections: Not on file  Intimate Partner Violence: Not on file         Allergies  Allergen Reactions  . Penicillins Anaphylaxis and Other (See Comments)    Has patient had a PCN reaction causing immediate rash, facial/tongue/throat swelling, SOB  or lightheadedness with hypotension: Yes Has patient had a PCN reaction causing severe rash involving mucus membranes or skin necrosis: Yes Has patient had a PCN reaction that required hospitalization: No Has patient had a PCN reaction occurring within the last 10 years: No If all of the above answers are "NO", then may proceed with Cephalosporin use.   . Ativan [Lorazepam] Other (See Comments)    Violent and Mean           Current Outpatient Medications  Medication Sig Dispense  Refill  . albuterol (VENTOLIN HFA) 108 (90 Base) MCG/ACT inhaler Inhale 1-2 puffs into the lungs every 6 (six) hours as needed for wheezing or shortness of breath. 18 g 0  . clonazePAM (KLONOPIN) 0.5 MG tablet Take 0.5 mg by mouth 2 (two) times daily.     . cyclobenzaprine (FLEXERIL) 10 MG tablet Take 10 mg by mouth 3 (three) times daily as needed for muscle spasms.     [name] Kitchen GLIPIZIDE XL 10 MG 24 hr tablet Take 10 mg by mouth 2 (two) times daily.    . IBU 800 MG tablet Take 800 mg by mouth daily as needed for headache.     . lisinopril (ZESTRIL) 10 MG tablet TAKE 1 TABLET BY MOUTH ONCE A DAY. 90 tablet 0  . lovastatin (MEVACOR) 40 MG tablet Take 40 mg by mouth at bedtime.     . metFORMIN (GLUCOPHAGE) 1000 MG tablet Take 1,000 mg by mouth 2 (two) times daily.     . metoprolol tartrate (LOPRESSOR) 100 MG tablet TAKE 1 TABLET 2 HR PRIOR TO CARDIAC PROCEDURE 1 tablet 0  . naproxen sodium (ALEVE) 220 MG tablet Take 440 mg by mouth daily as needed (for pain or headache).    . Oxycodone HCl 10 MG TABS Take 10 mg by mouth every 4 (four) hours.     . pioglitazone (ACTOS) 15 MG tablet Take 15 mg by mouth daily.    [name] Kitchen azithromycin (ZITHROMAX) 250 MG tablet Take 1 tablet (250 mg total) by mouth daily. Take first 2 tablets together, then 1 every day until finished. 6 tablet 0  . benzonatate (TESSALON) 100 MG capsule Take 1 capsule (100 mg total) by mouth every 8 (eight) hours. 60 capsule 0  . HYDROmorphone  (DILAUDID) 4 MG tablet Take 1 tablet (4 mg total) by mouth every 6 (six) hours as needed for severe pain. 20 tablet 0  . morphine (MSIR) 15 MG tablet Take 1 tablet by mouth every 4 (four) hours as needed for pain.    [name] Kitchen omeprazole (PRILOSEC) 20 MG capsule TAKE 1 CAPSULE ONCE DAILY 30 MINUTES BEFORE YOUR FIRST MEAL. (Patient not taking: Reported on 01/29/2020) 30 capsule 5  . ondansetron (ZOFRAN ODT) 4 MG disintegrating tablet 4mg  ODT q4 hours prn nausea/vomit 12 tablet 0  . ondansetron (ZOFRAN) 8 MG tablet Take 1 tablet (8 mg total) by mouth every 4 (four) hours as needed. 10 tablet 0  . oxyCODONE-acetaminophen (PERCOCET) 5-325 MG tablet Take 1 tablet by mouth every 4 (four) hours as needed. 15 tablet 0  . pantoprazole (PROTONIX) 40 MG tablet TAKE 1 TABLET BY MOUTH 30 MINUTES PRIOR TO BREAKFAST. (Patient taking differently: Take 40 mg by mouth daily. ) 90 tablet 3  . predniSONE (DELTASONE) 10 MG tablet Take 2 tablets (20 mg total) by mouth daily. 15 tablet 0   No current facility-administered medications for this visit.    REVIEW OF SYSTEMS:  [X]  denotes positive finding, [ ]  denotes negative finding Cardiac  Comments:  Chest pain or chest pressure:    Shortness of breath upon exertion:    Short of breath when lying flat:    Irregular heart rhythm:        Vascular    Pain in calf, thigh, or hip brought on by ambulation: x   Pain in feet at night that wakes you up from your sleep:  x   Blood clot in your veins:    Leg swelling:  Pulmonary    Oxygen at home:    Productive cough:     Wheezing:         Neurologic    Sudden weakness in arms or legs:     Sudden numbness in arms or legs:     Sudden onset of difficulty speaking or slurred speech:    Temporary loss of vision in one eye:     Problems with dizziness:         Gastrointestinal    Blood in stool:     Vomited blood:         Genitourinary    Burning when  urinating:     Blood in urine:        Psychiatric    Major depression:         Hematologic    Bleeding problems:    Problems with blood clotting too easily:        Skin    Rashes or ulcers:        Constitutional    Fever or chills:      PHYSICAL EXAM:    Vitals:   01/29/20 1340  BP: 122/77  Pulse: 87  Resp: 18  Temp: 98.3 F (36.8 C)  TempSrc: Temporal  SpO2: 97%  Weight: 234 lb (106.1 kg)  Height: 6\' 1"  (1.854 m)    GENERAL: The patient is a well-nourished male, in no acute distress. The vital signs are documented above. CARDIAC: There is a regular rate and rhythm.  VASCULAR:  Left femoral pulse 2+ palpable Right femoral pulse 1+ palpable Right foot with monophasic PT signal Difficulty wiggling toes Left DP palpable PULMONARY: No respiratory distress. ABDOMEN: Soft and non-tender. MUSCULOSKELETAL: There are no major deformities or cyanosis. NEUROLOGIC: No focal weakness or paresthesias are detected. SKIN: There are no ulcers or rashes noted. PSYCHIATRIC: The patient has a normal affect.  DATA:   ABIs at the right ankle were zero in the office  I independently reviewed CTA abdomen pelvis with runoff from 01/23/2020 and distal right common femoral artery is occluded and the proximal profunda is occluded with distal reconstitution and the proximal SFA is occluded but does have a flow channel with a patent popliteal and diseased three-vessel runoff.  Assessment/Plan:  60 year old male that presents with critical limb ischemia of the right lower extremity with severe rest pain.  I reviewed his CTA abdomen pelvis with runoff and appears he has an occluded common femoral and also an occluded profunda with an occluded proximal SFA.  He does reconstitute what appears to be a flow channel in the SFA and has diseased three-vessel runoff.  I think he ultimately is going to require right common femoral endarterectomy with  profundoplasty and a possible femoropopliteal bypass.  I have recommended arteriogram on Thursday to evaluate for bypass target given that he was having disabling claudication in his right leg during hunting season likely before his common femoral occluded.  We will get him vein mapped in the office today.  I discussed after his arteriogram on Thursday I will have him set up for femoral endarterectomy, profundoplasty, and possible femoropopliteal bypass on Monday.  He understands he is at high risk for limb loss.     Marty Heck, MD [facility] Office: [phone]

## 2020-01-31 NOTE — Progress Notes (Addendum)
PCP -  Dr. [name]  Cardiologist - saw Dr Levada Dy x1- 01/22/19- for chest pain that had last 3 days- included in the plans was a 3 month follow up- patient did not follow up; Wayne Pratt denies chest pain.    Chest x-ray - 03/15/19  EKG - 01/31/20  Stress Test -   ECHO - 2021  Cardiac Cath -   Sleep Study - Yes- years ago- lost a lot of weight and "does not have it now." LABS-no -   ASA-na  ERAS-no  HA1C-01/31/20 Fasting Blood Sugar - 124 Checks Blood Sugar __1___ times a day  Anesthesia-  Pt denies having chest pain, sob, or fever at this time. All instructions explained to the pt, with a verbal understanding of the material. Pt agrees to go over the instructions while at home for a better understanding. Pt also instructed to self quarantine after being tested for COVID-19. The opportunity to ask questions was provided.

## 2020-01-31 NOTE — Discharge Instructions (Signed)
Angiogram, Care After  HOLD METFORMIN FOR 6 HOURS  This sheet gives you information about how to care for yourself after your procedure. Your health care provider may also give you more specific instructions. If you have problems or questions, contact your health care provider. What can I expect after the procedure? After the procedure, it is common to have:  Bruising and tenderness at the catheter insertion area.  A collection of blood (hematoma) at the insertion area. This may feel like a small lump under the skin at the insertion site. Follow these instructions at home: Insertion site care  Follow instructions from your health care provider about how to take care of your insertion site. Make sure you: ? Wash your hands with soap and water before and after you change your bandage (dressing). If soap and water are not available, use hand sanitizer. ? Change your dressing as told by your health care provider.  Do not take baths, swim, or use a hot tub until your health care provider approves.  You may shower 24-48 hours after the procedure, or as told by your health care provider. To clean the insertion site: ? Gently wash the area with plain soap and water. ? Pat the area dry with a clean towel. ? Do not rub the site. This may cause bleeding.  Check your insertion site every day for signs of infection. Check for: ? Redness, swelling, or pain. ? Fluid or blood. ? Warmth. ? Pus or a bad smell.  Do not apply powder or lotion to the site. Keep the site clean and dry.   Activity  Do not drive for 24 hours if you were given a sedative during your procedure.  Rest as told by your health care provider, usually for 1-2 days.  Do not lift anything that is heavier than 10 lb (4.5 kg), or the limit that you are told, until your health care provider says that it is safe.  If the insertion site was in your leg, try to avoid stairs for a few days.  Return to your normal activities as  told by your health care provider, usually in about a week. Ask your health care provider what activities are safe for you. General instructions  If your insertion site starts bleeding, lie flat and put pressure on the site. If the bleeding does not stop, get help right away. This is a medical emergency.  Take over-the-counter and prescription medicines only as told by your health care provider.  Drink enough fluid to keep your urine pale yellow. This helps flush the contrast dye from your body.  Keep all follow-up visits as told by your health care provider. This is important.   Contact a health care provider if:  You have a fever or chills.  You have redness, swelling, or pain around your insertion site.  You have fluid or blood coming from your insertion site.  Your insertion site feels warm to the touch.  You have pus or a bad smell coming from your insertion site.  You have more bruising around the insertion site. Get help right away if you have:  A problem with the insertion area, such as: ? The area swells fast or bleeds even after you apply pressure. ? The area becomes pale, cool, tingly, or numb.  Chest pain.  Trouble breathing.  A rash.  Any symptoms of a stroke. "BE FAST" is an easy way to remember the main warning signs of a stroke: ? B -  Balance. Signs are dizziness, sudden trouble walking, or loss of balance. ? E - Eyes. Signs are trouble seeing or a sudden change in vision. ? F - Face. Signs are sudden weakness or loss of feeling of the face, or the face or eyelid drooping on one side. ? A - Arms. Signs are weakness or loss of feeling in an arm. This happens suddenly and usually on one side of the body. ? S - Speech. Signs are sudden trouble speaking, slurred speech, or trouble understanding what people say. ? T - Time. Time to call emergency services. Write down what time symptoms started.  You have other signs of a stroke, such as: ? A sudden, severe  headache with no known cause. ? Nausea or vomiting. ? Seizure. These symptoms may represent a serious problem that is an emergency. Do not wait to see if the symptoms will go away. Get medical help right away. Call your local emergency services (911 in the [place] Do not drive yourself to the hospital. Summary  It is common to have bruising and tenderness at the catheter insertion area.  Do not take baths, swim, or use a hot tub until your health care provider approves. You may shower 24-48 hours after the procedure or as told.  It is important to rest and drink plenty of fluids.  If the insertion site bleeds, lie flat and put pressure on the site. If the bleeding continues, get help right away. This is a medical emergency. This information is not intended to replace advice given to you by your health care provider. Make sure you discuss any questions you have with your health care provider. Document Revised: 11/01/2018 Document Reviewed: 11/01/2018 Elsevier Patient Education  [place].

## 2020-01-31 NOTE — Op Note (Signed)
    Patient name: Wayne Pratt MRN: [id number] DOB: 01/28/1960 Sex: male  01/31/2020 Pre-operative Diagnosis: Critical limb ischemia of the right lower extremity with rest pain Post-operative diagnosis:  Same Surgeon:  Marty Heck, MD Procedure Performed: 1.  Ultrasound-guided access of the left common femoral artery 2.  CO2 aortogram including catheter selection of aorta 3.  Right lower extremity arteriogram with selection of second-order branches 4.  Mynx closure left common femoral artery  Indications: 60 year old male presented to clinic on Tuesday with worsening rest pain in the right lower extremity over the last 3 to 4 weeks and had a CTA showing distal common femoral and profunda occlusion.  CT did show distal reconstitution of SFA with what look like diseased runoff in the tibial vessels.  He presents today for planned arteriogram for bypass planning after risks and benefits discussed.  Findings:   CO2 aortogram showed no flow-limiting stenosis and widely patent aortoiliac segment bilaterally.  We attempted to do right leg arteriogram with CO2 but unable to visualize any targets.  Ultimately we had to use contrast.  This showed that his right common femoral artery and proximal profunda are occluded with reconstitution of a distal profunda and ultimately he does reconstitute a diseased distal SFA with a patent above and below-knee popliteal artery and dominant runoff in the right lower extremity via a diseased posterior tibial that is patent into the foot and a very diminutive peroneal artery.   Procedure:  The patient was identified in the holding area and taken to room 8.  The patient was then placed supine on the table and prepped and draped in the usual sterile fashion.  A time out was called.  Ultrasound was used to evaluate the left common femoral artery.  It was patent .  A digital ultrasound image was acquired.  A micropuncture needle was used to access the left common  femoral artery under ultrasound guidance.  An 018 wire was advanced without resistance and a micropuncture sheath was placed.  The 018 wire was removed and a benson wire was placed.  The micropuncture sheath was exchanged for a 5 french sheath.  An omniflush catheter was advanced over the wire to the level of L-1.  An abdominal angiogram was obtained with CO2.  Next, using the omniflush catheter and a benson wire, the aortic bifurcation was crossed and the catheter was placed into theright external iliac artery.  Unfortunately I could not get the catheter to track so we had to use a straight 4 French flush catheter.  We attempted to CO2 hand-inject down the right lower extremity but could not visualize any targets.  Ultimately ended up using a Rosen wire and then exchanging for an [employer] catheter with more wire support down the right iliac artery and finally got a dedicated right leg runoff with limited contrast.  It appears he would be a candidate for a femoral popliteal bypass in addition to common femoral endarterectomy.  Wires and catheters were removed.  A mynx closure device was deployed in the left common femoral artery.   Plan: Patient is scheduled for right common femoral endarterectomy with profundoplasty on Monday and a common femoral to popliteal artery bypass  Marty Heck, MD [facility]: [phone]

## 2020-02-01 ENCOUNTER — Encounter (HOSPITAL_COMMUNITY): Payer: Self-pay | Admitting: Vascular Surgery

## 2020-02-01 MED ORDER — VANCOMYCIN HCL 1500 MG/300ML IV SOLN
1500.0000 mg | INTRAVENOUS | Status: AC
  Administered 2020-02-04: 1500 mg via INTRAVENOUS
  Filled 2020-02-01 (×3): qty 300

## 2020-02-01 NOTE — Progress Notes (Signed)
Anesthesia Chart Review:  In January 2021 patient was seen at the emergency department for a 3-day episode of chest pain.  CTA chest abdomen pelvis was done which showed no evidence of aortic dissection, but did show significant multivessel coronary calcifications.  High-sensitivity troponins were negative x3.  EKG did not show ischemic changes.  He had outpatient follow-up with cardiologist Dr. Gardiner Rhyme on 01/22/2019.  Per note, "Chest pain: Atypical in description, as describes substernal pressure not related to stress or exertion.  However he does have significant risk factors for coronary artery disease, and recent CTA chest showed multivessel coronary calcifications. -Coronary CTA -TTE."  Patient did have the echo, which showed no significant abnormalities, he did not have the CTA and did not follow-up with cardiology as recommended.  Poorly controlled type 2 diabetes, last A1c 9.2 on 01/31/2020.  Creatinine elevated on preop labs, 1.86.  Likely related to use of contrast media during aortogram earlier the same day.  Review of previous labs shows baseline creatinine to be around 1.0.  Remainder of preop labs unremarkable.  Reviewed history and labs with Dr. Marcie Bal.  He advised okay to proceed as planned given nature of procedure, repeat creatinine on day of surgery.  EKG 01/31/2020: NSR.  Rate 95.  TTE 02/14/2019: 1. Left ventricular ejection fraction, by visual estimation, is 50 to  55%. The left ventricle has low normal function. There is mildly increased  left ventricular hypertrophy.  2. Left ventricular diastolic parameters are consistent with Grade I  diastolic dysfunction (impaired relaxation).  3. Mildly dilated left ventricular internal cavity size.  4. The left ventricle has no definite regional wall motion abnormalities.  5. Global right ventricle has normal systolic function.The right  ventricular size is normal. No increase in right ventricular wall  thickness.  6. Left  atrial size was normal.  7. Right atrial size was normal.  8. The mitral valve is grossly normal. Trivial mitral valve  regurgitation.  9. The tricuspid valve is grossly normal.  10. The tricuspid valve is grossly normal. Tricuspid valve regurgitation  is trivial.  11. The aortic valve is tricuspid. Left coronary cusp is mildly thickened  and has restricted motion. Aortic valve regurgitation is not visualized.  12. The pulmonic valve was not well visualized. Pulmonic valve  regurgitation is not visualized.  13. Aortic dilatation noted.  14. There is mild dilatation of the ascending aorta.  15. TR signal is inadequate for assessing pulmonary artery systolic  pressure.  16. The inferior vena cava is normal in size with greater than 50%  respiratory variability, suggesting right atrial pressure of 3 mmHg.    Wynonia Musty [facility]/Anesthesiology Phone [phone] 02/01/2020 10:18 AM

## 2020-02-01 NOTE — Anesthesia Preprocedure Evaluation (Addendum)
Anesthesia Evaluation  Patient identified by MRN, date of birth, ID band Patient awake    Reviewed: NPO status , Patient's Chart, lab work & pertinent test results  Airway Mallampati: II  TM Distance: >3 FB Neck ROM: Full    Dental  (+) Upper Dentures, Lower Dentures   Pulmonary asthma , sleep apnea , Current Smoker and Patient abstained from smoking.,    Pulmonary exam normal        Cardiovascular hypertension, + Peripheral Vascular Disease   Rhythm:Regular Rate:Normal     Neuro/Psych  Headaches, Anxiety    GI/Hepatic Neg liver ROS, GERD  Medicated and Controlled,  Endo/Other  diabetes, Type 2, Insulin Dependent, Oral Hypoglycemic Agents  Renal/GU Renal disease   History of stones    Musculoskeletal  (+) Arthritis ,   Abdominal (+)  Abdomen: soft. Bowel sounds: normal.  Peds  Hematology  (+) anemia ,   Anesthesia Other Findings   Reproductive/Obstetrics                            Anesthesia Physical Anesthesia Plan  ASA: III  Anesthesia Plan: General   Post-op Pain Management:    Induction: Intravenous  PONV Risk Score and Plan: 1 and Ondansetron, Dexamethasone, Treatment may vary due to age or medical condition and Midazolam  Airway Management Planned: Mask and Oral ETT  Additional Equipment: Arterial line  Intra-op Plan:   Post-operative Plan: Extubation in OR  Informed Consent: I have reviewed the patients History and Physical, chart, labs and discussed the procedure including the risks, benefits and alternatives for the proposed anesthesia with the patient or authorized representative who has indicated his/her understanding and acceptance.     Dental advisory given  Plan Discussed with: CRNA  Anesthesia Plan Comments: (Lab Results      Component                Value               Date                      WBC                      11.0 (H)            01/31/2020                 HGB                      13.0                01/31/2020                HCT                      41.4                01/31/2020                MCV                      94.5                01/31/2020                PLT  436 (H)             01/31/2020           Lab Results      Component                Value               Date                      NA                       134 (L)             02/04/2020                K                        4.4                 02/04/2020                CO2                      20 (L)              02/04/2020                GLUCOSE                  212 (H)             02/04/2020                BUN                      47 (H)              02/04/2020                CREATININE               1.54 (H)            02/04/2020                CALCIUM                  10.0                02/04/2020                GFRNONAA                 51 (L)              02/04/2020                GFRAA                    >60                 01/18/2019           PAT note by Karoline Caldwell, PA-C: In January 2021 patient was seen at the emergency department for a 3-day episode of chest pain.  CTA chest abdomen pelvis was done which showed no evidence of aortic dissection, but did show significant multivessel coronary calcifications.  High-sensitivity troponins were negative x3.  EKG did not show ischemic changes.  He had outpatient  follow-up with cardiologist Dr. Gardiner Rhyme on 01/22/2019.  Per note, "Chest pain: Atypical in description, as describes substernal pressure not related to stress or exertion.  However he does have significant risk factors for coronary artery disease, and recent CTA chest showed multivessel coronary calcifications. -Coronary CTA -TTE."  Patient did have the echo, which showed no significant abnormalities, he did not have the CTA and did not follow-up with cardiology as recommended.  Poorly controlled type 2 diabetes, last A1c 9.2 on  01/31/2020.  Creatinine elevated on preop labs, 1.86.  Likely related to use of contrast media during aortogram earlier the same day.  Review of previous labs shows baseline creatinine to be around 1.0.  Remainder of preop labs unremarkable.  Reviewed history and labs with Dr. Marcie Bal.  He advised okay to proceed as planned given nature of procedure, repeat creatinine on day of surgery.  EKG 01/31/2020: NSR.  Rate 95.  TTE 02/14/2019: 1. Left ventricular ejection fraction, by visual estimation, is 50 to  55%. The left ventricle has low normal function. There is mildly increased  left ventricular hypertrophy.  2. Left ventricular diastolic parameters are consistent with Grade I  diastolic dysfunction (impaired relaxation).  3. Mildly dilated left ventricular internal cavity size.  4. The left ventricle has no definite regional wall motion abnormalities.  5. Global right ventricle has normal systolic function.The right  ventricular size is normal. No increase in right ventricular wall  thickness.  6. Left atrial size was normal.  7. Right atrial size was normal.  8. The mitral valve is grossly normal. Trivial mitral valve  regurgitation.  9. The tricuspid valve is grossly normal.  10. The tricuspid valve is grossly normal. Tricuspid valve regurgitation  is trivial.  11. The aortic valve is tricuspid. Left coronary cusp is mildly thickened  and has restricted motion. Aortic valve regurgitation is not visualized.  12. The pulmonic valve was not well visualized. Pulmonic valve  regurgitation is not visualized.  13. Aortic dilatation noted.  14. There is mild dilatation of the ascending aorta.  15. TR signal is inadequate for assessing pulmonary artery systolic  pressure.  16. The inferior vena cava is normal in size with greater than 50%  respiratory variability, suggesting right atrial pressure of 3 mmHg.   )       Anesthesia Quick Evaluation

## 2020-02-04 ENCOUNTER — Encounter (HOSPITAL_COMMUNITY)
Admission: RE | Disposition: A | Discharge status: Home Health | Payer: Self-pay | Source: Home / Self Care | Attending: Vascular Surgery

## 2020-02-04 ENCOUNTER — Inpatient Hospital Stay (HOSPITAL_COMMUNITY): Payer: PPO | Admitting: Physician Assistant

## 2020-02-04 ENCOUNTER — Other Ambulatory Visit: Payer: Self-pay

## 2020-02-04 ENCOUNTER — Other Ambulatory Visit: Payer: PPO

## 2020-02-04 ENCOUNTER — Encounter (HOSPITAL_COMMUNITY): Payer: Self-pay | Admitting: Vascular Surgery

## 2020-02-04 ENCOUNTER — Inpatient Hospital Stay (HOSPITAL_COMMUNITY)
Admission: RE | Admit: 2020-02-04 | Discharge: 2020-02-07 | DRG: 254 | Disposition: A | Discharge status: Home Health | Payer: PPO | Attending: Vascular Surgery | Admitting: Vascular Surgery

## 2020-02-04 DIAGNOSIS — Z87442 Personal history of urinary calculi: Secondary | ICD-10-CM

## 2020-02-04 DIAGNOSIS — Z79899 Other long term (current) drug therapy: Secondary | ICD-10-CM

## 2020-02-04 DIAGNOSIS — G4700 Insomnia, unspecified: Secondary | ICD-10-CM | POA: Diagnosis present

## 2020-02-04 DIAGNOSIS — Z8042 Family history of malignant neoplasm of prostate: Secondary | ICD-10-CM

## 2020-02-04 DIAGNOSIS — F17210 Nicotine dependence, cigarettes, uncomplicated: Secondary | ICD-10-CM | POA: Diagnosis present

## 2020-02-04 DIAGNOSIS — Z880 Allergy status to penicillin: Secondary | ICD-10-CM | POA: Diagnosis not present

## 2020-02-04 DIAGNOSIS — F419 Anxiety disorder, unspecified: Secondary | ICD-10-CM | POA: Diagnosis not present

## 2020-02-04 DIAGNOSIS — G8929 Other chronic pain: Secondary | ICD-10-CM | POA: Diagnosis present

## 2020-02-04 DIAGNOSIS — E7800 Pure hypercholesterolemia, unspecified: Secondary | ICD-10-CM | POA: Diagnosis not present

## 2020-02-04 DIAGNOSIS — I70221 Atherosclerosis of native arteries of extremities with rest pain, right leg: Secondary | ICD-10-CM | POA: Diagnosis not present

## 2020-02-04 DIAGNOSIS — Z79891 Long term (current) use of opiate analgesic: Secondary | ICD-10-CM | POA: Diagnosis not present

## 2020-02-04 DIAGNOSIS — I998 Other disorder of circulatory system: Secondary | ICD-10-CM | POA: Diagnosis present

## 2020-02-04 DIAGNOSIS — I10 Essential (primary) hypertension: Secondary | ICD-10-CM | POA: Diagnosis not present

## 2020-02-04 DIAGNOSIS — E785 Hyperlipidemia, unspecified: Secondary | ICD-10-CM | POA: Diagnosis present

## 2020-02-04 DIAGNOSIS — Z825 Family history of asthma and other chronic lower respiratory diseases: Secondary | ICD-10-CM | POA: Diagnosis not present

## 2020-02-04 DIAGNOSIS — E1151 Type 2 diabetes mellitus with diabetic peripheral angiopathy without gangrene: Secondary | ICD-10-CM | POA: Diagnosis not present

## 2020-02-04 DIAGNOSIS — Z8249 Family history of ischemic heart disease and other diseases of the circulatory system: Secondary | ICD-10-CM

## 2020-02-04 DIAGNOSIS — Z7984 Long term (current) use of oral hypoglycemic drugs: Secondary | ICD-10-CM

## 2020-02-04 DIAGNOSIS — K219 Gastro-esophageal reflux disease without esophagitis: Secondary | ICD-10-CM | POA: Diagnosis not present

## 2020-02-04 DIAGNOSIS — R531 Weakness: Secondary | ICD-10-CM | POA: Diagnosis not present

## 2020-02-04 DIAGNOSIS — M79606 Pain in leg, unspecified: Secondary | ICD-10-CM | POA: Diagnosis present

## 2020-02-04 DIAGNOSIS — Z888 Allergy status to other drugs, medicaments and biological substances status: Secondary | ICD-10-CM

## 2020-02-04 DIAGNOSIS — M545 Low back pain, unspecified: Secondary | ICD-10-CM | POA: Diagnosis present

## 2020-02-04 DIAGNOSIS — G4730 Sleep apnea, unspecified: Secondary | ICD-10-CM | POA: Diagnosis not present

## 2020-02-04 HISTORY — PX: PATCH ANGIOPLASTY: SHX6230

## 2020-02-04 HISTORY — DX: Malignant (primary) neoplasm, unspecified: C80.1

## 2020-02-04 HISTORY — DX: Headache, unspecified: R51.9

## 2020-02-04 HISTORY — PX: FEMORAL-POPLITEAL BYPASS GRAFT: SHX937

## 2020-02-04 HISTORY — PX: VEIN HARVEST: SHX6363

## 2020-02-04 HISTORY — PX: ENDARTERECTOMY FEMORAL: SHX5804

## 2020-02-04 HISTORY — DX: Unspecified asthma, uncomplicated: J45.909

## 2020-02-04 LAB — POCT I-STAT 7, (LYTES, BLD GAS, ICA,H+H)
Acid-base deficit: 4 mmol/L — ABNORMAL HIGH (ref 0.0–2.0)
Bicarbonate: 23.1 mmol/L (ref 20.0–28.0)
Calcium, Ion: 1.3 mmol/L (ref 1.15–1.40)
HCT: 33 % — ABNORMAL LOW (ref 39.0–52.0)
Hemoglobin: 11.2 g/dL — ABNORMAL LOW (ref 13.0–17.0)
O2 Saturation: 100 %
Potassium: 4.5 mmol/L (ref 3.5–5.1)
Sodium: 136 mmol/L (ref 135–145)
TCO2: 24 mmol/L (ref 22–32)
pCO2 arterial: 47.2 mmHg (ref 32.0–48.0)
pH, Arterial: 7.297 — ABNORMAL LOW (ref 7.350–7.450)
pO2, Arterial: 210 mmHg — ABNORMAL HIGH (ref 83.0–108.0)

## 2020-02-04 LAB — BASIC METABOLIC PANEL
Anion gap: 14 (ref 5–15)
BUN: 47 mg/dL — ABNORMAL HIGH (ref 6–20)
CO2: 20 mmol/L — ABNORMAL LOW (ref 22–32)
Calcium: 10 mg/dL (ref 8.9–10.3)
Chloride: 100 mmol/L (ref 98–111)
Creatinine, Ser: 1.54 mg/dL — ABNORMAL HIGH (ref 0.61–1.24)
GFR, Estimated: 51 mL/min — ABNORMAL LOW (ref >60)
Glucose, Bld: 212 mg/dL — ABNORMAL HIGH (ref 70–99)
Potassium: 4.4 mmol/L (ref 3.5–5.1)
Sodium: 134 mmol/L — ABNORMAL LOW (ref 135–145)

## 2020-02-04 LAB — CBC
HCT: 32.8 % — ABNORMAL LOW (ref 39.0–52.0)
Hemoglobin: 10.8 g/dL — ABNORMAL LOW (ref 13.0–17.0)
MCH: 30.5 pg (ref 26.0–34.0)
MCHC: 32.9 g/dL (ref 30.0–36.0)
MCV: 92.7 fL (ref 80.0–100.0)
Platelets: 435 10*3/uL — ABNORMAL HIGH (ref 150–400)
RBC: 3.54 MIL/uL — ABNORMAL LOW (ref 4.22–5.81)
RDW: 12.7 % (ref 11.5–15.5)
WBC: 18 10*3/uL — ABNORMAL HIGH (ref 4.0–10.5)
nRBC: 0 % (ref 0.0–0.2)

## 2020-02-04 LAB — POCT ACTIVATED CLOTTING TIME
Activated Clotting Time: 297 seconds
Activated Clotting Time: 219 seconds
Activated Clotting Time: 237 seconds
Activated Clotting Time: 238 seconds
Activated Clotting Time: 261 seconds

## 2020-02-04 LAB — GLUCOSE, CAPILLARY
Glucose-Capillary: 235 mg/dL — ABNORMAL HIGH (ref 70–99)
Glucose-Capillary: 230 mg/dL — ABNORMAL HIGH (ref 70–99)

## 2020-02-04 SURGERY — ENDARTERECTOMY, FEMORAL
Anesthesia: General | Site: Leg Upper | Laterality: Right

## 2020-02-04 MED ORDER — ONDANSETRON HCL 4 MG/2ML IJ SOLN
4.0000 mg | Freq: Four times a day (QID) | INTRAMUSCULAR | Status: DC | PRN
  Administered 2020-02-05: 4 mg via INTRAVENOUS
  Filled 2020-02-04: qty 2

## 2020-02-04 MED ORDER — DEXAMETHASONE SODIUM PHOSPHATE 10 MG/ML IJ SOLN
INTRAMUSCULAR | Status: DC | PRN
  Administered 2020-02-04: 4 mg via INTRAVENOUS

## 2020-02-04 MED ORDER — POTASSIUM CHLORIDE CRYS ER 20 MEQ PO TBCR: 20.0000 meq | EXTENDED_RELEASE_TABLET | Freq: Every day | ORAL | Status: DC | PRN

## 2020-02-04 MED ORDER — DEXAMETHASONE SODIUM PHOSPHATE 10 MG/ML IJ SOLN
INTRAMUSCULAR | Status: AC
  Filled 2020-02-04: qty 1

## 2020-02-04 MED ORDER — SODIUM CHLORIDE 0.9 % IV SOLN: INTRAVENOUS | Status: AC

## 2020-02-04 MED ORDER — FENTANYL CITRATE (PF) 250 MCG/5ML IJ SOLN
INTRAMUSCULAR | Status: AC
  Filled 2020-02-04: qty 5

## 2020-02-04 MED ORDER — ONDANSETRON HCL 4 MG/2ML IJ SOLN
INTRAMUSCULAR | Status: DC | PRN
  Administered 2020-02-04: 4 mg via INTRAVENOUS

## 2020-02-04 MED ORDER — SODIUM CHLORIDE 0.9 % IV SOLN
INTRAVENOUS | Status: DC | PRN
  Administered 2020-02-04: 500 mL

## 2020-02-04 MED ORDER — ASPIRIN EC 81 MG PO TBEC
81.0000 mg | DELAYED_RELEASE_TABLET | Freq: Every day | ORAL | Status: DC
  Administered 2020-02-05 – 2020-02-07 (×3): 81 mg via ORAL
  Filled 2020-02-04 (×3): qty 1

## 2020-02-04 MED ORDER — HYDRALAZINE HCL 20 MG/ML IJ SOLN: 5.0000 mg | INTRAMUSCULAR | Status: DC | PRN

## 2020-02-04 MED ORDER — METHOCARBAMOL 500 MG PO TABS
750.0000 mg | ORAL_TABLET | Freq: Three times a day (TID) | ORAL | Status: DC
  Administered 2020-02-04 – 2020-02-07 (×8): 750 mg via ORAL
  Filled 2020-02-04 (×8): qty 2

## 2020-02-04 MED ORDER — OXYCODONE-ACETAMINOPHEN 5-325 MG PO TABS
1.0000 | ORAL_TABLET | ORAL | Status: DC | PRN
  Administered 2020-02-04 – 2020-02-07 (×13): 2 via ORAL
  Filled 2020-02-04 (×14): qty 2

## 2020-02-04 MED ORDER — ROCURONIUM BROMIDE 10 MG/ML (PF) SYRINGE
PREFILLED_SYRINGE | INTRAVENOUS | Status: DC | PRN
  Administered 2020-02-04: 30 mg via INTRAVENOUS
  Administered 2020-02-04: 70 mg via INTRAVENOUS
  Administered 2020-02-04 (×3): 20 mg via INTRAVENOUS
  Administered 2020-02-04: 30 mg via INTRAVENOUS

## 2020-02-04 MED ORDER — LISINOPRIL 10 MG PO TABS
10.0000 mg | ORAL_TABLET | Freq: Every day | ORAL | Status: DC
  Administered 2020-02-06 – 2020-02-07 (×2): 10 mg via ORAL
  Filled 2020-02-04 (×2): qty 1

## 2020-02-04 MED ORDER — ALBUTEROL SULFATE HFA 108 (90 BASE) MCG/ACT IN AERS
1.0000 | INHALATION_SPRAY | Freq: Four times a day (QID) | RESPIRATORY_TRACT | Status: DC | PRN
Start: 2020-02-04 — End: 2020-02-07
  Filled 2020-02-04: qty 6.7

## 2020-02-04 MED ORDER — DOCUSATE SODIUM 100 MG PO CAPS
100.0000 mg | ORAL_CAPSULE | Freq: Every day | ORAL | Status: DC
  Administered 2020-02-05 – 2020-02-07 (×3): 100 mg via ORAL
  Filled 2020-02-04 (×3): qty 1

## 2020-02-04 MED ORDER — 0.9 % SODIUM CHLORIDE (POUR BTL) OPTIME
TOPICAL | Status: DC | PRN
  Administered 2020-02-04: 2000 mL

## 2020-02-04 MED ORDER — CHLORHEXIDINE GLUCONATE CLOTH 2 % EX PADS: 6.0000 | MEDICATED_PAD | Freq: Once | CUTANEOUS | Status: DC

## 2020-02-04 MED ORDER — PROPOFOL 10 MG/ML IV BOLUS
INTRAVENOUS | Status: DC | PRN
  Administered 2020-02-04: 200 mg via INTRAVENOUS

## 2020-02-04 MED ORDER — PHENYLEPHRINE 40 MCG/ML (10ML) SYRINGE FOR IV PUSH (FOR BLOOD PRESSURE SUPPORT)
PREFILLED_SYRINGE | INTRAVENOUS | Status: AC
  Filled 2020-02-04: qty 10

## 2020-02-04 MED ORDER — SODIUM CHLORIDE 0.9 % IV SOLN: INTRAVENOUS | Status: DC

## 2020-02-04 MED ORDER — PRAVASTATIN SODIUM 10 MG PO TABS
20.0000 mg | ORAL_TABLET | Freq: Every day | ORAL | Status: DC
  Administered 2020-02-05: 20 mg via ORAL
  Filled 2020-02-04: qty 2

## 2020-02-04 MED ORDER — LIDOCAINE 2% (20 MG/ML) 5 ML SYRINGE
INTRAMUSCULAR | Status: AC
  Filled 2020-02-04: qty 5

## 2020-02-04 MED ORDER — PHENYLEPHRINE 40 MCG/ML (10ML) SYRINGE FOR IV PUSH (FOR BLOOD PRESSURE SUPPORT)
PREFILLED_SYRINGE | INTRAVENOUS | Status: DC | PRN
  Administered 2020-02-04 (×5): 80 ug via INTRAVENOUS

## 2020-02-04 MED ORDER — ONDANSETRON HCL 4 MG/2ML IJ SOLN
4.0000 mg | Freq: Once | INTRAMUSCULAR | Status: AC | PRN
  Administered 2020-02-04: 4 mg via INTRAVENOUS

## 2020-02-04 MED ORDER — PANTOPRAZOLE SODIUM 40 MG PO TBEC
40.0000 mg | DELAYED_RELEASE_TABLET | Freq: Every day | ORAL | Status: DC
  Administered 2020-02-04 – 2020-02-07 (×4): 40 mg via ORAL
  Filled 2020-02-04 (×4): qty 1

## 2020-02-04 MED ORDER — PHENYLEPHRINE HCL-NACL 10-0.9 MG/250ML-% IV SOLN
INTRAVENOUS | Status: DC | PRN
Start: 2020-02-04 — End: 2020-02-04
  Administered 2020-02-04: 40 ug/min via INTRAVENOUS

## 2020-02-04 MED ORDER — MORPHINE SULFATE (PF) 2 MG/ML IV SOLN
2.0000 mg | INTRAVENOUS | Status: DC | PRN
  Administered 2020-02-04 – 2020-02-06 (×10): 2 mg via INTRAVENOUS
  Filled 2020-02-04 (×10): qty 1

## 2020-02-04 MED ORDER — ACETAMINOPHEN 325 MG PO TABS
325.0000 mg | ORAL_TABLET | ORAL | Status: DC | PRN
  Administered 2020-02-06: 650 mg via ORAL
  Filled 2020-02-04: qty 2

## 2020-02-04 MED ORDER — ROCURONIUM BROMIDE 10 MG/ML (PF) SYRINGE
PREFILLED_SYRINGE | INTRAVENOUS | Status: AC
  Filled 2020-02-04: qty 20

## 2020-02-04 MED ORDER — PHENOL 1.4 % MT LIQD: 1.0000 | OROMUCOSAL | Status: DC | PRN

## 2020-02-04 MED ORDER — MIDAZOLAM HCL 5 MG/5ML IJ SOLN
INTRAMUSCULAR | Status: DC | PRN
  Administered 2020-02-04 (×2): 1 mg via INTRAVENOUS

## 2020-02-04 MED ORDER — LACTATED RINGERS IV SOLN: INTRAVENOUS | Status: DC | PRN

## 2020-02-04 MED ORDER — MIDAZOLAM HCL 2 MG/2ML IJ SOLN
INTRAMUSCULAR | Status: AC
  Filled 2020-02-04: qty 2

## 2020-02-04 MED ORDER — PROTAMINE SULFATE 10 MG/ML IV SOLN
INTRAVENOUS | Status: DC | PRN
  Administered 2020-02-04: 50 mg via INTRAVENOUS

## 2020-02-04 MED ORDER — CLONAZEPAM 0.5 MG PO TABS
0.5000 mg | ORAL_TABLET | Freq: Two times a day (BID) | ORAL | Status: DC
  Administered 2020-02-04 – 2020-02-07 (×6): 0.5 mg via ORAL
  Filled 2020-02-04 (×6): qty 1

## 2020-02-04 MED ORDER — FENTANYL CITRATE (PF) 100 MCG/2ML IJ SOLN
INTRAMUSCULAR | Status: AC
  Filled 2020-02-04: qty 2

## 2020-02-04 MED ORDER — LIDOCAINE 2% (20 MG/ML) 5 ML SYRINGE
INTRAMUSCULAR | Status: DC | PRN
  Administered 2020-02-04: 80 mg via INTRAVENOUS

## 2020-02-04 MED ORDER — LACTATED RINGERS IV SOLN: INTRAVENOUS | Status: DC

## 2020-02-04 MED ORDER — FENTANYL CITRATE (PF) 100 MCG/2ML IJ SOLN
25.0000 ug | INTRAMUSCULAR | Status: DC | PRN
Start: 2020-02-04 — End: 2020-02-04
  Administered 2020-02-04: 25 ug via INTRAVENOUS

## 2020-02-04 MED ORDER — ACETAMINOPHEN 650 MG RE SUPP: 325.0000 mg | RECTAL | Status: DC | PRN

## 2020-02-04 MED ORDER — MAGNESIUM SULFATE 2 GM/50ML IV SOLN: 2.0000 g | Freq: Every day | INTRAVENOUS | Status: DC | PRN

## 2020-02-04 MED ORDER — ACETAMINOPHEN 10 MG/ML IV SOLN
1000.0000 mg | Freq: Once | INTRAVENOUS | Status: DC | PRN
  Administered 2020-02-04: 1000 mg via INTRAVENOUS

## 2020-02-04 MED ORDER — CHLORHEXIDINE GLUCONATE CLOTH 2 % EX PADS
6.0000 | MEDICATED_PAD | Freq: Every day | CUTANEOUS | Status: DC
  Administered 2020-02-05 – 2020-02-06 (×2): 6 via TOPICAL

## 2020-02-04 MED ORDER — SODIUM CHLORIDE 0.9 % IV SOLN
INTRAVENOUS | Status: AC
  Filled 2020-02-04: qty 1.2

## 2020-02-04 MED ORDER — ORAL CARE MOUTH RINSE: 15.0000 mL | Freq: Once | OROMUCOSAL | Status: AC

## 2020-02-04 MED ORDER — FENTANYL CITRATE (PF) 100 MCG/2ML IJ SOLN
INTRAMUSCULAR | Status: DC | PRN
  Administered 2020-02-04: 25 ug via INTRAVENOUS
  Administered 2020-02-04: 50 ug via INTRAVENOUS
  Administered 2020-02-04 (×3): 25 ug via INTRAVENOUS
  Administered 2020-02-04: 50 ug via INTRAVENOUS

## 2020-02-04 MED ORDER — CHLORHEXIDINE GLUCONATE 0.12 % MT SOLN
OROMUCOSAL | Status: AC
  Administered 2020-02-04: 15 mL
  Filled 2020-02-04: qty 15

## 2020-02-04 MED ORDER — ONDANSETRON HCL 4 MG/2ML IJ SOLN
INTRAMUSCULAR | Status: AC
  Filled 2020-02-04: qty 2

## 2020-02-04 MED ORDER — GUAIFENESIN-DM 100-10 MG/5ML PO SYRP: 15.0000 mL | ORAL_SOLUTION | ORAL | Status: DC | PRN

## 2020-02-04 MED ORDER — HEPARIN SODIUM (PORCINE) 1000 UNIT/ML IJ SOLN
INTRAMUSCULAR | Status: AC
  Filled 2020-02-04: qty 2

## 2020-02-04 MED ORDER — VECURONIUM BROMIDE 10 MG IV SOLR
INTRAVENOUS | Status: DC | PRN
  Administered 2020-02-04: 2 mg via INTRAVENOUS

## 2020-02-04 MED ORDER — SUGAMMADEX SODIUM 500 MG/5ML IV SOLN
INTRAVENOUS | Status: DC | PRN
  Administered 2020-02-04: 250 mg via INTRAVENOUS

## 2020-02-04 MED ORDER — ALBUMIN HUMAN 5 % IV SOLN: INTRAVENOUS | Status: DC | PRN

## 2020-02-04 MED ORDER — CYCLOBENZAPRINE HCL 10 MG PO TABS
10.0000 mg | ORAL_TABLET | Freq: Three times a day (TID) | ORAL | Status: DC
  Administered 2020-02-04 – 2020-02-07 (×8): 10 mg via ORAL
  Filled 2020-02-04 (×8): qty 1

## 2020-02-04 MED ORDER — CHLORHEXIDINE GLUCONATE 0.12 % MT SOLN: 15.0000 mL | Freq: Once | OROMUCOSAL | Status: AC

## 2020-02-04 MED ORDER — PROTAMINE SULFATE 10 MG/ML IV SOLN
INTRAVENOUS | Status: AC
  Filled 2020-02-04: qty 5

## 2020-02-04 MED ORDER — METOPROLOL TARTRATE 5 MG/5ML IV SOLN: 2.0000 mg | INTRAVENOUS | Status: DC | PRN

## 2020-02-04 MED ORDER — ALUM & MAG HYDROXIDE-SIMETH 200-200-20 MG/5ML PO SUSP: 15.0000 mL | ORAL | Status: DC | PRN

## 2020-02-04 MED ORDER — LABETALOL HCL 5 MG/ML IV SOLN: 10.0000 mg | INTRAVENOUS | Status: DC | PRN

## 2020-02-04 MED ORDER — HEPARIN SODIUM (PORCINE) 1000 UNIT/ML IJ SOLN
INTRAMUSCULAR | Status: DC | PRN
  Administered 2020-02-04: 11000 [IU] via INTRAVENOUS
  Administered 2020-02-04 (×2): 2000 [IU] via INTRAVENOUS

## 2020-02-04 MED ORDER — SODIUM CHLORIDE 0.9 % IV SOLN: 500.0000 mL | Freq: Once | INTRAVENOUS | Status: DC | PRN

## 2020-02-04 MED ORDER — PROPOFOL 10 MG/ML IV BOLUS
INTRAVENOUS | Status: AC
  Filled 2020-02-04: qty 40

## 2020-02-04 MED ORDER — ACETAMINOPHEN 10 MG/ML IV SOLN
INTRAVENOUS | Status: AC
  Filled 2020-02-04: qty 100

## 2020-02-04 MED ORDER — HEPARIN SODIUM (PORCINE) 5000 UNIT/ML IJ SOLN
5000.0000 [IU] | Freq: Three times a day (TID) | INTRAMUSCULAR | Status: DC
  Administered 2020-02-05 – 2020-02-07 (×7): 5000 [IU] via SUBCUTANEOUS
  Filled 2020-02-04 (×7): qty 1

## 2020-02-04 SURGICAL SUPPLY — 72 items
ADH SKN CLS APL DERMABOND .7 (GAUZE/BANDAGES/DRESSINGS) ×4
AGENT HMST SPONGE THK3/8 (HEMOSTASIS)
BANDAGE ESMARK 6X9 LF (GAUZE/BANDAGES/DRESSINGS) IMPLANT
BLADE CLIPPER SURG (BLADE) ×3 IMPLANT
BNDG CMPR 9X6 STRL LF SNTH (GAUZE/BANDAGES/DRESSINGS)
BNDG ESMARK 6X9 LF (GAUZE/BANDAGES/DRESSINGS)
CANISTER SUCT 3000ML PPV (MISCELLANEOUS) ×3 IMPLANT
CANNULA VESSEL 3MM 2 BLNT TIP (CANNULA) ×1 IMPLANT
CLIP FOGARTY SPRING 6M (CLIP) ×3 IMPLANT
CLIP VESOCCLUDE MED 24/CT (CLIP) ×3 IMPLANT
CLIP VESOCCLUDE SM WIDE 24/CT (CLIP) ×3 IMPLANT
COVER PROBE W GEL 5X96 (DRAPES) ×3 IMPLANT
COVER SURGICAL LIGHT HANDLE (MISCELLANEOUS) ×1 IMPLANT
COVER WAND RF STERILE (DRAPES) ×2 IMPLANT
CUFF TOURN SGL QUICK 24 (TOURNIQUET CUFF)
CUFF TOURN SGL QUICK 34 (TOURNIQUET CUFF)
CUFF TOURN SGL QUICK 42 (TOURNIQUET CUFF) IMPLANT
CUFF TRNQT CYL 24X4X16.5-23 (TOURNIQUET CUFF) IMPLANT
CUFF TRNQT CYL 34X4.125X (TOURNIQUET CUFF) IMPLANT
DERMABOND ADVANCED (GAUZE/BANDAGES/DRESSINGS) ×2
DERMABOND ADVANCED .7 DNX12 (GAUZE/BANDAGES/DRESSINGS) ×2 IMPLANT
DRAIN CHANNEL 15F RND FF W/TCR (WOUND CARE) IMPLANT
DRAPE C-ARM 42X72 X-RAY (DRAPES) ×2 IMPLANT
DRAPE HALF SHEET 40X57 (DRAPES) ×1 IMPLANT
ELECT REM PT RETURN 9FT ADLT (ELECTROSURGICAL) ×3
ELECTRODE REM PT RTRN 9FT ADLT (ELECTROSURGICAL) ×2 IMPLANT
EVACUATOR SILICONE 100CC (DRAIN) IMPLANT
GLOVE BIO SURGEON STRL SZ7.5 (GLOVE) ×5 IMPLANT
GLOVE SRG 8 PF TXTR STRL LF DI (GLOVE) ×2 IMPLANT
GLOVE SURG SS PI 6.0 STRL IVOR (GLOVE) ×2 IMPLANT
GLOVE SURG UNDER POLY LF SZ6.5 (GLOVE) ×1 IMPLANT
GLOVE SURG UNDER POLY LF SZ8 (GLOVE) ×3
GOWN STRL REUS W/ TWL LRG LVL3 (GOWN DISPOSABLE) ×6 IMPLANT
GOWN STRL REUS W/ TWL XL LVL3 (GOWN DISPOSABLE) ×4 IMPLANT
GOWN STRL REUS W/TWL LRG LVL3 (GOWN DISPOSABLE) ×9
GOWN STRL REUS W/TWL XL LVL3 (GOWN DISPOSABLE) ×6
HEMOSTAT SPONGE AVITENE ULTRA (HEMOSTASIS) IMPLANT
INSERT FOGARTY SM (MISCELLANEOUS) ×1 IMPLANT
KIT BASIN OR (CUSTOM PROCEDURE TRAY) ×3 IMPLANT
KIT TURNOVER KIT B (KITS) ×3 IMPLANT
LOOP VESSEL MINI RED (MISCELLANEOUS) ×1 IMPLANT
MARKER SKIN DUAL TIP RULER LAB (MISCELLANEOUS) ×2 IMPLANT
NS IRRIG 1000ML POUR BTL (IV SOLUTION) ×6 IMPLANT
PACK PERIPHERAL VASCULAR (CUSTOM PROCEDURE TRAY) ×3 IMPLANT
PAD ARMBOARD 7.5X6 YLW CONV (MISCELLANEOUS) ×6 IMPLANT
PATCH VASC XENOSURE 1CMX6CM (Vascular Products) ×3 IMPLANT
PATCH VASC XENOSURE 1X6 (Vascular Products) IMPLANT
SET MICROPUNCTURE 5F STIFF (MISCELLANEOUS) IMPLANT
STOPCOCK 4 WAY LG BORE MALE ST (IV SETS) IMPLANT
SUT ETHILON 3 0 PS 1 (SUTURE) IMPLANT
SUT GORETEX 5 0 TT13 24 (SUTURE) IMPLANT
SUT GORETEX 6.0 TT13 (SUTURE) IMPLANT
SUT MNCRL AB 4-0 PS2 18 (SUTURE) ×10 IMPLANT
SUT PROLENE 5 0 C 1 24 (SUTURE) ×10 IMPLANT
SUT PROLENE 5 0 C 1 36 (SUTURE) ×3 IMPLANT
SUT PROLENE 6 0 BV (SUTURE) ×9 IMPLANT
SUT PROLENE 7 0 BV 1 (SUTURE) IMPLANT
SUT SILK 2 0 PERMA HAND 18 BK (SUTURE) ×1 IMPLANT
SUT SILK 2 0 SH (SUTURE) ×1 IMPLANT
SUT SILK 3 0 (SUTURE) ×12
SUT SILK 3-0 18XBRD TIE 12 (SUTURE) IMPLANT
SUT VIC AB 2-0 CT1 27 (SUTURE) ×12
SUT VIC AB 2-0 CT1 TAPERPNT 27 (SUTURE) ×4 IMPLANT
SUT VIC AB 3-0 SH 27 (SUTURE) ×15
SUT VIC AB 3-0 SH 27X BRD (SUTURE) ×6 IMPLANT
SYR BULB IRRIG 60ML STRL (SYRINGE) ×1 IMPLANT
TAPE UMBILICAL COTTON 1/8X30 (MISCELLANEOUS) IMPLANT
TOWEL GREEN STERILE (TOWEL DISPOSABLE) ×3 IMPLANT
TRAY FOLEY MTR SLVR 16FR STAT (SET/KITS/TRAYS/PACK) ×3 IMPLANT
TUBING EXTENTION W/L.L. (IV SETS) IMPLANT
UNDERPAD 30X36 HEAVY ABSORB (UNDERPADS AND DIAPERS) ×3 IMPLANT
WATER STERILE IRR 1000ML POUR (IV SOLUTION) ×3 IMPLANT

## 2020-02-04 NOTE — Transfer of Care (Signed)
Immediate Anesthesia Transfer of Care Note  Patient: Wayne Pratt  Procedure(s) Performed: RIGHT FEMORAL ENDARTERECTOMY with Profundaplasty (Right Groin) BYPASS GRAFT FEMORAL-POPLITEAL ARTERY With Vein Harvest (Right Leg Upper) VEIN HARVEST Greater Saphenous Vein (Right Leg Upper) PATCH ANGIOPLASTY (Right Leg Upper)  Patient Location: PACU  Anesthesia Type:General  Level of Consciousness: drowsy  Airway & Oxygen Therapy: Patient Spontanous Breathing and Patient connected to face mask oxygen  Post-op Assessment: Report given to RN and Post -op Vital signs reviewed and stable  Post vital signs: Reviewed and stable  Last Vitals:  Vitals Value Taken Time  BP    Temp    Pulse 106 02/04/20 1455  Resp 22 02/04/20 1455  SpO2 100 % 02/04/20 1455  Vitals shown include unvalidated device data.  Last Pain:  Vitals:   02/04/20 0756  PainSc: 10-Worst pain ever      Patients Stated Pain Goal: 3 (88/82/80 0349)  Complications: No complications documented.

## 2020-02-04 NOTE — Anesthesia Procedure Notes (Signed)
Arterial Line Insertion Start/End1/24/2022 8:55 AM, 02/04/2020 9:00 AM Performed by: Colin Benton, CRNA, CRNA  Patient location: Pre-op. Preanesthetic checklist: patient identified, IV checked, site marked, risks and benefits discussed, surgical consent, monitors and equipment checked, pre-op evaluation, timeout performed and anesthesia consent Lidocaine 1% used for infiltration Right, radial was placed Catheter size: 20 G Hand hygiene performed , maximum sterile barriers used  and Seldinger technique used Allen's test indicative of satisfactory collateral circulation Attempts: 1 Procedure performed without using ultrasound guided technique. Following insertion, Biopatch and dressing applied. Post procedure assessment: normal  Patient tolerated the procedure well with no immediate complications. Additional procedure comments: Performed by Heide Scales, SRNA under direct supervision by Eligha Bridegroom, CRNA.

## 2020-02-04 NOTE — Anesthesia Procedure Notes (Addendum)
Procedure Name: Intubation Date/Time: 02/04/2020 10:08 AM Performed by: Colin Benton, CRNA Pre-anesthesia Checklist: Patient identified, Emergency Drugs available, Suction available and Patient being monitored Patient Re-evaluated:Patient Re-evaluated prior to induction Oxygen Delivery Method: Circle System Utilized Preoxygenation: Pre-oxygenation with 100% oxygen Induction Type: IV induction Ventilation: Mask ventilation without difficulty Laryngoscope Size: Miller and 2 Grade View: Grade I Tube type: Oral Tube size: 7.5 mm Number of attempts: 1 Airway Equipment and Method: Stylet and Oral airway Placement Confirmation: ETT inserted through vocal cords under direct vision,  positive ETCO2 and breath sounds checked- equal and bilateral Secured at: 23 cm Tube secured with: Tape Dental Injury: Teeth and Oropharynx as per pre-operative assessment  Comments: Performed by Heide Scales, SRNA under direct supervision by Alden Benjamin, CRNA and Dr. Gloris Manchester

## 2020-02-04 NOTE — Progress Notes (Addendum)
Upon patients assessment upon arrival patient has what appears as a skin tear to the back of the right lower calf area. Area is red in color with bruising.  Patient was unaware of the skin tear on the back of his leg.  Stated "must of came from walking in woods and a briar got me"  Will notify Dr. Carlis Abbott of area since it is on operative leg for todays surgery  8:22 AM  Dr Carlis Abbott made aware of patients skin tear, plan is to proceed at this time

## 2020-02-04 NOTE — Op Note (Addendum)
Date: February 04, 2020  Preoperative diagnosis: Critical limb ischemia of the right lower extremity with severe rest pain  Postoperative diagnosis: Same  Procedure: 1.  Harvest of right great saphenous vein 2.  Right common femoral endarterectomy with profundoplasty and bovine pericardial patch angioplasty 3.  Right common femoral artery to below-knee popliteal artery bypass with ipsilateral nonreversed great saphenous vein  Surgeon: Dr. Marty Heck, MD  Assistant: Arlee Muslim, PA  Indication: Patient is a 60 year old male who was seen in the office last week with critical limb ischemia of the right lower extremity with severe rest pain and an ABI of 0.  CTA prior to referral showed an occluded right common femoral and profunda and distal runoff through the PT artery.  Ultimately I recommended arteriogram given I thought he would need bypass especially if I could not get the profunda open and he had an arteriogram last Thursday.  He presents today for planned right common femoral endarterectomy, profundoplasty, right common femoral to below-knee popliteal artery bypass after risk benefits discussed.  An assistant was needed for exposure and to expedite the case.  Findings: Right great saphenous vein was harvested from the saphenofemoral junction down to the mid calf and was of excellent caliber.  Ultimately common femoral artery was endarterectomized onto the profunda with backbleeding from the profunda and a bovine pericardial patch was sewn to the right common femoral artery onto the profunda.  A right common femoral to below-knee popliteal artery bypass with ipsilateral great saphenous vein was tunneled subfascial subsartorial in an anatomic position and non-reversed.  He had excellent PT signal at completion.  Anesthesia: General  Details: Patient was taken to the operating room after informed consent was obtained.  Placed on the operative table in supine position.  General  endotracheal anesthesia was induced.  Initially marked out the saphenous vein in the right leg from the inguinal crease down to the calf with ultrasound.  The right groin and right leg were then prepped and draped in usual sterile fashion.  Timeout was performed to identify patient, procedure, and site.  He did get preoperative antibiotics.  Initially made a vertical groin incision in the right groin dissected down isolated the common femoral artery as well as the SFA and the profunda.  The profunda vein was ligated in order to get more mobilization on the profunda femoris artery given the thought he would need a profundoplasty.  Ultimately I then identified the saphenous vein at the saphenofemoral junction this was dissected out in the vertical groin incision and I made three skip incisions all the way down the right leg on the medial side to the calf where the vein had been marked.  Ultimately the vein was circumferentially mobilized with Metzenbaum scissors and all side branches were ligated between 3-0 silk ties and divided.  Once we got enough vein mobilized, I then ligated it distally in the mid calf on a right angle clamp oversewed this with a 2-0 silk tie and then passed through the skin bridges up to the groin where then I oversewed the saphenofemoral junction with a 5-0 Prolene after the vein was transected.  The vein was then flushed and dilated nicely and one sidebranch was repaired with a 6-0 Prolene.  I went down and exposed the below-knee popliteal artery medially through our saphenectomy incision.  Ultimately Meyerding retractors were used for added visualization and the artery was dissected off the veins and Vesseloops were placed proximally distally and this appeared pretty nice for  sewing target. That point in time I then tunneled from the below-knee popliteal fossa up to the groin in subfascial subsartorial plane.  Patient was given 100 units per kg heparin IV.  ACT was checked to maintain  greater than 250.  I then used a vessel loop on the profunda and a Henley clamp proximally on the external iliac artery.  The common femoral was opened with 11 blade scalpel and I extended the arteriotomy onto the profunda.  The common femoral was completely occluded endarterectomy was performed with Wayna Chalet and I tried to do eversion endarterectomy of the profunda but I was not happy with the endpoint.  I then carried the arteriotomy further down the profunda finally I was able to get a nice endpoint and good backbleeding.  A bovine pericardial patch was brought on the field sewn to the common femoral artery onto the profunda with a 5-0 Prolene in parachute technique.  Once we came off clamps everything was de-aired prior to completion.  I did have to place several additional 5-0 Prolene patch sutures.  We then brought the vein on the field and I used a Cooley clamp to control the patch and a arteriotomy was made on the bovine patch in the right common femoral artery.  I then beveled the saphenous vein and it was sewn end to side to the right common femoral artery in nonreversed fashion.  We then used a valvulotome and lysed all the valves and the vein dilated nicely and had good pulsatile flow distally.  I then marked the vein for orientation and was passed through the previously created tunnel.  The leg was straight and the vein was cut the appropriate length.  I then used Vesseloops to control the below-knee popliteal artery and this artery was opened with 11 blade scalpel extended Potts scissors and the distal vein was spatulated and sewn end to side to the below-knee popliteal artery.  Ultimately we de-aired everything prior to completion.  We had excellent Doppler flow in the below-knee popliteal artery when the vein bypass was patent.  Also had excellent PT signal at the ankle.  50 mg protamine was given.  I irrigated out all the incisions.  The groin was closed in multiple use of 2-0 Vicryl, 3-0  Vicryl, 4-0 Monocryl, and Dermabond.  Below-knee popliteal exposure was closed in multiple layers of 2-0 Vicryl, 3-0 Vicryl, 4-0 Monocryl, and Dermabond.  All vein harvest incisions were closed with 3-0 Vicryl and 4-0 Monocryl.  Taken to recovery in stable condition.  Complication: None  Condition: Stable  Marty Heck, MD [facility] Office: [place]

## 2020-02-04 NOTE — H&P (Signed)
History and Physical Interval Note:  02/04/2020 9:17 AM  Wayne Pratt  has presented today for surgery, with the diagnosis of CRITICAL LOWER LIMB  ISCHEMIA.  The various methods of treatment have been discussed with the patient and family. After consideration of risks, benefits and other options for treatment, the patient has consented to  Procedure(s): RIGHT FEMORAL ENDARTERECTOMY (Right) BYPASS GRAFT FEMORAL-POPLITEAL ARTERY (Right) as a surgical intervention.  The patient's history has been reviewed, patient examined, no change in status, stable for surgery.  I have reviewed the patient's chart and labs.  Questions were answered to the patient's satisfaction.    CLI right leg with ABI 0.  Plan right femoral endarterectomy, profundoplasty, fem pop bypass.  Marty Heck  Patient name: Wayne Pratt   MRN: [id number]        DOB: 28-Mar-1960        Sex: male  REASON FOR CONSULT: Right lower extremity pain and evaluate for PAD  HPI: [name] is a 60 y.o. male, with history of hypertension, hyperlipidemia, DM, and tobacco abuse that presents for evaluation of right leg pain.  Patient states he noticed that his right leg was having a lot of cramping and discomfort that started back in deer season that really limited his activity.  Ultimately he was unable to deer hunt at that time.  He had progressive symptoms about 3 weeks ago where he had worsening pain in the leg to the point that it was bothering him all the time and he is now requiring narcotics.  He is having to dangle his foot off the side of the bed for relief.  Has had trouble wiggling toes for several weeks.  He is a smoker and smokes at least a pack a day.  He had a CTA on 01/23/2020 that showed occlusion of the right common femoral artery with occlusion of the profunda and also the proximal right SFA.  He does appear to have reconstitution of flow in the SFA with a small flow channel, the popliteal is open, and all three tibials are  open with disease.        Past Medical History:  Diagnosis Date  . Anemia    takes Iron pill daily  . Anxiety    takes Xanax daily  . Arthritis   . Diabetes mellitus ([place])    takes Metformin daily  . History of bronchitis    mid 90's  . History of hypertension    lost weight and been off meds over a yr  . History of kidney stones   . History of pleural empyema   . Hypercholesterolemia    lost weight and been off meds over a yr  . Hypertension   . Insomnia    takes Trazodone nightly  . Kidney stones    22 times  . Neuropathy   . Pneumonia    hx of-15+ yrs ago  . Sleep apnea    Stop Bang score of 7  . Weakness    numbness and tingling         Past Surgical History:  Procedure Laterality Date  . BIOPSY  10/05/2011   Dr. Oneida Alar :non-erosive gastritis  . BIOPSY  08/06/2014   Procedure: BIOPSY;  Surgeon: Danie Binder, MD;  Location: [facility];  Service: Endoscopy;;  . CERVICAL DISC SURGERY    . ELBOW SURGERY Right   . ESOPHAGOGASTRODUODENOSCOPY  Sept 2013   Dr. Oneida Alar: chronic inactive gastritis   . ESOPHAGOGASTRODUODENOSCOPY  N/A 08/18/2017   Procedure: ESOPHAGOGASTRODUODENOSCOPY (EGD);  Surgeon: Milus Banister, MD;  Location: [facility];  Service: Endoscopy;  Laterality: N/A;  . ESOPHAGOGASTRODUODENOSCOPY N/A 03/09/2018   Procedure: ESOPHAGOGASTRODUODENOSCOPY (EGD);  Surgeon: Milus Banister, MD;  Location: [facility];  Service: Endoscopy;  Laterality: N/A;  . ESOPHAGOGASTRODUODENOSCOPY (EGD) WITH PROPOFOL N/A 08/06/2014   mild non-erosive gastritis, duodenitis  . EUS N/A 08/18/2017   Procedure: UPPER ENDOSCOPIC ULTRASOUND (EUS) RADIAL;  Surgeon: Milus Banister, MD;  Location: [facility];  Service: Endoscopy;  Laterality: N/A;  . EUS N/A 03/09/2018   Procedure: UPPER ENDOSCOPIC ULTRASOUND (EUS) RADIAL;  Surgeon: Milus Banister, MD;  Location: [facility];  Service: Endoscopy;  Laterality: N/A;  . FINE NEEDLE  ASPIRATION  03/09/2018   Procedure: FINE NEEDLE ASPIRATION;  Surgeon: Milus Banister, MD;  Location: [facility];  Service: Endoscopy;;  . FLEXIBLE SIGMOIDOSCOPY  10/05/2011   Dr. Fields:poor prep  . LUNG SURGERY  2010   for empyema  . SPINAL CORD STIMULATOR INSERTION N/A 08/01/2015   Procedure: Spinal Cord Stimulator Implant;  Surgeon: Eustace Moore, MD;  Location: [facility];  Service: Neurosurgery;  Laterality: N/A;  . TONSILLECTOMY    . VASECTOMY Bilateral 1995         Family History  Problem Relation Age of Onset  . Prostate cancer Other        all male members on mother's side  . Heart attack Father   . COPD Maternal Grandmother   . Cancer Paternal Grandmother   . Colon cancer Neg Hx     SOCIAL HISTORY: Social History        Socioeconomic History  . Marital status: Married    Spouse name: Not on file  . Number of children: 2  . Years of education: Not on file  . Highest education level: Not on file  Occupational History  . Occupation: disabled    Comment: 2005  Tobacco Use  . Smoking status: Current Every Day Smoker    Packs/day: 1.00    Years: 39.00    Pack years: 39.00    Types: Cigarettes  . Smokeless tobacco: Never Used  Vaping Use  . Vaping Use: Never used  Substance and Sexual Activity  . Alcohol use: No    Alcohol/week: 0.0 standard drinks  . Drug use: No  . Sexual activity: Yes    Birth control/protection: None  Other Topics Concern  . Not on file  Social History Narrative  . Not on file   Social Determinants of Health   Financial Resource Strain: Not on file  Food Insecurity: Not on file  Transportation Needs: Not on file  Physical Activity: Not on file  Stress: Not on file  Social Connections: Not on file  Intimate Partner Violence: Not on file         Allergies  Allergen Reactions  . Penicillins Anaphylaxis and Other (See Comments)    Has patient had a PCN reaction causing immediate  rash, facial/tongue/throat swelling, SOB or lightheadedness with hypotension: Yes Has patient had a PCN reaction causing severe rash involving mucus membranes or skin necrosis: Yes Has patient had a PCN reaction that required hospitalization: No Has patient had a PCN reaction occurring within the last 10 years: No If all of the above answers are "NO", then may proceed with Cephalosporin use.   . Ativan [Lorazepam] Other (See Comments)    Violent and Mean           Current  Outpatient Medications  Medication Sig Dispense Refill  . albuterol (VENTOLIN HFA) 108 (90 Base) MCG/ACT inhaler Inhale 1-2 puffs into the lungs every 6 (six) hours as needed for wheezing or shortness of breath. 18 g 0  . clonazePAM (KLONOPIN) 0.5 MG tablet Take 0.5 mg by mouth 2 (two) times daily.     . cyclobenzaprine (FLEXERIL) 10 MG tablet Take 10 mg by mouth 3 (three) times daily as needed for muscle spasms.     [name] Kitchen GLIPIZIDE XL 10 MG 24 hr tablet Take 10 mg by mouth 2 (two) times daily.    . IBU 800 MG tablet Take 800 mg by mouth daily as needed for headache.     . lisinopril (ZESTRIL) 10 MG tablet TAKE 1 TABLET BY MOUTH ONCE A DAY. 90 tablet 0  . lovastatin (MEVACOR) 40 MG tablet Take 40 mg by mouth at bedtime.     . metFORMIN (GLUCOPHAGE) 1000 MG tablet Take 1,000 mg by mouth 2 (two) times daily.     . metoprolol tartrate (LOPRESSOR) 100 MG tablet TAKE 1 TABLET 2 HR PRIOR TO CARDIAC PROCEDURE 1 tablet 0  . naproxen sodium (ALEVE) 220 MG tablet Take 440 mg by mouth daily as needed (for pain or headache).    . Oxycodone HCl 10 MG TABS Take 10 mg by mouth every 4 (four) hours.     . pioglitazone (ACTOS) 15 MG tablet Take 15 mg by mouth daily.    [name] Kitchen azithromycin (ZITHROMAX) 250 MG tablet Take 1 tablet (250 mg total) by mouth daily. Take first 2 tablets together, then 1 every day until finished. 6 tablet 0  . benzonatate (TESSALON) 100 MG capsule Take 1 capsule (100 mg total) by mouth every 8 (eight)  hours. 60 capsule 0  . HYDROmorphone (DILAUDID) 4 MG tablet Take 1 tablet (4 mg total) by mouth every 6 (six) hours as needed for severe pain. 20 tablet 0  . morphine (MSIR) 15 MG tablet Take 1 tablet by mouth every 4 (four) hours as needed for pain.    [name] Kitchen omeprazole (PRILOSEC) 20 MG capsule TAKE 1 CAPSULE ONCE DAILY 30 MINUTES BEFORE YOUR FIRST MEAL. (Patient not taking: Reported on 01/29/2020) 30 capsule 5  . ondansetron (ZOFRAN ODT) 4 MG disintegrating tablet 4mg  ODT q4 hours prn nausea/vomit 12 tablet 0  . ondansetron (ZOFRAN) 8 MG tablet Take 1 tablet (8 mg total) by mouth every 4 (four) hours as needed. 10 tablet 0  . oxyCODONE-acetaminophen (PERCOCET) 5-325 MG tablet Take 1 tablet by mouth every 4 (four) hours as needed. 15 tablet 0  . pantoprazole (PROTONIX) 40 MG tablet TAKE 1 TABLET BY MOUTH 30 MINUTES PRIOR TO BREAKFAST. (Patient taking differently: Take 40 mg by mouth daily. ) 90 tablet 3  . predniSONE (DELTASONE) 10 MG tablet Take 2 tablets (20 mg total) by mouth daily. 15 tablet 0   No current facility-administered medications for this visit.    REVIEW OF SYSTEMS:  [X]  denotes positive finding, [ ]  denotes negative finding Cardiac  Comments:  Chest pain or chest pressure:    Shortness of breath upon exertion:    Short of breath when lying flat:    Irregular heart rhythm:        Vascular    Pain in calf, thigh, or hip brought on by ambulation: x   Pain in feet at night that wakes you up from your sleep:  x   Blood clot in your veins:    Leg swelling:  Pulmonary    Oxygen at home:    Productive cough:     Wheezing:         Neurologic    Sudden weakness in arms or legs:     Sudden numbness in arms or legs:     Sudden onset of difficulty speaking or slurred speech:    Temporary loss of vision in one eye:     Problems with dizziness:         Gastrointestinal    Blood in stool:     Vomited blood:          Genitourinary    Burning when urinating:     Blood in urine:        Psychiatric    Major depression:         Hematologic    Bleeding problems:    Problems with blood clotting too easily:        Skin    Rashes or ulcers:        Constitutional    Fever or chills:      PHYSICAL EXAM:    Vitals:   01/29/20 1340  BP: 122/77  Pulse: 87  Resp: 18  Temp: 98.3 F (36.8 C)  TempSrc: Temporal  SpO2: 97%  Weight: 234 lb (106.1 kg)  Height: 6\' 1"  (1.854 m)    GENERAL: The patient is a well-nourished male, in no acute distress. The vital signs are documented above. CARDIAC: There is a regular rate and rhythm.  VASCULAR:  Left femoral pulse 2+ palpable Right femoral pulse 1+ palpable Right foot with monophasic PT signal Difficulty wiggling toes Left DP palpable PULMONARY: No respiratory distress. ABDOMEN: Soft and non-tender. MUSCULOSKELETAL: There are no major deformities or cyanosis. NEUROLOGIC: No focal weakness or paresthesias are detected. SKIN: There are no ulcers or rashes noted. PSYCHIATRIC: The patient has a normal affect.  DATA:   ABIs at the right ankle were zero in the office  I independently reviewed CTA abdomen pelvis with runoff from 01/23/2020 and distal right common femoral artery is occluded and the proximal profunda is occluded with distal reconstitution and the proximal SFA is occluded but does have a flow channel with a patent popliteal and diseased three-vessel runoff.  Assessment/Plan:  60 year old male that presents with critical limb ischemia of the right lower extremity with severe rest pain.  I reviewed his CTA abdomen pelvis with runoff and appears he has an occluded common femoral and also an occluded profunda with an occluded proximal SFA.  He does reconstitute what appears to be a flow channel in the SFA and has diseased three-vessel runoff.  I think he ultimately is going to require right  common femoral endarterectomy with profundoplasty and a possible femoropopliteal bypass.  I have recommended arteriogram on Thursday to evaluate for bypass target given that he was having disabling claudication in his right leg during hunting season likely before his common femoral occluded.  We will get him vein mapped in the office today.  I discussed after his arteriogram on Thursday I will have him set up for femoral endarterectomy, profundoplasty, and possible femoropopliteal bypass on Monday.  He understands he is at high risk for limb loss.     Marty Heck, MD [facility] Office: [phone]

## 2020-02-05 ENCOUNTER — Encounter (HOSPITAL_COMMUNITY): Payer: Self-pay | Admitting: Vascular Surgery

## 2020-02-05 LAB — CBC
HCT: 30.5 % — ABNORMAL LOW (ref 39.0–52.0)
Hemoglobin: 10.4 g/dL — ABNORMAL LOW (ref 13.0–17.0)
MCH: 31 pg (ref 26.0–34.0)
MCHC: 34.1 g/dL (ref 30.0–36.0)
MCV: 91 fL (ref 80.0–100.0)
Platelets: 403 10*3/uL — ABNORMAL HIGH (ref 150–400)
RBC: 3.35 MIL/uL — ABNORMAL LOW (ref 4.22–5.81)
RDW: 12.8 % (ref 11.5–15.5)
WBC: 15.6 10*3/uL — ABNORMAL HIGH (ref 4.0–10.5)
nRBC: 0 % (ref 0.0–0.2)

## 2020-02-05 LAB — BASIC METABOLIC PANEL
Anion gap: 11 (ref 5–15)
BUN: 28 mg/dL — ABNORMAL HIGH (ref 6–20)
CO2: 20 mmol/L — ABNORMAL LOW (ref 22–32)
Calcium: 8.8 mg/dL — ABNORMAL LOW (ref 8.9–10.3)
Chloride: 103 mmol/L (ref 98–111)
Creatinine, Ser: 1.13 mg/dL (ref 0.61–1.24)
GFR, Estimated: >60 mL/min (ref >60)
Glucose, Bld: 176 mg/dL — ABNORMAL HIGH (ref 70–99)
Potassium: 4.3 mmol/L (ref 3.5–5.1)
Sodium: 134 mmol/L — ABNORMAL LOW (ref 135–145)

## 2020-02-05 LAB — LIPID PANEL
Cholesterol: 133 mg/dL (ref 0–200)
HDL: 37 mg/dL — ABNORMAL LOW (ref >40)
LDL Cholesterol: 74 mg/dL (ref 0–99)
Total CHOL/HDL Ratio: 3.6 RATIO
Triglycerides: 112 mg/dL (ref <150)
VLDL: 22 mg/dL (ref 0–40)

## 2020-02-05 MED ORDER — COLLAGENASE 250 UNIT/GM EX OINT
TOPICAL_OINTMENT | Freq: Every day | CUTANEOUS | Status: DC
  Administered 2020-02-07: 1 via TOPICAL
  Filled 2020-02-05: qty 30

## 2020-02-05 MED ORDER — MELATONIN 5 MG PO TABS
10.0000 mg | ORAL_TABLET | Freq: Every day | ORAL | Status: DC
  Administered 2020-02-05 – 2020-02-06 (×2): 10 mg via ORAL
  Filled 2020-02-05 (×2): qty 2

## 2020-02-05 NOTE — Progress Notes (Addendum)
Redness identified on back of right calf. Not blanchable. Wound consult ordered. Insight appreciated.   Daymon Larsen, RN

## 2020-02-05 NOTE — Evaluation (Signed)
Occupational Therapy Evaluation Patient Details Name: Wayne Pratt MRN: [id number] DOB: [birth year] Today's Date: 02/05/2020    History of Present Illness KYDEN POTASH is a 60 y.o. male, with history of hypertension, hyperlipidemia, DM, cx surgery, spinal cord stimlulator, and tobacco abuse with RLE pain due to PAD that was limiting his function. Underwent R femoral endarterecomy, R FPBG 02/04/20.   Clinical Impression   Pt PTA:  Pt reports independence prior with ADL and mobility. Pt currently limited by decreased strength, increased pain, and decreased ability to care for self. Pt drowsy and reports stiffness in RLE. Pt was up in recliner for >3 hours and needed to change positions so modA transfer from recliner to bed taking only a few steps with RW and putting minimal wt on RLE due to pain.Pt set-upA to modA for ADL. Pt would benefit from continued OT skilled services. OT following acutely.    Follow Up Recommendations  Home health OT;Supervision/Assistance - 24 hour;SNF (if pt does not progress SNF might be required)    Equipment Recommendations  3 in 1 bedside commode    Recommendations for Other Services       Precautions / Restrictions Precautions Precautions: Fall Restrictions Weight Bearing Restrictions: No      Mobility Bed Mobility Overal bed mobility: Needs Assistance Bed Mobility: Sit to Supine Rolling: Mod assist     Sit to supine: Mod assist   General bed mobility comments: ModA for maneuvering RLE onto bed    Transfers Overall transfer level: Needs assistance Equipment used: Rolling walker (2 wheeled) Transfers: Sit to/from [facility] Sit to Stand: Min assist Stand pivot transfers: Mod assist       General transfer comment: sit to stand x1 from recliner and hopping on LLE to take steps to bed.    Balance Overall balance assessment: Needs assistance Sitting-balance support: Bilateral upper extremity supported;Feet supported Sitting  balance-Leahy Scale: Good     Standing balance support: Bilateral upper extremity supported Standing balance-Leahy Scale: Poor Standing balance comment: reliant heavily on RW                           ADL either performed or assessed with clinical judgement   ADL Overall ADL's : Needs assistance/impaired Eating/Feeding: Modified independent;Sitting   Grooming: Set up;Sitting   Upper Body Bathing: Set up;Sitting   Lower Body Bathing: Moderate assistance;Sitting/lateral leans;Sit to/from stand   Upper Body Dressing : Set up;Sitting   Lower Body Dressing: Moderate assistance;Sitting/lateral leans;Sit to/from stand;Cueing for safety   Toilet Transfer: Moderate assistance;Stand-pivot;Ambulation;BSC;RW Toilet Transfer Details (indicate cue type and reason): Pt simulating from recliner to bed with RW Toileting- Clothing Manipulation and Hygiene: Moderate assistance;+2 for physical assistance;+2 for safety/equipment;Sitting/lateral lean;Sit to/from stand Toileting - Clothing Manipulation Details (indicate cue type and reason): Pt with foley and requiring assist for stability in standing     Functional mobility during ADLs: Moderate assistance General ADL Comments: pt limited by decreased strength, increased pain, and decreased ability to care for self. Pt drowsy and reports stiffness in RLE. Pt was up in recliner for >3 hours and needed to change positions so modA transfer from recliner to bed taking only a few steps with RW and putting minimal wt on RLE due to pain.     Vision Baseline Vision/History: Wears glasses Wears Glasses: At all times Patient Visual Report: No change from baseline Vision Assessment?: No apparent visual deficits     Perception  Praxis      Pertinent Vitals/Pain Pain Assessment: 0-10 Pain Score: 5  Pain Location: RLE Pain Descriptors / Indicators: Stabbing Pain Intervention(s): Limited activity within patient's tolerance;Monitored during  session;Patient requesting pain meds-RN notified     Hand Dominance Right   Extremity/Trunk Assessment Upper Extremity Assessment Upper Extremity Assessment: Generalized weakness   Lower Extremity Assessment Lower Extremity Assessment: RLE deficits/detail RLE Deficits / Details: s/p sx RLE: Unable to fully assess due to pain RLE Sensation: decreased light touch   Cervical / Trunk Assessment Cervical / Trunk Assessment: Other exceptions Cervical / Trunk Exceptions: has spinal cord stimulator past few years   Communication Communication Communication: No difficulties   Cognition Arousal/Alertness: Awake/alert Behavior During Therapy: WFL for tasks assessed/performed Overall Cognitive Status: Within Functional Limits for tasks assessed                                 General Comments: .   General Comments  Pt pain reported in RLE and RN aware.    Exercises    Shoulder Instructions      Home Living Family/patient expects to be discharged to:: Private residence Living Arrangements: Spouse/significant other Available Help at Discharge: Family;Available PRN/intermittently Type of Home: House Home Access: Stairs to enter [employer] of Steps: 9 in back door; 10 in front door Entrance Stairs-Rails: Right Home Layout: Two level Alternate Level Stairs-Number of Steps: split level, 10 steps at entry and 10 steps to bed/bath Alternate Level Stairs-Rails: Right Bathroom Shower/Tub: Tub/shower unit (.)   Bathroom Toilet: Handicapped height     Home Equipment: Shower seat;Hand held Tourist information centre manager - 2 wheels          Prior Functioning/Environment Level of Independence: Independent        Comments: was functioning normally until 3 mos ago per pt and wife        OT Problem List: Decreased strength;Decreased activity tolerance;Impaired balance (sitting and/or standing);Impaired UE functional use;Pain;Increased edema;Cardiopulmonary status  limiting activity;Decreased safety awareness;Decreased knowledge of use of DME or AE;Decreased knowledge of precautions      OT Treatment/Interventions: Self-care/ADL training;Therapeutic exercise;Energy conservation;DME and/or AE instruction;Therapeutic activities;Patient/family education;Balance training    OT Goals(Current goals can be found in the care plan section) Acute Rehab OT Goals Patient Stated Goal: to walk OT Goal Formulation: With patient Time For Goal Achievement: 02/19/20 ADL Goals Pt Will Perform Grooming: with min guard assist;standing Pt Will Perform Lower Body Dressing: with min guard assist;sit to/from stand Pt Will Transfer to Toilet: with min guard assist;ambulating Pt Will Perform Toileting - Clothing Manipulation and hygiene: with min guard assist;sit to/from stand Pt/caregiver will Perform Home Exercise Program: Increased strength;With written HEP provided Additional ADL Goal #1: Pt will participate in x10 mins of ADL tasks with 2 seated rest breaks in order to increase activity tolerance.  OT Frequency: Min 2X/week   Barriers to D/C:            Co-evaluation              AM-PAC OT "6 Clicks" Daily Activity     Outcome Measure Help from another person eating meals?: None Help from another person taking care of personal grooming?: A Little Help from another person toileting, which includes using toliet, bedpan, or urinal?: A Little Help from another person bathing (including washing, rinsing, drying)?: A Lot Help from another person to put on and taking off regular upper body clothing?: A Little  Help from another person to put on and taking off regular lower body clothing?: A Lot 6 Click Score: 17   End of Session Equipment Utilized During Treatment: Gait belt;Rolling walker Nurse Communication: Mobility status  Activity Tolerance: Patient tolerated treatment well;Patient limited by pain;Patient limited by fatigue Patient left: in bed;with call  bell/phone within reach;with bed alarm set  OT Visit Diagnosis: Unsteadiness on feet (R26.81);Muscle weakness (generalized) (M62.81);Pain                Time: 1000-1024 OT Time Calculation (min): 24 min Charges:  OT General Charges $OT Visit: 1 Visit OT Evaluation $OT Eval Moderate Complexity: 1 Mod OT Treatments $Self Care/Home Management : 8-22 mins  Flora Lipps, OTR/L Acute Rehabilitation Services Pager: [phone] Office: [phone]   Infant Zink C 02/05/2020, 5:33 PM

## 2020-02-05 NOTE — Progress Notes (Addendum)
[facility]  Subjective  - Pain issues but he wants to stay with the percocet and not back on his Morphine 15 q 4 at home usually.   Objective 139/79 (!) 109 98.9 F (37.2 C) (Oral) 14 94%  Intake/Output Summary (Last 24 hours) at 02/05/2020 0736 Last data filed at 02/05/2020 0300 Gross per 24 hour  Intake 3838.36 ml  Output 3200 ml  Net 638.36 ml   Sitting up in bedside chair Minimal right ankle motor intact.  He has tenderness to palpation surrounding all of his incisions. Groin soft, all incisions are healing well Doppler signal brisk DP/PT Heart RRR    Assessment/Planning: POD # 1 Procedure: 1.  Harvest of right great saphenous vein 2.  Right common femoral endarterectomy with profundoplasty and bovine pericardial patch angioplasty 3.  Right common femoral artery to below-knee popliteal artery bypass with ipsilateral nonreversed great saphenous vein  Chronic pain issue for now will cont. With percocet  WBC leukocytosis is decreasing Cr stable, urine OP > 3700 ml last 24 hours. PT/OT eval and treat for mobility has been ordered  [name] 02/05/2020 7:36 AM --  Laboratory Lab Results: Recent Labs    02/04/20 1736 02/05/20 0217  WBC 18.0* 15.6*  HGB 10.8* 10.4*  HCT 32.8* 30.5*  PLT 435* 403*   BMET Recent Labs    02/04/20 0830 02/04/20 1037 02/05/20 0217  NA 134* 136 134*  K 4.4 4.5 4.3  CL 100  --  103  CO2 20*  --  20*  GLUCOSE 212*  --  176*  BUN 47*  --  28*  CREATININE 1.54*  --  1.13  CALCIUM 10.0  --  8.8*    COAG Lab Results  Component Value Date   INR 1.1 01/31/2020   INR 1.01 07/30/2015   INR 1.2 03/24/2008   No results found for: PTT   I have seen and evaluated the patient. I agree with the PA note as documented above.  Postop day 1 status post right common femoral endarterectomy with profundoplasty and a right common femoral to BK pop bypass with vein.  He has a very brisk PT signal  today.  This is improved from an ABI of 0.  Foot is warm.  Incisions look okay.  Labs overall reassuring.  Hgb 10.4.  Cr 1.13.  Will need PT OT.  Sitting up in a chair today.  Will work on pain control.  Marty Heck, MD [facility] Office: [phone]

## 2020-02-05 NOTE — Progress Notes (Signed)
Mobility Specialist - Progress Note   02/05/20 1700  Mobility  Activity Dangled on edge of bed  Level of Assistance Standby assist, set-up cues, supervision of patient - no hands on  Assistive Device None  Mobility Response Tolerated fair  Mobility performed by Mobility specialist  $Mobility charge 1 Mobility   Pre-mobility: 100 HR During mobility: 122 HR Post-mobility: 104 HR  Pt initially agreeable to practice sit-to-stands at bedside, however, once sitting up on the edge of bed he stated the pain in his R LE was too great to stand. He rated it a 10/10 and described it as a "lighting bolt" shooting down his leg. Pt sat up on edge of bed for several minutes, then laid back in bed.  Pricilla Handler Mobility Specialist Mobility Specialist Phone: [phone]

## 2020-02-05 NOTE — Evaluation (Signed)
Physical Therapy Evaluation Patient Details Name: Wayne Pratt MRN: [id number] DOB: 1960/12/07 Today's Date: 02/05/2020   History of Present Illness  Wayne Pratt is a 60 y.o. male, with history of hypertension, hyperlipidemia, DM, cx surgery, spinal cord stimlulator, and tobacco abuse with RLE pain due to PAD that was limiting his function. Underwent R femoral endarterecomy, R FPBG 02/04/20.  Clinical Impression  Pt admitted with above diagnosis. Pt limited by pain on eval. Pt reports he had been up with nursing and could not stand again at this time. Pt in R SL with RLE in hip flex, knee flex, and ankle pf. Discussed proper positioning for swelling as well as full ROM during healing. Pt rolled onto back with mod A and was positioned properly. Discussed and practiced ROM exercises at hip, knee, and ankle. Expect that pt will be able to go home but he has many stairs to get in and up to bedroom so had serious discussed about his need to mobilize to make this happen. Otherwise, he will need to go to rehab first. Pt wants to go home.  Pt currently with functional limitations due to the deficits listed below (see PT Problem List). Pt will benefit from skilled PT to increase their independence and safety with mobility to allow discharge to the venue listed below.       Follow Up Recommendations Home health PT    Equipment Recommendations  None recommended by PT    Recommendations for Other Services       Precautions / Restrictions Precautions Precautions: Fall Restrictions Weight Bearing Restrictions: No      Mobility  Bed Mobility Overal bed mobility: Needs Assistance (Simultaneous filing. User may not have seen previous data.) Bed Mobility: Sit to Supine (Simultaneous filing. User may not have seen previous data.) Rolling: Mod assist     Sit to supine: Mod assist   General bed mobility comments: ModA for maneuvering RLE onto bed (Simultaneous filing. User may not have seen previous  data.)    Transfers Overall transfer level: Needs assistance Equipment used: Rolling walker (2 wheeled) Transfers: Sit to/from [facility] Sit to Stand: Min assist Stand pivot transfers: Mod assist       General transfer comment: sit to stand x1 from recliner and hopping on LLE to take steps to bed. (Simultaneous filing. User may not have seen previous data.)  Ambulation/Gait             General Gait Details: pt deferred due to pain  Stairs            Wheelchair Mobility    Modified Rankin (Stroke Patients Only)       Balance Overall balance assessment: Needs assistance (Simultaneous filing. User may not have seen previous data.) Sitting-balance support: Bilateral upper extremity supported;Feet supported Sitting balance-Leahy Scale: Good     Standing balance support: Bilateral upper extremity supported Standing balance-Leahy Scale: Poor Standing balance comment: reliant heavily on RW                             Pertinent Vitals/Pain Pain Assessment: 0-10 Pain Score: 10-Worst pain ever Pain Location: RLE Pain Descriptors / Indicators: Stabbing Pain Intervention(s): Limited activity within patient's tolerance;Monitored during session;Patient requesting pain meds-RN notified    Home Living Family/patient expects to be discharged to:: Private residence Living Arrangements: Spouse/significant other Available Help at Discharge: Family;Available PRN/intermittently Type of Home: House Home Access: Stairs to enter Entrance Stairs-Rails:  Right Entrance Stairs-Number of Steps: 9 in back door; 10 in front door Home Layout: Two level Home Equipment: Shower seat;Hand held Tourist information centre manager - 2 wheels      Prior Function Level of Independence: Independent         Comments: was functioning normally until 3 mos ago per pt and wife     Hand Dominance   Dominant Hand: Right    Extremity/Trunk Assessment   Upper Extremity  Assessment Upper Extremity Assessment: Defer to OT evaluation    Lower Extremity Assessment Lower Extremity Assessment: RLE deficits/detail RLE Deficits / Details: pt prefers to lie on R side with RLE in hip flex, knee flex, and ankle pf. Swelling noted RLE. lacking 10 deg knee ext and ankle tight. Discussed ROM issues with pt and wife. Strength: hip flex 2+/5, knee ext 3-/5 RLE: Unable to fully assess due to pain RLE Sensation: decreased light touch    Cervical / Trunk Assessment Cervical / Trunk Assessment: Other exceptions Cervical / Trunk Exceptions: has spinal cord stimulator past few years  Communication   Communication: No difficulties  Cognition Arousal/Alertness: Awake/alert Behavior During Therapy: WFL for tasks assessed/performed Overall Cognitive Status: Within Functional Limits for tasks assessed                                 General Comments: .      General Comments General comments (skin integrity, edema, etc.): Pt pain reported in RLE and RN aware. (Simultaneous filing. User may not have seen previous data.)    Exercises General Exercises - Lower Extremity Ankle Circles/Pumps: PROM;Right;10 reps;Supine Long Arc Quad: AAROM;Right;10 reps;Sidelying   Assessment/Plan    PT Assessment Patient needs continued PT services  PT Problem List Decreased strength;Decreased range of motion;Decreased activity tolerance;Decreased balance;Decreased mobility;Decreased knowledge of use of DME;Pain;Impaired sensation       PT Treatment Interventions DME instruction;Gait training;Stair training;Functional mobility training;Therapeutic activities;Therapeutic exercise;Balance training;Patient/family education    PT Goals (Current goals can be found in the Care Plan section)  Acute Rehab PT Goals Patient Stated Goal: to walk (Simultaneous filing. User may not have seen previous data.) PT Goal Formulation: With patient/family Time For Goal Achievement:  02/19/20 Potential to Achieve Goals: Good    Frequency Min 4X/week   Barriers to discharge Inaccessible home environment many stairs    Co-evaluation               AM-PAC PT "6 Clicks" Mobility  Outcome Measure Help needed turning from your back to your side while in a flat bed without using bedrails?: A Lot Help needed moving from lying on your back to sitting on the side of a flat bed without using bedrails?: A Lot Help needed moving to and from a bed to a chair (including a wheelchair)?: A Lot Help needed standing up from a chair using your arms (e.g., wheelchair or bedside chair)?: A Lot Help needed to walk in hospital room?: A Lot Help needed climbing 3-5 steps with a railing? : Total 6 Click Score: 11    End of Session   Activity Tolerance: Patient limited by pain Patient left: in bed;with call bell/phone within reach;with family/visitor present Nurse Communication: Mobility status PT Visit Diagnosis: Pain;Difficulty in walking, not elsewhere classified (R26.2) Pain - Right/Left: Right Pain - part of body: Leg    Time: 1610-9604 PT Time Calculation (min) (ACUTE ONLY): 26 min   Charges:   PT Evaluation $PT  Eval Moderate Complexity: 1 Mod PT Treatments $Therapeutic Activity: 8-22 mins        Leighton Roach, PT  Acute Rehab Services  Pager [phone] Office [place] 02/05/2020, 5:04 PM

## 2020-02-05 NOTE — Consult Note (Addendum)
[place] Nurse Consult Note: Reason for Consult: Consult requested for right posterior leg. Vascular team has been following post-procedure. Pt recently had a revascularization procedure performed; previously his ABI was reported to be 0.  Wound type: During the time when he had insufficient blood supply, he developed a full thickness venous insufficiency wound to the right posterior calf.  5X7X.1cm, dark red-brown and dry, no odor, drainage, or fluctuance.  Wife at bedside; discussed plan of care and how to perform dressing changes to the location after discharge. Dressing procedure/placement/frequency: Topical treatment orders provided for bedside nurses to perform as follows to assist with enzymatic debridement of nonviable tissue as follows: Apply Santyl to right posterior leg Q day, then cover with moist gauze and foam dressing.  (Change foam dressing Q 3 days or PRN soiling.)  Pt should follow-up with the vascular team after discharge for further plan of care. Please re-consult if further assistance is needed.  Thank-you,  Julien Girt MSN, [place], [place], [place], [place]

## 2020-02-05 NOTE — Anesthesia Postprocedure Evaluation (Signed)
Anesthesia Post Note  Patient: Wayne Pratt  Procedure(s) Performed: RIGHT FEMORAL ENDARTERECTOMY with Profundaplasty (Right Groin) BYPASS GRAFT FEMORAL-POPLITEAL ARTERY With Vein Harvest (Right Leg Upper) VEIN HARVEST Greater Saphenous Vein (Right Leg Upper) PATCH ANGIOPLASTY (Right Leg Upper)     Patient location during evaluation: PACU Anesthesia Type: General Level of consciousness: awake and alert Pain management: pain level controlled Vital Signs Assessment: post-procedure vital signs reviewed and stable Respiratory status: spontaneous breathing, nonlabored ventilation, respiratory function stable and patient connected to nasal cannula oxygen Cardiovascular status: blood pressure returned to baseline and stable Postop Assessment: no apparent nausea or vomiting Anesthetic complications: no   No complications documented.  Last Vitals:  Vitals:   02/05/20 1722 02/05/20 1955  BP: 139/78 (!) 144/74  Pulse: (!) 102 (!) 106  Resp: 17 16  Temp: 36.7 C 36.8 C  SpO2: 92% 97%    Last Pain:  Vitals:   02/05/20 2059  TempSrc:   PainSc: Asleep                 Belenda Cruise P Hanad Leino

## 2020-02-06 MED ORDER — ROSUVASTATIN CALCIUM 20 MG PO TABS
20.0000 mg | ORAL_TABLET | Freq: Every day | ORAL | Status: DC
  Administered 2020-02-06: 20 mg via ORAL
  Filled 2020-02-06: qty 1

## 2020-02-06 NOTE — Progress Notes (Signed)
Mobility Specialist - Progress Note   02/06/20 1623  Mobility  Activity  (Cancel)   Pt recently seen by PT and session was limited by pain. Will f/u as able.   Pricilla Handler Mobility Specialist Mobility Specialist Phone: [phone]

## 2020-02-06 NOTE — Progress Notes (Addendum)
  Progress Note    02/06/2020 7:32 AM 2 Days Post-Op  Subjective:  Still requiring IV pain medication   Vitals:   02/05/20 2339 02/06/20 0320  BP: 130/80 (!) 154/82  Pulse: (!) 103 (!) 107  Resp: 16 17  Temp: 98.3 F (36.8 C) 98.1 F (36.7 C)  SpO2: 96% 95%   Physical Exam: Lungs:  Non labored Incisions:  R groin, popliteal, and vein harvest incisions c/d/i Extremities:  R PT brisk by doppler Neurologic: A&O  CBC    Component Value Date/Time   WBC 15.6 (H) 02/05/2020 0217   RBC 3.35 (L) 02/05/2020 0217   HGB 10.4 (L) 02/05/2020 0217   HCT 30.5 (L) 02/05/2020 0217   PLT 403 (H) 02/05/2020 0217   MCV 91.0 02/05/2020 0217   MCH 31.0 02/05/2020 0217   MCHC 34.1 02/05/2020 0217   RDW 12.8 02/05/2020 0217   LYMPHSABS 3.2 01/18/2019 1618   MONOABS 0.7 01/18/2019 1618   EOSABS 0.2 01/18/2019 1618   BASOSABS 0.0 01/18/2019 1618    BMET    Component Value Date/Time   NA 134 (L) 02/05/2020 0217   K 4.3 02/05/2020 0217   CL 103 02/05/2020 0217   CO2 20 (L) 02/05/2020 0217   GLUCOSE 176 (H) 02/05/2020 0217   BUN 28 (H) 02/05/2020 0217   CREATININE 1.13 02/05/2020 0217   CREATININE 1.02 04/26/2011 1450   CALCIUM 8.8 (L) 02/05/2020 0217   GFRNONAA >60 02/05/2020 0217   GFRAA >60 01/18/2019 1618    INR    Component Value Date/Time   INR 1.1 01/31/2020 1403     Intake/Output Summary (Last 24 hours) at 02/06/2020 0732 Last data filed at 02/06/2020 0320 Gross per 24 hour  Intake 960 ml  Output 800 ml  Net 160 ml     Assessment/Plan:  60 y.o. male is s/p R femoral endarterectomy and femoral to popliteal bypass with vein 2 Days Post-Op   R foot well perfused with brisk PT by doppler PT recommending HH; TOC team consulted Wean from IV pain medication Home likely tomorrow   Dagoberto Ligas, PA-C [facility] [phone] 02/06/2020 7:32 AM  I have seen and evaluated the patient. I agree with the PA note as documented above.  Postop day  2 status post right femoral endarterectomy with profundoplasty and right femoral to below-knee pop bypass with vein.  Very brisk PT signal.  Incisions look good.  He did work with therapy yesterday and they recommended home health.  He does feel he needs 1 more day for pain control.  Potential discharge tomorrow.  Marty Heck, MD [facility] [place] Office: [phone]

## 2020-02-06 NOTE — TOC Initial Note (Signed)
Transition of Care (TOC) - Initial/Assessment Note  Marvetta Gibbons RN, BSN Transitions of Care Unit 4E- RN Case Manager See Treatment Team for direct phone #    Patient Details  Name: Wayne Pratt MRN: [id number] Date of Birth: 04/18/1960  Transition of Care [facility]) CM/SW Contact:    Dawayne Patricia, RN Phone Number: 02/06/2020, 4:06 PM  Clinical Narrative:                 Pt from home w/ wife, s/p  R femoral endarterectomy and femoral to popliteal bypass, order for HHPT noted, CM to bedside to speak with pt regarding transition of care needs. Pt today working on pain control. Per conversation with pt- will need RW and 3n1 for home, discussed [place] which pt is agreeable to. List provided for [facility] choice Per CMS guidelines from medicare.gov website with star ratings (copy placed in shadow chart)- per pt he does not have a preference and will defer to this writer to secure and agency on his behalf.  Address, phone # and PCP all confirmed with pt in epic.   Call made to [facility] for [place] referral- referral has been accepted.   Will call [employer] line for DME nees RW/3n1- and have delivered prior to discharge.    Expected Discharge Plan: [place] Barriers to Discharge: Continued Medical Work up   Patient Goals and CMS Choice Patient states their goals for this hospitalization and ongoing recovery are:: return home CMS Medicare.gov Compare Post Acute Care list provided to:: Patient Choice offered to / list presented to : Patient  Expected Discharge Plan and Services Expected Discharge Plan: [place]   Discharge Planning Services: CM Consult Post Acute Care Choice: [place] Living arrangements for the past 2 months: Single Family Home                 DME Arranged: 3-N-1,Walker rolling DME Agency: [name]       HH Arranged: PT HH Agency: [facility] [place] Date [facility] Agency Contacted: 02/06/20 Time [place]: 1450 Representative spoke with at [place]: [name]  Prior Living Arrangements/Services Living arrangements for the past 2 months: [place] with:: Spouse Patient language and need for interpreter reviewed:: Yes Do you feel safe going back to the place where you live?: Yes      Need for Family Participation in Patient Care: Yes (Comment) Care giver support system in place?: Yes (comment)   Criminal Activity/Legal Involvement Pertinent to Current Situation/Hospitalization: No - Comment as needed  Activities of Daily Living Home Assistive Devices/Equipment: Eyeglasses,Dentures (specify type),Raised toilet seat with rails,Hearing aid,CBG Meter,Scales,Brace (specify type) (knee) ADL Screening (condition at time of admission) Patient's cognitive ability adequate to safely complete daily activities?: Yes Is the patient deaf or have difficulty hearing?: Yes (rinning in ear) Does the patient have difficulty concentrating, remembering, or making decisions?: No Patient able to express need for assistance with ADLs?: Yes Does the patient have difficulty dressing or bathing?: No Independently performs ADLs?: Yes (appropriate for developmental age) Does the patient have difficulty walking or climbing stairs?: Yes (due to right pain) Weakness of Legs: Right Weakness of Arms/Hands: None  Permission Sought/Granted Permission sought to share information with : Facility Art therapist granted to share information with : Yes, Verbal Permission Granted     Permission granted to share info w AGENCY: HH/DME        Emotional Assessment Appearance:: Appears  stated age Attitude/Demeanor/Rapport: Engaged Affect (typically observed): Appropriate Orientation: : Oriented to Self,Oriented to Place,Oriented to  Time,Oriented to Situation Alcohol / Substance Use: Not Applicable Psych Involvement: No (comment)  Admission diagnosis:  Ischemic rest pain of lower  extremity [H06.237, I99.8] Patient Active Problem List   Diagnosis Date Noted  . Ischemic rest pain of lower extremity 02/04/2020  . Critical lower limb ischemia ([place]) 01/29/2020  . Hypertension 01/25/2020  . Crushing injury of finger 05/15/2019  . Neuropraxia of upper extremity, left, initial encounter 05/15/2019  . Abdominal pain, epigastric 06/20/2017  . Dilation of pancreatic duct 06/20/2017  . S/P insertion of spinal cord stimulator 08/01/2015  . Rectal bleeding 07/17/2014  . Anemia 07/17/2014  . Fatty liver 09/07/2011  . Diabetes mellitus ([place]) 09/07/2011  . Encounter for screening colonoscopy 09/07/2011  . Chronic pain 09/07/2011   PCP:  Leeanne Rio, MD Pharmacy:   [place] [place] [address] Phone: [phone] Fax: [phone]     Social Determinants of Health (SDOH) Interventions    Readmission Risk Interventions No flowsheet data found.

## 2020-02-06 NOTE — Progress Notes (Addendum)
Patient experiencing rash bilateral arms with itching. PA paged.  Daymon Larsen, RN

## 2020-02-06 NOTE — Progress Notes (Signed)
Physical Therapy Treatment Patient Details Name: Wayne Pratt MRN: [id number] DOB: December 01, 1960 Today's Date: 02/06/2020    History of Present Illness Wayne Pratt is a 60 y.o. male, with history of hypertension, hyperlipidemia, DM, cx surgery, spinal cord stimlulator, and tobacco abuse with RLE pain due to PAD that was limiting his function. Underwent R femoral endarterecomy, R FPBG 02/04/20.    PT Comments    Pt with slow progress due to pain.  He was able to transfer and stand with min A but not able to WB on R LE due to pain.  Pt had received pain meds prior to therapy. Pt completing transfers with only min A and Pt expected to progress well as pain improves.  He does have a w/c at home if needed.    Follow Up Recommendations  Home health PT     Equipment Recommendations  None recommended by PT (has RW, BSC, w/c)    Recommendations for Other Services       Precautions / Restrictions Precautions Precautions: Fall    Mobility  Bed Mobility Overal bed mobility: Needs Assistance Bed Mobility: Supine to Sit;Sit to Supine     Supine to sit: Supervision Sit to supine: Min assist   General bed mobility comments: Min A to return R leg to bed  Transfers Overall transfer level: Needs assistance Equipment used: Rolling walker (2 wheeled) Transfers: Sit to/from Stand Sit to Stand: Min guard         General transfer comment: Performed sit to stand x 2 with cues for safe hand placement  Ambulation/Gait Ambulation/Gait assistance: Min assist Gait Distance (Feet): 2 Feet Assistive device: Rolling walker (2 wheeled) Gait Pattern/deviations: Step-to pattern;Decreased stride length;Decreased weight shift to right Gait velocity: decreased   General Gait Details: Side steps to [facility]; pt performing TDWB on R LE due to pain   Stairs             Wheelchair Mobility    Modified Rankin (Stroke Patients Only)       Balance Overall balance assessment: Needs  assistance Sitting-balance support: Feet supported;No upper extremity supported Sitting balance-Leahy Scale: Good     Standing balance support: Bilateral upper extremity supported Standing balance-Leahy Scale: Poor Standing balance comment: required RW                            Cognition Arousal/Alertness: Awake/alert Behavior During Therapy: WFL for tasks assessed/performed Overall Cognitive Status: Within Functional Limits for tasks assessed                                        Exercises General Exercises - Lower Extremity Ankle Circles/Pumps: AROM;Both;10 reps;Supine Long Arc Quad: AROM;Right;10 reps;Seated    General Comments General comments (skin integrity, edema, etc.): VSS      Pertinent Vitals/Pain Pain Assessment: 0-10 Pain Score: 7  Pain Location: RLE Pain Descriptors / Indicators: Stabbing;Sharp;Burning Pain Intervention(s): Limited activity within patient's tolerance;Monitored during session;Premedicated before session    Home Living                      Prior Function            PT Goals (current goals can now be found in the care plan section) Acute Rehab PT Goals Patient Stated Goal: to walk PT Goal Formulation: With patient/family Time For  Goal Achievement: 02/19/20 Potential to Achieve Goals: Good Progress towards PT goals: Progressing toward goals    Frequency    Min 4X/week      PT Plan Current plan remains appropriate    Co-evaluation              AM-PAC PT "6 Clicks" Mobility   Outcome Measure  Help needed turning from your back to your side while in a flat bed without using bedrails?: None Help needed moving from lying on your back to sitting on the side of a flat bed without using bedrails?: A Little Help needed moving to and from a bed to a chair (including a wheelchair)?: A Little Help needed standing up from a chair using your arms (e.g., wheelchair or bedside chair)?: A  Little Help needed to walk in hospital room?: A Little Help needed climbing 3-5 steps with a railing? : A Lot 6 Click Score: 18    End of Session Equipment Utilized During Treatment: Gait belt Activity Tolerance: Patient limited by pain Patient left: in bed;with call bell/phone within reach;with family/visitor present;with bed alarm set Nurse Communication: Mobility status PT Visit Diagnosis: Pain;Difficulty in walking, not elsewhere classified (R26.2) Pain - Right/Left: Right Pain - part of body: Leg     Time: 1535-1555 PT Time Calculation (min) (ACUTE ONLY): 20 min  Charges:  $Therapeutic Activity: 8-22 mins                     Abran Richard, PT Acute Rehab Services Pager [phone] [facility] [phone]     Karlton Lemon 02/06/2020, 5:25 PM

## 2020-02-07 LAB — GLUCOSE, CAPILLARY: Glucose-Capillary: 232 mg/dL — ABNORMAL HIGH (ref 70–99)

## 2020-02-07 MED ORDER — OXYCODONE-ACETAMINOPHEN 5-325 MG PO TABS: 1.0000 | ORAL_TABLET | ORAL | 0 refills | Status: DC | PRN

## 2020-02-07 MED ORDER — ASPIRIN 81 MG PO TBEC: 81.0000 mg | DELAYED_RELEASE_TABLET | Freq: Every day | ORAL | 11 refills | Status: DC

## 2020-02-07 NOTE — Progress Notes (Addendum)
  Progress Note    02/07/2020 7:40 AM 3 Days Post-Op  Subjective:  Pain R foot   Vitals:   02/07/20 0010 02/07/20 0415  BP: 131/70 127/67  Pulse: (!) 104 94  Resp: 18 18  Temp: 99 F (37.2 C) 98.1 F (36.7 C)  SpO2: 99% 96%   Physical Exam: Lungs:  Non labored Incisions:  R groin and RLE incisions c/d/i Extremities:  Foot warm to touch with good cap refill; patient deferred doppler exam; first and second toes are darkening Neurologic: A&O  CBC    Component Value Date/Time   WBC 15.6 (H) 02/05/2020 0217   RBC 3.35 (L) 02/05/2020 0217   HGB 10.4 (L) 02/05/2020 0217   HCT 30.5 (L) 02/05/2020 0217   PLT 403 (H) 02/05/2020 0217   MCV 91.0 02/05/2020 0217   MCH 31.0 02/05/2020 0217   MCHC 34.1 02/05/2020 0217   RDW 12.8 02/05/2020 0217   LYMPHSABS 3.2 01/18/2019 1618   MONOABS 0.7 01/18/2019 1618   EOSABS 0.2 01/18/2019 1618   BASOSABS 0.0 01/18/2019 1618    BMET    Component Value Date/Time   NA 134 (L) 02/05/2020 0217   K 4.3 02/05/2020 0217   CL 103 02/05/2020 0217   CO2 20 (L) 02/05/2020 0217   GLUCOSE 176 (H) 02/05/2020 0217   BUN 28 (H) 02/05/2020 0217   CREATININE 1.13 02/05/2020 0217   CREATININE 1.02 04/26/2011 1450   CALCIUM 8.8 (L) 02/05/2020 0217   GFRNONAA >60 02/05/2020 0217   GFRAA >60 01/18/2019 1618    INR    Component Value Date/Time   INR 1.1 01/31/2020 1403     Intake/Output Summary (Last 24 hours) at 02/07/2020 0740 Last data filed at 02/07/2020 0100 Gross per 24 hour  Intake 980 ml  Output 1325 ml  Net -345 ml     Assessment/Plan:  60 y.o. male is s/p R femoral endarterectomy and femoral to popliteal bypass with vein 3 Days Post-Op   R foot warm and well perfused; 1st and 2nd toes darkening; can be monitored as an outpatient TOC arranged HH PT Tentative plan is for patient to walk again this morning and discharge home this afternoon   Dagoberto Ligas, PA-C [facility] [phone] 02/07/2020 7:40  AM  I have seen and evaluated the patient. I agree with the PA note as documented above.  60 year old male now postop day 3 status post right common femoral endarterectomy with profundoplasty and a right common femoral to below-knee pop bypass with vein.  He continues to have a very brisk PT signal at the ankle and had an ABI of 0 prior to the case.  Ultimately he is ambulating and PT has recommended discharge with home health.  He seems appropriate for discharge today and feels he is ready to go.  We discussed follow-up in 2 to 3 weeks for incision check in our office.  I want him to keep a dry dressing in his groin.  All of his incisions look good at this time.  We will give him a prescription for Percocet and he does have his chronic pain medicine as well.  Has made good progress.  Marty Heck, MD [facility] [place] Office: [phone]

## 2020-02-07 NOTE — Discharge Instructions (Signed)
 [facility] [place]  Discharge instructions  Lower Extremity Bypass Surgery  Please refer to the following instruction for your post-procedure care. Your surgeon or physician assistant will discuss any changes with you.  Activity  You are encouraged to walk as much as you can. You can slowly return to normal activities during the month after your surgery. Avoid strenuous activity and heavy lifting until your doctor tells you it's OK. Avoid activities such as vacuuming or swinging a golf club. Do not drive until your doctor give the OK and you are no longer taking prescription pain medications. It is also normal to have difficulty with sleep habits, eating and bowel movement after surgery. These will go away with time.  Bathing/Showering  You may shower after you go home. Do not soak in a bathtub, hot tub, or swim until the incision heals completely.  Incision Care  Clean your incision with mild soap and water. Shower every day. Pat the area dry with a clean towel. You do not need a bandage unless otherwise instructed. Do not apply any ointments or creams to your incision. If you have open wounds you will be instructed how to care for them or a visiting nurse may be arranged for you. If you have staples or sutures along your incision they will be removed at your post-op appointment. You may have skin glue on your incision. Do not peel it off. It will come off on its own in about one week. If you have a great deal of moisture in your groin, use a gauze help keep this area dry.  Diet  Resume your normal diet. There are no special food restrictions following this procedure. A low fat/ low cholesterol diet is recommended for all patients with vascular disease. In order to heal from your surgery, it is CRITICAL to get adequate nutrition. Your body requires vitamins, minerals, and protein. Vegetables are the best source of vitamins and minerals. Vegetables also provide the  perfect balance of protein. Processed food has little nutritional value, so try to avoid this.  Medications  Resume taking all your medications unless your doctor or nurse practitioner tells you not to. If your incision is causing pain, you may take over-the-counter pain relievers such as acetaminophen (Tylenol). If you were prescribed a stronger pain medication, please aware these medication can cause nausea and constipation. Prevent nausea by taking the medication with a snack or meal. Avoid constipation by drinking plenty of fluids and eating foods with high amount of fiber, such as fruits, vegetables, and grains. Take Colase 100 mg (an over-the-counter stool softener) twice a day as needed for constipation. Do not take Tylenol if you are taking prescription pain medications.  Follow Up  Our office will schedule a follow up appointment 2-3 weeks following discharge.  Please call us immediately for any of the following conditions  Severe or worsening pain in your legs or feet while at rest or while walking Increase pain, redness, warmth, or drainage (pus) from your incision site(s) Fever of 101 degree or higher The swelling in your leg with the bypass suddenly worsens and becomes more painful than when you were in the hospital If you have been instructed to feel your graft pulse then you should do so every day. If you can no longer feel this pulse, call the office immediately. Not all patients are given this instruction.  Leg swelling is common after leg bypass surgery.  The swelling should improve over a few months   following surgery. To improve the swelling, you may elevate your legs above the level of your heart while you are sitting or resting. Your surgeon or physician assistant may ask you to apply an ACE wrap or wear compression (TED) stockings to help to reduce swelling.  Reduce your risk of vascular disease  Stop smoking. If you would like help call [facility] at 1-800-QUIT-NOW  (1-800-784-8669) or [place] at 336-586-4000.  Manage your cholesterol Maintain a desired weight Control your diabetes weight Control your diabetes Keep your blood pressure down  If you have any questions, please call the office at [phone]   

## 2020-02-07 NOTE — Progress Notes (Signed)
Physical Therapy Treatment Patient Details Name: Wayne Pratt MRN: [id number] DOB: [birth year] Today's Date: 02/07/2020    History of Present Illness Wayne Pratt is a 60 y.o. male, with history of hypertension, hyperlipidemia, DM, cx surgery, spinal cord stimlulator, and tobacco abuse with RLE pain due to PAD that was limiting his function. Underwent R femoral endarterecomy, R FPBG 02/04/20.    PT Comments    Pt supine in bed on arrival this session.  Pt continues to benefit from skilled rehab in home setting.  Pt issued gt belt and educated on frequency to improved ROM to RLE and continue to progress acceptance of weight on his RLE.  Pt at bedside and wife present.  Pt awaiting d/c home at conclusion of tx.     Follow Up Recommendations  Home health PT     Equipment Recommendations  None recommended by PT    Recommendations for Other Services       Precautions / Restrictions Precautions Precautions: Fall Restrictions Weight Bearing Restrictions: No    Mobility  Bed Mobility Overal bed mobility: Needs Assistance Bed Mobility: Supine to Sit     Supine to sit: Supervision     General bed mobility comments: guarded R LE to edge of bed.  Transfers Overall transfer level: Needs assistance Equipment used: Rolling walker (2 wheeled) Transfers: Sit to/from Stand Sit to Stand: Supervision         General transfer comment: Cues for hand placement.  pt standing on LLE only due to pain.  Cues for accepting weight into RLE.  Ambulation/Gait Ambulation/Gait assistance: Supervision Gait Distance (Feet): 20 Feet Assistive device: Rolling walker (2 wheeled) Gait Pattern/deviations: Step-to pattern;Decreased stride length;Decreased weight shift to right     General Gait Details: Pt continues with TDWB despite cues for accepting weight on RLE.   Stairs Stairs: Yes Stairs assistance: Min assist Stair Management: Two rails Number of Stairs: 1 General stair comments: Cues  for sequencing.  Pt did accept weight to LLE during stair training.  Increased pain noted.  Performed forward to ascend and backward to descend.   Wheelchair Mobility    Modified Rankin (Stroke Patients Only)       Balance Overall balance assessment: Needs assistance Sitting-balance support: Feet supported;No upper extremity supported Sitting balance-Leahy Scale: Good       Standing balance-Leahy Scale: Fair                              Cognition Arousal/Alertness: Awake/alert Behavior During Therapy: WFL for tasks assessed/performed Overall Cognitive Status: Within Functional Limits for tasks assessed                                        Exercises Other Exercises Other Exercises: Heel cord HS stretch with strap. 2 x 15 sec holds.  Edcuated on frequency to perform at home and issued gt belt for home use.    General Comments        Pertinent Vitals/Pain Pain Assessment: 0-10 Pain Score: 7  Pain Location: RLE Pain Descriptors / Indicators: Stabbing;Sharp;Burning Pain Intervention(s): Monitored during session;Repositioned    Home Living                      Prior Function            PT Goals (current goals can  now be found in the care plan section) Acute Rehab PT Goals Patient Stated Goal: to walk Potential to Achieve Goals: Good Progress towards PT goals: Progressing toward goals    Frequency    Min 4X/week      PT Plan Current plan remains appropriate    Co-evaluation              AM-PAC PT "6 Clicks" Mobility   Outcome Measure  Help needed turning from your back to your side while in a flat bed without using bedrails?: None Help needed moving from lying on your back to sitting on the side of a flat bed without using bedrails?: A Little Help needed moving to and from a bed to a chair (including a wheelchair)?: A Little Help needed standing up from a chair using your arms (e.g., wheelchair or bedside  chair)?: A Little Help needed to walk in hospital room?: A Little Help needed climbing 3-5 steps with a railing? : A Little 6 Click Score: 19    End of Session Equipment Utilized During Treatment: Gait belt Activity Tolerance: Patient limited by pain Patient left: in bed;with call bell/phone within reach;with family/visitor present (seated edge of bed awaiting d/c home.) Nurse Communication: Mobility status PT Visit Diagnosis: Pain;Difficulty in walking, not elsewhere classified (R26.2) Pain - Right/Left: Right Pain - part of body: Leg     Time: 1207-1229 PT Time Calculation (min) (ACUTE ONLY): 22 min  Charges:  $Gait Training: 8-22 mins                     Erasmo Leventhal , PTA Acute Rehabilitation Services Pager [phone] Office [phone]     Lelar Farewell Eli Hose 02/07/2020, 2:19 PM

## 2020-02-07 NOTE — Progress Notes (Signed)
Mobility Specialist: Progress Note   02/07/20 1227  Mobility  Activity  (Cancel)   Pt working with PT and just finished getting dressed for discharge.   [facility] Miron Marxen Mobility Specialist

## 2020-02-07 NOTE — TOC Transition Note (Signed)
Transition of Care (TOC) - CM/SW Discharge Note Marvetta Gibbons RN, BSN Transitions of Care Unit 4E- RN Case Manager See Treatment Team for direct phone #    Patient Details  Name: Wayne Pratt MRN: [id number] Date of Birth: 1960/05/29  [facility]) CM/SW Contact:  Dawayne Patricia, RN Phone Number: 02/07/2020, 11:39 AM   Clinical Narrative:    Pt stable for transition home today, [name] order in place and referral has been accepted by [name]- notified [name] with [facility] of transition home for start of care, they will contact pt within 48 hr post discharge.  Call made to [employer] line for DME needs- RW and 3n2- DME to be delivered to room prior to discharge.      Final next level of care: [name] Barriers to Discharge: Barriers Resolved   Patient Goals and CMS Choice Patient states their goals for this hospitalization and ongoing recovery are:: return home CMS Medicare.gov Compare Post Acute Care list provided to:: Patient Choice offered to / list presented to : Patient  Discharge Placement                 Home with [name]      Discharge Plan and Services   Discharge Planning Services: CM Consult Post Acute Care Choice: [name]          DME Arranged: 3-N-1,Walker rolling DME Agency: [name] Date DME Agency Contacted: 02/07/20 Time DME Agency Contacted: [id number] Representative spoke with at DME Agency: [name] HH Arranged: [name]: [name] Date [name]: 02/06/20 Time [name]: [name] Representative spoke with at [place]: [name] Interventions     Readmission Risk Interventions Readmission Risk Prevention Plan 02/07/2020  Transportation Screening Complete  PCP or Specialist Appt within 5-7 Days Complete  Home Care Screening Complete  Medication Review (RN CM) Complete  Some recent data might be hidden

## 2020-02-11 ENCOUNTER — Telehealth: Payer: Self-pay

## 2020-02-11 NOTE — Telephone Encounter (Signed)
Patient seen on 1/27 prior to d/c from fem endart/bypass on 1/24. Says the big and little toes on his right foot are still a darker purple and last night they noticed that there was a pinhole in the pinky toe that was draining a scant amount of clear/yellowish fluid. Foot is swollen and slightly sore, denies redness, odor, fever. Put patient on for wound check this week. Knows to call sooner if redness, odor, fever, or copious drainage develops.

## 2020-02-11 NOTE — Discharge Summary (Signed)
Bypass Discharge Summary Patient ID: Wayne Pratt [id number] 59 y.o. 1960/03/16  Admit date: 02/04/2020  Discharge date and time: 02/07/2020 12:43 PM   Admitting Physician: Marty Heck, MD   Discharge Physician: same  Admission Diagnoses: Ischemic rest pain of lower extremity [M79.606, I99.8]  Discharge Diagnoses: same  Admission Condition: poor  Discharged Condition: fair  Indication for Admission: Critical limb ischemia of the right lower extremity with ischemic rest pain  Hospital Course: [name] is a 60 year old male with critical limb ischemia of the right lower extremity with ischemic rest pain who was brought in as an outpatient and underwent right common femoral endarterectomy with bovine patch angioplasty and femoral to below the knee popliteal bypass with vein by Dr. Carlis Pratt on 02/04/2020.  He tolerated the procedure well and was admitted to the hospital postoperatively.  Postoperative course was unremarkable.  Based on PT/OT evaluation patient was recommended home health PT which was arranged by the transitions of care team.  Hospital stay was about 1 to 2 days longer than estimated due to postoperative pain control as patient has chronic pain related to his lower back.  Patient eventually was weaned from IV pain medication and ready for discharge home.  He will follow-up in 2 to 3 weeks with Dr. Carlis Pratt.  He was prescribed narcotic pain medication for postoperative pain control.  He was discharged home in stable condition.  At the time of discharge it should be noted that patient had a brisk PTA signal by Doppler.  Consults: None  Treatments: surgery: Right common femoral endarterectomy with bovine patch angioplasty and right femoral to below the knee popliteal bypass with vein by Dr. Carlis Pratt on 02/04/2020    Disposition: Discharge disposition: 01-Home or Self Care       - For [facility] Registry use ---  Post-op:  Wound infection: No  Graft infection:  No  Transfusion: No   New Arrhythmia: No Patency judged by: [x ] Dopper only, [ ]  Palpable graft pulse, [ ]  Palpable distal pulse, [ ]  ABI inc. > 0.15, [ ]  Duplex D/C Ambulatory Status: Ambulatory with Assistance  Complications: MI: [ x] No, [ ]  Troponin only, [ ]  EKG or Clinical CHF: No Resp failure: [ x] none, [ ]  Pneumonia, [ ]  Ventilator Chg in renal function: [ x] none, [ ]  Inc. Cr > 0.5, [ ]  Temp. Dialysis, [ ]  Permanent dialysis Stroke: [x ] None, [ ]  Minor, [ ]  Major Return to OR: No  Reason for return to OR: [ ]  Bleeding, [ ]  Infection, [ ]  Thrombosis, [ ]  Revision  Discharge medications: Statin use:  Yes ASA use:  Yes Plavix use:  No  for medical reason Not indicated Beta blocker use: No  for medical reason Not indicated Coumadin use: No  for medical reason Not indicated    Patient Instructions:  Allergies as of 02/07/2020      Reactions   Penicillins Anaphylaxis, Other (See Comments)   Has patient had a PCN reaction causing immediate rash, facial/tongue/throat swelling, SOB or lightheadedness with hypotension: Yes Has patient had a PCN reaction causing severe rash involving mucus membranes or skin necrosis: Yes Has patient had a PCN reaction that required hospitalization: No Has patient had a PCN reaction occurring within the last 10 years: No If all of the above answers are "NO", then may proceed with Cephalosporin use.   Ativan [lorazepam] Other (See Comments)   Violent and Mean       Medication List  TAKE these medications   albuterol 108 (90 Base) MCG/ACT inhaler Commonly known as: VENTOLIN HFA Inhale 1-2 puffs into the lungs every 6 (six) hours as needed for wheezing or shortness of breath.   aspirin 81 MG EC tablet Take 1 tablet (81 mg total) by mouth daily at 6 (six) AM. Swallow whole.   azithromycin 250 MG tablet Commonly known as: ZITHROMAX Take 1 tablet (250 mg total) by mouth daily. Take first 2 tablets together, then 1 every day until  finished.   benzonatate 100 MG capsule Commonly known as: TESSALON Take 1 capsule (100 mg total) by mouth every 8 (eight) hours.   clonazePAM 0.5 MG tablet Commonly known as: KLONOPIN Take 0.5 mg by mouth 2 (two) times daily.   cyclobenzaprine 10 MG tablet Commonly known as: FLEXERIL Take 10 mg by mouth 3 (three) times daily.   glipiZIDE XL 10 MG 24 hr tablet Generic drug: glipiZIDE Take 10 mg by mouth 2 (two) times daily.   HYDROmorphone 4 MG tablet Commonly known as: Dilaudid Take 1 tablet (4 mg total) by mouth every 6 (six) hours as needed for severe pain.   IBU 800 MG tablet Generic drug: ibuprofen Take 800 mg by mouth every 8 (eight) hours.   lisinopril 10 MG tablet Commonly known as: ZESTRIL TAKE 1 TABLET BY MOUTH ONCE A DAY.   lovastatin 40 MG tablet Commonly known as: MEVACOR Take 40 mg by mouth at bedtime.   Melatonin 10 MG Tabs Take 40 mg by mouth at bedtime.   metFORMIN 1000 MG tablet Commonly known as: GLUCOPHAGE Take 1,000 mg by mouth 2 (two) times daily.   methocarbamol 750 MG tablet Commonly known as: ROBAXIN Take 750 mg by mouth every 8 (eight) hours.   morphine 15 MG tablet Commonly known as: MSIR Take 1 tablet by mouth every 4 (four) hours as needed for pain.   omeprazole 20 MG capsule Commonly known as: PRILOSEC TAKE 1 CAPSULE ONCE DAILY 30 MINUTES BEFORE YOUR FIRST MEAL.   ondansetron 4 MG disintegrating tablet Commonly known as: Zofran ODT 4mg  ODT q4 hours prn nausea/vomit Notes to patient: Duplicate    ondansetron 8 MG tablet Commonly known as: Zofran Take 1 tablet (8 mg total) by mouth every 4 (four) hours as needed.   Oxycodone HCl 10 MG Tabs Take 1 tablet (10 mg total) by mouth every 4 (four) hours.   oxyCODONE-acetaminophen 5-325 MG tablet Commonly known as: Percocet Take 1 tablet by mouth every 4 (four) hours as needed.   pantoprazole 40 MG tablet Commonly known as: PROTONIX TAKE 1 TABLET BY MOUTH 30 MINUTES PRIOR TO  BREAKFAST. What changed: See the new instructions.   pioglitazone 15 MG tablet Commonly known as: ACTOS Take 15 mg by mouth at bedtime.   predniSONE 10 MG tablet Commonly known as: DELTASONE Take 2 tablets (20 mg total) by mouth daily.      Activity: activity as tolerated Diet: regular diet Wound Care: keep wound clean and dry  Follow-up with Dr. Carlis Pratt in 3 weeks.  SignedDagoberto Ligas 02/11/2020 8:30 AM

## 2020-02-15 ENCOUNTER — Ambulatory Visit: Payer: PPO

## 2020-02-18 DIAGNOSIS — Z7984 Long term (current) use of oral hypoglycemic drugs: Secondary | ICD-10-CM | POA: Diagnosis not present

## 2020-02-18 DIAGNOSIS — Z8701 Personal history of pneumonia (recurrent): Secondary | ICD-10-CM | POA: Diagnosis not present

## 2020-02-18 DIAGNOSIS — F17210 Nicotine dependence, cigarettes, uncomplicated: Secondary | ICD-10-CM | POA: Diagnosis not present

## 2020-02-18 DIAGNOSIS — E7800 Pure hypercholesterolemia, unspecified: Secondary | ICD-10-CM | POA: Diagnosis not present

## 2020-02-18 DIAGNOSIS — Z48812 Encounter for surgical aftercare following surgery on the circulatory system: Secondary | ICD-10-CM | POA: Diagnosis not present

## 2020-02-18 DIAGNOSIS — M479 Spondylosis, unspecified: Secondary | ICD-10-CM | POA: Diagnosis not present

## 2020-02-18 DIAGNOSIS — Z9181 History of falling: Secondary | ICD-10-CM | POA: Diagnosis not present

## 2020-02-18 DIAGNOSIS — D649 Anemia, unspecified: Secondary | ICD-10-CM | POA: Diagnosis not present

## 2020-02-18 DIAGNOSIS — Z7952 Long term (current) use of systemic steroids: Secondary | ICD-10-CM | POA: Diagnosis not present

## 2020-02-18 DIAGNOSIS — E785 Hyperlipidemia, unspecified: Secondary | ICD-10-CM | POA: Diagnosis not present

## 2020-02-18 DIAGNOSIS — E1151 Type 2 diabetes mellitus with diabetic peripheral angiopathy without gangrene: Secondary | ICD-10-CM | POA: Diagnosis not present

## 2020-02-18 DIAGNOSIS — I10 Essential (primary) hypertension: Secondary | ICD-10-CM | POA: Diagnosis not present

## 2020-02-18 DIAGNOSIS — G4700 Insomnia, unspecified: Secondary | ICD-10-CM | POA: Diagnosis not present

## 2020-02-18 DIAGNOSIS — G4730 Sleep apnea, unspecified: Secondary | ICD-10-CM | POA: Diagnosis not present

## 2020-02-18 DIAGNOSIS — Z7982 Long term (current) use of aspirin: Secondary | ICD-10-CM | POA: Diagnosis not present

## 2020-02-19 ENCOUNTER — Telehealth: Payer: Self-pay

## 2020-02-19 NOTE — Telephone Encounter (Signed)
Gave Amy from [facility] PT verbal orders for PT 2x a week for 3 weeks and to have a [facility] nurse come out and evaluate.

## 2020-02-26 ENCOUNTER — Other Ambulatory Visit: Payer: Self-pay

## 2020-02-26 ENCOUNTER — Ambulatory Visit (INDEPENDENT_AMBULATORY_CARE_PROVIDER_SITE_OTHER): Payer: Self-pay | Admitting: Vascular Surgery

## 2020-02-26 ENCOUNTER — Encounter: Payer: Self-pay | Admitting: Vascular Surgery

## 2020-02-26 VITALS — BP 138/84 | HR 118 | Temp 97.5°F | Resp 16 | Ht 73.0 in | Wt 208.0 lb

## 2020-02-26 DIAGNOSIS — I70229 Atherosclerosis of native arteries of extremities with rest pain, unspecified extremity: Secondary | ICD-10-CM

## 2020-02-26 MED ORDER — OXYCODONE-ACETAMINOPHEN 5-325 MG PO TABS
1.0000 | ORAL_TABLET | ORAL | 0 refills | Status: DC | PRN
Start: 2020-02-26 — End: 2020-03-06

## 2020-02-26 NOTE — Progress Notes (Signed)
Patient name: Wayne Pratt MRN: [id number] DOB: [birth year] Sex: male  REASON FOR VISIT: Post-op check after right leg bypass for CLI  HPI: MELTON WALLS is a 60 y.o. male with multiple medical problems including DM, HTN, HLD who presents for postop check after right lower extremity bypass.  Ultimately he underwent right common femoral endarterectomy with profundoplasty and bovine patch with a right common femoral to below-knee pop bypass with saphenous vein on 02/04/2020 for CLI with rest pain.  At the time of his bypass had advanced ischemia with ABIs that were 0.  He states that his right first, second, and fifth toes have turned black.  The pain in his foot is much better other than the tissue loss on the toes.  He also has an area on the back of his right calf that has been very painful.  Past Medical History:  Diagnosis Date  . Anemia    takes Iron pill daily  . Anxiety    takes Xanax daily  . Arthritis    Patient denies  . Asthma   . Cancer ([place])    skin   . Diabetes mellitus ([place])    takes Metformin daily  . Headache    migraine  - not lately  . History of bronchitis    mid 90's  . History of hypertension    lost weight and been off meds over a yr  . History of kidney stones    Passed 32  . History of pleural empyema   . Hypercholesterolemia    lost weight and been off meds over a yr  . Hypertension   . Insomnia    takes Trazodone nightly  . Kidney stones    22 times  . Neuropathy   . Pneumonia    hx of-15+ yrs ago  . Sleep apnea    Stop Bang score of 7  . Weakness    numbness and tingling    Past Surgical History:  Procedure Laterality Date  . ABDOMINAL AORTOGRAM W/LOWER EXTREMITY N/A 01/31/2020   Procedure: ABDOMINAL AORTOGRAM W/LOWER EXTREMITY;  Surgeon: Marty Heck, MD;  Location: [facility];  Service: Cardiovascular;  Laterality: N/A;  . BIOPSY  10/05/2011   Dr. Oneida Alar :non-erosive gastritis  . BIOPSY  08/06/2014   Procedure: BIOPSY;   Surgeon: Danie Binder, MD;  Location: [facility];  Service: Endoscopy;;  . CERVICAL DISC SURGERY    . ELBOW SURGERY Right   . ENDARTERECTOMY FEMORAL Right 02/04/2020   Procedure: RIGHT FEMORAL ENDARTERECTOMY with Profundaplasty;  Surgeon: Marty Heck, MD;  Location: [facility];  Service: Vascular;  Laterality: Right;  . ESOPHAGOGASTRODUODENOSCOPY  Sept 2013   Dr. Oneida Alar: chronic inactive gastritis   . ESOPHAGOGASTRODUODENOSCOPY N/A 08/18/2017   Procedure: ESOPHAGOGASTRODUODENOSCOPY (EGD);  Surgeon: Milus Banister, MD;  Location: [facility];  Service: Endoscopy;  Laterality: N/A;  . ESOPHAGOGASTRODUODENOSCOPY N/A 03/09/2018   Procedure: ESOPHAGOGASTRODUODENOSCOPY (EGD);  Surgeon: Milus Banister, MD;  Location: [facility];  Service: Endoscopy;  Laterality: N/A;  . ESOPHAGOGASTRODUODENOSCOPY (EGD) WITH PROPOFOL N/A 08/06/2014   mild non-erosive gastritis, duodenitis  . EUS N/A 08/18/2017   Procedure: UPPER ENDOSCOPIC ULTRASOUND (EUS) RADIAL;  Surgeon: Milus Banister, MD;  Location: [facility];  Service: Endoscopy;  Laterality: N/A;  . EUS N/A 03/09/2018   Procedure: UPPER ENDOSCOPIC ULTRASOUND (EUS) RADIAL;  Surgeon: Milus Banister, MD;  Location: [facility];  Service: Endoscopy;  Laterality: N/A;  . FEMORAL-POPLITEAL BYPASS GRAFT Right 02/04/2020  Procedure: BYPASS GRAFT FEMORAL-POPLITEAL ARTERY With Vein Harvest;  Surgeon: Marty Heck, MD;  Location: [facility];  Service: Vascular;  Laterality: Right;  . FINE NEEDLE ASPIRATION  03/09/2018   Procedure: FINE NEEDLE ASPIRATION;  Surgeon: Milus Banister, MD;  [facility];  Service: Endoscopy;;  . FLEXIBLE SIGMOIDOSCOPY  10/05/2011   Dr. Fields:poor prep  . LUNG SURGERY  2010   for empyema  . PATCH ANGIOPLASTY Right 02/04/2020   Procedure: PATCH ANGIOPLASTY;  Surgeon: Marty Heck, MD;  Location: [facility];  Service: Vascular;  Laterality: Right;  . SPINAL CORD STIMULATOR INSERTION N/A 08/01/2015   Procedure: Spinal  Cord Stimulator Implant;  Surgeon: Eustace Moore, MD;  Location: [facility];  Service: Neurosurgery;  Laterality: N/A;  . TONSILLECTOMY    . VASECTOMY Bilateral 1995  . VEIN HARVEST Right 02/04/2020   Procedure: VEIN HARVEST Greater Saphenous Vein;  Surgeon: Marty Heck, MD;  Location: [facility] OR;  Service: Vascular;  Laterality: Right;    Family History  Problem Relation Age of Onset  . Prostate cancer Other        all male members on mother's side  . Heart attack Father   . COPD Maternal Grandmother   . Cancer Paternal Grandmother   . Colon cancer Neg Hx     SOCIAL HISTORY: Social History   Tobacco Use  . Smoking status: Current Every Day Smoker    Packs/day: 1.00    Years: 39.00    Pack years: 39.00    Types: Cigarettes  . Smokeless tobacco: Never Used  Substance Use Topics  . Alcohol use: No    Alcohol/week: 0.0 standard drinks    Allergies  Allergen Reactions  . Penicillins Anaphylaxis and Other (See Comments)    Has patient had a PCN reaction causing immediate rash, facial/tongue/throat swelling, SOB or lightheadedness with hypotension: Yes Has patient had a PCN reaction causing severe rash involving mucus membranes or skin necrosis: Yes Has patient had a PCN reaction that required hospitalization: No Has patient had a PCN reaction occurring within the last 10 years: No If all of the above answers are "NO", then may proceed with Cephalosporin use.   . Ativan [Lorazepam] Other (See Comments)    Violent and Mean     Current Outpatient Medications  Medication Sig Dispense Refill  . albuterol (VENTOLIN HFA) 108 (90 Base) MCG/ACT inhaler Inhale 1-2 puffs into the lungs every 6 (six) hours as needed for wheezing or shortness of breath. 18 g 0  . aspirin EC 81 MG EC tablet Take 1 tablet (81 mg total) by mouth daily at 6 (six) AM. Swallow whole. 30 tablet 11  . clonazePAM (KLONOPIN) 0.5 MG tablet Take 0.5 mg by mouth 2 (two) times daily.     . cyclobenzaprine  (FLEXERIL) 10 MG tablet Take 10 mg by mouth 3 (three) times daily.    [name] Kitchen GLIPIZIDE XL 10 MG 24 hr tablet Take 10 mg by mouth 2 (two) times daily.    . IBU 800 MG tablet Take 800 mg by mouth every 8 (eight) hours.    [name] Kitchen lisinopril (ZESTRIL) 10 MG tablet TAKE 1 TABLET BY MOUTH ONCE A DAY. (Patient taking differently: Take 10 mg by mouth daily.) 90 tablet 0  . lovastatin (MEVACOR) 40 MG tablet Take 40 mg by mouth at bedtime.     . Melatonin 10 MG TABS Take 40 mg by mouth at bedtime.    . metFORMIN (GLUCOPHAGE) 1000 MG tablet Take  1,000 mg by mouth 2 (two) times daily.     . methocarbamol (ROBAXIN) 750 MG tablet Take 750 mg by mouth every 8 (eight) hours.    [name] Kitchen morphine (MSIR) 15 MG tablet Take 1 tablet by mouth every 4 (four) hours as needed for pain.    [name] Kitchen omeprazole (PRILOSEC) 20 MG capsule TAKE 1 CAPSULE ONCE DAILY 30 MINUTES BEFORE YOUR FIRST MEAL. 30 capsule 5  . ondansetron (ZOFRAN ODT) 4 MG disintegrating tablet 4mg  ODT q4 hours prn nausea/vomit 12 tablet 0  . ondansetron (ZOFRAN) 8 MG tablet Take 1 tablet (8 mg total) by mouth every 4 (four) hours as needed. 10 tablet 0  . Oxycodone HCl 10 MG TABS Take 1 tablet (10 mg total) by mouth every 4 (four) hours. 25 tablet 0  . oxyCODONE-acetaminophen (PERCOCET) 5-325 MG tablet Take 1 tablet by mouth every 4 (four) hours as needed. 30 tablet 0  . pantoprazole (PROTONIX) 40 MG tablet TAKE 1 TABLET BY MOUTH 30 MINUTES PRIOR TO BREAKFAST. (Patient taking differently: Take 40 mg by mouth daily.) 90 tablet 3  . pioglitazone (ACTOS) 15 MG tablet Take 15 mg by mouth at bedtime.    . predniSONE (DELTASONE) 10 MG tablet Take 2 tablets (20 mg total) by mouth daily. 15 tablet 0  . azithromycin (ZITHROMAX) 250 MG tablet Take 1 tablet (250 mg total) by mouth daily. Take first 2 tablets together, then 1 every day until finished. (Patient not taking: Reported on 02/26/2020) 6 tablet 0  . benzonatate (TESSALON) 100 MG capsule Take 1 capsule (100 mg total) by mouth every 8  (eight) hours. (Patient not taking: Reported on 02/26/2020) 60 capsule 0  . HYDROmorphone (DILAUDID) 4 MG tablet Take 1 tablet (4 mg total) by mouth every 6 (six) hours as needed for severe pain. (Patient not taking: Reported on 02/26/2020) 20 tablet 0   No current facility-administered medications for this visit.    REVIEW OF SYSTEMS:  [X]  denotes positive finding, [ ]  denotes negative finding Cardiac  Comments:  Chest pain or chest pressure:    Shortness of breath upon exertion:    Short of breath when lying flat:    Irregular heart rhythm:        Vascular    Pain in calf, thigh, or hip brought on by ambulation:    Pain in feet at night that wakes you up from your sleep:     Blood clot in your veins:    Leg swelling:         Pulmonary    Oxygen at home:    Productive cough:     Wheezing:         Neurologic    Sudden weakness in arms or legs:     Sudden numbness in arms or legs:     Sudden onset of difficulty speaking or slurred speech:    Temporary loss of vision in one eye:     Problems with dizziness:         Gastrointestinal    Blood in stool:     Vomited blood:         Genitourinary    Burning when urinating:     Blood in urine:        Psychiatric    Major depression:         Hematologic    Bleeding problems:    Problems with blood clotting too easily:        Skin    Rashes or  ulcers:        Constitutional    Fever or chills:      PHYSICAL EXAM: Vitals:   02/26/20 0923  BP: 138/84  Pulse: (!) 118  Resp: 16  Temp: (!) 97.5 F (36.4 C)  TempSrc: Temporal  SpO2: 97%  Weight: 208 lb (94.3 kg)  Height: 6\' 1"  (1.854 m)    GENERAL: The patient is a well-nourished male, in no acute distress. The vital signs are documented above. CARDIAC: There is a regular rate and rhythm.  VASCULAR:  Right femoral pulse palpable Right groin incision healing - small open area that appears superficial at groin crease Dry gangrene right toes 1, 2, and 5 Eschar back  of right calf Right PT palpable Right leg incisions healing        DATA:   None  Assessment/Plan:  60 year old male status post right common femoral endarterectomy with profundoplasty and bovine patch and then a right common femoral to BK pop bypass with vein on 02/04/2020 for CLI with rest pain.  Ultimately he had very advanced ischcemia with ABI 0 at time of bypass.  He has now developed dry gangrene in the right first, second, and fifth toes in addition to this ulcer on the back of his calf.  I recommend a partial toe amps of right toes 1, 2, and 5 as well as debridement of this area in the back of his right calf on Friday.  He does have very brisk Doppler signals at the ankle and a palpable PT pulse and his bypass appears patent.  Rest pain resolved except for where toes now demarcating.  I have instructed that he put Betadine paint on the toes.   Marty Heck, MD [facility] Office: [phone]

## 2020-02-27 ENCOUNTER — Other Ambulatory Visit (HOSPITAL_COMMUNITY)
Admission: RE | Admit: 2020-02-27 | Discharge: 2020-02-27 | Disposition: A | Discharge status: Home / Self Care | Payer: PPO | Source: Ambulatory Visit | Attending: Vascular Surgery | Admitting: Vascular Surgery

## 2020-02-27 DIAGNOSIS — Z01812 Encounter for preprocedural laboratory examination: Secondary | ICD-10-CM | POA: Insufficient documentation

## 2020-02-27 DIAGNOSIS — M1990 Unspecified osteoarthritis, unspecified site: Secondary | ICD-10-CM | POA: Diagnosis not present

## 2020-02-27 DIAGNOSIS — Z79899 Other long term (current) drug therapy: Secondary | ICD-10-CM | POA: Diagnosis not present

## 2020-02-27 DIAGNOSIS — L97212 Non-pressure chronic ulcer of right calf with fat layer exposed: Secondary | ICD-10-CM | POA: Diagnosis present

## 2020-02-27 DIAGNOSIS — Z7984 Long term (current) use of oral hypoglycemic drugs: Secondary | ICD-10-CM | POA: Diagnosis not present

## 2020-02-27 DIAGNOSIS — Z20822 Contact with and (suspected) exposure to covid-19: Secondary | ICD-10-CM | POA: Insufficient documentation

## 2020-02-27 DIAGNOSIS — E1151 Type 2 diabetes mellitus with diabetic peripheral angiopathy without gangrene: Secondary | ICD-10-CM | POA: Diagnosis present

## 2020-02-27 DIAGNOSIS — Z880 Allergy status to penicillin: Secondary | ICD-10-CM | POA: Diagnosis not present

## 2020-02-27 DIAGNOSIS — E1152 Type 2 diabetes mellitus with diabetic peripheral angiopathy with gangrene: Secondary | ICD-10-CM | POA: Diagnosis present

## 2020-02-27 DIAGNOSIS — F419 Anxiety disorder, unspecified: Secondary | ICD-10-CM | POA: Diagnosis not present

## 2020-02-27 DIAGNOSIS — I70232 Atherosclerosis of native arteries of right leg with ulceration of calf: Secondary | ICD-10-CM | POA: Diagnosis not present

## 2020-02-27 DIAGNOSIS — F17210 Nicotine dependence, cigarettes, uncomplicated: Secondary | ICD-10-CM | POA: Diagnosis not present

## 2020-02-27 DIAGNOSIS — M4722 Other spondylosis with radiculopathy, cervical region: Secondary | ICD-10-CM | POA: Diagnosis not present

## 2020-02-27 DIAGNOSIS — E785 Hyperlipidemia, unspecified: Secondary | ICD-10-CM | POA: Diagnosis not present

## 2020-02-27 DIAGNOSIS — Z7952 Long term (current) use of systemic steroids: Secondary | ICD-10-CM | POA: Diagnosis not present

## 2020-02-27 DIAGNOSIS — J45909 Unspecified asthma, uncomplicated: Secondary | ICD-10-CM | POA: Diagnosis present

## 2020-02-27 DIAGNOSIS — I70261 Atherosclerosis of native arteries of extremities with gangrene, right leg: Secondary | ICD-10-CM | POA: Diagnosis not present

## 2020-02-27 DIAGNOSIS — Z825 Family history of asthma and other chronic lower respiratory diseases: Secondary | ICD-10-CM | POA: Diagnosis not present

## 2020-02-27 DIAGNOSIS — Z87442 Personal history of urinary calculi: Secondary | ICD-10-CM | POA: Diagnosis not present

## 2020-02-27 DIAGNOSIS — T8189XA Other complications of procedures, not elsewhere classified, initial encounter: Secondary | ICD-10-CM | POA: Diagnosis not present

## 2020-02-27 DIAGNOSIS — E1142 Type 2 diabetes mellitus with diabetic polyneuropathy: Secondary | ICD-10-CM | POA: Diagnosis not present

## 2020-02-27 DIAGNOSIS — I10 Essential (primary) hypertension: Secondary | ICD-10-CM | POA: Diagnosis present

## 2020-02-27 DIAGNOSIS — I70221 Atherosclerosis of native arteries of extremities with rest pain, right leg: Secondary | ICD-10-CM | POA: Diagnosis not present

## 2020-02-27 DIAGNOSIS — Z8042 Family history of malignant neoplasm of prostate: Secondary | ICD-10-CM | POA: Diagnosis not present

## 2020-02-27 DIAGNOSIS — Z79891 Long term (current) use of opiate analgesic: Secondary | ICD-10-CM | POA: Diagnosis not present

## 2020-02-27 DIAGNOSIS — G4700 Insomnia, unspecified: Secondary | ICD-10-CM | POA: Diagnosis present

## 2020-02-27 DIAGNOSIS — Z8249 Family history of ischemic heart disease and other diseases of the circulatory system: Secondary | ICD-10-CM | POA: Diagnosis not present

## 2020-02-27 DIAGNOSIS — I739 Peripheral vascular disease, unspecified: Secondary | ICD-10-CM | POA: Diagnosis not present

## 2020-02-27 DIAGNOSIS — E11622 Type 2 diabetes mellitus with other skin ulcer: Secondary | ICD-10-CM | POA: Diagnosis not present

## 2020-02-27 DIAGNOSIS — Z888 Allergy status to other drugs, medicaments and biological substances status: Secondary | ICD-10-CM | POA: Diagnosis not present

## 2020-02-27 DIAGNOSIS — Z7982 Long term (current) use of aspirin: Secondary | ICD-10-CM | POA: Diagnosis not present

## 2020-02-27 LAB — SARS CORONAVIRUS 2 (TAT 6-24 HRS): SARS Coronavirus 2: NEGATIVE

## 2020-02-28 ENCOUNTER — Encounter (HOSPITAL_COMMUNITY): Payer: Self-pay | Admitting: Vascular Surgery

## 2020-02-28 NOTE — Progress Notes (Addendum)
Mr. Siefert gave me permission to speak with his wife Hilda Blades, I encouraged both to sign a consent in the Admitting office that gives [place] permission to speak with his wife. Patient tested negative for Covid_2/16/22_ and has been in quarantine since that time. Mr. Moody denies shortness of chest pain.  Mr.aFagg has type II diabetes. I instructed Hilda Blades to have patient not take Glycipide tonight and no medications for diabetes in am.  I instructed patient to check CBG after awaking and every 2 hours until arrival  to the hospital.  I Instructed to have  Patient check CBG.  If CBG is less than 70 to drink 1/2 cup of a clear juice. Recheck CBG in 15 minutes, if CBG not raising,  call pre- op desk at [phone] for further instructions.

## 2020-02-29 ENCOUNTER — Inpatient Hospital Stay (HOSPITAL_COMMUNITY)
Admission: RE | Admit: 2020-02-29 | Discharge: 2020-03-03 | DRG: 256 | Disposition: A | Discharge status: Home Health | Payer: PPO | Attending: Vascular Surgery | Admitting: Vascular Surgery

## 2020-02-29 ENCOUNTER — Inpatient Hospital Stay (HOSPITAL_COMMUNITY): Payer: PPO | Admitting: Anesthesiology

## 2020-02-29 ENCOUNTER — Encounter (HOSPITAL_COMMUNITY): Payer: Self-pay | Admitting: Vascular Surgery

## 2020-02-29 ENCOUNTER — Other Ambulatory Visit: Payer: Self-pay

## 2020-02-29 ENCOUNTER — Encounter (HOSPITAL_COMMUNITY)
Admission: RE | Disposition: A | Discharge status: Home Health | Payer: Self-pay | Source: Home / Self Care | Attending: Vascular Surgery

## 2020-02-29 DIAGNOSIS — E785 Hyperlipidemia, unspecified: Secondary | ICD-10-CM | POA: Diagnosis present

## 2020-02-29 DIAGNOSIS — I70232 Atherosclerosis of native arteries of right leg with ulceration of calf: Secondary | ICD-10-CM

## 2020-02-29 DIAGNOSIS — I70261 Atherosclerosis of native arteries of extremities with gangrene, right leg: Secondary | ICD-10-CM | POA: Diagnosis present

## 2020-02-29 DIAGNOSIS — M1990 Unspecified osteoarthritis, unspecified site: Secondary | ICD-10-CM | POA: Diagnosis not present

## 2020-02-29 DIAGNOSIS — Z8042 Family history of malignant neoplasm of prostate: Secondary | ICD-10-CM

## 2020-02-29 DIAGNOSIS — J45909 Unspecified asthma, uncomplicated: Secondary | ICD-10-CM | POA: Diagnosis not present

## 2020-02-29 DIAGNOSIS — Z888 Allergy status to other drugs, medicaments and biological substances status: Secondary | ICD-10-CM | POA: Diagnosis not present

## 2020-02-29 DIAGNOSIS — Z7982 Long term (current) use of aspirin: Secondary | ICD-10-CM

## 2020-02-29 DIAGNOSIS — Z20822 Contact with and (suspected) exposure to covid-19: Secondary | ICD-10-CM | POA: Diagnosis not present

## 2020-02-29 DIAGNOSIS — F419 Anxiety disorder, unspecified: Secondary | ICD-10-CM | POA: Diagnosis present

## 2020-02-29 DIAGNOSIS — E11622 Type 2 diabetes mellitus with other skin ulcer: Secondary | ICD-10-CM | POA: Diagnosis present

## 2020-02-29 DIAGNOSIS — Z87442 Personal history of urinary calculi: Secondary | ICD-10-CM

## 2020-02-29 DIAGNOSIS — G4700 Insomnia, unspecified: Secondary | ICD-10-CM | POA: Diagnosis not present

## 2020-02-29 DIAGNOSIS — F17210 Nicotine dependence, cigarettes, uncomplicated: Secondary | ICD-10-CM | POA: Diagnosis not present

## 2020-02-29 DIAGNOSIS — Z79899 Other long term (current) drug therapy: Secondary | ICD-10-CM | POA: Diagnosis not present

## 2020-02-29 DIAGNOSIS — Z7952 Long term (current) use of systemic steroids: Secondary | ICD-10-CM | POA: Diagnosis not present

## 2020-02-29 DIAGNOSIS — I96 Gangrene, not elsewhere classified: Secondary | ICD-10-CM | POA: Diagnosis present

## 2020-02-29 DIAGNOSIS — I10 Essential (primary) hypertension: Secondary | ICD-10-CM | POA: Diagnosis not present

## 2020-02-29 DIAGNOSIS — I70229 Atherosclerosis of native arteries of extremities with rest pain, unspecified extremity: Secondary | ICD-10-CM

## 2020-02-29 DIAGNOSIS — Z7984 Long term (current) use of oral hypoglycemic drugs: Secondary | ICD-10-CM

## 2020-02-29 DIAGNOSIS — E1152 Type 2 diabetes mellitus with diabetic peripheral angiopathy with gangrene: Secondary | ICD-10-CM | POA: Diagnosis not present

## 2020-02-29 DIAGNOSIS — Z880 Allergy status to penicillin: Secondary | ICD-10-CM | POA: Diagnosis not present

## 2020-02-29 DIAGNOSIS — E1151 Type 2 diabetes mellitus with diabetic peripheral angiopathy without gangrene: Secondary | ICD-10-CM | POA: Diagnosis present

## 2020-02-29 DIAGNOSIS — Z825 Family history of asthma and other chronic lower respiratory diseases: Secondary | ICD-10-CM | POA: Diagnosis not present

## 2020-02-29 DIAGNOSIS — L97212 Non-pressure chronic ulcer of right calf with fat layer exposed: Secondary | ICD-10-CM | POA: Diagnosis present

## 2020-02-29 DIAGNOSIS — Z8249 Family history of ischemic heart disease and other diseases of the circulatory system: Secondary | ICD-10-CM

## 2020-02-29 HISTORY — PX: AMPUTATION: SHX166

## 2020-02-29 HISTORY — PX: APPLICATION OF WOUND VAC: SHX5189

## 2020-02-29 HISTORY — PX: WOUND DEBRIDEMENT: SHX247

## 2020-02-29 LAB — POCT I-STAT, CHEM 8
BUN: 27 mg/dL — ABNORMAL HIGH (ref 6–20)
Calcium, Ion: 1.2 mmol/L (ref 1.15–1.40)
Chloride: 97 mmol/L — ABNORMAL LOW (ref 98–111)
Creatinine, Ser: 0.9 mg/dL (ref 0.61–1.24)
Glucose, Bld: 238 mg/dL — ABNORMAL HIGH (ref 70–99)
HCT: 33 % — ABNORMAL LOW (ref 39.0–52.0)
Hemoglobin: 11.2 g/dL — ABNORMAL LOW (ref 13.0–17.0)
Potassium: 4.3 mmol/L (ref 3.5–5.1)
Sodium: 137 mmol/L (ref 135–145)
TCO2: 27 mmol/L (ref 22–32)

## 2020-02-29 LAB — GLUCOSE, CAPILLARY
Glucose-Capillary: 229 mg/dL — ABNORMAL HIGH (ref 70–99)
Glucose-Capillary: 230 mg/dL — ABNORMAL HIGH (ref 70–99)
Glucose-Capillary: 215 mg/dL — ABNORMAL HIGH (ref 70–99)
Glucose-Capillary: 237 mg/dL — ABNORMAL HIGH (ref 70–99)
Glucose-Capillary: 234 mg/dL — ABNORMAL HIGH (ref 70–99)

## 2020-02-29 LAB — CREATININE, SERUM
Creatinine, Ser: 0.96 mg/dL (ref 0.61–1.24)
GFR, Estimated: >60 mL/min (ref >60)

## 2020-02-29 LAB — CBC
HCT: 36.5 % — ABNORMAL LOW (ref 39.0–52.0)
Hemoglobin: 12 g/dL — ABNORMAL LOW (ref 13.0–17.0)
MCH: 31.1 pg (ref 26.0–34.0)
MCHC: 32.9 g/dL (ref 30.0–36.0)
MCV: 94.6 fL (ref 80.0–100.0)
Platelets: 323 10*3/uL (ref 150–400)
RBC: 3.86 MIL/uL — ABNORMAL LOW (ref 4.22–5.81)
RDW: 14.4 % (ref 11.5–15.5)
WBC: 16.6 10*3/uL — ABNORMAL HIGH (ref 4.0–10.5)
nRBC: 0 % (ref 0.0–0.2)

## 2020-02-29 LAB — HIV ANTIBODY (ROUTINE TESTING W REFLEX): HIV Screen 4th Generation wRfx: NONREACTIVE

## 2020-02-29 SURGERY — AMPUTATION DIGIT
Anesthesia: Regional | Site: Leg Lower | Laterality: Right

## 2020-02-29 MED ORDER — ACETAMINOPHEN 160 MG/5ML PO SOLN: 325.0000 mg | Freq: Once | ORAL | Status: DC | PRN

## 2020-02-29 MED ORDER — PROPOFOL 10 MG/ML IV BOLUS
INTRAVENOUS | Status: AC
  Filled 2020-02-29: qty 20

## 2020-02-29 MED ORDER — ACETAMINOPHEN 325 MG PO TABS: 325.0000 mg | ORAL_TABLET | Freq: Once | ORAL | Status: DC | PRN

## 2020-02-29 MED ORDER — POTASSIUM CHLORIDE CRYS ER 20 MEQ PO TBCR: 20.0000 meq | EXTENDED_RELEASE_TABLET | Freq: Once | ORAL | Status: DC

## 2020-02-29 MED ORDER — MIDAZOLAM HCL 2 MG/2ML IJ SOLN
INTRAMUSCULAR | Status: DC | PRN
  Administered 2020-02-29 (×2): 1 mg via INTRAVENOUS

## 2020-02-29 MED ORDER — FENTANYL CITRATE (PF) 100 MCG/2ML IJ SOLN
INTRAMUSCULAR | Status: AC
  Filled 2020-02-29: qty 2

## 2020-02-29 MED ORDER — LACTATED RINGERS IV SOLN: INTRAVENOUS | Status: DC

## 2020-02-29 MED ORDER — FENTANYL CITRATE (PF) 250 MCG/5ML IJ SOLN
INTRAMUSCULAR | Status: AC
  Filled 2020-02-29: qty 5

## 2020-02-29 MED ORDER — LISINOPRIL 10 MG PO TABS
10.0000 mg | ORAL_TABLET | Freq: Every day | ORAL | Status: DC
  Administered 2020-03-01 – 2020-03-03 (×3): 10 mg via ORAL
  Filled 2020-02-29 (×3): qty 1

## 2020-02-29 MED ORDER — CLONAZEPAM 0.5 MG PO TABS
0.5000 mg | ORAL_TABLET | Freq: Two times a day (BID) | ORAL | Status: DC
  Administered 2020-02-29 – 2020-03-03 (×6): 0.5 mg via ORAL
  Filled 2020-02-29 (×6): qty 1

## 2020-02-29 MED ORDER — MIDAZOLAM HCL 2 MG/2ML IJ SOLN
INTRAMUSCULAR | Status: AC
  Filled 2020-02-29: qty 2

## 2020-02-29 MED ORDER — CHLORHEXIDINE GLUCONATE 4 % EX LIQD: 60.0000 mL | Freq: Once | CUTANEOUS | Status: DC

## 2020-02-29 MED ORDER — ACETAMINOPHEN 650 MG RE SUPP
325.0000 mg | RECTAL | Status: DC | PRN
  Filled 2020-02-29: qty 1

## 2020-02-29 MED ORDER — VASOPRESSIN 20 UNIT/ML IV SOLN
INTRAVENOUS | Status: AC
  Filled 2020-02-29: qty 1

## 2020-02-29 MED ORDER — ASPIRIN EC 81 MG PO TBEC
81.0000 mg | DELAYED_RELEASE_TABLET | Freq: Every day | ORAL | Status: DC
  Administered 2020-03-01 – 2020-03-03 (×3): 81 mg via ORAL
  Filled 2020-02-29 (×3): qty 1

## 2020-02-29 MED ORDER — SODIUM CHLORIDE 0.9 % IV SOLN: INTRAVENOUS | Status: DC

## 2020-02-29 MED ORDER — GUAIFENESIN-DM 100-10 MG/5ML PO SYRP
15.0000 mL | ORAL_SOLUTION | ORAL | Status: DC | PRN
Start: 2020-02-29 — End: 2020-03-03

## 2020-02-29 MED ORDER — LABETALOL HCL 5 MG/ML IV SOLN: 10.0000 mg | INTRAVENOUS | Status: DC | PRN

## 2020-02-29 MED ORDER — PROPOFOL 500 MG/50ML IV EMUL
INTRAVENOUS | Status: DC | PRN
  Administered 2020-02-29: 100 ug/kg/min via INTRAVENOUS

## 2020-02-29 MED ORDER — PRAVASTATIN SODIUM 40 MG PO TABS
40.0000 mg | ORAL_TABLET | Freq: Every day | ORAL | Status: DC
  Administered 2020-02-29 – 2020-03-03 (×4): 40 mg via ORAL
  Filled 2020-02-29 (×4): qty 1

## 2020-02-29 MED ORDER — METOPROLOL TARTRATE 5 MG/5ML IV SOLN: 2.0000 mg | INTRAVENOUS | Status: DC | PRN

## 2020-02-29 MED ORDER — LACTATED RINGERS IV SOLN: INTRAVENOUS | Status: DC | PRN

## 2020-02-29 MED ORDER — HEPARIN SODIUM (PORCINE) 5000 UNIT/ML IJ SOLN
5000.0000 [IU] | Freq: Three times a day (TID) | INTRAMUSCULAR | Status: DC
  Administered 2020-02-29 – 2020-03-03 (×10): 5000 [IU] via SUBCUTANEOUS
  Filled 2020-02-29 (×9): qty 1

## 2020-02-29 MED ORDER — PHENOL 1.4 % MT LIQD: 1.0000 | OROMUCOSAL | Status: DC | PRN

## 2020-02-29 MED ORDER — FENTANYL CITRATE (PF) 250 MCG/5ML IJ SOLN
INTRAMUSCULAR | Status: DC | PRN
  Administered 2020-02-29 (×2): 50 ug via INTRAVENOUS

## 2020-02-29 MED ORDER — IBUPROFEN 400 MG PO TABS
800.0000 mg | ORAL_TABLET | Freq: Three times a day (TID) | ORAL | Status: DC | PRN
  Administered 2020-03-01 – 2020-03-02 (×3): 800 mg via ORAL
  Filled 2020-02-29 (×3): qty 2

## 2020-02-29 MED ORDER — ONDANSETRON HCL 4 MG/2ML IJ SOLN: 4.0000 mg | Freq: Four times a day (QID) | INTRAMUSCULAR | Status: DC | PRN

## 2020-02-29 MED ORDER — ONDANSETRON HCL 4 MG/2ML IJ SOLN
INTRAMUSCULAR | Status: DC | PRN
  Administered 2020-02-29: 4 mg via INTRAVENOUS

## 2020-02-29 MED ORDER — PIOGLITAZONE HCL 15 MG PO TABS
15.0000 mg | ORAL_TABLET | Freq: Every day | ORAL | Status: DC
  Administered 2020-02-29 – 2020-03-02 (×3): 15 mg via ORAL
  Filled 2020-02-29 (×4): qty 1

## 2020-02-29 MED ORDER — FENTANYL CITRATE (PF) 100 MCG/2ML IJ SOLN
25.0000 ug | INTRAMUSCULAR | Status: DC | PRN
Start: 2020-02-29 — End: 2020-02-29
  Administered 2020-02-29 (×4): 25 ug via INTRAVENOUS

## 2020-02-29 MED ORDER — CHLORHEXIDINE GLUCONATE 0.12 % MT SOLN
15.0000 mL | Freq: Once | OROMUCOSAL | Status: AC
  Administered 2020-02-29: 15 mL via OROMUCOSAL
  Filled 2020-02-29: qty 15

## 2020-02-29 MED ORDER — VASOPRESSIN 20 UNIT/ML IV SOLN
INTRAVENOUS | Status: DC | PRN
  Administered 2020-02-29 (×2): 2 [IU] via INTRAVENOUS

## 2020-02-29 MED ORDER — MEPERIDINE HCL 25 MG/ML IJ SOLN: 6.2500 mg | INTRAMUSCULAR | Status: DC | PRN

## 2020-02-29 MED ORDER — PHENYLEPHRINE 40 MCG/ML (10ML) SYRINGE FOR IV PUSH (FOR BLOOD PRESSURE SUPPORT)
PREFILLED_SYRINGE | INTRAVENOUS | Status: DC | PRN
  Administered 2020-02-29: 80 ug via INTRAVENOUS
  Administered 2020-02-29 (×2): 160 ug via INTRAVENOUS

## 2020-02-29 MED ORDER — MORPHINE SULFATE (PF) 2 MG/ML IV SOLN
2.0000 mg | INTRAVENOUS | Status: DC | PRN
  Administered 2020-02-29 – 2020-03-03 (×14): 2 mg via INTRAVENOUS
  Filled 2020-02-29 (×14): qty 1

## 2020-02-29 MED ORDER — PHENYLEPHRINE HCL-NACL 10-0.9 MG/250ML-% IV SOLN
INTRAVENOUS | Status: DC | PRN
  Administered 2020-02-29: 25 ug/min via INTRAVENOUS

## 2020-02-29 MED ORDER — BUPIVACAINE-EPINEPHRINE (PF) 0.5% -1:200000 IJ SOLN
INTRAMUSCULAR | Status: DC | PRN
  Administered 2020-02-29: 30 mL via PERINEURAL

## 2020-02-29 MED ORDER — PANTOPRAZOLE SODIUM 40 MG PO TBEC
40.0000 mg | DELAYED_RELEASE_TABLET | Freq: Every day | ORAL | Status: DC
  Administered 2020-03-01 – 2020-03-03 (×3): 40 mg via ORAL
  Filled 2020-02-29 (×3): qty 1

## 2020-02-29 MED ORDER — 0.9 % SODIUM CHLORIDE (POUR BTL) OPTIME
TOPICAL | Status: DC | PRN
  Administered 2020-02-29: 1000 mL

## 2020-02-29 MED ORDER — ACETAMINOPHEN 10 MG/ML IV SOLN: 1000.0000 mg | Freq: Once | INTRAVENOUS | Status: DC | PRN

## 2020-02-29 MED ORDER — DEXMEDETOMIDINE (PRECEDEX) IN NS 20 MCG/5ML (4 MCG/ML) IV SYRINGE
PREFILLED_SYRINGE | INTRAVENOUS | Status: DC | PRN
  Administered 2020-02-29 (×3): 4 ug via INTRAVENOUS

## 2020-02-29 MED ORDER — ORAL CARE MOUTH RINSE: 15.0000 mL | Freq: Once | OROMUCOSAL | Status: AC

## 2020-02-29 MED ORDER — VANCOMYCIN HCL IN DEXTROSE 1-5 GM/200ML-% IV SOLN
1000.0000 mg | INTRAVENOUS | Status: AC
  Administered 2020-02-29: 1000 mg via INTRAVENOUS
  Filled 2020-02-29: qty 200

## 2020-02-29 MED ORDER — PHENYLEPHRINE 40 MCG/ML (10ML) SYRINGE FOR IV PUSH (FOR BLOOD PRESSURE SUPPORT)
PREFILLED_SYRINGE | INTRAVENOUS | Status: AC
  Filled 2020-02-29: qty 10

## 2020-02-29 MED ORDER — CYCLOBENZAPRINE HCL 10 MG PO TABS
10.0000 mg | ORAL_TABLET | Freq: Three times a day (TID) | ORAL | Status: DC
  Administered 2020-02-29 – 2020-03-03 (×10): 10 mg via ORAL
  Filled 2020-02-29 (×10): qty 1

## 2020-02-29 MED ORDER — POLYETHYLENE GLYCOL 3350 17 G PO PACK: 17.0000 g | PACK | Freq: Every day | ORAL | Status: DC | PRN

## 2020-02-29 MED ORDER — GLIPIZIDE ER 10 MG PO TB24
10.0000 mg | ORAL_TABLET | Freq: Two times a day (BID) | ORAL | Status: DC
  Administered 2020-02-29 – 2020-03-03 (×7): 10 mg via ORAL
  Filled 2020-02-29 (×8): qty 1

## 2020-02-29 MED ORDER — OXYCODONE-ACETAMINOPHEN 5-325 MG PO TABS
1.0000 | ORAL_TABLET | ORAL | Status: DC | PRN
Start: 2020-02-29 — End: 2020-03-03
  Administered 2020-03-01 – 2020-03-03 (×6): 1 via ORAL
  Filled 2020-02-29 (×6): qty 1

## 2020-02-29 MED ORDER — ACETAMINOPHEN 325 MG PO TABS
325.0000 mg | ORAL_TABLET | ORAL | Status: DC | PRN
  Administered 2020-02-29 – 2020-03-01 (×3): 650 mg via ORAL
  Filled 2020-02-29 (×3): qty 2

## 2020-02-29 MED ORDER — AMISULPRIDE (ANTIEMETIC) 5 MG/2ML IV SOLN: 10.0000 mg | Freq: Once | INTRAVENOUS | Status: DC | PRN

## 2020-02-29 MED ORDER — INSULIN ASPART 100 UNIT/ML ~~LOC~~ SOLN
0.0000 [IU] | Freq: Three times a day (TID) | SUBCUTANEOUS | Status: DC
  Administered 2020-02-29: 5 [IU] via SUBCUTANEOUS
  Administered 2020-03-01: 3 [IU] via SUBCUTANEOUS
  Administered 2020-03-01: 2 [IU] via SUBCUTANEOUS
  Administered 2020-03-02: 3 [IU] via SUBCUTANEOUS
  Administered 2020-03-02: 11 [IU] via SUBCUTANEOUS
  Administered 2020-03-03: 2 [IU] via SUBCUTANEOUS
  Administered 2020-03-03: 3 [IU] via SUBCUTANEOUS

## 2020-02-29 MED ORDER — HYDROMORPHONE HCL 2 MG PO TABS
4.0000 mg | ORAL_TABLET | Freq: Four times a day (QID) | ORAL | Status: DC | PRN
  Administered 2020-03-01: 4 mg via ORAL
  Filled 2020-02-29 (×2): qty 2

## 2020-02-29 MED ORDER — ALUM & MAG HYDROXIDE-SIMETH 200-200-20 MG/5ML PO SUSP: 15.0000 mL | ORAL | Status: DC | PRN

## 2020-02-29 MED ORDER — METFORMIN HCL 500 MG PO TABS
1000.0000 mg | ORAL_TABLET | Freq: Two times a day (BID) | ORAL | Status: DC
  Administered 2020-02-29 – 2020-03-03 (×6): 1000 mg via ORAL
  Filled 2020-02-29 (×6): qty 2

## 2020-02-29 MED ORDER — BISACODYL 10 MG RE SUPP: 10.0000 mg | Freq: Every day | RECTAL | Status: DC | PRN

## 2020-02-29 MED ORDER — VANCOMYCIN HCL 1000 MG/200ML IV SOLN
1000.0000 mg | Freq: Two times a day (BID) | INTRAVENOUS | Status: AC
  Administered 2020-02-29 – 2020-03-01 (×2): 1000 mg via INTRAVENOUS
  Filled 2020-02-29 (×2): qty 200

## 2020-02-29 MED ORDER — ALBUTEROL SULFATE HFA 108 (90 BASE) MCG/ACT IN AERS
1.0000 | INHALATION_SPRAY | Freq: Four times a day (QID) | RESPIRATORY_TRACT | Status: DC | PRN
Start: 2020-02-29 — End: 2020-03-03
  Filled 2020-02-29: qty 6.7

## 2020-02-29 MED ORDER — PROPOFOL 10 MG/ML IV BOLUS
INTRAVENOUS | Status: DC | PRN
  Administered 2020-02-29: 20 mg via INTRAVENOUS

## 2020-02-29 MED ORDER — HYDRALAZINE HCL 20 MG/ML IJ SOLN: 5.0000 mg | INTRAMUSCULAR | Status: DC | PRN

## 2020-02-29 SURGICAL SUPPLY — 35 items
BNDG ELASTIC 4X5.8 VLCR STR LF (GAUZE/BANDAGES/DRESSINGS) ×3 IMPLANT
BNDG GAUZE ELAST 4 BULKY (GAUZE/BANDAGES/DRESSINGS) ×3 IMPLANT
CANISTER SUCT 3000ML PPV (MISCELLANEOUS) ×3 IMPLANT
CANISTER WOUNDNEG PRESSURE 500 (CANNISTER) ×1 IMPLANT
COVER LIGHT HANDLE UNIVERSAL (MISCELLANEOUS) ×3 IMPLANT
COVER SURGICAL LIGHT HANDLE (MISCELLANEOUS) ×3 IMPLANT
COVER WAND RF STERILE (DRAPES) ×3 IMPLANT
DRAPE EXTREMITY T 121X128X90 (DISPOSABLE) ×3 IMPLANT
DRAPE HALF SHEET 40X57 (DRAPES) ×3 IMPLANT
DRSG EMULSION OIL 3X3 NADH (GAUZE/BANDAGES/DRESSINGS) ×4 IMPLANT
DRSG VAC ATS SM SENSATRAC (GAUZE/BANDAGES/DRESSINGS) ×1 IMPLANT
ELECT REM PT RETURN 9FT ADLT (ELECTROSURGICAL) ×3
ELECTRODE REM PT RTRN 9FT ADLT (ELECTROSURGICAL) ×2 IMPLANT
GAUZE SPONGE 4X4 12PLY STRL (GAUZE/BANDAGES/DRESSINGS) ×3 IMPLANT
GLOVE BIO SURGEON STRL SZ7.5 (GLOVE) ×3 IMPLANT
GLOVE SRG 8 PF TXTR STRL LF DI (GLOVE) ×2 IMPLANT
GLOVE SURG LTX SZ6.5 (GLOVE) ×2 IMPLANT
GLOVE SURG UNDER POLY LF SZ6.5 (GLOVE) ×3 IMPLANT
GLOVE SURG UNDER POLY LF SZ8 (GLOVE) ×3
GOWN STRL REUS W/ TWL LRG LVL3 (GOWN DISPOSABLE) ×4 IMPLANT
GOWN STRL REUS W/ TWL XL LVL3 (GOWN DISPOSABLE) ×4 IMPLANT
GOWN STRL REUS W/TWL LRG LVL3 (GOWN DISPOSABLE) ×6
GOWN STRL REUS W/TWL XL LVL3 (GOWN DISPOSABLE) ×6
KIT BASIN OR (CUSTOM PROCEDURE TRAY) ×3 IMPLANT
KIT TURNOVER KIT B (KITS) ×3 IMPLANT
NS IRRIG 1000ML POUR BTL (IV SOLUTION) ×3 IMPLANT
PACK GENERAL/GYN (CUSTOM PROCEDURE TRAY) ×3 IMPLANT
PAD ARMBOARD 7.5X6 YLW CONV (MISCELLANEOUS) ×6 IMPLANT
SUT ETHILON 3 0 PS 1 (SUTURE) ×5 IMPLANT
SUT VIC AB 3-0 SH 27 (SUTURE)
SUT VIC AB 3-0 SH 27X BRD (SUTURE) IMPLANT
TAPE CLOTH SURG 4X10 WHT LF (GAUZE/BANDAGES/DRESSINGS) ×1 IMPLANT
TOWEL GREEN STERILE (TOWEL DISPOSABLE) ×6 IMPLANT
UNDERPAD 30X36 HEAVY ABSORB (UNDERPADS AND DIAPERS) ×3 IMPLANT
WATER STERILE IRR 1000ML POUR (IV SOLUTION) ×3 IMPLANT

## 2020-02-29 NOTE — Anesthesia Postprocedure Evaluation (Signed)
Anesthesia Post Note  Patient: Wayne Pratt  Procedure(s) Performed: RIGHT TOE AMPUTATIONS, 1, 2, 5 (Right ) RIGHT CALF DEBRIDEMENT (Right ) APPLICATION OF WOUND VAC (Right Leg Lower)     Patient location during evaluation: PACU Anesthesia Type: Regional Level of consciousness: awake and alert Pain management: pain level controlled Vital Signs Assessment: post-procedure vital signs reviewed and stable Respiratory status: spontaneous breathing, nonlabored ventilation, respiratory function stable and patient connected to nasal cannula oxygen Cardiovascular status: stable and blood pressure returned to baseline Postop Assessment: no apparent nausea or vomiting Anesthetic complications: no   No complications documented.  Last Vitals:  Vitals:   02/29/20 1035 02/29/20 1201  BP: 103/71 103/75  Pulse: 97 100  Resp: 13 11  Temp: 36.8 C 36.7 C  SpO2: 97% 98%    Last Pain:  Vitals:   02/29/20 1201  TempSrc: Oral  PainSc:                  Effie Berkshire

## 2020-02-29 NOTE — Progress Notes (Signed)
Orthopedic Tech Progress Note Patient Details:  Wayne Pratt Jan 30, 1960 [id number]  Ortho Devices Type of Ortho Device: Darco shoe Ortho Device/Splint Location: RLE Ortho Device/Splint Interventions: Ordered,Application,Adjustment   Post Interventions Patient Tolerated: Well Instructions Provided: Care of device   [name] 02/29/2020, 2:01 PM

## 2020-02-29 NOTE — Op Note (Signed)
Date: February 29, 2020  Preoperative diagnosis: Necrotic right toes 1, 2, and 5 following recent revascularization including necrotic right calf wound  Postoperative diagnosis: Same  Procedure: 1. Partial toe amputations of right toes 1, 2, and 5 2. Sharp excisional debridement of right calf wound with negative pressure wound VAC placement (4 cm x 6 cm)  Surgeon: Dr. Marty Heck, MD  Assistant: Leontine Locket, PA  Indication: Patient is a 60 year old male who was recently seen in the office with critical limb ischemia of the right lower extremity with severe rest pain and an ABI of zero. He recent underwent right common femoral endarterectomy with profundoplasty and a right common femoral to below-knee popliteal bypass with me on 02/04/20. He has a palpable PT pulse at the ankle now. On follow-up in the office he was noted to have gangrene of the right toes one two and five with a eschar on the back of his right calf. He presents today for toe amputations and calf debridement. Assistant was needed to expedite the case and to assist with holding the leg.  Findings: Partial toe amputations of right toes one, two and five with good bleeding. The right calf wound had a large eschar that was also debrided sharply and measured 4 cm x 6 cm with healthy appearing fat underneath it and negative pressure wound VAC was applied.  Anesthesia: Regional block with MAC  Details: Patient was taken to the operating room after informed consent was obtained. Placed on the operative table supine position. He had already received a regional block. Once anesthesia was induced with MAC his right foot and calf were prepped in standard sterile fashion. Timeout was performed to identify patient, procedure, and site.  Ultimately I started by marking out a fishmouth incision at the base of the right first, second, and fifth toes. 15 blade scalpel was used to carry fishmouth incision down through subcutaneous tissue  and then the phalanges was transected with a bone cutter and then I used a rongeur to take the bone back to take tension off the wound.  All these were then irrigated out and closed loosely with horizontal mattress using a 3-0 nylon. I then turned my attention to the right calf where 15 blade scalpel was used for sharp excisional debridement of this wound on the back of the calf that measured about 4 cm x 6 cm at completion. Ultimately we debrided down to healthy appearing subcutaneous fat that was bleeding. Small VAC sponge was then cut to the size of the wound and attached with a track pad after we had good seal. This was set at -125 mmHg. Ultimate dry sterile dressings were applied to the foot and taken to PACU in stable condition.  Complication: None  Condition: Stable  Marty Heck, MD [facility] Office: [place]

## 2020-02-29 NOTE — Progress Notes (Signed)
Pt received to room alert and oriented, surgical sites assessed in presence of OR nurse.  Telemetry applied, CCMD notified, and vitals taken.  CHG bath complete, orders reviewed and released.  Pt and wife oriented to room and equipment  Care plan for day reviewed.  Will con't plan of care.

## 2020-02-29 NOTE — Anesthesia Procedure Notes (Signed)
Anesthesia Regional Block: Popliteal block   Pre-Anesthetic Checklist: ,, timeout performed, Correct Patient, Correct Site, Correct Laterality, Correct Procedure, Correct Position, site marked, Risks and benefits discussed,  Surgical consent,  Pre-op evaluation,  At surgeon's request and post-op pain management  Laterality: Right  Prep: chloraprep       Needles:  Injection technique: Single-shot  Needle Type: Echogenic Stimulator Needle     Needle Length: 9cm  Needle Gauge: 21     Additional Needles:   Procedures:,,,, ultrasound used (permanent image in chart),,,,  Narrative:  Start time: 02/29/2020 7:15 AM End time: 02/29/2020 7:20 AM Injection made incrementally with aspirations every 5 mL.  Performed by: Personally  Anesthesiologist: Effie Berkshire, MD  Additional Notes: Patient tolerated the procedure well. Local anesthetic introduced in an incremental fashion under minimal resistance after negative aspirations. No paresthesias were elicited. After completion of the procedure, no acute issues were identified and patient continued to be monitored by RN.

## 2020-02-29 NOTE — Progress Notes (Signed)
Mobility Specialist: Progress Note   02/29/20 1524  Mobility  Activity Dangled on edge of bed  Level of Assistance Independent  Assistive Device None  Mobility Response Tolerated well  Mobility performed by Mobility specialist  $Mobility charge 1 Mobility   See previous note.   [facility] Doxie Augenstein Mobility Specialist Mobility Specialist Phone: [phone]

## 2020-02-29 NOTE — Progress Notes (Addendum)
Mobility Specialist: Progress Note   Pre-Mobility: 106 HR, 99% SpO2 Post-Mobility: 94 HR, 97% SpO2  Pt c/o pain in R foot he rated 6/10. Pt back to bed with call bell at his side.   [facility] [name] Mobility Specialist Mobility Specialist Phone: [phone]

## 2020-02-29 NOTE — TOC Initial Note (Signed)
Transition of Care [facility]) - Initial/Assessment Note    Patient Details  Name: Wayne Pratt MRN: [id number] Date of Birth: [birth year]  Transition of Care [facility]) CM/SW Contact:    Marilu Favre, RN Phone Number: 02/29/2020, 12:30 PM  Clinical Narrative:                 Patient from home with wife Hilda Blades. NCM spoke to [place] and patient at bedside. He is active with [facility].   NCM explained NCM will order VAC through [facility] will submit to his insurance company for coverage. If approved [employer] will deliver home VAC to patient's hospital room prior to discharge with a box of dressing supplies.    Home VAC smaller and more portable then hospital VAC.   HHRN will change VAC dressing at home.   Cory with [facility] aware.   [employer] form faxed to [place] with [employer].   Expected Discharge Plan: [place] Barriers to Discharge: Continued Medical Work up   Patient Goals and CMS Choice Patient states their goals for this hospitalization and ongoing recovery are:: to return home CMS Medicare.gov Compare Post Acute Care list provided to:: Patient Choice offered to / list presented to : Patient  Expected Discharge Plan and Services Expected Discharge Plan: [place]   Discharge Planning Services: CM Consult Post Acute Care Choice: [place] arrangements for the past 2 months: Single Family Home                 DME Arranged: Vac DME Agency: [employer] Date DME Agency Contacted: 02/29/20 Time DME Agency Contacted: 1228 Representative spoke with at DME Agency: [place]: RN [place] Agency: [place] Date [place]: 02/29/20 Time [place]: 1229 Representative spoke with at [place]: [place] Arrangements/Services Living arrangements for the past 2 months: [place] Lives with:: Spouse Patient language and need for interpreter reviewed:: Yes Do you feel safe going back to the place where  you live?: Yes      Need for Family Participation in Patient Care: Yes (Comment) Care giver support system in place?: Yes (comment)   Criminal Activity/Legal Involvement Pertinent to Current Situation/Hospitalization: No - Comment as needed  Activities of Daily Living Home Assistive Devices/Equipment: None ADL Screening (condition at time of admission) Patient's cognitive ability adequate to safely complete daily activities?: Yes Is the patient deaf or have difficulty hearing?: No Does the patient have difficulty seeing, even when wearing glasses/contacts?: No Does the patient have difficulty concentrating, remembering, or making decisions?: No Patient able to express need for assistance with ADLs?: Yes Does the patient have difficulty dressing or bathing?: No Independently performs ADLs?: Yes (appropriate for developmental age) Does the patient have difficulty walking or climbing stairs?: No Weakness of Legs: Right Weakness of Arms/Hands: None  Permission Sought/Granted   Permission granted to share information with : Yes, Verbal Permission Granted  Share Information with NAME: Hilda Blades wife  Permission granted to share info w AGENCY: [name]        Emotional Assessment Appearance:: Appears stated age Attitude/Demeanor/Rapport: Engaged Affect (typically observed): Accepting Orientation: : Oriented to Self,Oriented to Place,Oriented to  Time,Oriented to Situation Alcohol / Substance Use: Not Applicable Psych Involvement: No (comment)  Admission diagnosis:  Gangrene of toe [facility]) [I96] Patient Active Problem List   Diagnosis Date Noted  . Gangrene of toe ([place]) 02/29/2020  . Ischemic rest pain of lower extremity 02/04/2020  .  Critical lower limb ischemia ([place]) 01/29/2020  . Hypertension 01/25/2020  . Crushing injury of finger 05/15/2019  . Neuropraxia of upper extremity, left, initial encounter 05/15/2019  . Abdominal pain, epigastric 06/20/2017  . Dilation of pancreatic duct  06/20/2017  . S/P insertion of spinal cord stimulator 08/01/2015  . Rectal bleeding 07/17/2014  . Anemia 07/17/2014  . Fatty liver 09/07/2011  . Diabetes mellitus ([place]) 09/07/2011  . Encounter for screening colonoscopy 09/07/2011  . Chronic pain 09/07/2011   PCP:  Leeanne Rio, MD Pharmacy:   [facility], [address] [place] [address] Phone: [phone] Fax: [phone]     Social Determinants of Health (SDOH) Interventions    Readmission Risk Interventions Readmission Risk Prevention Plan 02/07/2020  Transportation Screening Complete  PCP or Specialist Appt within 5-7 Days Complete  Home Care Screening Complete  Medication Review (RN CM) Complete  Some recent data might be hidden

## 2020-02-29 NOTE — H&P (Signed)
History and Physical Interval Note:  02/29/2020 7:37 AM  Wayne Pratt  has presented today for surgery, with the diagnosis of CRITICAL LOWER LIMB ISCHEMIA.  The various methods of treatment have been discussed with the patient and family. After consideration of risks, benefits and other options for treatment, the patient has consented to  Procedure(s): RIGHT TOE AMPUTATIONS, 1, 2, 5 (Right) RIGHT CALF DEBRIDEMENT (Right) as a surgical intervention.  The patient's history has been reviewed, patient examined, no change in status, stable for surgery.  I have reviewed the patient's chart and labs.  Questions were answered to the patient's satisfaction.     Marty Heck  Patient name: Wayne Pratt   MRN: [id number]        DOB: Feb 28, 1960        Sex: male  REASON FOR VISIT: Post-op check after right leg bypass for CLI  HPI: Wayne Pratt is a 59 y.o. male with multiple medical problems including DM, HTN, HLD who presents for postop check after right lower extremity bypass.  Ultimately he underwent right common femoral endarterectomy with profundoplasty and bovine patch with a right common femoral to below-knee pop bypass with saphenous vein on 02/04/2020 for CLI with rest pain.  At the time of his bypass had advanced ischemia with ABIs that were 0.  He states that his right first, second, and fifth toes have turned black.  The pain in his foot is much better other than the tissue loss on the toes.  He also has an area on the back of his right calf that has been very painful.      Past Medical History:  Diagnosis Date  . Anemia    takes Iron pill daily  . Anxiety    takes Xanax daily  . Arthritis    Patient denies  . Asthma   . Cancer ([place])    skin   . Diabetes mellitus ([place])    takes Metformin daily  . Headache    migraine  - not lately  . History of bronchitis    mid 90's  . History of hypertension    lost weight and been off meds over a yr  . History of kidney  stones    Passed 32  . History of pleural empyema   . Hypercholesterolemia    lost weight and been off meds over a yr  . Hypertension   . Insomnia    takes Trazodone nightly  . Kidney stones    22 times  . Neuropathy   . Pneumonia    hx of-15+ yrs ago  . Sleep apnea    Stop Bang score of 7  . Weakness    numbness and tingling         Past Surgical History:  Procedure Laterality Date  . ABDOMINAL AORTOGRAM W/LOWER EXTREMITY N/A 01/31/2020   Procedure: ABDOMINAL AORTOGRAM W/LOWER EXTREMITY;  Surgeon: Marty Heck, MD;  Location: [place] [facility];  Service: Cardiovascular;  Laterality: N/A;  . BIOPSY  10/05/2011   Dr. Oneida Alar :non-erosive gastritis  . BIOPSY  08/06/2014   Procedure: BIOPSY;  Surgeon: Danie Binder, MD;  Location: [facility];  Service: Endoscopy;;  . CERVICAL DISC SURGERY    . ELBOW SURGERY Right   . ENDARTERECTOMY FEMORAL Right 02/04/2020   Procedure: RIGHT FEMORAL ENDARTERECTOMY with Profundaplasty;  Surgeon: Marty Heck, MD;  Location: [place];  Service: Vascular;  Laterality: Right;  . ESOPHAGOGASTRODUODENOSCOPY  Sept 2013  Dr. Oneida Alar: chronic inactive gastritis   . ESOPHAGOGASTRODUODENOSCOPY N/A 08/18/2017   Procedure: ESOPHAGOGASTRODUODENOSCOPY (EGD);  Surgeon: Milus Banister, MD;  Location: [facility];  Service: Endoscopy;  Laterality: N/A;  . ESOPHAGOGASTRODUODENOSCOPY N/A 03/09/2018   Procedure: ESOPHAGOGASTRODUODENOSCOPY (EGD);  Surgeon: Milus Banister, MD;  Location: [facility];  Service: Endoscopy;  Laterality: N/A;  . ESOPHAGOGASTRODUODENOSCOPY (EGD) WITH PROPOFOL N/A 08/06/2014   mild non-erosive gastritis, duodenitis  . EUS N/A 08/18/2017   Procedure: UPPER ENDOSCOPIC ULTRASOUND (EUS) RADIAL;  Surgeon: Milus Banister, MD;  Location: [facility];  Service: Endoscopy;  Laterality: N/A;  . EUS N/A 03/09/2018   Procedure: UPPER ENDOSCOPIC ULTRASOUND (EUS) RADIAL;  Surgeon: Milus Banister, MD;   Location: [facility];  Service: Endoscopy;  Laterality: N/A;  . FEMORAL-POPLITEAL BYPASS GRAFT Right 02/04/2020   Procedure: BYPASS GRAFT FEMORAL-POPLITEAL ARTERY With Vein Harvest;  Surgeon: Marty Heck, MD;  Location: [place];  Service: Vascular;  Laterality: Right;  . FINE NEEDLE ASPIRATION  03/09/2018   Procedure: FINE NEEDLE ASPIRATION;  Surgeon: Milus Banister, MD;  Location: [facility];  Service: Endoscopy;;  . FLEXIBLE SIGMOIDOSCOPY  10/05/2011   Dr. Fields:poor prep  . LUNG SURGERY  2010   for empyema  . PATCH ANGIOPLASTY Right 02/04/2020   Procedure: PATCH ANGIOPLASTY;  Surgeon: Marty Heck, MD;  Location: [place];  Service: Vascular;  Laterality: Right;  . SPINAL CORD STIMULATOR INSERTION N/A 08/01/2015   Procedure: Spinal Cord Stimulator Implant;  Surgeon: Eustace Moore, MD;  Location: [facility];  Service: Neurosurgery;  Laterality: N/A;  . TONSILLECTOMY    . VASECTOMY Bilateral 1995  . VEIN HARVEST Right 02/04/2020   Procedure: VEIN HARVEST Greater Saphenous Vein;  Surgeon: Marty Heck, MD;  [facility];  Service: Vascular;  Laterality: Right;         Family History  Problem Relation Age of Onset  . Prostate cancer Other        all male members on mother's side  . Heart attack Father   . COPD Maternal Grandmother   . Cancer Paternal Grandmother   . Colon cancer Neg Hx     SOCIAL HISTORY: Social History        Tobacco Use  . Smoking status: Current Every Day Smoker    Packs/day: 1.00    Years: 39.00    Pack years: 39.00    Types: Cigarettes  . Smokeless tobacco: Never Used  Substance Use Topics  . Alcohol use: No    Alcohol/week: 0.0 standard drinks         Allergies  Allergen Reactions  . Penicillins Anaphylaxis and Other (See Comments)    Has patient had a PCN reaction causing immediate rash, facial/tongue/throat swelling, SOB or lightheadedness with hypotension: Yes Has patient had a  PCN reaction causing severe rash involving mucus membranes or skin necrosis: Yes Has patient had a PCN reaction that required hospitalization: No Has patient had a PCN reaction occurring within the last 10 years: No If all of the above answers are "NO", then may proceed with Cephalosporin use.   . Ativan [Lorazepam] Other (See Comments)    Violent and Mean           Current Outpatient Medications  Medication Sig Dispense Refill  . albuterol (VENTOLIN HFA) 108 (90 Base) MCG/ACT inhaler Inhale 1-2 puffs into the lungs every 6 (six) hours as needed for wheezing or shortness of breath. 18 g 0  . aspirin EC 81 MG EC  tablet Take 1 tablet (81 mg total) by mouth daily at 6 (six) AM. Swallow whole. 30 tablet 11  . clonazePAM (KLONOPIN) 0.5 MG tablet Take 0.5 mg by mouth 2 (two) times daily.     . cyclobenzaprine (FLEXERIL) 10 MG tablet Take 10 mg by mouth 3 (three) times daily.    [name] Kitchen GLIPIZIDE XL 10 MG 24 hr tablet Take 10 mg by mouth 2 (two) times daily.    . IBU 800 MG tablet Take 800 mg by mouth every 8 (eight) hours.    [name] Kitchen lisinopril (ZESTRIL) 10 MG tablet TAKE 1 TABLET BY MOUTH ONCE A DAY. (Patient taking differently: Take 10 mg by mouth daily.) 90 tablet 0  . lovastatin (MEVACOR) 40 MG tablet Take 40 mg by mouth at bedtime.     . Melatonin 10 MG TABS Take 40 mg by mouth at bedtime.    . metFORMIN (GLUCOPHAGE) 1000 MG tablet Take 1,000 mg by mouth 2 (two) times daily.     . methocarbamol (ROBAXIN) 750 MG tablet Take 750 mg by mouth every 8 (eight) hours.    [name] Kitchen morphine (MSIR) 15 MG tablet Take 1 tablet by mouth every 4 (four) hours as needed for pain.    [name] Kitchen omeprazole (PRILOSEC) 20 MG capsule TAKE 1 CAPSULE ONCE DAILY 30 MINUTES BEFORE YOUR FIRST MEAL. 30 capsule 5  . ondansetron (ZOFRAN ODT) 4 MG disintegrating tablet 4mg  ODT q4 hours prn nausea/vomit 12 tablet 0  . ondansetron (ZOFRAN) 8 MG tablet Take 1 tablet (8 mg total) by mouth every 4 (four) hours as needed. 10 tablet  0  . Oxycodone HCl 10 MG TABS Take 1 tablet (10 mg total) by mouth every 4 (four) hours. 25 tablet 0  . oxyCODONE-acetaminophen (PERCOCET) 5-325 MG tablet Take 1 tablet by mouth every 4 (four) hours as needed. 30 tablet 0  . pantoprazole (PROTONIX) 40 MG tablet TAKE 1 TABLET BY MOUTH 30 MINUTES PRIOR TO BREAKFAST. (Patient taking differently: Take 40 mg by mouth daily.) 90 tablet 3  . pioglitazone (ACTOS) 15 MG tablet Take 15 mg by mouth at bedtime.    . predniSONE (DELTASONE) 10 MG tablet Take 2 tablets (20 mg total) by mouth daily. 15 tablet 0  . azithromycin (ZITHROMAX) 250 MG tablet Take 1 tablet (250 mg total) by mouth daily. Take first 2 tablets together, then 1 every day until finished. (Patient not taking: Reported on 02/26/2020) 6 tablet 0  . benzonatate (TESSALON) 100 MG capsule Take 1 capsule (100 mg total) by mouth every 8 (eight) hours. (Patient not taking: Reported on 02/26/2020) 60 capsule 0  . HYDROmorphone (DILAUDID) 4 MG tablet Take 1 tablet (4 mg total) by mouth every 6 (six) hours as needed for severe pain. (Patient not taking: Reported on 02/26/2020) 20 tablet 0   No current facility-administered medications for this visit.    REVIEW OF SYSTEMS:  [X]  denotes positive finding, [ ]  denotes negative finding Cardiac  Comments:  Chest pain or chest pressure:    Shortness of breath upon exertion:    Short of breath when lying flat:    Irregular heart rhythm:        Vascular    Pain in calf, thigh, or hip brought on by ambulation:    Pain in feet at night that wakes you up from your sleep:     Blood clot in your veins:    Leg swelling:         Pulmonary    Oxygen at  home:    Productive cough:     Wheezing:         Neurologic    Sudden weakness in arms or legs:     Sudden numbness in arms or legs:     Sudden onset of difficulty speaking or slurred speech:    Temporary loss of vision in one eye:     Problems with  dizziness:         Gastrointestinal    Blood in stool:     Vomited blood:         Genitourinary    Burning when urinating:     Blood in urine:        Psychiatric    Major depression:         Hematologic    Bleeding problems:    Problems with blood clotting too easily:        Skin    Rashes or ulcers:        Constitutional    Fever or chills:      PHYSICAL EXAM:    Vitals:   02/26/20 0923  BP: 138/84  Pulse: (!) 118  Resp: 16  Temp: (!) 97.5 F (36.4 C)  TempSrc: Temporal  SpO2: 97%  Weight: 208 lb (94.3 kg)  Height: 6\' 1"  (1.854 m)    GENERAL: The patient is a well-nourished male, in no acute distress. The vital signs are documented above. CARDIAC: There is a regular rate and rhythm.  VASCULAR:  Right femoral pulse palpable Right groin incision healing - small open area that appears superficial at groin crease Dry gangrene right toes 1, 2, and 5 Eschar back of right calf Right PT palpable Right leg incisions healing        DATA:   None  Assessment/Plan:  60 year old male status post right common femoral endarterectomy with profundoplasty and bovine patch and then a right common femoral to BK pop bypass with vein on 02/04/2020 for CLI with rest pain.  Ultimately he had very advanced ischcemia with ABI 0 at time of bypass.  He has now developed dry gangrene in the right first, second, and fifth toes in addition to this ulcer on the back of his calf.  I recommend a partial toe amps of right toes 1, 2, and 5 as well as debridement of this area in the back of his right calf on Friday.  He does have very brisk Doppler signals at the ankle and a palpable PT pulse and his bypass appears patent.  Rest pain resolved except for where toes now demarcating.  I have instructed that he put Betadine paint on the toes.   Marty Heck, MD [facility] [place] Office:  [phone]

## 2020-02-29 NOTE — Anesthesia Preprocedure Evaluation (Addendum)
Anesthesia Evaluation  Patient identified by MRN, date of birth, ID band Patient awake    Reviewed: Allergy & Precautions, NPO status , Patient's Chart, lab work & pertinent test results  Airway Mallampati: I  TM Distance: >3 FB Neck ROM: Full    Dental  (+) Edentulous Upper, Edentulous Lower   Pulmonary asthma , sleep apnea , former smoker,    breath sounds clear to auscultation       Cardiovascular hypertension, Pt. on medications + Peripheral Vascular Disease   Rhythm:Regular Rate:Normal     Neuro/Psych  Headaches, Anxiety  Neuromuscular disease    GI/Hepatic negative GI ROS, Neg liver ROS,   Endo/Other  diabetes, Type 2, Oral Hypoglycemic Agents  Renal/GU      Musculoskeletal  (+) Arthritis ,   Abdominal Normal abdominal exam  (+)   Peds  Hematology negative hematology ROS (+)   Anesthesia Other Findings   Reproductive/Obstetrics                            Anesthesia Physical Anesthesia Plan  ASA: III  Anesthesia Plan: Regional   Post-op Pain Management:    Induction: Intravenous  PONV Risk Score and Plan: 2 and Ondansetron, Propofol infusion and Midazolam  Airway Management Planned: Natural Airway and Simple Face Mask  Additional Equipment: None  Intra-op Plan:   Post-operative Plan:   Informed Consent: I have reviewed the patients History and Physical, chart, labs and discussed the procedure including the risks, benefits and alternatives for the proposed anesthesia with the patient or authorized representative who has indicated his/her understanding and acceptance.       Plan Discussed with: CRNA  Anesthesia Plan Comments: (Lab Results      Component                Value               Date                      WBC                      15.6 (H)            02/05/2020                HGB                      11.2 (L)            02/29/2020                HCT                       33.0 (L)            02/29/2020                MCV                      91.0                02/05/2020                PLT                      403 (H)  02/05/2020           )       Anesthesia Quick Evaluation

## 2020-02-29 NOTE — Transfer of Care (Signed)
Immediate Anesthesia Transfer of Care Note  Patient: Wayne Pratt  Procedure(s) Performed: RIGHT TOE AMPUTATIONS, 1, 2, 5 (Right ) RIGHT CALF DEBRIDEMENT (Right ) APPLICATION OF WOUND VAC (Right Leg Lower)  Patient Location: PACU  Anesthesia Type:MAC and Regional  Level of Consciousness: awake and alert   Airway & Oxygen Therapy: Patient Spontanous Breathing and Patient connected to face mask oxygen  Post-op Assessment: Report given to RN and Post -op Vital signs reviewed and stable  Post vital signs: Reviewed and stable  Last Vitals:  Vitals Value Taken Time  BP 123/71 02/29/20 0856  Temp    Pulse 77 02/29/20 0856  Resp 12 02/29/20 0856  SpO2 100 % 02/29/20 0856  Vitals shown include unvalidated device data.  Last Pain:  Vitals:   02/29/20 0630  PainSc: 8       Patients Stated Pain Goal: 5 (94/85/46 2703)  Complications: No complications documented.

## 2020-03-01 ENCOUNTER — Encounter (HOSPITAL_COMMUNITY): Payer: Self-pay | Admitting: Vascular Surgery

## 2020-03-01 LAB — BASIC METABOLIC PANEL
Anion gap: 12 (ref 5–15)
BUN: 21 mg/dL — ABNORMAL HIGH (ref 6–20)
CO2: 27 mmol/L (ref 22–32)
Calcium: 8.5 mg/dL — ABNORMAL LOW (ref 8.9–10.3)
Chloride: 98 mmol/L (ref 98–111)
Creatinine, Ser: 0.9 mg/dL (ref 0.61–1.24)
GFR, Estimated: >60 mL/min (ref >60)
Glucose, Bld: 175 mg/dL — ABNORMAL HIGH (ref 70–99)
Potassium: 4.6 mmol/L (ref 3.5–5.1)
Sodium: 137 mmol/L (ref 135–145)

## 2020-03-01 LAB — GLUCOSE, CAPILLARY
Glucose-Capillary: 173 mg/dL — ABNORMAL HIGH (ref 70–99)
Glucose-Capillary: 143 mg/dL — ABNORMAL HIGH (ref 70–99)
Glucose-Capillary: 100 mg/dL — ABNORMAL HIGH (ref 70–99)
Glucose-Capillary: 242 mg/dL — ABNORMAL HIGH (ref 70–99)

## 2020-03-01 LAB — HEMOGLOBIN A1C
Hgb A1c MFr Bld: 9.6 % — ABNORMAL HIGH (ref 4.8–5.6)
Mean Plasma Glucose: 228.82 mg/dL

## 2020-03-01 LAB — CBC
HCT: 30.3 % — ABNORMAL LOW (ref 39.0–52.0)
Hemoglobin: 10 g/dL — ABNORMAL LOW (ref 13.0–17.0)
MCH: 31.6 pg (ref 26.0–34.0)
MCHC: 33 g/dL (ref 30.0–36.0)
MCV: 95.9 fL (ref 80.0–100.0)
Platelets: 258 10*3/uL (ref 150–400)
RBC: 3.16 MIL/uL — ABNORMAL LOW (ref 4.22–5.81)
RDW: 14.5 % (ref 11.5–15.5)
WBC: 9.8 10*3/uL (ref 4.0–10.5)
nRBC: 0 % (ref 0.0–0.2)

## 2020-03-01 NOTE — Evaluation (Signed)
Physical Therapy Evaluation Patient Details Name: Wayne Pratt MRN: [id number] DOB: 03-Oct-1960 Today's Date: 03/01/2020   History of Present Illness  Wayne Pratt is a 60 y.o. male, with history of hypertension, hyperlipidemia, DM, cx surgery, spinal cord stimlulator, and tobacco abuse with RLE pain due to PAD that was limiting his function. Underwent R femoral endarterecomy, R FPBG 02/04/20. He now presnts with right toe ambutation on 02/29/2020  Clinical Impression  Patient did well with treatment. He was abel to ambulate to the bathroom and back keeping heel weight bearing in [place]. He had no increase in pain. He will likely not require follow up services following admission. Acute therapy will continue to work with him to increase gait distance.     Follow Up Recommendations No PT follow up    Equipment Recommendations  None recommended by PT    Recommendations for Other Services       Precautions / Restrictions Precautions Precautions: None Restrictions Weight Bearing Restrictions: Yes RUE Weight Bearing:  (heel weight bearing) RLE Weight Bearing: Weight bearing as tolerated      Mobility  Bed Mobility Overal bed mobility: Independent             General bed mobility comments: able to sit at the edge of the bed without difficulty.    Transfers Overall transfer level: Needs assistance   Transfers: Sit to/from Stand Sit to Stand: Min guard         General transfer comment: able to stand using darko boot with min guard for intial balance.  Ambulation/Gait Ambulation/Gait assistance: Min assist Gait Distance (Feet): 20 Feet Assistive device: Rolling walker (2 wheeled) Gait Pattern/deviations: Step-to pattern Gait velocity: decreased   General Gait Details: Heel weight bearing using Darko shoe. Noincrease in pain. Able to walk to restroom and stand to use the restroom, then walk back  Stairs            Wheelchair Mobility    Modified Rankin  (Stroke Patients Only)       Balance Overall balance assessment: Needs assistance Sitting-balance support: No upper extremity supported;Feet supported;Feet unsupported Sitting balance-Leahy Scale: Good     Standing balance support: Bilateral upper extremity supported Standing balance-Leahy Scale: Poor                               Pertinent Vitals/Pain Pain Assessment: Faces Faces Pain Scale: Hurts a little bit Pain Descriptors / Indicators: Aching Pain Intervention(s): Monitored during session;Limited activity within patient's tolerance    Home Living Family/patient expects to be discharged to:: Private residence Living Arrangements: Spouse/significant other Available Help at Discharge: Family;Available PRN/intermittently Type of Home: House Home Access: Stairs to enter     Home Layout: Two level Home Equipment: Shower seat;Hand held Tourist information centre manager - 2 wheels      Prior Function Level of Independence: Independent         Comments: was functioning normally until 3 mos ago per pt and wife     Hand Dominance   Dominant Hand: Right    Extremity/Trunk Assessment   Upper Extremity Assessment Upper Extremity Assessment: Defer to OT evaluation    Lower Extremity Assessment Lower Extremity Assessment: RLE deficits/detail RLE Deficits / Details: right foot in a bandage RLE: Unable to fully assess due to pain       Communication   Communication: No difficulties  Cognition Arousal/Alertness: Awake/alert Behavior During Therapy: WFL for tasks assessed/performed Overall  Cognitive Status: Within Functional Limits for tasks assessed                                        General Comments General comments (skin integrity, edema, etc.): right foot bandaged with wound vac    Exercises     Assessment/Plan    PT Assessment Patient needs continued PT services  PT Problem List Decreased strength;Decreased range of motion;Decreased  activity tolerance;Decreased mobility;Decreased coordination       PT Treatment Interventions DME instruction;Gait training;Stair training;Functional mobility training;Therapeutic activities;Therapeutic exercise;Neuromuscular re-education;Patient/family education    PT Goals (Current goals can be found in the Care Plan section)  Acute Rehab PT Goals Patient Stated Goal: to go home PT Goal Formulation: With patient Time For Goal Achievement: 03/08/20 Potential to Achieve Goals: Good    Frequency Min 2X/week   Barriers to discharge        Co-evaluation               AM-PAC PT "6 Clicks" Mobility  Outcome Measure Help needed turning from your back to your side while in a flat bed without using bedrails?: A Little Help needed moving from lying on your back to sitting on the side of a flat bed without using bedrails?: A Little Help needed moving to and from a bed to a chair (including a wheelchair)?: A Little Help needed standing up from a chair using your arms (e.g., wheelchair or bedside chair)?: A Little Help needed to walk in hospital room?: A Little Help needed climbing 3-5 steps with a railing? : A Little 6 Click Score: 18    End of Session Equipment Utilized During Treatment: Gait belt Activity Tolerance: Patient tolerated treatment well Patient left: with call bell/phone within reach;in bed (patient requested to get back to bed)   PT Visit Diagnosis: Unsteadiness on feet (R26.81);Repeated falls (R29.6);Muscle weakness (generalized) (M62.81)    Time: 2162-4469 PT Time Calculation (min) (ACUTE ONLY): 21 min   Charges:   PT Evaluation $PT Eval Moderate Complexity: 1 Mod         Carney Living PT DPT  03/01/2020, 3:03 PM

## 2020-03-01 NOTE — Progress Notes (Signed)
   ASSESSMENT & PLAN:  Wayne Pratt is a 60 y.o. male status post right partial toe amputations (1,2, and 5) and sharp debridement of right calf wound (4 x 6 x 2cm)   Ready for DC once arrangements can be made for home VAC  SUBJECTIVE:  No complaints. Foot is a bit sore.  OBJECTIVE:  BP 122/87 (BP Location: Left Arm)   Pulse 78   Temp 98.1 F (36.7 C) (Oral)   Resp 12   Ht 6\' 1"  (1.854 m)   Wt 95.3 kg   SpO2 99%   BMI 27.71 kg/m   Intake/Output Summary (Last 24 hours) at 03/01/2020 0949 Last data filed at 03/01/2020 0912 Gross per 24 hour  Intake 1285.88 ml  Output 850 ml  Net 435.88 ml   Clean dressing to foot VAC with good seal  CBC Latest Ref Rng & Units 03/01/2020 02/29/2020 02/29/2020  WBC 4.0 - 10.5 K/uL 9.8 16.6(H) -  Hemoglobin 13.0 - 17.0 g/dL 10.0(L) 12.0(L) 11.2(L)  Hematocrit 39.0 - 52.0 % 30.3(L) 36.5(L) 33.0(L)  Platelets 150 - 400 K/uL 258 323 -     CMP Latest Ref Rng & Units 03/01/2020 02/29/2020 02/29/2020  Glucose 70 - 99 mg/dL 175(H) - 238(H)  BUN 6 - 20 mg/dL 21(H) - 27(H)  Creatinine 0.61 - 1.24 mg/dL 0.90 0.96 0.90  Sodium 135 - 145 mmol/L 137 - 137  Potassium 3.5 - 5.1 mmol/L 4.6 - 4.3  Chloride 98 - 111 mmol/L 98 - 97(L)  CO2 22 - 32 mmol/L 27 - -  Calcium 8.9 - 10.3 mg/dL 8.5(L) - -  Total Protein 6.5 - 8.1 g/dL - - -  Total Bilirubin 0.3 - 1.2 mg/dL - - -  Alkaline Phos 38 - 126 U/L - - -  AST 15 - 41 U/L - - -  ALT 0 - 44 U/L - - -    Estimated Creatinine Clearance: 98.6 mL/min (by C-G formula based on SCr of 0.9 mg/dL).  Yevonne Aline. Stanford Breed, MD [facility] Phone Number: [phone] 03/01/2020 9:49 AM

## 2020-03-02 LAB — GLUCOSE, CAPILLARY
Glucose-Capillary: 171 mg/dL — ABNORMAL HIGH (ref 70–99)
Glucose-Capillary: 110 mg/dL — ABNORMAL HIGH (ref 70–99)
Glucose-Capillary: 307 mg/dL — ABNORMAL HIGH (ref 70–99)
Glucose-Capillary: 153 mg/dL — ABNORMAL HIGH (ref 70–99)

## 2020-03-02 NOTE — Progress Notes (Addendum)
  Progress Note    03/02/2020 8:50 AM 2 Days Post-Op  Subjective:  States that he had a rough night. Right foot pain kept him awake aching all night. Does report that he did well with therapy yesterday   Vitals:   03/02/20 0247 03/02/20 0758  BP: 110/88 136/78  Pulse: 98   Resp: 12   Temp: 98.7 F (37.1 C) 98.8 F (37.1 C)  SpO2: 95%    Physical Exam: Cardiac:  regular Lungs: non labored Incisions:  Right 1st, 2nd, and 5th toe amputation sites well appearing. Dressings reapplied. Wound VAC to suction on posterior right leg. 100 cc serosanguinous output. RLE incisions healing well. Keep dry gauze in right groin to wick moisture  Right foot 1st, 2nd toe amputations and 5th toe partial amputation sites  Extremities: RLE is well perfused and warm Neurologic: alert and oriented  CBC    Component Value Date/Time   WBC 9.8 03/01/2020 0222   RBC 3.16 (L) 03/01/2020 0222   HGB 10.0 (L) 03/01/2020 0222   HCT 30.3 (L) 03/01/2020 0222   PLT 258 03/01/2020 0222   MCV 95.9 03/01/2020 0222   MCH 31.6 03/01/2020 0222   MCHC 33.0 03/01/2020 0222   RDW 14.5 03/01/2020 0222   LYMPHSABS 3.2 01/18/2019 1618   MONOABS 0.7 01/18/2019 1618   EOSABS 0.2 01/18/2019 1618   BASOSABS 0.0 01/18/2019 1618    BMET    Component Value Date/Time   NA 137 03/01/2020 0222   K 4.6 03/01/2020 0222   CL 98 03/01/2020 0222   CO2 27 03/01/2020 0222   GLUCOSE 175 (H) 03/01/2020 0222   BUN 21 (H) 03/01/2020 0222   CREATININE 0.90 03/01/2020 0222   CREATININE 1.02 04/26/2011 1450   CALCIUM 8.5 (L) 03/01/2020 0222   GFRNONAA >60 03/01/2020 0222   GFRAA >60 01/18/2019 1618    INR    Component Value Date/Time   INR 1.1 01/31/2020 1403     Intake/Output Summary (Last 24 hours) at 03/02/2020 0850 Last data filed at 03/02/2020 9628 Gross per 24 hour  Intake 1613.66 ml  Output 1600 ml  Net 13.66 ml     Assessment/Plan:  60 y.o. male is s/p post right partial toe amputations (1,2, and 5) and  sharp debridement of right calf wound (4 x 6 x 2cm)  2 Days Post-Op.Doing well post op. Some increased right foot pain over night. Dressings changed and toe amputations sites look good. Wound VAC in place. Ready for DC once arrangements for home [facility] are made. He will have follow up in 4 weeks  DVT prophylaxis:  Sq heparin   Karoline Caldwell, PA-C [facility] [phone] 03/02/2020 8:50 AM   VASCULAR STAFF ADDENDUM: I agree with the above.   Yevonne Aline. Stanford Breed, MD [facility] Phone Number: [phone] 03/02/2020 9:02 AM

## 2020-03-02 NOTE — Progress Notes (Signed)
Mobility Specialist - Progress Note   03/02/20 1149  Mobility  Activity Ambulated in room  Level of Assistance Standby assist, set-up cues, supervision of patient - no hands on  Assistive Device Front wheel walker  Distance Ambulated (ft) 20 ft  Mobility Response Tolerated well  Mobility performed by Mobility specialist  $Mobility charge 1 Mobility   Pre-mobility: 85 HR, 97% SpO2 During mobility: 107 HR Post-mobility: 84 HR, 98% SpO2  Pt states he has R LE since his dressing change that he rates a 9/10. He states it didn't worsen w/ ambulation. Distance limited by pt feeling tired from lack of sleep. Pt to bed after walk to try to take a nap.   Pricilla Handler Mobility Specialist Mobility Specialist Phone: [phone]

## 2020-03-03 LAB — GLUCOSE, CAPILLARY
Glucose-Capillary: 132 mg/dL — ABNORMAL HIGH (ref 70–99)
Glucose-Capillary: 154 mg/dL — ABNORMAL HIGH (ref 70–99)
Glucose-Capillary: 206 mg/dL — ABNORMAL HIGH (ref 70–99)

## 2020-03-03 NOTE — Progress Notes (Signed)
Mobility Specialist - Progress Note   03/03/20 1408  Mobility  Activity Ambulated in room  Level of Assistance Standby assist, set-up cues, supervision of patient - no hands on  Assistive Device Front wheel walker  Distance Ambulated (ft) 72 ft (24 ft x 1, 48 ft x 1)  Mobility Response Tolerated fair  Mobility performed by Mobility specialist  $Mobility charge 1 Mobility   Pre-mobility: 88 HR During mobility: 132 HR Post-mobility: 112 HR  Pt required one seated rest break due to R calf cramping. Pt refusing to go out into hallway until he can wear his clothes. Pt sitting up on edge of bed with wife in room after ambulation.   Pricilla Handler Mobility Specialist Mobility Specialist Phone: [phone]

## 2020-03-03 NOTE — Discharge Instructions (Signed)
[facility] of [facility]  Discharge instructions  Lower Extremity Bypass Surgery  Please refer to the following instruction for your post-procedure care. Your surgeon or physician assistant will discuss any changes with you.  Activity  You are encouraged to walk as much as you can. You can slowly return to normal activities during the month after your surgery. Avoid strenuous activity and heavy lifting until your doctor tells you it's OK. Avoid activities such as vacuuming or swinging a golf club. Do not drive until your doctor give the OK and you are no longer taking prescription pain medications. It is also normal to have difficulty with sleep habits, eating and bowel movement after surgery. These will go away with time.  Bathing/Showering  Shower daily after you go home. Do not soak in a bathtub, hot tub, or swim until the incision heals completely.  Incision Care  Clean your incision with mild soap and water. Shower every day. Pat the area dry with a clean towel. You do not need a bandage unless otherwise instructed. Do not apply any ointments or creams to your incision. If you have open wounds you will be instructed how to care for them or a visiting nurse may be arranged for you. If you have staples or sutures along your incision they will be removed at your post-op appointment. You may have skin glue on your incision. Do not peel it off. It will come off on its own in about one week.  Wash the groin wound with soap and water daily and pat dry. (No tub bath-only shower)  Then put a dry gauze or washcloth in the groin to keep this area dry to help prevent wound infection.  Do this daily and as needed.  Do not use Vaseline or neosporin on your incisions.  Only use soap and water on your incisions and then protect and keep dry. Clean toe amputation sites with mild soap and water and pat dry. Float heals and wear Darco shoe when bearing weight on right foot  Wound VAC  changes to right leg per Home Health RN 3x/ week  Diet  Resume your normal diet. There are no special food restrictions following this procedure. A low fat/ low cholesterol diet is recommended for all patients with vascular disease. In order to heal from your surgery, it is CRITICAL to get adequate nutrition. Your body requires vitamins, minerals, and protein. Vegetables are the best source of vitamins and minerals. Vegetables also provide the perfect balance of protein. Processed food has little nutritional value, so try to avoid this.  Medications  Resume taking all your medications unless your doctor or physician assistant tells you not to. If your incision is causing pain, you may take over-the-counter pain relievers such as acetaminophen (Tylenol). If you were prescribed a stronger pain medication, please aware these medication can cause nausea and constipation. Prevent nausea by taking the medication with a snack or meal. Avoid constipation by drinking plenty of fluids and eating foods with high amount of fiber, such as fruits, vegetables, and grains. Take Colace 100 mg (an over-the-counter stool softener) twice a day as needed for constipation.   Do not take Tylenol if you are taking prescription pain medications.  Follow Up  Our office will schedule a follow up appointment 2-3 weeks following discharge.  Please call us immediately for any of the following conditions  .Severe or worsening pain in your legs or feet while at rest or while walking .Increase pain, redness,  warmth, or drainage (pus) from your incision site(s) . Fever of 101 degree or higher . The swelling in your leg with the bypass suddenly worsens and becomes more painful than when you were in the hospital . If you have been instructed to feel your graft pulse then you should do so every day. If you can no longer feel this pulse, call the office immediately. Not all patients are given this instruction. .  Leg swelling is  common after leg bypass surgery.  The swelling should improve over a few months following surgery. To improve the swelling, you may elevate your legs above the level of your heart while you are sitting or resting. Your surgeon or physician assistant may ask you to apply an ACE wrap or wear compression (TED) stockings to help to reduce swelling.  Reduce your risk of vascular disease  Stop smoking. If you would like help call [facility] at 1-800-QUIT-NOW [phone]) or [place] at (503) 234-1073.  . Manage your cholesterol . Maintain a desired weight . Control your diabetes weight . Control your diabetes . Keep your blood pressure down .  If you have any questions, please call the office at [phone]   Your Diabetes A1c level was high at 9.6%. The goal is to be at a 7% or less (average of 150 all the time when you stick your finger to check your glucose). Watch what you eat and get a referral for nutritional education from your doctor. If your glucose level stays above 180 all the time this is too high please contact your doctor. You may need insulin for a short time until your foot heals.

## 2020-03-03 NOTE — Care Management Important Message (Signed)
Important Message  Patient Details  Name: Wayne Pratt MRN: [id number] Date of Birth: 12-Sep-1960   Medicare Important Message Given:  Yes     Keriana Sarsfield Montine Circle 03/03/2020, 4:34 PM

## 2020-03-03 NOTE — Progress Notes (Signed)
Inpatient Diabetes Program Recommendations  AACE/ADA: New Consensus Statement on Inpatient Glycemic Control (2015)  Target Ranges:  Prepandial:   less than 140 mg/dL      Peak postprandial:   less than 180 mg/dL (1-2 hours)      Critically ill patients:  140 - 180 mg/dL   Lab Results  Component Value Date   GLUCAP 154 (H) 03/03/2020   HGBA1C 9.6 (H) 03/01/2020    Review of Glycemic Control  Diabetes history: DM 2 Outpatient Diabetes medications: Glipizide 10 mg bid, Actos 15 mg qhs, Metformin 1000 mg bid Current orders for Inpatient glycemic control:  Glipizde 10 mg bid, Actos 15 mg Daily, Metformin 1000 mg bid, Novolog 0-15 units tid  A1c 9.6% on 2/19 A1c 9.2% on 1/20  Per nursing pt told wife over phone to order and bring 6 apple pies from [facility] with patient and wife about A1c and diabetes control at home. 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 Wife also reports pt is a picky order and will eat things like chicken nuggets from [employer].  Pt goes to his PCP for DM management. Pt has a hard time trusting doctors per his report.   Pt reports he would eat ice cream at midnight, put milk and sugar in his coffee and rely on tootsie roll suckers due to him recently quitting smoking. I would repeat over and over that the pt does not need to eat anything extra due to wound healing and glucose levels at this time.  Reviewed A1c level. Discussed glucose and A1c goals. Pt has all supplies needed at home for glucose control. Asked pt to check glucose twice a day. Asked pt to follow up with his PCP for glucose management if glucose continues to be elevated. Pt reports feeling like he is having a low blood sugar around 100. Discussed his body has gotten use to his levels being so high.  Expecting poor compliance based on nursing information on habits pt has at home. Wife reporting his toes were not because of his diabetes.   Thanks,  Tama Headings RN, MSN, [facility] Inpatient Diabetes Coordinator Team Pager [phone] (8a-5p)

## 2020-03-03 NOTE — Progress Notes (Signed)
Discharge orders received.  IV and telemetry removed, CCMD notified.  Pt discharge education included f/u appts, home medication regimen, when to call MD and previna wound vac mgt.  Home health to follow. Pt expresses understanding, significant other present for instructions as well.

## 2020-03-03 NOTE — Progress Notes (Addendum)
  Progress Note    03/03/2020 7:37 AM 3 Days Post-Op  Subjective:  Pain R foot and calf   Vitals:   03/02/20 2250 03/03/20 0345  BP: 124/78 120/66  Pulse: 88 78  Resp: 16   Temp: 98.2 F (36.8 C) 98.2 F (36.8 C)  SpO2: 98% 98%   Physical Exam: Lungs:  Non labored Incisions:  R toe amps with viable skin edges, no drainage; vac with good seal R calf Extremities:  Palpable PT pulse Neurologic: A&O  CBC    Component Value Date/Time   WBC 9.8 03/01/2020 0222   RBC 3.16 (L) 03/01/2020 0222   HGB 10.0 (L) 03/01/2020 0222   HCT 30.3 (L) 03/01/2020 0222   PLT 258 03/01/2020 0222   MCV 95.9 03/01/2020 0222   MCH 31.6 03/01/2020 0222   MCHC 33.0 03/01/2020 0222   RDW 14.5 03/01/2020 0222   LYMPHSABS 3.2 01/18/2019 1618   MONOABS 0.7 01/18/2019 1618   EOSABS 0.2 01/18/2019 1618   BASOSABS 0.0 01/18/2019 1618    BMET    Component Value Date/Time   NA 137 03/01/2020 0222   K 4.6 03/01/2020 0222   CL 98 03/01/2020 0222   CO2 27 03/01/2020 0222   GLUCOSE 175 (H) 03/01/2020 0222   BUN 21 (H) 03/01/2020 0222   CREATININE 0.90 03/01/2020 0222   CREATININE 1.02 04/26/2011 1450   CALCIUM 8.5 (L) 03/01/2020 0222   GFRNONAA >60 03/01/2020 0222   GFRAA >60 01/18/2019 1618    INR    Component Value Date/Time   INR 1.1 01/31/2020 1403     Intake/Output Summary (Last 24 hours) at 03/03/2020 0737 Last data filed at 03/03/2020 9166 Gross per 24 hour  Intake 1212.94 ml  Output 950 ml  Net 262.94 ml     Assessment/Plan:  60 y.o. male is s/p R 1,2,5 toe amputations and debridement of calf wound 3 Days Post-Op   R foot well perfused with palpable PTA pulse Toe amp sites appear to be healing well OOB with darco shoe Vac change today R calf Home when home vac delivered to room    Dagoberto Ligas, PA-C [facility] [phone] 03/03/2020 7:37 AM  VASCULAR STAFF ADDENDUM: I agree with the above.   Yevonne Aline. Stanford Breed, MD Vascular and Vein  Specialists of [facility] Phone Number: [phone] 03/03/2020 1:54 PM

## 2020-03-03 NOTE — TOC Progression Note (Addendum)
Transition of Care (TOC) - Progression Note  Marvetta Gibbons RN, BSN Transitions of Care Unit 4E- RN Case Manager See Treatment Team for direct phone #    Patient Details  Name: Wayne Pratt MRN: [id number] Date of Birth: Jan 24, 1960  Transition of Care [facility]) CM/SW Contact  Dahlia Client, Romeo Rabon, RN Phone Number: 03/03/2020, 11:20 AM  Clinical Narrative:    [employer] home wound VAC approval pending- checked with Olivia Mackie at [facility] this am regarding home wound VAC- per conversation wound VAC could not be released over the weekend because patient's insurance requires prior auth and HTA is not open over the weekend to approve- [employer] is reaching out to them again this AM to see if they can get the insurance auth asap today.   1445- received notification from [employer] that home wound VAC has been approved and released for delivery. Trying to get timeframe for delivery so that wife/pt and bedside RN can be informed when to expect home wound VAC to be here at hospital.    Expected Discharge Plan: [place] Barriers to Discharge: Continued Medical Work up  Expected Discharge Plan and Services Expected Discharge Plan: [place]   Discharge Planning Services: CM Consult Post Acute Care Choice: [place] arrangements for the past 2 months: Single Family Home                 DME Arranged: Vac DME Agency: [employer] Date DME Agency Contacted: 02/29/20 Time DME Agency Contacted: 1228 Representative spoke with at DME Agency: Olivia Mackie HH Arranged: RN [facility] Agency: [place] Date [place]: 02/29/20 Time [place]: 1229 Representative spoke with at [place]: [place] ([place]) Interventions    Readmission Risk Interventions Readmission Risk Prevention Plan 02/07/2020  Transportation Screening Complete  PCP or Specialist Appt within 5-7 Days Complete  Home Care Screening Complete  Medication Review (RN CM) Complete   Some recent data might be hidden

## 2020-03-04 NOTE — Discharge Summary (Signed)
Discharge Summary  Patient ID: Wayne Pratt [id number] 59 y.o. November 07, 1960  Admit date: 02/29/2020  Discharge date and time: 03/03/2020  6:20 PM   Admitting Physician: Marty Heck, MD   Discharge Physician: Dr. Stanford Breed  Admission Diagnoses: Gangrene of toe [facility]) [I96]  Discharge Diagnoses: s/p toe amputations  Admission Condition: fair  Discharged Condition: fair  Indication for Admission: Gangrenous tissue changes of toes of right leg with nonhealing right calf wound  Hospital Course: [name] is a 60 year old male who is about 1 month status post right femoral endarterectomy with femoral to below the knee popliteal bypass with vein by Dr. Carlis Abbott.  Several toes on right foot have gangrenous tissue changes.  He also has a nonhealing right calf wound.  He was brought in as an outpatient for amputation of right first, second, and fifth toe as well as debridement of right calf wound with VAC placement.  Throughout his hospital stay he maintained a well-perfused right foot with a palpable PT pulse.  He was fitted for a Darco shoe as he is heel weightbearing only on right foot.  He is under a pain medication contract thus much of his hospital stay involved weaning from IV pain medication.  At the time of discharge patient had arrangements for home health RN to change the vacuum dressing 3 times a week.  He will follow-up with Dr. Carlis Abbott in 2 to 3 weeks for wound check.  He was discharged in stable condition.  Consults: None  Treatments: surgery: Right first, second, and fifth toe amputations and debridement of right calf wound by Dr. Carlis Abbott on 02/29/2020  Discharge Exam: See progress note 03/03/20 Vitals:   03/03/20 0847 03/03/20 1615  BP: 139/63 119/71  Pulse: 70 94  Resp: 18 20  Temp: 97.8 F (36.6 C) 98 F (36.7 C)  SpO2: 100% 97%     Disposition: Discharge disposition: 01-Home or Self Care       Patient Instructions:  Allergies as of 03/03/2020      Reactions    Penicillins Anaphylaxis, Other (See Comments)   Has patient had a PCN reaction causing immediate rash, facial/tongue/throat swelling, SOB or lightheadedness with hypotension: Yes Has patient had a PCN reaction causing severe rash involving mucus membranes or skin necrosis: Yes Has patient had a PCN reaction that required hospitalization: No Has patient had a PCN reaction occurring within the last 10 years: No If all of the above answers are "NO", then may proceed with Cephalosporin use.   Ativan [lorazepam] Other (See Comments)   Violent and Mean       Medication List    STOP taking these medications   Oxycodone HCl 10 MG Tabs     TAKE these medications   acetaminophen 650 MG CR tablet Commonly known as: TYLENOL Take 1,300 mg by mouth every 8 (eight) hours as needed for pain.   albuterol 108 (90 Base) MCG/ACT inhaler Commonly known as: VENTOLIN HFA Inhale 1-2 puffs into the lungs every 6 (six) hours as needed for wheezing or shortness of breath.   aspirin 81 MG EC tablet Take 1 tablet (81 mg total) by mouth daily at 6 (six) AM. Swallow whole.   clonazePAM 0.5 MG tablet Commonly known as: KLONOPIN Take 0.5 mg by mouth 2 (two) times daily.   cyclobenzaprine 10 MG tablet Commonly known as: FLEXERIL Take 10 mg by mouth 3 (three) times daily.   glipiZIDE XL 10 MG 24 hr tablet Generic drug: glipiZIDE Take 10 mg  by mouth 2 (two) times daily.   HYDROmorphone 4 MG tablet Commonly known as: DILAUDID Take 4 mg by mouth every 6 (six) hours as needed for severe pain.   IBU 800 MG tablet Generic drug: ibuprofen Take 800 mg by mouth every 8 (eight) hours as needed for moderate pain.   lisinopril 10 MG tablet Commonly known as: ZESTRIL TAKE 1 TABLET BY MOUTH ONCE A DAY.   lovastatin 40 MG tablet Commonly known as: MEVACOR Take 40 mg by mouth at bedtime.   Melatonin 10 MG Tabs Take 40 mg by mouth at bedtime.   metFORMIN 1000 MG tablet Commonly known as: GLUCOPHAGE Take  1,000 mg by mouth 2 (two) times daily.   morphine 15 MG tablet Commonly known as: MSIR Take 1 tablet by mouth every 4 (four) hours as needed for pain.   oxyCODONE-acetaminophen 5-325 MG tablet Commonly known as: Percocet Take 1 tablet by mouth every 4 (four) hours as needed (breakthrough pain).   pioglitazone 15 MG tablet Commonly known as: ACTOS Take 15 mg by mouth at bedtime.      Activity: activity as tolerated Diet: regular diet Wound Care: keep wound clean and dry and as directed  Follow-up with Dr. Carlis Abbott in 3 weeks.  Signed: Dagoberto Ligas, PA-C 03/04/2020 1:39 PM VVS Office: [phone]

## 2020-03-04 NOTE — TOC Transition Note (Signed)
Transition of Care (TOC) - CM/SW Discharge Note Marvetta Gibbons RN, BSN Transitions of Care Unit 4E- RN Case Manager See Treatment Team for direct phone #    Patient Details  Name: Wayne Pratt MRN: [id number] Date of Birth: August 11, 1960  Transition of Care [facility]) CM/SW Contact:  Dawayne Patricia, RN Phone Number: 03/04/2020, 8:44 AM   Clinical Narrative:    Pt stable for transition home, home wound VAC has been delivered to room by [employer], bedside RN to transition pt to home [facility] prior to leaving hospital. [name] has been notified that pt will discharge home for [facility] needs.    Final next level of care: [place] Barriers to Discharge: Barriers Resolved   Patient Goals and CMS Choice Patient states their goals for this hospitalization and ongoing recovery are:: to return home CMS Medicare.gov Compare Post Acute Care list provided to:: Patient Choice offered to / list presented to : Patient  Discharge Placement                 home with [facility]      Discharge Plan and Services   Discharge Planning Services: CM Consult Post Acute Care Choice: Home Health          DME Arranged: Vac DME Agency: [employer] Date DME Agency Contacted: 02/29/20 Time DME Agency Contacted: 1228 Representative spoke with at DME Agency: [place]: RN [facility] Agency: [place] Date [place]: 02/29/20 Time [place]: 1229 Representative spoke with at [place]: [place] ([place]) Interventions     Readmission Risk Interventions Readmission Risk Prevention Plan 02/07/2020  Transportation Screening Complete  PCP or Specialist Appt within 5-7 Days Complete  Home Care Screening Complete  Medication Review (RN CM) Complete  Some recent data might be hidden

## 2020-03-05 ENCOUNTER — Telehealth: Payer: Self-pay

## 2020-03-05 DIAGNOSIS — G4700 Insomnia, unspecified: Secondary | ICD-10-CM | POA: Diagnosis not present

## 2020-03-05 DIAGNOSIS — D649 Anemia, unspecified: Secondary | ICD-10-CM | POA: Diagnosis not present

## 2020-03-05 DIAGNOSIS — M479 Spondylosis, unspecified: Secondary | ICD-10-CM | POA: Diagnosis not present

## 2020-03-05 DIAGNOSIS — E1151 Type 2 diabetes mellitus with diabetic peripheral angiopathy without gangrene: Secondary | ICD-10-CM | POA: Diagnosis not present

## 2020-03-05 DIAGNOSIS — Z9181 History of falling: Secondary | ICD-10-CM | POA: Diagnosis not present

## 2020-03-05 DIAGNOSIS — G4730 Sleep apnea, unspecified: Secondary | ICD-10-CM | POA: Diagnosis not present

## 2020-03-05 DIAGNOSIS — Z48812 Encounter for surgical aftercare following surgery on the circulatory system: Secondary | ICD-10-CM | POA: Diagnosis not present

## 2020-03-05 DIAGNOSIS — Z7982 Long term (current) use of aspirin: Secondary | ICD-10-CM | POA: Diagnosis not present

## 2020-03-05 DIAGNOSIS — Z7952 Long term (current) use of systemic steroids: Secondary | ICD-10-CM | POA: Diagnosis not present

## 2020-03-05 DIAGNOSIS — E7800 Pure hypercholesterolemia, unspecified: Secondary | ICD-10-CM | POA: Diagnosis not present

## 2020-03-05 DIAGNOSIS — E785 Hyperlipidemia, unspecified: Secondary | ICD-10-CM | POA: Diagnosis not present

## 2020-03-05 DIAGNOSIS — F17210 Nicotine dependence, cigarettes, uncomplicated: Secondary | ICD-10-CM | POA: Diagnosis not present

## 2020-03-05 DIAGNOSIS — I10 Essential (primary) hypertension: Secondary | ICD-10-CM | POA: Diagnosis not present

## 2020-03-05 DIAGNOSIS — Z7984 Long term (current) use of oral hypoglycemic drugs: Secondary | ICD-10-CM | POA: Diagnosis not present

## 2020-03-05 DIAGNOSIS — Z8701 Personal history of pneumonia (recurrent): Secondary | ICD-10-CM | POA: Diagnosis not present

## 2020-03-05 NOTE — Telephone Encounter (Signed)
Inez Catalina from [place] has called several [facility] several times today regarding this patient's right calf wound. First time, the patient told her the wound vac had come off, and she verified that it was a [employer] vac. When she got there and totally removed vac, it "started pouring blood." She was unable to get it to stop after holding pressure and called EMS. They applied a pressure dressing. Advised them to leave dressing on unless toes became numb and tingly or started to hurt. They are on the PA schedule tomorrow for an evaluation.

## 2020-03-06 ENCOUNTER — Other Ambulatory Visit: Payer: Self-pay

## 2020-03-06 ENCOUNTER — Ambulatory Visit (INDEPENDENT_AMBULATORY_CARE_PROVIDER_SITE_OTHER): Payer: PPO | Admitting: Physician Assistant

## 2020-03-06 VITALS — BP 112/75 | HR 107 | Temp 98.3°F | Resp 20 | Ht 73.0 in | Wt 215.0 lb

## 2020-03-06 DIAGNOSIS — I70229 Atherosclerosis of native arteries of extremities with rest pain, unspecified extremity: Secondary | ICD-10-CM

## 2020-03-06 MED ORDER — OXYCODONE-ACETAMINOPHEN 5-325 MG PO TABS: 1.0000 | ORAL_TABLET | ORAL | 0 refills | Status: DC | PRN

## 2020-03-06 NOTE — Progress Notes (Signed)
POST OPERATIVE OFFICE NOTE    CC:  F/u for surgery  HPI:  This is a 60 y.o. male who called after right LE femoral endarterectomy with femoral to below the knee popliteal bypass with vein by Dr. Carlis Abbott.   Followed by  amputation of right first, second, and fifth toe as well as debridement of right calf wound with VAC placement.  His wife called and stated the wound vac came off on its own and he was having significant bleeding from the posterior calf wound.  He is here today for exam and possible re application of wound vac.    Pt returns today for follow up.    Allergies  Allergen Reactions  . Penicillins Anaphylaxis and Other (See Comments)    Has patient had a PCN reaction causing immediate rash, facial/tongue/throat swelling, SOB or lightheadedness with hypotension: Yes Has patient had a PCN reaction causing severe rash involving mucus membranes or skin necrosis: Yes Has patient had a PCN reaction that required hospitalization: No Has patient had a PCN reaction occurring within the last 10 years: No If all of the above answers are "NO", then may proceed with Cephalosporin use.   . Ativan [Lorazepam] Other (See Comments)    Violent and Mean     Current Outpatient Medications  Medication Sig Dispense Refill  . acetaminophen (TYLENOL) 650 MG CR tablet Take 1,300 mg by mouth every 8 (eight) hours as needed for pain.    [name] Kitchen albuterol (VENTOLIN HFA) 108 (90 Base) MCG/ACT inhaler Inhale 1-2 puffs into the lungs every 6 (six) hours as needed for wheezing or shortness of breath. 18 g 0  . aspirin EC 81 MG EC tablet Take 1 tablet (81 mg total) by mouth daily at 6 (six) AM. Swallow whole. 30 tablet 11  . clonazePAM (KLONOPIN) 0.5 MG tablet Take 0.5 mg by mouth 2 (two) times daily.     . cyclobenzaprine (FLEXERIL) 10 MG tablet Take 10 mg by mouth 3 (three) times daily.    [name] Kitchen GLIPIZIDE XL 10 MG 24 hr tablet Take 10 mg by mouth 2 (two) times daily.    [name] Kitchen HYDROmorphone (DILAUDID) 4 MG tablet Take  4 mg by mouth every 6 (six) hours as needed for severe pain.    . IBU 800 MG tablet Take 800 mg by mouth every 8 (eight) hours as needed for moderate pain.    [name] Kitchen lisinopril (ZESTRIL) 10 MG tablet TAKE 1 TABLET BY MOUTH ONCE A DAY. (Patient taking differently: Take 10 mg by mouth daily.) 90 tablet 0  . lovastatin (MEVACOR) 40 MG tablet Take 40 mg by mouth at bedtime.     . Melatonin 10 MG TABS Take 40 mg by mouth at bedtime.    . metFORMIN (GLUCOPHAGE) 1000 MG tablet Take 1,000 mg by mouth 2 (two) times daily.     [name] Kitchen morphine (MSIR) 15 MG tablet Take 1 tablet by mouth every 4 (four) hours as needed for pain.    [name] Kitchen oxyCODONE-acetaminophen (PERCOCET) 5-325 MG tablet Take 1 tablet by mouth every 4 (four) hours as needed (breakthrough pain). 30 tablet 0  . pioglitazone (ACTOS) 15 MG tablet Take 15 mg by mouth at bedtime.     No current facility-administered medications for this visit.     ROS:  See HPI  Physical Exam:            Assessment/Plan:  This is a 60 y.o. male who is s/p: amputation of right first, second, and fifth toe as  well as debridement of right calf wound with VAC placement.   The incisions are healing and there is no sign of infection.    -Wound vac placed over post calf wound.  He will keep his appt in the near future. He has Horace RN coming for additional wound vac changes.  The skin edges were the likely area for the bleeding.  No active bleeding noted today.  [name] PA-C [facility] [phone]  Clinic MD:  Oneida Alar

## 2020-03-11 ENCOUNTER — Other Ambulatory Visit: Payer: Self-pay

## 2020-03-11 DIAGNOSIS — I70229 Atherosclerosis of native arteries of extremities with rest pain, unspecified extremity: Secondary | ICD-10-CM

## 2020-03-12 DIAGNOSIS — E7800 Pure hypercholesterolemia, unspecified: Secondary | ICD-10-CM | POA: Diagnosis not present

## 2020-03-12 DIAGNOSIS — E1151 Type 2 diabetes mellitus with diabetic peripheral angiopathy without gangrene: Secondary | ICD-10-CM | POA: Diagnosis not present

## 2020-03-12 DIAGNOSIS — E785 Hyperlipidemia, unspecified: Secondary | ICD-10-CM | POA: Diagnosis not present

## 2020-03-12 DIAGNOSIS — Z7952 Long term (current) use of systemic steroids: Secondary | ICD-10-CM | POA: Diagnosis not present

## 2020-03-12 DIAGNOSIS — F17210 Nicotine dependence, cigarettes, uncomplicated: Secondary | ICD-10-CM | POA: Diagnosis not present

## 2020-03-12 DIAGNOSIS — Z9181 History of falling: Secondary | ICD-10-CM | POA: Diagnosis not present

## 2020-03-12 DIAGNOSIS — G4700 Insomnia, unspecified: Secondary | ICD-10-CM | POA: Diagnosis not present

## 2020-03-12 DIAGNOSIS — Z7984 Long term (current) use of oral hypoglycemic drugs: Secondary | ICD-10-CM | POA: Diagnosis not present

## 2020-03-12 DIAGNOSIS — Z48812 Encounter for surgical aftercare following surgery on the circulatory system: Secondary | ICD-10-CM | POA: Diagnosis not present

## 2020-03-12 DIAGNOSIS — I10 Essential (primary) hypertension: Secondary | ICD-10-CM | POA: Diagnosis not present

## 2020-03-12 DIAGNOSIS — D649 Anemia, unspecified: Secondary | ICD-10-CM | POA: Diagnosis not present

## 2020-03-12 DIAGNOSIS — M479 Spondylosis, unspecified: Secondary | ICD-10-CM | POA: Diagnosis not present

## 2020-03-12 DIAGNOSIS — Z7982 Long term (current) use of aspirin: Secondary | ICD-10-CM | POA: Diagnosis not present

## 2020-03-12 DIAGNOSIS — G4730 Sleep apnea, unspecified: Secondary | ICD-10-CM | POA: Diagnosis not present

## 2020-03-12 DIAGNOSIS — Z8701 Personal history of pneumonia (recurrent): Secondary | ICD-10-CM | POA: Diagnosis not present

## 2020-03-18 ENCOUNTER — Ambulatory Visit (INDEPENDENT_AMBULATORY_CARE_PROVIDER_SITE_OTHER)
Admit: 2020-03-18 | Discharge: 2020-03-18 | Disposition: A | Discharge status: Home / Self Care | Payer: PPO | Attending: Vascular Surgery | Admitting: Vascular Surgery

## 2020-03-18 ENCOUNTER — Ambulatory Visit (HOSPITAL_COMMUNITY)
Admission: RE | Admit: 2020-03-18 | Discharge: 2020-03-18 | Disposition: A | Discharge status: Home / Self Care | Payer: PPO | Source: Ambulatory Visit | Attending: Vascular Surgery | Admitting: Vascular Surgery

## 2020-03-18 ENCOUNTER — Ambulatory Visit (INDEPENDENT_AMBULATORY_CARE_PROVIDER_SITE_OTHER): Payer: PPO | Admitting: Physician Assistant

## 2020-03-18 ENCOUNTER — Telehealth: Payer: Self-pay

## 2020-03-18 ENCOUNTER — Other Ambulatory Visit: Payer: Self-pay

## 2020-03-18 ENCOUNTER — Encounter: Payer: Self-pay | Admitting: Physician Assistant

## 2020-03-18 VITALS — BP 118/80 | HR 99 | Temp 98.6°F | Resp 20 | Ht 73.0 in | Wt 218.0 lb

## 2020-03-18 DIAGNOSIS — I70229 Atherosclerosis of native arteries of extremities with rest pain, unspecified extremity: Secondary | ICD-10-CM

## 2020-03-18 MED ORDER — OXYCODONE-ACETAMINOPHEN 5-325 MG PO TABS: 1.0000 | ORAL_TABLET | ORAL | 0 refills | Status: DC | PRN

## 2020-03-18 NOTE — Telephone Encounter (Signed)
Contacted [facility] and gave verbal order to d/c wound vac and switch to wet to dry packing of R calf wound per ME stf msg.

## 2020-03-18 NOTE — Progress Notes (Signed)
    Postoperative Visit   History of Present Illness   Wayne Pratt is a 60 y.o. year old male who presents for postoperative follow-up for right LE femoral endarterectomy with femoral to below the knee popliteal bypass with vein by Dr. Carlis Abbott 02/04/20.  He subsequently underwent partial first, second, and fifth toe amputations and debridement of calf wound with wound vac placement 02/29/20.  He returns today for wound check.  Patient states he is having tremendous pain with VAC changes and believes his mobility is not improving postoperatively due to the presence of the wound VAC.  He denies rest pain in his foot and believes his toe amputations are healing well.  He also states the incisions of his right groin and popliteal as well as the vein harvest incisions are completely healed.  He is also trying to avoid pressure to his right heel due to a shallow heel ulceration.  He is ambulating with a Darco shoe.   For VQI Use Only   PRE-ADM LIVING: Home  AMB STATUS: Ambulatory with Assistance   Physical Examination   Vitals:   03/18/20 1143  BP: 118/80  Pulse: 99  Resp: 20  Temp: 98.6 F (37 C)  TempSrc: Temporal  SpO2: 99%  Weight: 218 lb (98.9 kg)  Height: 6\' 1"  (1.854 m)    RLE: Right groin nearly completely healed with small area of dry eschar; vein harvest and popliteal incision completely healed; right calf wound with 50% granulation and some fibrinous exudate midline; toe amp incisions healing well without any purulence or drainage; brisk DP and PT signal by Doppler  Right ABI now within normal limits   Medical Decision Making   Wayne Pratt is a 60 y.o. year old male who presents s/p femoral endarterectomy with femoropopliteal bypass with vein and subsequent partial toe amputations and VAC placement of calf wound.  . Right foot is well-perfused and now has an ABI within normal limits . Right groin and vein harvest incisions completely healed . Discontinue wound VAC and  begin wet-to-dry dressing changes to right calf wound . Continue to offload right heel . Strongly encouraged improving daily hygiene and washing leg with soap and water then pat to dry . Follow-up in 1 week for suture removal; PA will notify Dr. Carlis Abbott during office visit   Dagoberto Ligas PA-C [facility] [place] Office: [phone]  Clinic MD: Carlis Abbott

## 2020-03-21 DIAGNOSIS — I10 Essential (primary) hypertension: Secondary | ICD-10-CM | POA: Diagnosis not present

## 2020-03-21 DIAGNOSIS — F17210 Nicotine dependence, cigarettes, uncomplicated: Secondary | ICD-10-CM | POA: Diagnosis not present

## 2020-03-21 DIAGNOSIS — Z7952 Long term (current) use of systemic steroids: Secondary | ICD-10-CM | POA: Diagnosis not present

## 2020-03-21 DIAGNOSIS — Z8701 Personal history of pneumonia (recurrent): Secondary | ICD-10-CM | POA: Diagnosis not present

## 2020-03-21 DIAGNOSIS — Z7984 Long term (current) use of oral hypoglycemic drugs: Secondary | ICD-10-CM | POA: Diagnosis not present

## 2020-03-21 DIAGNOSIS — G4730 Sleep apnea, unspecified: Secondary | ICD-10-CM | POA: Diagnosis not present

## 2020-03-21 DIAGNOSIS — M479 Spondylosis, unspecified: Secondary | ICD-10-CM | POA: Diagnosis not present

## 2020-03-21 DIAGNOSIS — E7800 Pure hypercholesterolemia, unspecified: Secondary | ICD-10-CM | POA: Diagnosis not present

## 2020-03-21 DIAGNOSIS — E1151 Type 2 diabetes mellitus with diabetic peripheral angiopathy without gangrene: Secondary | ICD-10-CM | POA: Diagnosis not present

## 2020-03-21 DIAGNOSIS — D649 Anemia, unspecified: Secondary | ICD-10-CM | POA: Diagnosis not present

## 2020-03-21 DIAGNOSIS — Z48812 Encounter for surgical aftercare following surgery on the circulatory system: Secondary | ICD-10-CM | POA: Diagnosis not present

## 2020-03-21 DIAGNOSIS — Z9181 History of falling: Secondary | ICD-10-CM | POA: Diagnosis not present

## 2020-03-21 DIAGNOSIS — G4700 Insomnia, unspecified: Secondary | ICD-10-CM | POA: Diagnosis not present

## 2020-03-21 DIAGNOSIS — E785 Hyperlipidemia, unspecified: Secondary | ICD-10-CM | POA: Diagnosis not present

## 2020-03-21 DIAGNOSIS — Z7982 Long term (current) use of aspirin: Secondary | ICD-10-CM | POA: Diagnosis not present

## 2020-03-25 ENCOUNTER — Ambulatory Visit: Payer: PPO

## 2020-03-26 ENCOUNTER — Ambulatory Visit (INDEPENDENT_AMBULATORY_CARE_PROVIDER_SITE_OTHER): Payer: PPO | Admitting: Physician Assistant

## 2020-03-26 ENCOUNTER — Other Ambulatory Visit: Payer: Self-pay

## 2020-03-26 VITALS — BP 110/75 | HR 95 | Temp 98.0°F | Resp 20 | Ht 73.0 in

## 2020-03-26 DIAGNOSIS — I70229 Atherosclerosis of native arteries of extremities with rest pain, unspecified extremity: Secondary | ICD-10-CM

## 2020-03-26 NOTE — Progress Notes (Signed)
POST OPERATIVE OFFICE NOTE    CC:  F/u for surgery  HPI:  This is a 60 y.o. male who is s/p right LE femoral endarterectomy with femoral to below the knee popliteal bypass with vein by Dr. Carlis Abbott 02/04/20.  He subsequently underwent partial first, second, and fifth toe amputations and debridement of calf wound with wound vac placement 02/29/20.    He is here today for follow up and possible suture removal.  The wound vac was discontinued last visit on 03/18/20 and changed to wet to dry.   He states he is walking a little better and they thing his wounds are healing.      Allergies  Allergen Reactions  . Penicillins Anaphylaxis and Other (See Comments)    Has patient had a PCN reaction causing immediate rash, facial/tongue/throat swelling, SOB or lightheadedness with hypotension: Yes Has patient had a PCN reaction causing severe rash involving mucus membranes or skin necrosis: Yes Has patient had a PCN reaction that required hospitalization: No Has patient had a PCN reaction occurring within the last 10 years: No If all of the above answers are "NO", then may proceed with Cephalosporin use.   . Ativan [Lorazepam] Other (See Comments)    Violent and Mean     Current Outpatient Medications  Medication Sig Dispense Refill  . acetaminophen (TYLENOL) 650 MG CR tablet Take 1,300 mg by mouth every 8 (eight) hours as needed for pain.    [name] Kitchen albuterol (VENTOLIN HFA) 108 (90 Base) MCG/ACT inhaler Inhale 1-2 puffs into the lungs every 6 (six) hours as needed for wheezing or shortness of breath. 18 g 0  . aspirin EC 81 MG EC tablet Take 1 tablet (81 mg total) by mouth daily at 6 (six) AM. Swallow whole. 30 tablet 11  . clonazePAM (KLONOPIN) 0.5 MG tablet Take 0.5 mg by mouth 2 (two) times daily.     . cyclobenzaprine (FLEXERIL) 10 MG tablet Take 10 mg by mouth 3 (three) times daily.    [name] Kitchen GLIPIZIDE XL 10 MG 24 hr tablet Take 10 mg by mouth 2 (two) times daily.    [name] Kitchen HYDROmorphone (DILAUDID) 4 MG tablet  Take 4 mg by mouth every 6 (six) hours as needed for severe pain.    . IBU 800 MG tablet Take 800 mg by mouth every 8 (eight) hours as needed for moderate pain.    [name] Kitchen lisinopril (ZESTRIL) 10 MG tablet TAKE 1 TABLET BY MOUTH ONCE A DAY. (Patient taking differently: Take 10 mg by mouth daily.) 90 tablet 0  . lovastatin (MEVACOR) 40 MG tablet Take 40 mg by mouth at bedtime.     . Melatonin 10 MG TABS Take 40 mg by mouth at bedtime.    . metFORMIN (GLUCOPHAGE) 1000 MG tablet Take 1,000 mg by mouth 2 (two) times daily.     [name] Kitchen morphine (MSIR) 15 MG tablet Take 1 tablet by mouth every 4 (four) hours as needed for pain.    [name] Kitchen oxyCODONE-acetaminophen (PERCOCET) 5-325 MG tablet Take 1 tablet by mouth every 4 (four) hours as needed (breakthrough pain). 20 tablet 0  . pioglitazone (ACTOS) 15 MG tablet Take 15 mg by mouth at bedtime.     No current facility-administered medications for this visit.     ROS:  See HPI  Physical Exam:        Incision:  Toe amputation sites are well healed.  Sutures were removed.  No erythema or drainage. Extremities:  Calf wound has 40 % granulation  tissue.   Heel wound superficial with 20-30 % dry gangrene, no drainage.      Assessment/Plan:  This is a 60 y.o. male who is s/p:right LE femoral endarterectomy with femoral to below the knee popliteal bypass with vein by Dr. Carlis Abbott 02/04/20.  He subsequently underwent partial first, second, and fifth toe amputations and debridement of calf wound with wound vac placement 02/29/20.    Wet to dry dressing calf wound change daily.  Dry dressing as needed to heel.  He can gradually work into a comfortable shoe and slowly increase everyday activity.  Elevation of LE when at rest.  I will have him f/u in 3-4 weeks for wound check.  At that time we can schedule bypass duplex and ABI for 6 months out.    [name] PA-C [facility] [phone]   Clinic MD:  Scot Dock

## 2020-03-27 DIAGNOSIS — I739 Peripheral vascular disease, unspecified: Secondary | ICD-10-CM | POA: Diagnosis not present

## 2020-03-27 DIAGNOSIS — E1142 Type 2 diabetes mellitus with diabetic polyneuropathy: Secondary | ICD-10-CM | POA: Diagnosis not present

## 2020-03-27 DIAGNOSIS — Z79891 Long term (current) use of opiate analgesic: Secondary | ICD-10-CM | POA: Diagnosis not present

## 2020-03-27 DIAGNOSIS — M4722 Other spondylosis with radiculopathy, cervical region: Secondary | ICD-10-CM | POA: Diagnosis not present

## 2020-04-01 DIAGNOSIS — E1151 Type 2 diabetes mellitus with diabetic peripheral angiopathy without gangrene: Secondary | ICD-10-CM | POA: Diagnosis not present

## 2020-04-01 DIAGNOSIS — Z48812 Encounter for surgical aftercare following surgery on the circulatory system: Secondary | ICD-10-CM | POA: Diagnosis not present

## 2020-04-01 DIAGNOSIS — Z7982 Long term (current) use of aspirin: Secondary | ICD-10-CM | POA: Diagnosis not present

## 2020-04-01 DIAGNOSIS — Z8701 Personal history of pneumonia (recurrent): Secondary | ICD-10-CM | POA: Diagnosis not present

## 2020-04-01 DIAGNOSIS — E7800 Pure hypercholesterolemia, unspecified: Secondary | ICD-10-CM | POA: Diagnosis not present

## 2020-04-01 DIAGNOSIS — F17210 Nicotine dependence, cigarettes, uncomplicated: Secondary | ICD-10-CM | POA: Diagnosis not present

## 2020-04-01 DIAGNOSIS — G4730 Sleep apnea, unspecified: Secondary | ICD-10-CM | POA: Diagnosis not present

## 2020-04-01 DIAGNOSIS — D649 Anemia, unspecified: Secondary | ICD-10-CM | POA: Diagnosis not present

## 2020-04-01 DIAGNOSIS — M479 Spondylosis, unspecified: Secondary | ICD-10-CM | POA: Diagnosis not present

## 2020-04-01 DIAGNOSIS — I10 Essential (primary) hypertension: Secondary | ICD-10-CM | POA: Diagnosis not present

## 2020-04-01 DIAGNOSIS — E785 Hyperlipidemia, unspecified: Secondary | ICD-10-CM | POA: Diagnosis not present

## 2020-04-01 DIAGNOSIS — Z7952 Long term (current) use of systemic steroids: Secondary | ICD-10-CM | POA: Diagnosis not present

## 2020-04-01 DIAGNOSIS — Z7984 Long term (current) use of oral hypoglycemic drugs: Secondary | ICD-10-CM | POA: Diagnosis not present

## 2020-04-01 DIAGNOSIS — G4700 Insomnia, unspecified: Secondary | ICD-10-CM | POA: Diagnosis not present

## 2020-04-01 DIAGNOSIS — Z9181 History of falling: Secondary | ICD-10-CM | POA: Diagnosis not present

## 2020-04-07 DIAGNOSIS — G4730 Sleep apnea, unspecified: Secondary | ICD-10-CM | POA: Diagnosis not present

## 2020-04-07 DIAGNOSIS — Z8701 Personal history of pneumonia (recurrent): Secondary | ICD-10-CM | POA: Diagnosis not present

## 2020-04-07 DIAGNOSIS — E785 Hyperlipidemia, unspecified: Secondary | ICD-10-CM | POA: Diagnosis not present

## 2020-04-07 DIAGNOSIS — Z7952 Long term (current) use of systemic steroids: Secondary | ICD-10-CM | POA: Diagnosis not present

## 2020-04-07 DIAGNOSIS — F17210 Nicotine dependence, cigarettes, uncomplicated: Secondary | ICD-10-CM | POA: Diagnosis not present

## 2020-04-07 DIAGNOSIS — Z9181 History of falling: Secondary | ICD-10-CM | POA: Diagnosis not present

## 2020-04-07 DIAGNOSIS — D649 Anemia, unspecified: Secondary | ICD-10-CM | POA: Diagnosis not present

## 2020-04-07 DIAGNOSIS — Z7984 Long term (current) use of oral hypoglycemic drugs: Secondary | ICD-10-CM | POA: Diagnosis not present

## 2020-04-07 DIAGNOSIS — Z48812 Encounter for surgical aftercare following surgery on the circulatory system: Secondary | ICD-10-CM | POA: Diagnosis not present

## 2020-04-07 DIAGNOSIS — G4700 Insomnia, unspecified: Secondary | ICD-10-CM | POA: Diagnosis not present

## 2020-04-07 DIAGNOSIS — E1151 Type 2 diabetes mellitus with diabetic peripheral angiopathy without gangrene: Secondary | ICD-10-CM | POA: Diagnosis not present

## 2020-04-07 DIAGNOSIS — I10 Essential (primary) hypertension: Secondary | ICD-10-CM | POA: Diagnosis not present

## 2020-04-07 DIAGNOSIS — M479 Spondylosis, unspecified: Secondary | ICD-10-CM | POA: Diagnosis not present

## 2020-04-07 DIAGNOSIS — Z7982 Long term (current) use of aspirin: Secondary | ICD-10-CM | POA: Diagnosis not present

## 2020-04-07 DIAGNOSIS — E7800 Pure hypercholesterolemia, unspecified: Secondary | ICD-10-CM | POA: Diagnosis not present

## 2020-04-08 DIAGNOSIS — S81801S Unspecified open wound, right lower leg, sequela: Secondary | ICD-10-CM | POA: Diagnosis not present

## 2020-04-08 DIAGNOSIS — I70222 Atherosclerosis of native arteries of extremities with rest pain, left leg: Secondary | ICD-10-CM | POA: Diagnosis not present

## 2020-04-08 DIAGNOSIS — X58XXXS Exposure to other specified factors, sequela: Secondary | ICD-10-CM | POA: Diagnosis not present

## 2020-04-08 DIAGNOSIS — I70221 Atherosclerosis of native arteries of extremities with rest pain, right leg: Secondary | ICD-10-CM | POA: Diagnosis not present

## 2020-04-10 ENCOUNTER — Telehealth: Payer: Self-pay | Admitting: *Deleted

## 2020-04-10 NOTE — Telephone Encounter (Signed)
Patient's wife Hilda Blades called stating that calf wound has worsened.  The wound has an odor and is draining.  Patient has recently hit his great toe amputation site causing redness as well. Denies fever. His appointment was changed to 04/15/2020. Wife is to notify office if worsening symptoms occur.

## 2020-04-14 DIAGNOSIS — Z8701 Personal history of pneumonia (recurrent): Secondary | ICD-10-CM | POA: Diagnosis not present

## 2020-04-14 DIAGNOSIS — E1151 Type 2 diabetes mellitus with diabetic peripheral angiopathy without gangrene: Secondary | ICD-10-CM | POA: Diagnosis not present

## 2020-04-14 DIAGNOSIS — F17210 Nicotine dependence, cigarettes, uncomplicated: Secondary | ICD-10-CM | POA: Diagnosis not present

## 2020-04-14 DIAGNOSIS — Z7952 Long term (current) use of systemic steroids: Secondary | ICD-10-CM | POA: Diagnosis not present

## 2020-04-14 DIAGNOSIS — Z48812 Encounter for surgical aftercare following surgery on the circulatory system: Secondary | ICD-10-CM | POA: Diagnosis not present

## 2020-04-14 DIAGNOSIS — E785 Hyperlipidemia, unspecified: Secondary | ICD-10-CM | POA: Diagnosis not present

## 2020-04-14 DIAGNOSIS — D649 Anemia, unspecified: Secondary | ICD-10-CM | POA: Diagnosis not present

## 2020-04-14 DIAGNOSIS — Z9181 History of falling: Secondary | ICD-10-CM | POA: Diagnosis not present

## 2020-04-14 DIAGNOSIS — G4700 Insomnia, unspecified: Secondary | ICD-10-CM | POA: Diagnosis not present

## 2020-04-14 DIAGNOSIS — G4730 Sleep apnea, unspecified: Secondary | ICD-10-CM | POA: Diagnosis not present

## 2020-04-14 DIAGNOSIS — M479 Spondylosis, unspecified: Secondary | ICD-10-CM | POA: Diagnosis not present

## 2020-04-14 DIAGNOSIS — Z7984 Long term (current) use of oral hypoglycemic drugs: Secondary | ICD-10-CM | POA: Diagnosis not present

## 2020-04-14 DIAGNOSIS — Z7982 Long term (current) use of aspirin: Secondary | ICD-10-CM | POA: Diagnosis not present

## 2020-04-14 DIAGNOSIS — E7800 Pure hypercholesterolemia, unspecified: Secondary | ICD-10-CM | POA: Diagnosis not present

## 2020-04-14 DIAGNOSIS — I10 Essential (primary) hypertension: Secondary | ICD-10-CM | POA: Diagnosis not present

## 2020-04-15 ENCOUNTER — Other Ambulatory Visit: Payer: Self-pay

## 2020-04-15 ENCOUNTER — Encounter: Payer: Self-pay | Admitting: Physician Assistant

## 2020-04-15 ENCOUNTER — Ambulatory Visit (INDEPENDENT_AMBULATORY_CARE_PROVIDER_SITE_OTHER): Payer: PPO | Admitting: Physician Assistant

## 2020-04-15 VITALS — BP 104/70 | HR 100 | Temp 98.7°F | Resp 20 | Ht 73.0 in | Wt 228.0 lb

## 2020-04-15 DIAGNOSIS — I70229 Atherosclerosis of native arteries of extremities with rest pain, unspecified extremity: Secondary | ICD-10-CM

## 2020-04-15 NOTE — H&P (View-Only) (Signed)
POST OPERATIVE OFFICE NOTE    CC:  F/u for surgery  HPI:  This is a 60 y.o. male who is s/p right LE femoral endarterectomy with femoral to below the knee popliteal bypass with vein by Dr. Carlis Abbott 02/04/20. He subsequently underwent partial first, second, and fifth toe amputations and debridement of calf wound with wound vac placement 02/29/20. The wound vac was discontinued and he has been using wet to dry dressing changes.    Pt returns today for follow up.  Pt states he doesn't think the posterior calf wound on his right leg is healing and it smells bad.  His foot feels fine and his toes have healed well.  Allergies  Allergen Reactions  . Penicillins Anaphylaxis and Other (See Comments)    Has patient had a PCN reaction causing immediate rash, facial/tongue/throat swelling, SOB or lightheadedness with hypotension: Yes Has patient had a PCN reaction causing severe rash involving mucus membranes or skin necrosis: Yes Has patient had a PCN reaction that required hospitalization: No Has patient had a PCN reaction occurring within the last 10 years: No If all of the above answers are "NO", then may proceed with Cephalosporin use.   . Ativan [Lorazepam] Other (See Comments)    Violent and Mean     Current Outpatient Medications  Medication Sig Dispense Refill  . acetaminophen (TYLENOL) 650 MG CR tablet Take 1,300 mg by mouth every 8 (eight) hours as needed for pain.    [name] Kitchen albuterol (VENTOLIN HFA) 108 (90 Base) MCG/ACT inhaler Inhale 1-2 puffs into the lungs every 6 (six) hours as needed for wheezing or shortness of breath. 18 g 0  . aspirin EC 81 MG EC tablet Take 1 tablet (81 mg total) by mouth daily at 6 (six) AM. Swallow whole. 30 tablet 11  . clonazePAM (KLONOPIN) 0.5 MG tablet Take 0.5 mg by mouth 2 (two) times daily.     . cyclobenzaprine (FLEXERIL) 10 MG tablet Take 10 mg by mouth 3 (three) times daily.    [name] Kitchen GLIPIZIDE XL 10 MG 24 hr tablet Take 10 mg by mouth 2 (two) times daily.     [name] Kitchen HYDROmorphone (DILAUDID) 4 MG tablet Take 4 mg by mouth every 6 (six) hours as needed for severe pain.    . IBU 800 MG tablet Take 800 mg by mouth every 8 (eight) hours as needed for moderate pain.    [name] Kitchen lisinopril (ZESTRIL) 10 MG tablet TAKE 1 TABLET BY MOUTH ONCE A DAY. (Patient taking differently: Take 10 mg by mouth daily.) 90 tablet 0  . lovastatin (MEVACOR) 40 MG tablet Take 40 mg by mouth at bedtime.     . Melatonin 10 MG TABS Take 40 mg by mouth at bedtime.    . metFORMIN (GLUCOPHAGE) 1000 MG tablet Take 1,000 mg by mouth 2 (two) times daily.     [name] Kitchen morphine (MSIR) 15 MG tablet Take 1 tablet by mouth every 4 (four) hours as needed for pain.    [name] Kitchen oxyCODONE-acetaminophen (PERCOCET) 5-325 MG tablet Take 1 tablet by mouth every 4 (four) hours as needed (breakthrough pain). 20 tablet 0  . pioglitazone (ACTOS) 15 MG tablet Take 15 mg by mouth at bedtime.     No current facility-administered medications for this visit.     ROS:  See HPI  Physical Exam:    Incision:  amputated toes have healed well Extremities:  Brisk doppler PT and DP intact right LE    Heart : RRR Abdomen:  Soft +  BS   Assessment/Plan:  This is a 60 y.o. male who is s/p right LE femoral endarterectomy with femoral to below the knee popliteal bypass with vein by Dr. Carlis Abbott 02/04/20. He subsequently underwent partial first, second, and fifth toe amputations and debridement of calf wound with wound vac placement 02/29/20. The wound vac was discontinued and he has been using wet to dry dressing changes.    He has had the posterior calf wound debrided and it is failing to heal.Dr. Carlis Abbott examined the wound and we will schedule him for irrigation and debridement with graft and wound vac 04/16/20.       [name] PA-C [facility] [phone]   Clinic MD:  Carlis Abbott

## 2020-04-15 NOTE — Progress Notes (Signed)
Mr. Thielen will be tested for Covid in am.  I instructed - to arrive at 1055 go to the Admitting Office- it is down the hall on the left.  Do Not Eat or Drink after Midnight.   Shower with antibiotic soap if you have it, rinse thoroughly.  Dry off with a clean towel. Do not wear any lotions, powders, colognes, deodorant. Men my shave face and neck. Women should not shave within 48 hours prior to surgery.  Brush your teeth. If you wear glasses, contact lens, hearing aids, dentures or partials; you may not wear them in OR, if you have a case for your glasses- bring it. It is best to not wear hearing aids- if it is the only way we can communicate wear them , bring the case- have someone to take the hearing aids and case after you are finished answering questions. Dentures and partials will be removed and placed in a denture cup and placed with your clothes.  Take the following medications with a sip of water: ASA, Flexeril.  Prn: Albuterol inhaler- bring it with you, you may take tylenol, Dilaudid or MS IR, do not take them all together.  Do not take Glipizide tonight and do not take any medications for diabetes in am. in am.  I instructed patient to check CBG after awaking and every 2 hours until arrival  to the hospital.  I Instructed patient if CBG is less than 70 to take 4 Glucose Tablets or 1 tube of Glucose Gel or 1/2 cup of a clear juice. Recheck CBG in 15 minutes if CBG is not over 70 call, pre- op desk at [phone] for further instructions. If scheduled to receive Insulin, do not take Insulin If you have any questions or problems or if you are going to be late call [phone], this is the pre- surgery desk; there may not be anyone at the number I called you on tomorrow, so call the number I called you on.  If you are going home after the surgery/procedure, you will need someone to drive you home and stay with you for the first 24 hours after surgery.  If you smoke do not smoke for 24 hours  prior to surgery.

## 2020-04-15 NOTE — Progress Notes (Signed)
POST OPERATIVE OFFICE NOTE    CC:  F/u for surgery  HPI:  This is a 60 y.o. male who is s/p right LE femoral endarterectomy with femoral to below the knee popliteal bypass with vein by Dr. Carlis Abbott 02/04/20. He subsequently underwent partial first, second, and fifth toe amputations and debridement of calf wound with wound vac placement 02/29/20. The wound vac was discontinued and he has been using wet to dry dressing changes.    Pt returns today for follow up.  Pt states he doesn't think the posterior calf wound on his right leg is healing and it smells bad.  His foot feels fine and his toes have healed well.  Allergies  Allergen Reactions  . Penicillins Anaphylaxis and Other (See Comments)    Has patient had a PCN reaction causing immediate rash, facial/tongue/throat swelling, SOB or lightheadedness with hypotension: Yes Has patient had a PCN reaction causing severe rash involving mucus membranes or skin necrosis: Yes Has patient had a PCN reaction that required hospitalization: No Has patient had a PCN reaction occurring within the last 10 years: No If all of the above answers are "NO", then may proceed with Cephalosporin use.   . Ativan [Lorazepam] Other (See Comments)    Violent and Mean     Current Outpatient Medications  Medication Sig Dispense Refill  . acetaminophen (TYLENOL) 650 MG CR tablet Take 1,300 mg by mouth every 8 (eight) hours as needed for pain.    [name] Kitchen albuterol (VENTOLIN HFA) 108 (90 Base) MCG/ACT inhaler Inhale 1-2 puffs into the lungs every 6 (six) hours as needed for wheezing or shortness of breath. 18 g 0  . aspirin EC 81 MG EC tablet Take 1 tablet (81 mg total) by mouth daily at 6 (six) AM. Swallow whole. 30 tablet 11  . clonazePAM (KLONOPIN) 0.5 MG tablet Take 0.5 mg by mouth 2 (two) times daily.     . cyclobenzaprine (FLEXERIL) 10 MG tablet Take 10 mg by mouth 3 (three) times daily.    [name] Kitchen GLIPIZIDE XL 10 MG 24 hr tablet Take 10 mg by mouth 2 (two) times daily.     [name] Kitchen HYDROmorphone (DILAUDID) 4 MG tablet Take 4 mg by mouth every 6 (six) hours as needed for severe pain.    . IBU 800 MG tablet Take 800 mg by mouth every 8 (eight) hours as needed for moderate pain.    [name] Kitchen lisinopril (ZESTRIL) 10 MG tablet TAKE 1 TABLET BY MOUTH ONCE A DAY. (Patient taking differently: Take 10 mg by mouth daily.) 90 tablet 0  . lovastatin (MEVACOR) 40 MG tablet Take 40 mg by mouth at bedtime.     . Melatonin 10 MG TABS Take 40 mg by mouth at bedtime.    . metFORMIN (GLUCOPHAGE) 1000 MG tablet Take 1,000 mg by mouth 2 (two) times daily.     [name] Kitchen morphine (MSIR) 15 MG tablet Take 1 tablet by mouth every 4 (four) hours as needed for pain.    [name] Kitchen oxyCODONE-acetaminophen (PERCOCET) 5-325 MG tablet Take 1 tablet by mouth every 4 (four) hours as needed (breakthrough pain). 20 tablet 0  . pioglitazone (ACTOS) 15 MG tablet Take 15 mg by mouth at bedtime.     No current facility-administered medications for this visit.     ROS:  See HPI  Physical Exam:    Incision:  amputated toes have healed well Extremities:  Brisk doppler PT and DP intact right LE    Heart : RRR Abdomen:  Soft +  BS   Assessment/Plan:  This is a 60 y.o. male who is s/p right LE femoral endarterectomy with femoral to below the knee popliteal bypass with vein by Dr. Carlis Abbott 02/04/20. He subsequently underwent partial first, second, and fifth toe amputations and debridement of calf wound with wound vac placement 02/29/20. The wound vac was discontinued and he has been using wet to dry dressing changes.    He has had the posterior calf wound debrided and it is failing to heal.Dr. Carlis Abbott examined the wound and we will schedule him for irrigation and debridement with graft and wound vac 04/16/20.       [name] PA-C [facility] [phone]   Clinic MD:  Carlis Abbott

## 2020-04-16 ENCOUNTER — Ambulatory Visit (HOSPITAL_COMMUNITY)
Admission: RE | Admit: 2020-04-16 | Discharge: 2020-04-16 | Disposition: A | Discharge status: Home / Self Care | Payer: PPO | Attending: Vascular Surgery | Admitting: Vascular Surgery

## 2020-04-16 ENCOUNTER — Encounter (HOSPITAL_COMMUNITY): Payer: Self-pay | Admitting: Vascular Surgery

## 2020-04-16 ENCOUNTER — Other Ambulatory Visit: Payer: Self-pay

## 2020-04-16 ENCOUNTER — Ambulatory Visit (HOSPITAL_COMMUNITY): Payer: PPO | Admitting: Anesthesiology

## 2020-04-16 ENCOUNTER — Encounter (HOSPITAL_COMMUNITY)
Admission: RE | Disposition: A | Discharge status: Home / Self Care | Payer: Self-pay | Source: Home / Self Care | Attending: Vascular Surgery

## 2020-04-16 DIAGNOSIS — Z880 Allergy status to penicillin: Secondary | ICD-10-CM | POA: Diagnosis not present

## 2020-04-16 DIAGNOSIS — I96 Gangrene, not elsewhere classified: Secondary | ICD-10-CM | POA: Diagnosis not present

## 2020-04-16 DIAGNOSIS — Z7982 Long term (current) use of aspirin: Secondary | ICD-10-CM | POA: Insufficient documentation

## 2020-04-16 DIAGNOSIS — Z87891 Personal history of nicotine dependence: Secondary | ICD-10-CM | POA: Diagnosis not present

## 2020-04-16 DIAGNOSIS — T8189XA Other complications of procedures, not elsewhere classified, initial encounter: Secondary | ICD-10-CM | POA: Diagnosis not present

## 2020-04-16 DIAGNOSIS — Y838 Other surgical procedures as the cause of abnormal reaction of the patient, or of later complication, without mention of misadventure at the time of the procedure: Secondary | ICD-10-CM | POA: Insufficient documentation

## 2020-04-16 DIAGNOSIS — Z95820 Peripheral vascular angioplasty status with implants and grafts: Secondary | ICD-10-CM | POA: Insufficient documentation

## 2020-04-16 DIAGNOSIS — Z7984 Long term (current) use of oral hypoglycemic drugs: Secondary | ICD-10-CM | POA: Insufficient documentation

## 2020-04-16 DIAGNOSIS — Z20822 Contact with and (suspected) exposure to covid-19: Secondary | ICD-10-CM | POA: Insufficient documentation

## 2020-04-16 DIAGNOSIS — Z89421 Acquired absence of other right toe(s): Secondary | ICD-10-CM | POA: Insufficient documentation

## 2020-04-16 DIAGNOSIS — Z79899 Other long term (current) drug therapy: Secondary | ICD-10-CM | POA: Diagnosis not present

## 2020-04-16 DIAGNOSIS — S81801A Unspecified open wound, right lower leg, initial encounter: Secondary | ICD-10-CM | POA: Diagnosis not present

## 2020-04-16 DIAGNOSIS — Z89411 Acquired absence of right great toe: Secondary | ICD-10-CM | POA: Insufficient documentation

## 2020-04-16 DIAGNOSIS — E1165 Type 2 diabetes mellitus with hyperglycemia: Secondary | ICD-10-CM | POA: Diagnosis not present

## 2020-04-16 DIAGNOSIS — I10 Essential (primary) hypertension: Secondary | ICD-10-CM | POA: Diagnosis not present

## 2020-04-16 HISTORY — PX: APPLICATION OF WOUND VAC: SHX5189

## 2020-04-16 HISTORY — PX: I & D EXTREMITY: SHX5045

## 2020-04-16 LAB — CBC
HCT: 35.3 % — ABNORMAL LOW (ref 39.0–52.0)
Hemoglobin: 11.3 g/dL — ABNORMAL LOW (ref 13.0–17.0)
MCH: 30.7 pg (ref 26.0–34.0)
MCHC: 32 g/dL (ref 30.0–36.0)
MCV: 95.9 fL (ref 80.0–100.0)
Platelets: 336 10*3/uL (ref 150–400)
RBC: 3.68 MIL/uL — ABNORMAL LOW (ref 4.22–5.81)
RDW: 13.1 % (ref 11.5–15.5)
WBC: 8.3 10*3/uL (ref 4.0–10.5)
nRBC: 0 % (ref 0.0–0.2)

## 2020-04-16 LAB — BASIC METABOLIC PANEL
Anion gap: 9 (ref 5–15)
BUN: 17 mg/dL (ref 6–20)
CO2: 21 mmol/L — ABNORMAL LOW (ref 22–32)
Calcium: 9.2 mg/dL (ref 8.9–10.3)
Chloride: 103 mmol/L (ref 98–111)
Creatinine, Ser: 1.01 mg/dL (ref 0.61–1.24)
GFR, Estimated: >60 mL/min (ref >60)
Glucose, Bld: 187 mg/dL — ABNORMAL HIGH (ref 70–99)
Potassium: 4.8 mmol/L (ref 3.5–5.1)
Sodium: 133 mmol/L — ABNORMAL LOW (ref 135–145)

## 2020-04-16 LAB — GLUCOSE, CAPILLARY
Glucose-Capillary: 196 mg/dL — ABNORMAL HIGH (ref 70–99)
Glucose-Capillary: 187 mg/dL — ABNORMAL HIGH (ref 70–99)
Glucose-Capillary: 210 mg/dL — ABNORMAL HIGH (ref 70–99)
Glucose-Capillary: 205 mg/dL — ABNORMAL HIGH (ref 70–99)

## 2020-04-16 LAB — SARS CORONAVIRUS 2 BY RT PCR (HOSPITAL ORDER, PERFORMED IN ~~LOC~~ HOSPITAL LAB): SARS Coronavirus 2: NEGATIVE

## 2020-04-16 SURGERY — IRRIGATION AND DEBRIDEMENT EXTREMITY
Anesthesia: General | Site: Leg Lower | Laterality: Right

## 2020-04-16 MED ORDER — ONDANSETRON HCL 4 MG/2ML IJ SOLN: 4.0000 mg | Freq: Once | INTRAMUSCULAR | Status: DC | PRN

## 2020-04-16 MED ORDER — FENTANYL CITRATE (PF) 250 MCG/5ML IJ SOLN
INTRAMUSCULAR | Status: AC
  Filled 2020-04-16: qty 5

## 2020-04-16 MED ORDER — OXYCODONE HCL 5 MG PO TABS
5.0000 mg | ORAL_TABLET | Freq: Once | ORAL | Status: AC | PRN
Start: 2020-04-16 — End: 2020-04-16

## 2020-04-16 MED ORDER — SODIUM CHLORIDE 0.9 % IR SOLN
Status: DC | PRN
  Administered 2020-04-16: 3000 mL

## 2020-04-16 MED ORDER — OXYCODONE HCL 5 MG/5ML PO SOLN: 5.0000 mg | Freq: Once | ORAL | Status: AC | PRN

## 2020-04-16 MED ORDER — HEMOSTATIC AGENTS (NO CHARGE) OPTIME
TOPICAL | Status: DC | PRN
  Administered 2020-04-16 (×2): 1 via TOPICAL

## 2020-04-16 MED ORDER — OXYCODONE-ACETAMINOPHEN 5-325 MG PO TABS: 1.0000 | ORAL_TABLET | ORAL | 0 refills | Status: DC | PRN

## 2020-04-16 MED ORDER — CHLORHEXIDINE GLUCONATE 0.12 % MT SOLN: 15.0000 mL | Freq: Once | OROMUCOSAL | Status: AC

## 2020-04-16 MED ORDER — CHLORHEXIDINE GLUCONATE 4 % EX LIQD: 60.0000 mL | Freq: Once | CUTANEOUS | Status: DC

## 2020-04-16 MED ORDER — MIDAZOLAM HCL 5 MG/5ML IJ SOLN
INTRAMUSCULAR | Status: DC | PRN
  Administered 2020-04-16: 2 mg via INTRAVENOUS

## 2020-04-16 MED ORDER — DIPHENHYDRAMINE HCL 50 MG/ML IJ SOLN: 25.0000 mg | INTRAMUSCULAR | Status: AC

## 2020-04-16 MED ORDER — PHENYLEPHRINE 40 MCG/ML (10ML) SYRINGE FOR IV PUSH (FOR BLOOD PRESSURE SUPPORT)
PREFILLED_SYRINGE | INTRAVENOUS | Status: DC | PRN
  Administered 2020-04-16 (×2): 80 ug via INTRAVENOUS
  Administered 2020-04-16: 120 ug via INTRAVENOUS

## 2020-04-16 MED ORDER — 0.9 % SODIUM CHLORIDE (POUR BTL) OPTIME
TOPICAL | Status: DC | PRN
  Administered 2020-04-16: 1000 mL

## 2020-04-16 MED ORDER — ORAL CARE MOUTH RINSE: 15.0000 mL | Freq: Once | OROMUCOSAL | Status: AC

## 2020-04-16 MED ORDER — ONDANSETRON HCL 4 MG/2ML IJ SOLN
INTRAMUSCULAR | Status: DC | PRN
  Administered 2020-04-16: 4 mg via INTRAVENOUS

## 2020-04-16 MED ORDER — LIDOCAINE 2% (20 MG/ML) 5 ML SYRINGE
INTRAMUSCULAR | Status: DC | PRN
  Administered 2020-04-16: 50 mg via INTRAVENOUS

## 2020-04-16 MED ORDER — HYDROMORPHONE HCL 1 MG/ML IJ SOLN
INTRAMUSCULAR | Status: AC
  Administered 2020-04-16: 0.5 mg via INTRAVENOUS
  Filled 2020-04-16: qty 1

## 2020-04-16 MED ORDER — INSULIN ASPART 100 UNIT/ML ~~LOC~~ SOLN
SUBCUTANEOUS | Status: DC | PRN
  Administered 2020-04-16: 5 [IU] via SUBCUTANEOUS

## 2020-04-16 MED ORDER — VANCOMYCIN HCL 1500 MG/300ML IV SOLN
1500.0000 mg | INTRAVENOUS | Status: DC
  Filled 2020-04-16: qty 300

## 2020-04-16 MED ORDER — VANCOMYCIN HCL IN DEXTROSE 1-5 GM/200ML-% IV SOLN
INTRAVENOUS | Status: AC
  Administered 2020-04-16: 1000 mg
  Filled 2020-04-16: qty 200

## 2020-04-16 MED ORDER — MIDAZOLAM HCL 2 MG/2ML IJ SOLN
INTRAMUSCULAR | Status: AC
  Filled 2020-04-16: qty 2

## 2020-04-16 MED ORDER — PROPOFOL 10 MG/ML IV BOLUS
INTRAVENOUS | Status: DC | PRN
  Administered 2020-04-16: 150 mg via INTRAVENOUS

## 2020-04-16 MED ORDER — HYDROMORPHONE HCL 2 MG PO TABS: 2.0000 mg | ORAL_TABLET | Freq: Four times a day (QID) | ORAL | 0 refills | Status: AC | PRN

## 2020-04-16 MED ORDER — LACTATED RINGERS IV SOLN: INTRAVENOUS | Status: DC

## 2020-04-16 MED ORDER — HYDROMORPHONE HCL 1 MG/ML IJ SOLN
0.2500 mg | INTRAMUSCULAR | Status: DC | PRN
  Administered 2020-04-16: 0.5 mg via INTRAVENOUS

## 2020-04-16 MED ORDER — CHLORHEXIDINE GLUCONATE 0.12 % MT SOLN
OROMUCOSAL | Status: AC
  Administered 2020-04-16: 15 mL via OROMUCOSAL
  Filled 2020-04-16: qty 15

## 2020-04-16 MED ORDER — OXYCODONE HCL 5 MG PO TABS
ORAL_TABLET | ORAL | Status: AC
  Administered 2020-04-16: 5 mg via ORAL
  Filled 2020-04-16: qty 1

## 2020-04-16 MED ORDER — FENTANYL CITRATE (PF) 250 MCG/5ML IJ SOLN
INTRAMUSCULAR | Status: DC | PRN
  Administered 2020-04-16 (×4): 25 ug via INTRAVENOUS
  Administered 2020-04-16: 50 ug via INTRAVENOUS
  Administered 2020-04-16: 25 ug via INTRAVENOUS

## 2020-04-16 MED ORDER — PROPOFOL 10 MG/ML IV BOLUS
INTRAVENOUS | Status: AC
  Filled 2020-04-16: qty 20

## 2020-04-16 MED ORDER — DIPHENHYDRAMINE HCL 50 MG/ML IJ SOLN
INTRAMUSCULAR | Status: AC
  Administered 2020-04-16: 25 mg via INTRAVENOUS
  Filled 2020-04-16: qty 1

## 2020-04-16 MED ORDER — HYDROCORTISONE NA SUCCINATE PF 100 MG IJ SOLR
50.0000 mg | INTRAMUSCULAR | Status: AC
  Administered 2020-04-16: 50 mg via INTRAVENOUS
  Filled 2020-04-16: qty 1

## 2020-04-16 SURGICAL SUPPLY — 55 items
ADH SKN CLS APL DERMABOND .7 (GAUZE/BANDAGES/DRESSINGS)
AGENT HMST KT MTR STRL THRMB (HEMOSTASIS) ×1
BLADE SURG 10 STRL SS (BLADE) ×2 IMPLANT
BNDG ELASTIC 4X5.8 VLCR STR LF (GAUZE/BANDAGES/DRESSINGS) IMPLANT
BNDG ELASTIC 6X5.8 VLCR STR LF (GAUZE/BANDAGES/DRESSINGS) ×1 IMPLANT
BNDG GAUZE ELAST 4 BULKY (GAUZE/BANDAGES/DRESSINGS) ×1 IMPLANT
CANISTER PREVENA PLUS 150 (CANNISTER) ×1 IMPLANT
CANISTER SUCT 3000ML PPV (MISCELLANEOUS) ×2 IMPLANT
COVER SURGICAL LIGHT HANDLE (MISCELLANEOUS) ×4 IMPLANT
COVER WAND RF STERILE (DRAPES) ×1 IMPLANT
DERMABOND ADVANCED (GAUZE/BANDAGES/DRESSINGS)
DERMABOND ADVANCED .7 DNX12 (GAUZE/BANDAGES/DRESSINGS) ×1 IMPLANT
DRAPE INCISE IOBAN 66X45 STRL (DRAPES) ×1 IMPLANT
DRAPE ORTHO SPLIT 77X108 STRL (DRAPES) ×2
DRAPE SURG ORHT 6 SPLT 77X108 (DRAPES) ×1 IMPLANT
DRSG CUTIMED SORBACT 7X9 (GAUZE/BANDAGES/DRESSINGS) ×1 IMPLANT
DRSG VAC ATS LRG SENSATRAC (GAUZE/BANDAGES/DRESSINGS) ×2 IMPLANT
DRSG VAC ATS MED SENSATRAC (GAUZE/BANDAGES/DRESSINGS) IMPLANT
ELECT REM PT RETURN 9FT ADLT (ELECTROSURGICAL) ×2
ELECTRODE REM PT RTRN 9FT ADLT (ELECTROSURGICAL) ×1 IMPLANT
GAUZE SPONGE 4X4 12PLY STRL (GAUZE/BANDAGES/DRESSINGS) ×2 IMPLANT
GLOVE BIO SURGEON STRL SZ7.5 (GLOVE) ×2 IMPLANT
GLOVE SRG 8 PF TXTR STRL LF DI (GLOVE) ×1 IMPLANT
GLOVE SURG UNDER POLY LF SZ8 (GLOVE) ×2
GOWN STRL REUS W/ TWL LRG LVL3 (GOWN DISPOSABLE) ×2 IMPLANT
GOWN STRL REUS W/ TWL XL LVL3 (GOWN DISPOSABLE) ×2 IMPLANT
GOWN STRL REUS W/TWL LRG LVL3 (GOWN DISPOSABLE) ×4
GOWN STRL REUS W/TWL XL LVL3 (GOWN DISPOSABLE) ×4
GRAFT MYRIAD 3 LAYER 10X10 (Graft) ×1 IMPLANT
HANDPIECE INTERPULSE COAX TIP (DISPOSABLE) ×2
KIT BASIN OR (CUSTOM PROCEDURE TRAY) ×2 IMPLANT
KIT PREVENA INCISION MGT 13 (CANNISTER) ×1 IMPLANT
KIT TURNOVER KIT B (KITS) ×2 IMPLANT
NS IRRIG 1000ML POUR BTL (IV SOLUTION) ×2 IMPLANT
PACK CV ACCESS (CUSTOM PROCEDURE TRAY) ×2 IMPLANT
PACK GENERAL/GYN (CUSTOM PROCEDURE TRAY) ×2 IMPLANT
PAD ARMBOARD 7.5X6 YLW CONV (MISCELLANEOUS) ×4 IMPLANT
POWDER MYRIAD MORCELLS 1000MG (Miscellaneous) ×1 IMPLANT
POWDER SURGICEL 3.0 GRAM (HEMOSTASIS) ×1 IMPLANT
SET HNDPC FAN SPRY TIP SCT (DISPOSABLE) IMPLANT
SPONGE LAP 18X18 RF (DISPOSABLE) ×1 IMPLANT
STAPLER VISISTAT 35W (STAPLE) IMPLANT
SURGIFLO W/THROMBIN 8M KIT (HEMOSTASIS) ×1 IMPLANT
SURGILUBE 2OZ TUBE FLIPTOP (MISCELLANEOUS) ×1 IMPLANT
SUT ETHILON 2 0 PSLX (SUTURE) IMPLANT
SUT ETHILON 3 0 PS 1 (SUTURE) IMPLANT
SUT MNCRL AB 3-0 PS2 18 (SUTURE) ×4 IMPLANT
SUT MNCRL AB 4-0 PS2 18 (SUTURE) IMPLANT
SUT PROLENE 6 0 C 1 30 (SUTURE) ×2 IMPLANT
SUT VIC AB 2-0 CT1 27 (SUTURE)
SUT VIC AB 2-0 CT1 TAPERPNT 27 (SUTURE) IMPLANT
SUT VIC AB 3-0 SH 27 (SUTURE)
SUT VIC AB 3-0 SH 27X BRD (SUTURE) IMPLANT
TOWEL GREEN STERILE (TOWEL DISPOSABLE) ×2 IMPLANT
WATER STERILE IRR 1000ML POUR (IV SOLUTION) ×2 IMPLANT

## 2020-04-16 NOTE — Progress Notes (Signed)
IV tubing and LR bag replaced and fluid pulled back from IV line in order to clear all the vancomycin possible.

## 2020-04-16 NOTE — Op Note (Addendum)
    Patient name: Wayne Pratt MRN: [id number] DOB: February 08, 1960 Sex: male  04/16/2020 Pre-operative Diagnosis: Right calf wound Post-operative diagnosis:  Same Surgeon:  Annamarie Major Assistants: None Procedure:   #1: Hervey Ard debridement of skin, subcutaneous tissue, muscle and tendon of right calf ulcer   #2: Application of autologous skin substitute   #3: Placement of wound VAC Anesthesia: General Blood Loss: 100 cc Specimens: Anaerobic and aerobic cultures were sent  Findings: Large amount of necrotic tissue was excised.  The Achilles tendon was visible within the incision.  It appeared to be viable.  There was healthy bleeding following debridement  Indications: This is a 60 year old gentleman who has previously undergone right leg femoral endarterectomy with femoral to below-knee popliteal bypass graft with vein in January 2022.  He has also undergone partial first second and fifth toe amputations.  He has a residual calf wound that has not progressed and now has a foul odor with necrotic tissue.  He comes in today for debridement.  Procedure:  The patient was identified in the holding area and taken to [place] [facility]  The patient was then placed supine on the table. general anesthesia was administered.  The patient was prepped and draped in the usual sterile fashion.  A time out was called and antibiotics were administered.  Using a 10 blade, I sharply debrided all of the nonviable tissue.  I removed what appeared to be muscle with muscle tendon as well as subcutaneous tissue and fat.  I did have to open the ulcer inferiorly in order to excise all the necrotic tissue.  Once I got back to healthy tissue, I irrigated the wound with 3 L of saline using a Pulsavac.  I then obtained hemostasis with cautery and topical agents.  I then used the 10 x 10 Myriad graft and sutured this to the skin and then packed the $RemoveBe'100mg'[id number]$  Myriad powder into the wound and closed the graft over top of the wound with Monocryl  suture.  A nonadhesive layer was then sutured over top of the graft with Monocryl.  A Prevena wound VAC was then placed.  Dimensions of the wound were 8 x 4 x 3 cm.  The patient tolerated procedure well was successfully extubated taken recovery in stable condition.   Disposition: To PACU stable   V. Annamarie Major, M.D., [facility] [address] Office: [phone] Pager:  [phone]

## 2020-04-16 NOTE — Anesthesia Procedure Notes (Signed)
Procedure Name: LMA Insertion Date/Time: 04/16/2020 5:08 PM Performed by: Betha Loa, CRNA Pre-anesthesia Checklist: Patient identified, Emergency Drugs available, Suction available and Patient being monitored Patient Re-evaluated:Patient Re-evaluated prior to induction Oxygen Delivery Method: Circle System Utilized Preoxygenation: Pre-oxygenation with 100% oxygen Induction Type: IV induction Ventilation: Mask ventilation without difficulty LMA: LMA inserted LMA Size: 5.0 Number of attempts: 2 Placement Confirmation: positive ETCO2 and breath sounds checked- equal and bilateral Tube secured with: Tape Dental Injury: Teeth and Oropharynx as per pre-operative assessment

## 2020-04-16 NOTE — Anesthesia Postprocedure Evaluation (Signed)
Anesthesia Post Note  Patient: Wayne Pratt  Procedure(s) Performed: RIGHT CALF WOUND IRRIGATION, DEBRIDEMENT AND GRAFTING (Right Leg Lower) APPLICATION OF WOUND VAC (Right Leg Lower)     Patient location during evaluation: PACU Anesthesia Type: General Level of consciousness: awake Pain management: pain level controlled Vital Signs Assessment: post-procedure vital signs reviewed and stable Respiratory status: spontaneous breathing Cardiovascular status: stable Postop Assessment: no apparent nausea or vomiting Anesthetic complications: no   No complications documented.  Last Vitals:  Vitals:   04/16/20 1955 04/16/20 2005  BP: 124/83 136/89  Pulse: (!) 105 (!) 103  Resp: 15 10  Temp:  (!) 36.4 C  SpO2: 95% 94%    Last Pain:  Vitals:   04/16/20 2005  TempSrc:   PainSc: 4                  Terena Bohan

## 2020-04-16 NOTE — Progress Notes (Addendum)
At 1203 Pt called out stating his IV was burning. Upon arrival pt c/o chest tightness and heaviness, and itching. Pt placed on monitor, SPO2 in 80s. IV Vanc stopped. Pt placed on non-rebreather mask. Dr. Royce Macadamia notified. Verbal order received for 25 mg IV diphenhydrAMINE (BENADRYL) and 50 mg IV hydrocortisone sodium succinate (SOLU-CORTEF). Pt now reports no chest tightness, with chest heaviness dissipating. SPO2 now in 90s, now on 2L O2. Pt denies SHOB, lightheadedness, dizziness or CP.

## 2020-04-16 NOTE — Interval H&P Note (Signed)
History and Physical Interval Note:  04/16/2020 4:35 PM  Wayne Pratt  has presented today for surgery, with the diagnosis of RIGHT CALF WOUND.  The various methods of treatment have been discussed with the patient and family. After consideration of risks, benefits and other options for treatment, the patient has consented to  Procedure(s): RIGHT CALF WOUND IRRIGATION, DEBRIDEMENT AND GRAFTING (Right) APPLICATION OF WOUND VAC (Right) as a surgical intervention.  The patient's history has been reviewed, patient examined, no change in status, stable for surgery.  I have reviewed the patient's chart and labs.  Questions were answered to the patient's satisfaction.     Annamarie Major

## 2020-04-16 NOTE — Anesthesia Preprocedure Evaluation (Addendum)
Anesthesia Evaluation  Patient identified by MRN, date of birth, ID band Patient awake    Reviewed: Allergy & Precautions, NPO status , Patient's Chart, lab work & pertinent test results, reviewed documented beta blocker date and time   Airway Mallampati: I  TM Distance: >3 FB Neck ROM: Full    Dental  (+) Edentulous Upper, Edentulous Lower   Pulmonary asthma , sleep apnea , pneumonia, resolved, former smoker,    breath sounds clear to auscultation       Cardiovascular hypertension, Pt. on medications + Peripheral Vascular Disease   Rhythm:Regular Rate:Normal     Neuro/Psych  Headaches, Anxiety  Neuromuscular disease    GI/Hepatic negative GI ROS, Neg liver ROS,   Endo/Other  diabetes, Poorly Controlled, Type 2, Oral Hypoglycemic Agents  Renal/GU negative Renal ROS  negative genitourinary   Musculoskeletal  (+) Arthritis , Open wound right calf   Abdominal Normal abdominal exam  (+)   Peds  Hematology  (+) anemia ,   Anesthesia Other Findings   Reproductive/Obstetrics                             Anesthesia Physical  Anesthesia Plan  ASA: III  Anesthesia Plan: General   Post-op Pain Management:    Induction: Intravenous  PONV Risk Score and Plan: 3 and Ondansetron, Propofol infusion, Midazolam and Treatment may vary due to age or medical condition  Airway Management Planned: LMA and Oral ETT  Additional Equipment: None  Intra-op Plan:   Post-operative Plan: Extubation in OR  Informed Consent: I have reviewed the patients History and Physical, chart, labs and discussed the procedure including the risks, benefits and alternatives for the proposed anesthesia with the patient or authorized representative who has indicated his/her understanding and acceptance.       Plan Discussed with: CRNA  Anesthesia Plan Comments: (     )        Anesthesia Quick Evaluation

## 2020-04-16 NOTE — Transfer of Care (Signed)
Immediate Anesthesia Transfer of Care Note  Patient: Wayne Pratt  Procedure(s) Performed: RIGHT CALF WOUND IRRIGATION, DEBRIDEMENT AND GRAFTING (Right Leg Lower) APPLICATION OF WOUND VAC (Right Leg Lower)  Patient Location: PACU  Anesthesia Type:General  Level of Consciousness: awake, alert  and oriented  Airway & Oxygen Therapy: Patient Spontanous Breathing and Patient connected to nasal cannula oxygen  Post-op Assessment: Report given to RN and Post -op Vital signs reviewed and stable  Post vital signs: Reviewed and stable  Last Vitals:  Vitals Value Taken Time  BP 143/95 04/16/20 1850  Temp    Pulse 107 04/16/20 1854  Resp 14 04/16/20 1854  SpO2 97 % 04/16/20 1854  Vitals shown include unvalidated device data.  Last Pain:  Vitals:   04/16/20 1147  TempSrc:   PainSc: 10-Worst pain ever      Patients Stated Pain Goal: 4 (00/93/81 8299)  Complications: No complications documented.

## 2020-04-16 NOTE — Progress Notes (Signed)
patient taken off non rebreather and placed on 4L nasal cannula. O2 saturations at 94%. No itching, shortness of breath, chest pain per patient.

## 2020-04-17 ENCOUNTER — Encounter (HOSPITAL_COMMUNITY): Payer: Self-pay | Admitting: Surgery

## 2020-04-18 ENCOUNTER — Other Ambulatory Visit: Payer: Self-pay

## 2020-04-22 ENCOUNTER — Ambulatory Visit: Payer: PPO

## 2020-04-22 ENCOUNTER — Ambulatory Visit (INDEPENDENT_AMBULATORY_CARE_PROVIDER_SITE_OTHER): Payer: PPO | Admitting: Physician Assistant

## 2020-04-22 ENCOUNTER — Telehealth: Payer: Self-pay

## 2020-04-22 ENCOUNTER — Other Ambulatory Visit: Payer: Self-pay

## 2020-04-22 VITALS — BP 145/99 | HR 108 | Temp 98.4°F | Resp 20 | Ht 73.0 in | Wt 225.0 lb

## 2020-04-22 DIAGNOSIS — S81802D Unspecified open wound, left lower leg, subsequent encounter: Secondary | ICD-10-CM

## 2020-04-22 NOTE — Progress Notes (Signed)
POST OPERATIVE OFFICE NOTE    CC:  F/u for surgery  HPI:  This is a 60 y.o. male who has a history of chronic right calf wound.  He had previously undergone right leg femoral endarterectomy with femoral to below-knee popliteal bypass graft with vein in January 2022.  He has also undergone partial first second and fifth toe amputations.  He has a residual calf wound that has not progressed and now had a foul odor with necrotic tissue.    He is now s/p   #1: Sharp debridement of skin, subcutaneous tissue, muscle and tendon of right calf ulcer  #2: Application of autologous skin substitute  #3: Placement of wound VAC on 04/16/2020 by Dr. Trula Slade.   His wife accompanies him. He has a Prevena dressing in place.   Allergies  Allergen Reactions  . Penicillins Anaphylaxis and Other (See Comments)    Has patient had a PCN reaction causing immediate rash, facial/tongue/throat swelling, SOB or lightheadedness with hypotension: Yes Has patient had a PCN reaction causing severe rash involving mucus membranes or skin necrosis: Yes Has patient had a PCN reaction that required hospitalization: No Has patient had a PCN reaction occurring within the last 10 years: No If all of the above answers are "NO", then may proceed with Cephalosporin use.   . Vancomycin Anaphylaxis  . Ativan [Lorazepam] Other (See Comments)    Violent and Mean     Current Outpatient Medications  Medication Sig Dispense Refill  . acetaminophen (TYLENOL) 650 MG CR tablet Take 1,300 mg by mouth every 8 (eight) hours as needed for pain.    [name] Kitchen albuterol (VENTOLIN HFA) 108 (90 Base) MCG/ACT inhaler Inhale 1-2 puffs into the lungs every 6 (six) hours as needed for wheezing or shortness of breath. 18 g 0  . aspirin EC 81 MG EC tablet Take 1 tablet (81 mg total) by mouth daily at 6 (six) AM. Swallow whole. 30 tablet 11  . clonazePAM (KLONOPIN) 0.5 MG tablet Take 0.5 mg by mouth 2 (two) times daily.     . cyclobenzaprine (FLEXERIL) 10  MG tablet Take 10 mg by mouth 3 (three) times daily.    [name] Kitchen GLIPIZIDE XL 10 MG 24 hr tablet Take 10 mg by mouth 2 (two) times daily.    . IBU 800 MG tablet Take 800 mg by mouth every 8 (eight) hours as needed for moderate pain.    [name] Kitchen lisinopril (ZESTRIL) 10 MG tablet TAKE 1 TABLET BY MOUTH ONCE A DAY. (Patient taking differently: Take 10 mg by mouth daily.) 90 tablet 0  . lovastatin (MEVACOR) 40 MG tablet Take 40 mg by mouth at bedtime.     . Melatonin 10 MG TABS Take 40 mg by mouth at bedtime.    . metFORMIN (GLUCOPHAGE) 1000 MG tablet Take 1,000 mg by mouth 2 (two) times daily.     [name] Kitchen morphine (MSIR) 15 MG tablet Take 1 tablet by mouth every 4 (four) hours as needed for pain.    [name] Kitchen oxyCODONE-acetaminophen (PERCOCET) 5-325 MG tablet Take 1 tablet by mouth every 4 (four) hours as needed (breakthrough pain). 30 tablet 0  . pioglitazone (ACTOS) 15 MG tablet Take 15 mg by mouth at bedtime.    . sulfamethoxazole-trimethoprim (BACTRIM DS) 800-160 MG tablet Take 1 tablet by mouth 2 (two) times daily.     No current facility-administered medications for this visit.     ROS:  See HPI  BP (!) 145/99 (BP Location: Right Arm, Patient Position: Sitting,  Cuff Size: Large)   Pulse (!) 108   Temp 98.4 F (36.9 C) (Temporal)   Resp 20   Ht 6\' 1"  (1.854 m)   Wt 225 lb (102.1 kg)   SpO2 99%   BMI 29.69 kg/m   Physical Exam:  General appearance: No apparent distress Cardiac: Rate and rhythm are regular Respiratory: Nonlabored Extremities: Right lower extremity is removed.  There is foul odor.  Several Monocryl stitches were removed at the superior edge of the base of the nonadherent dressing.  This was carefully separated from the skin graft.  There is approximately 80% skin graft viability with a necrotic edge at the 4 to 5 o'clock position.  There was also some fibrinous exudate in the base of the wound at this position.  This was sharply debrided.  The skin graft was then replaced over the area of the  wound and the nonadhesive dressing was also replaced over the entirety of the skin graft.  Surgilube was applied and the wound dressed with dry dressings.  The patient had biphasic left posterior tibial and peroneal artery signals.  Monophasic DP signal.  His toe amputation sites have healed. Neuro: Alert and oriented x4   Assessment/Plan:  This is a 60 y.o. male who is s/p: Sharp debridement of right calf ulcer with placement of autologous skin graft.  He appears to have approximately 80% skin graft viability.  His wound measures approximately 8 cm x 5 cm x 2 cm.  We will make arrangements for home wound VAC to be delivered and placed to his right calf wound.  He will return to the clinic for wound VAC changes weekly. Dry dressing orders will be faxed to [facility] and wound VAC orders faxed to [facility].  Risa Grill, PA-C [facility] [phone]  Clinic MD:  Carlis Abbott

## 2020-04-22 NOTE — Telephone Encounter (Addendum)
Patient and wife came to the office today for a follow up visit.  Wife and PA were concerned that the Wound VAC was not received so that [facility] could be sent an order to appy it.  Per Dawn at [place] has been trying to contact [place] to get the authorization approved through his [employer].  "The number they had on file just rang and rang".  I gave Arrie Aran another number to have her try [phone]) and advised her to call me if that number did not work.   Per Katharine Look, [place], pt needs the small wound covered on the right lower leg.  184mm/hg.  Order should be sent to [facility] for [facility] placement.  If [name] finds that they cannot change the VAC once a week, patient is to come back to the office weekly  and [name] would just do the placement without weekly changes.   Patient's wife said they only answer their home phone number [phone].

## 2020-04-23 ENCOUNTER — Telehealth: Payer: Self-pay

## 2020-04-23 DIAGNOSIS — Z9682 Presence of neurostimulator: Secondary | ICD-10-CM | POA: Diagnosis not present

## 2020-04-23 DIAGNOSIS — E7800 Pure hypercholesterolemia, unspecified: Secondary | ICD-10-CM | POA: Diagnosis not present

## 2020-04-23 DIAGNOSIS — J45909 Unspecified asthma, uncomplicated: Secondary | ICD-10-CM | POA: Diagnosis not present

## 2020-04-23 DIAGNOSIS — M479 Spondylosis, unspecified: Secondary | ICD-10-CM | POA: Diagnosis not present

## 2020-04-23 DIAGNOSIS — Z7952 Long term (current) use of systemic steroids: Secondary | ICD-10-CM | POA: Diagnosis not present

## 2020-04-23 DIAGNOSIS — E785 Hyperlipidemia, unspecified: Secondary | ICD-10-CM | POA: Diagnosis not present

## 2020-04-23 DIAGNOSIS — Z85828 Personal history of other malignant neoplasm of skin: Secondary | ICD-10-CM | POA: Diagnosis not present

## 2020-04-23 DIAGNOSIS — G4730 Sleep apnea, unspecified: Secondary | ICD-10-CM | POA: Diagnosis not present

## 2020-04-23 DIAGNOSIS — Z89411 Acquired absence of right great toe: Secondary | ICD-10-CM | POA: Diagnosis not present

## 2020-04-23 DIAGNOSIS — Z8701 Personal history of pneumonia (recurrent): Secondary | ICD-10-CM | POA: Diagnosis not present

## 2020-04-23 DIAGNOSIS — Z7982 Long term (current) use of aspirin: Secondary | ICD-10-CM | POA: Diagnosis not present

## 2020-04-23 DIAGNOSIS — D649 Anemia, unspecified: Secondary | ICD-10-CM | POA: Diagnosis not present

## 2020-04-23 DIAGNOSIS — E1140 Type 2 diabetes mellitus with diabetic neuropathy, unspecified: Secondary | ICD-10-CM | POA: Diagnosis not present

## 2020-04-23 DIAGNOSIS — F419 Anxiety disorder, unspecified: Secondary | ICD-10-CM | POA: Diagnosis not present

## 2020-04-23 DIAGNOSIS — Z4781 Encounter for orthopedic aftercare following surgical amputation: Secondary | ICD-10-CM | POA: Diagnosis not present

## 2020-04-23 DIAGNOSIS — I10 Essential (primary) hypertension: Secondary | ICD-10-CM | POA: Diagnosis not present

## 2020-04-23 DIAGNOSIS — G43909 Migraine, unspecified, not intractable, without status migrainosus: Secondary | ICD-10-CM | POA: Diagnosis not present

## 2020-04-23 DIAGNOSIS — Z7984 Long term (current) use of oral hypoglycemic drugs: Secondary | ICD-10-CM | POA: Diagnosis not present

## 2020-04-23 DIAGNOSIS — Z9181 History of falling: Secondary | ICD-10-CM | POA: Diagnosis not present

## 2020-04-23 DIAGNOSIS — F17210 Nicotine dependence, cigarettes, uncomplicated: Secondary | ICD-10-CM | POA: Diagnosis not present

## 2020-04-23 DIAGNOSIS — E1151 Type 2 diabetes mellitus with diabetic peripheral angiopathy without gangrene: Secondary | ICD-10-CM | POA: Diagnosis not present

## 2020-04-23 DIAGNOSIS — Z48812 Encounter for surgical aftercare following surgery on the circulatory system: Secondary | ICD-10-CM | POA: Diagnosis not present

## 2020-04-23 DIAGNOSIS — G4700 Insomnia, unspecified: Secondary | ICD-10-CM | POA: Diagnosis not present

## 2020-04-23 NOTE — Telephone Encounter (Signed)
Patient actually has [employer].  Will follow up and Phone number will be given to [place], [phone], option 1.

## 2020-04-23 NOTE — Telephone Encounter (Signed)
Spoke with Judson Roch at [facility] to give verbal orders for daily dry dressing to R calf wound until wound vac arrives after wound vac is applied - vac to be changed weekly. Also confirmed with Dawn/[employer] rep, wound vac will arrive at pt's house today or tomorrow.

## 2020-04-24 ENCOUNTER — Other Ambulatory Visit: Payer: Self-pay | Admitting: Physician Assistant

## 2020-04-24 ENCOUNTER — Telehealth: Payer: Self-pay

## 2020-04-24 ENCOUNTER — Other Ambulatory Visit: Payer: Self-pay

## 2020-04-24 DIAGNOSIS — S81802D Unspecified open wound, left lower leg, subsequent encounter: Secondary | ICD-10-CM

## 2020-04-24 DIAGNOSIS — T8189XA Other complications of procedures, not elsewhere classified, initial encounter: Secondary | ICD-10-CM | POA: Diagnosis not present

## 2020-04-24 DIAGNOSIS — I70229 Atherosclerosis of native arteries of extremities with rest pain, unspecified extremity: Secondary | ICD-10-CM | POA: Diagnosis not present

## 2020-04-24 LAB — AEROBIC/ANAEROBIC CULTURE W GRAM STAIN (SURGICAL/DEEP WOUND)

## 2020-04-24 MED ORDER — LISINOPRIL 10 MG PO TABS: 1.0000 | ORAL_TABLET | Freq: Every day | ORAL | 0 refills | Status: DC

## 2020-04-24 MED ORDER — OXYCODONE-ACETAMINOPHEN 5-325 MG PO TABS: 1.0000 | ORAL_TABLET | ORAL | 0 refills | Status: DC | PRN

## 2020-04-24 NOTE — Telephone Encounter (Signed)
Patient's wife called to request pain med refill as he is still having a lot of pain s/p R leg surgeries and debridement. Per MD, pain medicine sent in to pharmacy - rescheduled patient appt to Monday so [facility] will be here.

## 2020-04-28 ENCOUNTER — Ambulatory Visit: Payer: PPO

## 2020-04-29 ENCOUNTER — Ambulatory Visit: Payer: PPO

## 2020-05-01 DIAGNOSIS — J45909 Unspecified asthma, uncomplicated: Secondary | ICD-10-CM | POA: Diagnosis not present

## 2020-05-01 DIAGNOSIS — Z7984 Long term (current) use of oral hypoglycemic drugs: Secondary | ICD-10-CM | POA: Diagnosis not present

## 2020-05-01 DIAGNOSIS — Z8701 Personal history of pneumonia (recurrent): Secondary | ICD-10-CM | POA: Diagnosis not present

## 2020-05-01 DIAGNOSIS — E785 Hyperlipidemia, unspecified: Secondary | ICD-10-CM | POA: Diagnosis not present

## 2020-05-01 DIAGNOSIS — T8189XD Other complications of procedures, not elsewhere classified, subsequent encounter: Secondary | ICD-10-CM | POA: Diagnosis not present

## 2020-05-01 DIAGNOSIS — E1140 Type 2 diabetes mellitus with diabetic neuropathy, unspecified: Secondary | ICD-10-CM | POA: Diagnosis not present

## 2020-05-01 DIAGNOSIS — E7800 Pure hypercholesterolemia, unspecified: Secondary | ICD-10-CM | POA: Diagnosis not present

## 2020-05-01 DIAGNOSIS — E1151 Type 2 diabetes mellitus with diabetic peripheral angiopathy without gangrene: Secondary | ICD-10-CM | POA: Diagnosis not present

## 2020-05-01 DIAGNOSIS — Z9181 History of falling: Secondary | ICD-10-CM | POA: Diagnosis not present

## 2020-05-01 DIAGNOSIS — I10 Essential (primary) hypertension: Secondary | ICD-10-CM | POA: Diagnosis not present

## 2020-05-01 DIAGNOSIS — G4730 Sleep apnea, unspecified: Secondary | ICD-10-CM | POA: Diagnosis not present

## 2020-05-01 DIAGNOSIS — M479 Spondylosis, unspecified: Secondary | ICD-10-CM | POA: Diagnosis not present

## 2020-05-01 DIAGNOSIS — G4700 Insomnia, unspecified: Secondary | ICD-10-CM | POA: Diagnosis not present

## 2020-05-01 DIAGNOSIS — Z7982 Long term (current) use of aspirin: Secondary | ICD-10-CM | POA: Diagnosis not present

## 2020-05-01 DIAGNOSIS — D649 Anemia, unspecified: Secondary | ICD-10-CM | POA: Diagnosis not present

## 2020-05-01 DIAGNOSIS — F17210 Nicotine dependence, cigarettes, uncomplicated: Secondary | ICD-10-CM | POA: Diagnosis not present

## 2020-05-01 DIAGNOSIS — G43909 Migraine, unspecified, not intractable, without status migrainosus: Secondary | ICD-10-CM | POA: Diagnosis not present

## 2020-05-01 DIAGNOSIS — F419 Anxiety disorder, unspecified: Secondary | ICD-10-CM | POA: Diagnosis not present

## 2020-05-01 DIAGNOSIS — Z85828 Personal history of other malignant neoplasm of skin: Secondary | ICD-10-CM | POA: Diagnosis not present

## 2020-05-01 DIAGNOSIS — Z9682 Presence of neurostimulator: Secondary | ICD-10-CM | POA: Diagnosis not present

## 2020-05-01 DIAGNOSIS — Z89411 Acquired absence of right great toe: Secondary | ICD-10-CM | POA: Diagnosis not present

## 2020-05-06 ENCOUNTER — Other Ambulatory Visit: Payer: Self-pay

## 2020-05-06 ENCOUNTER — Ambulatory Visit (INDEPENDENT_AMBULATORY_CARE_PROVIDER_SITE_OTHER): Payer: PPO | Admitting: Physician Assistant

## 2020-05-06 VITALS — BP 119/76 | HR 98 | Temp 98.3°F | Resp 20 | Ht 73.0 in | Wt 220.0 lb

## 2020-05-06 DIAGNOSIS — I70229 Atherosclerosis of native arteries of extremities with rest pain, unspecified extremity: Secondary | ICD-10-CM

## 2020-05-06 NOTE — Progress Notes (Signed)
POST OPERATIVE OFFICE NOTE    CC:  F/u for surgery  HPI:  This is a 60 y.o. male who who has a history of chronic right calf wound.  He had previously undergone right leg femoral endarterectomy with femoral to below-knee popliteal bypass graft with vein in January 2022 by Dr. Carlis Abbott. He has also undergone partial first second and fifth toe amputations and sharp debridement of right calf wound with vac placement on 02/29/2020 also by Dr. Carlis Abbott.   He is now s/p  Sharp debridement of skin, subcutaneous tissue, muscle and tendon of right calf ulcer, Application of autologous skin substitute and Placement of wound VACon 04/16/2020 by Dr. Trula Slade.   Pt was last seen on 04/22/2020 and at that time, he appeared to have ~ 80% skin graft viability and wound measured 8cm x 5cm x 2cm. There was a necrotic edge at the 4 to 5 o'clock position.  There was some fibrinous exudate in the base of the wound at this position and this was sharply debrided.  He had a monophasic DP doppler signal. Arrangements were being made for home vac to right calf and plans to return to clinic weekly for vac changes.   Pt returns today for follow up and accompanied by his wife.  Pt states he is having pain in his right heel  Allergies  Allergen Reactions  . Penicillins Anaphylaxis and Other (See Comments)    Has patient had a PCN reaction causing immediate rash, facial/tongue/throat swelling, SOB or lightheadedness with hypotension: Yes Has patient had a PCN reaction causing severe rash involving mucus membranes or skin necrosis: Yes Has patient had a PCN reaction that required hospitalization: No Has patient had a PCN reaction occurring within the last 10 years: No If all of the above answers are "NO", then may proceed with Cephalosporin use.   . Vancomycin Anaphylaxis  . Ativan [Lorazepam] Other (See Comments)    Violent and Mean     Current Outpatient Medications  Medication Sig Dispense Refill  . acetaminophen  (TYLENOL) 650 MG CR tablet Take 1,300 mg by mouth every 8 (eight) hours as needed for pain.    [name] Kitchen albuterol (VENTOLIN HFA) 108 (90 Base) MCG/ACT inhaler Inhale 1-2 puffs into the lungs every 6 (six) hours as needed for wheezing or shortness of breath. 18 g 0  . aspirin EC 81 MG EC tablet Take 1 tablet (81 mg total) by mouth daily at 6 (six) AM. Swallow whole. 30 tablet 11  . clonazePAM (KLONOPIN) 0.5 MG tablet Take 0.5 mg by mouth 2 (two) times daily.     . cyclobenzaprine (FLEXERIL) 10 MG tablet Take 10 mg by mouth 3 (three) times daily.    [name] Kitchen GLIPIZIDE XL 10 MG 24 hr tablet Take 10 mg by mouth 2 (two) times daily.    . IBU 800 MG tablet Take 800 mg by mouth every 8 (eight) hours as needed for moderate pain.    [name] Kitchen lisinopril (ZESTRIL) 10 MG tablet Take 1 tablet (10 mg total) by mouth daily. Please call to schedule appointment for further refills. Thanks! 30 tablet 0  . lovastatin (MEVACOR) 40 MG tablet Take 40 mg by mouth at bedtime.     . Melatonin 10 MG TABS Take 40 mg by mouth at bedtime.    . metFORMIN (GLUCOPHAGE) 1000 MG tablet Take 1,000 mg by mouth 2 (two) times daily.     [name] Kitchen morphine (MSIR) 15 MG tablet Take 1 tablet by mouth every 4 (four) hours  as needed for pain.    [name] Kitchen oxyCODONE-acetaminophen (PERCOCET) 5-325 MG tablet Take 1 tablet by mouth every 4 (four) hours as needed (breakthrough pain). 20 tablet 0  . pioglitazone (ACTOS) 15 MG tablet Take 15 mg by mouth at bedtime.    . sulfamethoxazole-trimethoprim (BACTRIM DS) 800-160 MG tablet Take 1 tablet by mouth 2 (two) times daily.     No current facility-administered medications for this visit.     ROS:  See HPI  Physical Exam:  Today's Vitals   05/06/20 1203  BP: 119/76  Pulse: 98  Resp: 20  Temp: 98.3 F (36.8 C)  TempSrc: Temporal  SpO2: 98%  Weight: 220 lb (99.8 kg)  Height: 6\' 1"  (1.854 m)  PainSc: 9    Body mass index is 29.03 kg/m.   Incision:      Extremities:  Monophasic right DP/PT doppler signals; right  heel with small area of mild redness but no wound.     Assessment/Plan:  This is a 60 y.o. male who is s/p: right leg femoral endarterectomy with femoral to below-knee popliteal bypass graft with vein in January 2022 by Dr. Carlis Abbott. He has also undergone partial first second and fifth toe amputations and sharp debridement of right calf wound with vac placement on 02/29/2020 also by Dr. Carlis Abbott.   He is now s/p  Sharp debridement of skin, subcutaneous tissue, muscle and tendon of right calf ulcer, Application of autologous skin substitute and Placement of wound VACon 04/16/2020 by Dr. Trula Slade.    -pt with brisk monophasic DP/PT doppler signals -wound vac removed (pt tolerated well) and wound inspected (see picture above).  Wound is improving.  He did not bring his vac supplies today.  Green layer placed back over the wound followed by hyydrogel, wet to dry dressings.  Used kerlix and ace to give his skin a break from the adhesive.  HH will not be back out until Thursday.  His wife will do wet to dry dressings tomorrow and she was instructed how to do this.   -he does c/o right heel pain.  There is no wound present.  Will have him float his heel to alleviate any pressure to help prevent pressure sore.   -he will come back in next Tuesday for wound check.  He was advised to bring his vac supplies and we will replace it at that time.    Leontine Locket, [facility] [phone]   Clinic MD:  Carlis Abbott

## 2020-05-07 ENCOUNTER — Telehealth: Payer: Self-pay

## 2020-05-07 NOTE — Telephone Encounter (Signed)
Patient's wife called to report that patient is in pain from a spot behind his leg to the ankle bone. She left a VM that "no one took the pain concerns seriously" when they were in the office yesterday and they were up all night unable to get the pain under control. Advised her that he got 180 morphine per month and we could not give him anymore. She states "yeah, but the morphine is for his neuropathy not this." Explained that we could not give anymore; it was illegal. Told her to speak to the pain management clinic and she stated "why would I do that? He is not under their care for this." Advised her I was very sorry, but could not give him anymore pain medicine. She hung up.

## 2020-05-13 ENCOUNTER — Encounter: Payer: Self-pay | Admitting: Physician Assistant

## 2020-05-13 ENCOUNTER — Telehealth: Payer: Self-pay | Admitting: *Deleted

## 2020-05-13 ENCOUNTER — Other Ambulatory Visit: Payer: Self-pay

## 2020-05-13 ENCOUNTER — Ambulatory Visit (INDEPENDENT_AMBULATORY_CARE_PROVIDER_SITE_OTHER): Payer: PPO | Admitting: Physician Assistant

## 2020-05-13 VITALS — BP 147/77 | HR 83 | Temp 97.9°F | Resp 20 | Ht 73.0 in | Wt 230.6 lb

## 2020-05-13 DIAGNOSIS — I70229 Atherosclerosis of native arteries of extremities with rest pain, unspecified extremity: Secondary | ICD-10-CM

## 2020-05-13 NOTE — Telephone Encounter (Signed)
pts wife reported leg swelling and pain after getting home from dressing change appt today. Right leg has doubled in size and patient reports 20/10 pain in calf wound down to the foot. Have scheduled pt for appt tomorrow. Patient will go to the [facility] ED if pain becomes unmanageable or swelling increases.

## 2020-05-13 NOTE — Progress Notes (Signed)
    Postoperative Access Visit   History of Present Illness   Wayne Pratt is a 60 y.o. year old male who presents for postoperative follow-up for wound check.  He had previously undergone right leg femoral endarterectomy with femoral to below the knee popliteal bypass with vein in January by Dr. Carlis Abbott.  He also underwent partial first, second, and fifth toe amputations with debridement of calf wound on 02/29/2020 by Dr. Carlis Abbott.  Subsequently on 04/16/2020 he had a skin substitute and wound VAC placement on calf wound.  He denies any rest pain in his right foot.  Patient and his wife believe the wound appears to be healing well.   Physical Examination   Vitals:   05/13/20 1200  BP: (!) 147/77  Pulse: 83  Resp: 20  Temp: 97.9 F (36.6 C)  TempSrc: Temporal  SpO2: 99%  Weight: 230 lb 9.6 oz (104.6 kg)  Height: $Remove'6\' 1"'fiJOeuE$  (1.854 m)   Body mass index is 30.42 kg/m.  right leg  foot is warm and well-perfused; partial toe amputation sites are completely healed; calf wound is pictured below       Medical Decision Making   Wayne Pratt is a 60 y.o. year old male who presents for wound check after Myriad skin substitute placement   Wound VAC changed in office today; 1 piece of endoform placed on proximal half of wound  Patient will follow up in 2 weeks for recheck  Home health will continue wound VAC changes at home in the meantime; we also discussed coordinating an additional VAC change if drainage worsens however patient and wife do not believe that this is necessary at this time   Dagoberto Ligas PA-C [facility] [place] Office: [facility] MD: Carlis Abbott

## 2020-05-14 ENCOUNTER — Ambulatory Visit: Payer: PPO

## 2020-05-15 ENCOUNTER — Telehealth: Payer: Self-pay

## 2020-05-15 NOTE — Telephone Encounter (Signed)
Patient's wife called to report patient is still in a lot of pain. Does not want to go to [facility] because his wife cannot accompany him. Told her I think that [facility] allows one visitor.  They may go to [facility] hospital.

## 2020-05-16 DIAGNOSIS — E7800 Pure hypercholesterolemia, unspecified: Secondary | ICD-10-CM | POA: Diagnosis not present

## 2020-05-16 DIAGNOSIS — F419 Anxiety disorder, unspecified: Secondary | ICD-10-CM | POA: Diagnosis not present

## 2020-05-16 DIAGNOSIS — Z85828 Personal history of other malignant neoplasm of skin: Secondary | ICD-10-CM | POA: Diagnosis not present

## 2020-05-16 DIAGNOSIS — G4700 Insomnia, unspecified: Secondary | ICD-10-CM | POA: Diagnosis not present

## 2020-05-16 DIAGNOSIS — F17210 Nicotine dependence, cigarettes, uncomplicated: Secondary | ICD-10-CM | POA: Diagnosis not present

## 2020-05-16 DIAGNOSIS — M479 Spondylosis, unspecified: Secondary | ICD-10-CM | POA: Diagnosis not present

## 2020-05-16 DIAGNOSIS — Z7982 Long term (current) use of aspirin: Secondary | ICD-10-CM | POA: Diagnosis not present

## 2020-05-16 DIAGNOSIS — J45909 Unspecified asthma, uncomplicated: Secondary | ICD-10-CM | POA: Diagnosis not present

## 2020-05-16 DIAGNOSIS — G43909 Migraine, unspecified, not intractable, without status migrainosus: Secondary | ICD-10-CM | POA: Diagnosis not present

## 2020-05-16 DIAGNOSIS — Z8701 Personal history of pneumonia (recurrent): Secondary | ICD-10-CM | POA: Diagnosis not present

## 2020-05-16 DIAGNOSIS — T8189XD Other complications of procedures, not elsewhere classified, subsequent encounter: Secondary | ICD-10-CM | POA: Diagnosis not present

## 2020-05-16 DIAGNOSIS — E785 Hyperlipidemia, unspecified: Secondary | ICD-10-CM | POA: Diagnosis not present

## 2020-05-16 DIAGNOSIS — E1140 Type 2 diabetes mellitus with diabetic neuropathy, unspecified: Secondary | ICD-10-CM | POA: Diagnosis not present

## 2020-05-16 DIAGNOSIS — D649 Anemia, unspecified: Secondary | ICD-10-CM | POA: Diagnosis not present

## 2020-05-16 DIAGNOSIS — Z7984 Long term (current) use of oral hypoglycemic drugs: Secondary | ICD-10-CM | POA: Diagnosis not present

## 2020-05-16 DIAGNOSIS — E1151 Type 2 diabetes mellitus with diabetic peripheral angiopathy without gangrene: Secondary | ICD-10-CM | POA: Diagnosis not present

## 2020-05-16 DIAGNOSIS — G4730 Sleep apnea, unspecified: Secondary | ICD-10-CM | POA: Diagnosis not present

## 2020-05-16 DIAGNOSIS — Z9682 Presence of neurostimulator: Secondary | ICD-10-CM | POA: Diagnosis not present

## 2020-05-16 DIAGNOSIS — Z89411 Acquired absence of right great toe: Secondary | ICD-10-CM | POA: Diagnosis not present

## 2020-05-16 DIAGNOSIS — I10 Essential (primary) hypertension: Secondary | ICD-10-CM | POA: Diagnosis not present

## 2020-05-16 DIAGNOSIS — Z9181 History of falling: Secondary | ICD-10-CM | POA: Diagnosis not present

## 2020-05-21 ENCOUNTER — Other Ambulatory Visit: Payer: Self-pay

## 2020-05-21 ENCOUNTER — Ambulatory Visit (INDEPENDENT_AMBULATORY_CARE_PROVIDER_SITE_OTHER): Payer: PPO | Admitting: Physician Assistant

## 2020-05-21 VITALS — BP 134/87 | HR 87 | Temp 98.6°F | Resp 20 | Ht 73.0 in | Wt 230.6 lb

## 2020-05-21 DIAGNOSIS — S81802D Unspecified open wound, left lower leg, subsequent encounter: Secondary | ICD-10-CM

## 2020-05-21 NOTE — Progress Notes (Addendum)
POST OPERATIVE OFFICE NOTE    CC:  F/u for surgery  HPI:  This is a 60 y.o. male who is s/p  who presents for postoperative follow-up for wound check. He telephoned the office with complaints of purulence and pain. He was assessed one week ago with improvement in wound and placement of small section of Endoform. His wife noticed several pustular areas underlying the wound VAC drape of the posterior calf.  He denies fever or chills.  He had previously undergone right leg femoral endarterectomy with femoral to below the knee popliteal bypass with vein in January by Dr. Carlis Abbott.  He also underwent partial first, second, and fifth toe amputations with debridement of calf wound on 02/29/2020 by Dr. Carlis Abbott.  Subsequently on 04/16/2020 he had a skin substitute and wound VAC placement on calf wound.    Allergies  Allergen Reactions  . Penicillins Anaphylaxis and Other (See Comments)    Has patient had a PCN reaction causing immediate rash, facial/tongue/throat swelling, SOB or lightheadedness with hypotension: Yes Has patient had a PCN reaction causing severe rash involving mucus membranes or skin necrosis: Yes Has patient had a PCN reaction that required hospitalization: No Has patient had a PCN reaction occurring within the last 10 years: No If all of the above answers are "NO", then may proceed with Cephalosporin use.   . Vancomycin Anaphylaxis  . Ativan [Lorazepam] Other (See Comments)    Violent and Mean     Current Outpatient Medications  Medication Sig Dispense Refill  . acetaminophen (TYLENOL) 650 MG CR tablet Take 1,300 mg by mouth every 8 (eight) hours as needed for pain.    [name] Kitchen albuterol (VENTOLIN HFA) 108 (90 Base) MCG/ACT inhaler Inhale 1-2 puffs into the lungs every 6 (six) hours as needed for wheezing or shortness of breath. 18 g 0  . aspirin EC 81 MG EC tablet Take 1 tablet (81 mg total) by mouth daily at 6 (six) AM. Swallow whole. 30 tablet 11  . clonazePAM (KLONOPIN) 0.5 MG tablet  Take 0.5 mg by mouth 2 (two) times daily.     . cyclobenzaprine (FLEXERIL) 10 MG tablet Take 10 mg by mouth 3 (three) times daily.    [name] Kitchen GLIPIZIDE XL 10 MG 24 hr tablet Take 10 mg by mouth 2 (two) times daily.    . IBU 800 MG tablet Take 800 mg by mouth every 8 (eight) hours as needed for moderate pain.    [name] Kitchen lisinopril (ZESTRIL) 10 MG tablet Take 1 tablet (10 mg total) by mouth daily. Please call to schedule appointment for further refills. Thanks! 30 tablet 0  . lovastatin (MEVACOR) 40 MG tablet Take 40 mg by mouth at bedtime.     . Melatonin 10 MG TABS Take 40 mg by mouth at bedtime.    . metFORMIN (GLUCOPHAGE) 1000 MG tablet Take 1,000 mg by mouth 2 (two) times daily.     [name] Kitchen morphine (MSIR) 15 MG tablet Take 1 tablet by mouth every 4 (four) hours as needed for pain.    . pioglitazone (ACTOS) 15 MG tablet Take 15 mg by mouth at bedtime.     No current facility-administered medications for this visit.     ROS:  See HPI Vitals:   05/21/20 1127  Weight: 230 lb 9.6 oz (104.6 kg)  Height: _0  (1.854 m)    Physical Exam:  General appearance: in NAD Cardiac:RRR Respiratory: non-labored Extremities:  RLE; mild edema of the right lower leg. 2+ right PT pulse.  Wound VAC removed and green nonadhesive layer folded back.  There is pinpoint bleeding along the periphery of the wound bed.  The wound was irrigated with sterile normal saline and gently probed with cotton-tipped applicator.  No purulence.  Foul odor noted.  Green layer replaced, hydrogel layer applied and skin prep applied to periwound skin.  Wound VAC dressing replaced with good seal.  Antibiotic ointment applied to areas of broken skin.  Left calf: 8 x 5.5 cm     Assessment/Plan:  This is a 60 y.o. male who is s/p: Wound debridement and Myriad skin substitute placement secondary to non-healing left posterior calf ulcer.  He has evidence of contact dermatitis of the periwound skin and several small areas of folliculitis.  No  evidence of cellulitis.  Recommend avoiding wound VAC drape to these small areas and applying Cavilon skin prep. Apply antibiotic ointment daily.  He has appointment arranged for next Tuesday.  Risa Grill, PA-C [facility] [phone]  Clinic MD:  Dr. Scot Dock

## 2020-05-22 ENCOUNTER — Encounter (HOSPITAL_COMMUNITY): Payer: Self-pay | Admitting: Emergency Medicine

## 2020-05-22 ENCOUNTER — Other Ambulatory Visit: Payer: Self-pay

## 2020-05-22 ENCOUNTER — Emergency Department (HOSPITAL_COMMUNITY)
Admission: EM | Admit: 2020-05-22 | Discharge: 2020-05-23 | Disposition: A | Discharge status: Home / Self Care | Payer: PPO | Attending: Emergency Medicine | Admitting: Emergency Medicine

## 2020-05-22 ENCOUNTER — Telehealth: Payer: Self-pay | Admitting: Vascular Surgery

## 2020-05-22 DIAGNOSIS — Z5189 Encounter for other specified aftercare: Secondary | ICD-10-CM

## 2020-05-22 DIAGNOSIS — Z7984 Long term (current) use of oral hypoglycemic drugs: Secondary | ICD-10-CM | POA: Insufficient documentation

## 2020-05-22 DIAGNOSIS — J45909 Unspecified asthma, uncomplicated: Secondary | ICD-10-CM | POA: Diagnosis not present

## 2020-05-22 DIAGNOSIS — I10 Essential (primary) hypertension: Secondary | ICD-10-CM | POA: Insufficient documentation

## 2020-05-22 DIAGNOSIS — Z87891 Personal history of nicotine dependence: Secondary | ICD-10-CM | POA: Diagnosis not present

## 2020-05-22 DIAGNOSIS — Z79899 Other long term (current) drug therapy: Secondary | ICD-10-CM | POA: Diagnosis not present

## 2020-05-22 DIAGNOSIS — Z4800 Encounter for change or removal of nonsurgical wound dressing: Secondary | ICD-10-CM | POA: Diagnosis not present

## 2020-05-22 DIAGNOSIS — Z85828 Personal history of other malignant neoplasm of skin: Secondary | ICD-10-CM | POA: Diagnosis not present

## 2020-05-22 DIAGNOSIS — E119 Type 2 diabetes mellitus without complications: Secondary | ICD-10-CM | POA: Insufficient documentation

## 2020-05-22 DIAGNOSIS — M79604 Pain in right leg: Secondary | ICD-10-CM | POA: Diagnosis not present

## 2020-05-22 DIAGNOSIS — Z7982 Long term (current) use of aspirin: Secondary | ICD-10-CM | POA: Insufficient documentation

## 2020-05-22 NOTE — ED Triage Notes (Signed)
Pt has a wound vac on his right calf since January. Pt states it feels like "it is ripping my leg." pt states the surgeon told him to come to the ED

## 2020-05-22 NOTE — Telephone Encounter (Signed)
Received phone call from patient's wife via answering service. Patient is in severe pain. Oral pain medicine not helping. Patient's wife describes redness about chronic calf wound. She also reports white discoloration about the wound. On review of chart, this appears consistent with office evaluation yesterday. I advised that presenting to local ER is best option if pain is unbearable.   Wayne Pratt. Stanford Breed, MD [facility] Phone Number: [phone] 05/22/2020 10:29 PM

## 2020-05-23 ENCOUNTER — Telehealth: Payer: Self-pay

## 2020-05-23 DIAGNOSIS — M79604 Pain in right leg: Secondary | ICD-10-CM | POA: Diagnosis not present

## 2020-05-23 MED ORDER — HYDROMORPHONE HCL 1 MG/ML IJ SOLN
1.0000 mg | Freq: Once | INTRAMUSCULAR | Status: AC
  Administered 2020-05-23: 1 mg via INTRAVENOUS
  Filled 2020-05-23: qty 1

## 2020-05-23 NOTE — ED Provider Notes (Signed)
[facility] EMERGENCY DEPARTMENT Provider Note   CSN: [id number] Arrival date & time: 05/22/20  2317     History Chief Complaint  Patient presents with  . Wound Check    Wayne Pratt is a 60 y.o. male.  Patient is a 60 year old male with past medical history of diabetes, peripheral vascular disease.  Patient had revascularization procedure to his right leg in January performed by vascular surgery.  Shortly afterward, he developed a sore to the back of the leg that has been attended to by vascular surgery and home health care who has been changing his dressings.  He has a wound VAC in place.  Tonight the patient's pain became significantly worse in the absence of injury or trauma.  The wife called the vascular surgeon on-call who instructed them to go to the nearest ER.  Patient seen yesterday in the vascular surgery clinic and had the wound VAC and dressing changed.  The history is provided by the patient.       Past Medical History:  Diagnosis Date  . Anemia    takes Iron pill daily  . Anxiety    takes Xanax daily, 02/28/20- no longer  . Arthritis   . Asthma   . Cancer ([place])    skin   . Diabetes mellitus ([place])    takes Metformin daily  . Headache    migraine  - not lately  . History of bronchitis    mid 90's  . History of hypertension    lost weight and been off meds over a yr  . History of kidney stones    Passed 32  . History of pleural empyema   . Hypercholesterolemia    lost weight and been off meds over a yr  . Hypertension   . Insomnia    takes Trazodone nightly  . Neuropathy   . Pneumonia    hx of-15+ yrs ago  . Sleep apnea    Stop Bang score of 7  . Weakness    numbness and tingling    Patient Active Problem List   Diagnosis Date Noted  . Gangrene of toe ([place]) 02/29/2020  . Ischemic rest pain of lower extremity 02/04/2020  . Critical lower limb ischemia ([place]) 01/29/2020  . Hypertension 01/25/2020  . Radiculopathy, lumbar region 01/15/2020  .  Battery end of life of spinal cord stimulator 08/23/2019  . Body mass index (BMI) 31.0-31.9, adult 08/09/2019  . Neck pain 08/09/2019  . Spinal stenosis, cervical region 08/09/2019  . Crushing injury of finger 05/15/2019  . Neuropraxia of upper extremity, left, initial encounter 05/15/2019  . Abdominal pain, epigastric 06/20/2017  . Dilation of pancreatic duct 06/20/2017  . S/P insertion of spinal cord stimulator 08/01/2015  . Neuropathic pain 06/16/2015  . Rectal bleeding 07/17/2014  . Anemia 07/17/2014  . Fatty liver 09/07/2011  . Diabetes mellitus ([place]) 09/07/2011  . Encounter for screening colonoscopy 09/07/2011  . Chronic pain 09/07/2011    Past Surgical History:  Procedure Laterality Date  . ABDOMINAL AORTOGRAM W/LOWER EXTREMITY N/A 01/31/2020   Procedure: ABDOMINAL AORTOGRAM W/LOWER EXTREMITY;  Surgeon: Marty Heck, MD;  Location: [facility];  Service: Cardiovascular;  Laterality: N/A;  . AMPUTATION Right 02/29/2020   Procedure: RIGHT TOE AMPUTATIONS, 1, 2, 5;  Surgeon: Marty Heck, MD;  Location: [place];  Service: Vascular;  Laterality: Right;  . APPLICATION OF WOUND VAC Right 02/29/2020   Procedure: APPLICATION OF WOUND VAC;  Surgeon: Marty Heck, MD;  [facility];  Service: Vascular;  Laterality: Right;  . APPLICATION OF WOUND VAC Right 04/16/2020   Procedure: APPLICATION OF WOUND VAC;  Surgeon: Serafina Mitchell, MD;  Location: [place];  Service: Vascular;  Laterality: Right;  . BIOPSY  10/05/2011   Dr. Oneida Alar :non-erosive gastritis  . BIOPSY  08/06/2014   Procedure: BIOPSY;  Surgeon: Danie Binder, MD;  Location: [facility];  Service: Endoscopy;;  . CERVICAL DISC SURGERY    . ELBOW SURGERY Right   . ENDARTERECTOMY FEMORAL Right 02/04/2020   Procedure: RIGHT FEMORAL ENDARTERECTOMY with Profundaplasty;  Surgeon: Marty Heck, MD;  Location: [place];  Service: Vascular;  Laterality: Right;  . ESOPHAGOGASTRODUODENOSCOPY  Sept 2013   Dr.  Oneida Alar: chronic inactive gastritis   . ESOPHAGOGASTRODUODENOSCOPY N/A 08/18/2017   Procedure: ESOPHAGOGASTRODUODENOSCOPY (EGD);  Surgeon: Milus Banister, MD;  Location[facility];  Service: Endoscopy;  Laterality: N/A;  . ESOPHAGOGASTRODUODENOSCOPY N/A 03/09/2018   Procedure: ESOPHAGOGASTRODUODENOSCOPY (EGD);  Surgeon: Milus Banister, MD;  Location: [facility];  Service: Endoscopy;  Laterality: N/A;  . ESOPHAGOGASTRODUODENOSCOPY (EGD) WITH PROPOFOL N/A 08/06/2014   mild non-erosive gastritis, duodenitis  . EUS N/A 08/18/2017   Procedure: UPPER ENDOSCOPIC ULTRASOUND (EUS) RADIAL;  Surgeon: Milus Banister, MD;  [facility];  Service: Endoscopy;  Laterality: N/A;  . EUS N/A 03/09/2018   Procedure: UPPER ENDOSCOPIC ULTRASOUND (EUS) RADIAL;  Surgeon: Milus Banister, MD;  [facility];  Service: Endoscopy;  Laterality: N/A;  . FEMORAL-POPLITEAL BYPASS GRAFT Right 02/04/2020   Procedure: BYPASS GRAFT FEMORAL-POPLITEAL ARTERY With Vein Harvest;  Surgeon: Marty Heck, MD;  Location: [place];  Service: Vascular;  Laterality: Right;  . FINE NEEDLE ASPIRATION  03/09/2018   Procedure: FINE NEEDLE ASPIRATION;  Surgeon: Milus Banister, MD;  [facility];  Service: Endoscopy;;  . FLEXIBLE SIGMOIDOSCOPY  10/05/2011   Dr. Fields:poor prep  . I & D EXTREMITY Right 04/16/2020   Procedure: RIGHT CALF WOUND IRRIGATION, DEBRIDEMENT AND GRAFTING;  Surgeon: Serafina Mitchell, MD;  Location: [place];  Service: Vascular;  Laterality: Right;  . LUNG SURGERY  2010   for empyema  . PATCH ANGIOPLASTY Right 02/04/2020   Procedure: PATCH ANGIOPLASTY;  Surgeon: Marty Heck, MD;  Location: [facility];  Service: Vascular;  Laterality: Right;  . SPINAL CORD STIMULATOR INSERTION N/A 08/01/2015   Procedure: Spinal Cord Stimulator Implant;  Surgeon: Eustace Moore, MD;  Location: [place] [facility];  Service: Neurosurgery;  Laterality: N/A;  . TONSILLECTOMY    . VASECTOMY Bilateral 1995  . VEIN  HARVEST Right 02/04/2020   Procedure: VEIN HARVEST Greater Saphenous Vein;  Surgeon: Marty Heck, MD;  Location: [place];  Service: Vascular;  Laterality: Right;  . WOUND DEBRIDEMENT Right 02/29/2020   Procedure: RIGHT CALF DEBRIDEMENT;  Surgeon: Marty Heck, MD;  Location: [facility];  Service: Vascular;  Laterality: Right;       Family History  Problem Relation Age of Onset  . Prostate cancer Other        all male members on mother's side  . Heart attack Father   . COPD Maternal Grandmother   . Cancer Paternal Grandmother   . Colon cancer Neg Hx     Social History   Tobacco Use  . Smoking status: Former Smoker    Packs/day: 1.00    Years: 39.00    Pack years: 39.00    Types: Cigarettes    Quit date: 02/04/2020    Years  since quitting: 0.2  . Smokeless tobacco: Never Used  Vaping Use  . Vaping Use: Never used  Substance Use Topics  . Alcohol use: No    Alcohol/week: 0.0 standard drinks  . Drug use: No    Home Medications Prior to Admission medications   Medication Sig Start Date End Date Taking? Authorizing Provider  acetaminophen (TYLENOL) 650 MG CR tablet Take 1,300 mg by mouth every 8 (eight) hours as needed for pain.    [provider]  albuterol (VENTOLIN HFA) 108 (90 Base) MCG/ACT inhaler Inhale 1-2 puffs into the lungs every 6 (six) hours as needed for wheezing or shortness of breath. 03/15/19   Avegno, Darrelyn Hillock, FNP  aspirin EC 81 MG EC tablet Take 1 tablet (81 mg total) by mouth daily at 6 (six) AM. Swallow whole. 02/08/20   Dagoberto Ligas, PA-C  clonazePAM (KLONOPIN) 0.5 MG tablet Take 0.5 mg by mouth 2 (two) times daily.  06/17/17   [provider]  cyclobenzaprine (FLEXERIL) 10 MG tablet Take 10 mg by mouth 3 (three) times daily. 04/14/17   [provider]  GLIPIZIDE XL 10 MG 24 hr tablet Take 10 mg by mouth 2 (two) times daily. 01/17/19   [provider]  IBU 800 MG tablet Take 800 mg by mouth every 8 (eight) hours as  needed for moderate pain. 01/20/18   [provider]  lisinopril (ZESTRIL) 10 MG tablet Take 1 tablet (10 mg total) by mouth daily. Please call to schedule appointment for further refills. Thanks! 04/24/20   Donato Heinz, MD  lovastatin (MEVACOR) 40 MG tablet Take 40 mg by mouth at bedtime.  04/01/17   [provider]  Melatonin 10 MG TABS Take 40 mg by mouth at bedtime.    [provider]  metFORMIN (GLUCOPHAGE) 1000 MG tablet Take 1,000 mg by mouth 2 (two) times daily.  04/01/17   [provider]  morphine (MSIR) 15 MG tablet Take 1 tablet by mouth every 4 (four) hours as needed for pain. 11/14/18   Nicholaus Bloom, MD  pioglitazone (ACTOS) 15 MG tablet Take 15 mg by mouth at bedtime. 01/17/19   [provider]    Allergies    Penicillins, Vancomycin, and Ativan [lorazepam]  Review of Systems   Review of Systems  All other systems reviewed and are negative.   Physical Exam Updated Vital Signs BP (!) 159/98   Pulse (!) 106   Temp 98.2 F (36.8 C)   Resp 18   Ht 6\' 1"  (1.854 m)   Wt 104.6 kg   SpO2 100%   BMI 30.42 kg/m   Physical Exam Vitals and nursing note reviewed.  Constitutional:      General: He is not in acute distress.    Appearance: Normal appearance. He is not ill-appearing.  HENT:     Head: Normocephalic and atraumatic.  Pulmonary:     Effort: Pulmonary effort is normal.  Musculoskeletal:     Comments: The sore to the right calf has a wet to dry dressing with wound VAC and Tegaderm in place.  There is some surrounding erythema, but proportional to the photo in his chart from yesterday's visit.  Skin:    General: Skin is warm and dry.  Neurological:     Mental Status: He is alert.     ED Results / Procedures / Treatments   Labs (all labs ordered are listed, but only abnormal results are displayed) Labs Reviewed - No data to display  EKG None  Radiology No results found.  Procedures Procedures    Medications Ordered in ED Medications  HYDROmorphone (DILAUDID) injection 1 mg (has no administration in time range)    ED Course  I have reviewed the triage vital signs and the nursing notes.  Pertinent labs & imaging results that were available during my care of the patient were reviewed by me and considered in my medical decision making (see chart for details).    MDM Rules/Calculators/A&P  Patient with history of peripheral vascular disease and recent procedure to restore flow to the leg.  He has a slow healing ulcer to his right calf that has been followed by wound care and vascular surgery.  He presents today with severe pain in this wound.  He was told by the vascular surgeon on-call to come here to be seen.  The wound VAC and dressing was removed and changed to a wet to dry dressing.  Patient feels much better after receiving Dilaudid and having the wound VAC removed.  Patient seems appropriate for discharge with follow-up with vascular surgery.  I have advised them to call tomorrow to make an expedited appointment.  Final Clinical Impression(s) / ED Diagnoses Final diagnoses:  None    Rx / DC Orders ED Discharge Orders    None       Veryl Speak, MD 05/23/20 0126

## 2020-05-23 NOTE — Discharge Instructions (Addendum)
Continue wound care and medications as before.  Call vascular surgery this morning to arrange expedited follow-up.

## 2020-05-23 NOTE — Telephone Encounter (Signed)
Patient's wife called. Patient is in severe pain. Received 2 shots of Dilaudid last night in the hospital which "barely calmed him down." Discussed with Dr. Carlis Abbott - unable to prescribe pain medicine due to contract with pain clinic. Wife states "we don't care about the contract." Advised her legally we could not prescribe further pain meds. Changed PA appt on Tuesday to appt with [facility].

## 2020-05-24 DIAGNOSIS — I70229 Atherosclerosis of native arteries of extremities with rest pain, unspecified extremity: Secondary | ICD-10-CM | POA: Diagnosis not present

## 2020-05-24 DIAGNOSIS — T8189XA Other complications of procedures, not elsewhere classified, initial encounter: Secondary | ICD-10-CM | POA: Diagnosis not present

## 2020-05-26 DIAGNOSIS — Z79891 Long term (current) use of opiate analgesic: Secondary | ICD-10-CM | POA: Diagnosis not present

## 2020-05-26 DIAGNOSIS — M4722 Other spondylosis with radiculopathy, cervical region: Secondary | ICD-10-CM | POA: Diagnosis not present

## 2020-05-26 DIAGNOSIS — I739 Peripheral vascular disease, unspecified: Secondary | ICD-10-CM | POA: Diagnosis not present

## 2020-05-26 DIAGNOSIS — E1142 Type 2 diabetes mellitus with diabetic polyneuropathy: Secondary | ICD-10-CM | POA: Diagnosis not present

## 2020-05-27 ENCOUNTER — Other Ambulatory Visit: Payer: Self-pay

## 2020-05-27 ENCOUNTER — Encounter: Payer: Self-pay | Admitting: Vascular Surgery

## 2020-05-27 ENCOUNTER — Ambulatory Visit (INDEPENDENT_AMBULATORY_CARE_PROVIDER_SITE_OTHER): Payer: PPO | Admitting: Vascular Surgery

## 2020-05-27 ENCOUNTER — Ambulatory Visit: Payer: PPO

## 2020-05-27 VITALS — BP 102/70 | HR 94 | Temp 98.1°F | Resp 18 | Ht 73.0 in | Wt 230.0 lb

## 2020-05-27 DIAGNOSIS — I70229 Atherosclerosis of native arteries of extremities with rest pain, unspecified extremity: Secondary | ICD-10-CM

## 2020-05-27 NOTE — Progress Notes (Signed)
Patient name: Wayne Pratt MRN: [id number] DOB: August 17, 1960 Sex: male  REASON FOR VISIT: Right leg wound check  HPI: Wayne Pratt is a 60 y.o. male with multiple medical problems including DM, HTN, HLD who presents for wound check of his right calf wound.  He underwent right common femoral endarterectomy with profundoplasty and bovine patch with a right common femoral to below-knee pop bypass with saphenous vein on 02/04/2020 for CLI with rest pain.  At the time of his bypass had advanced ischemia with ABIs that were 0.  He subsequently underwent partial toe amputations of right toes 1 2 and 5.  The toe amputations healed but unfortunately had a calf wound that has persisted.  He has undergone debridement and then additional debridement with myriad application.  Past Medical History:  Diagnosis Date  . Anemia    takes Iron pill daily  . Anxiety    takes Xanax daily, 02/28/20- no longer  . Arthritis   . Asthma   . Cancer ([place])    skin   . Diabetes mellitus ([place])    takes Metformin daily  . Headache    migraine  - not lately  . History of bronchitis    mid 90's  . History of hypertension    lost weight and been off meds over a yr  . History of kidney stones    Passed 32  . History of pleural empyema   . Hypercholesterolemia    lost weight and been off meds over a yr  . Hypertension   . Insomnia    takes Trazodone nightly  . Neuropathy   . Pneumonia    hx of-15+ yrs ago  . Sleep apnea    Stop Bang score of 7  . Weakness    numbness and tingling    Past Surgical History:  Procedure Laterality Date  . ABDOMINAL AORTOGRAM W/LOWER EXTREMITY N/A 01/31/2020   Procedure: ABDOMINAL AORTOGRAM W/LOWER EXTREMITY;  Surgeon: Marty Heck, MD;  Location: [facility];  Service: Cardiovascular;  Laterality: N/A;  . AMPUTATION Right 02/29/2020   Procedure: RIGHT TOE AMPUTATIONS, 1, 2, 5;  Surgeon: Marty Heck, MD;  Location: [facility];  Service: Vascular;  Laterality:  Right;  . APPLICATION OF WOUND VAC Right 02/29/2020   Procedure: APPLICATION OF WOUND VAC;  Surgeon: Marty Heck, MD;  Location: [facility];  Service: Vascular;  Laterality: Right;  . APPLICATION OF WOUND VAC Right 04/16/2020   Procedure: APPLICATION OF WOUND VAC;  Surgeon: Serafina Mitchell, MD;  Location: [place] [facility];  Service: Vascular;  Laterality: Right;  . BIOPSY  10/05/2011   Dr. Oneida Alar :non-erosive gastritis  . BIOPSY  08/06/2014   Procedure: BIOPSY;  Surgeon: Danie Binder, MD;  [facility];  Service: Endoscopy;;  . CERVICAL DISC SURGERY    . ELBOW SURGERY Right   . ENDARTERECTOMY FEMORAL Right 02/04/2020   Procedure: RIGHT FEMORAL ENDARTERECTOMY with Profundaplasty;  Surgeon: Marty Heck, MD;  Location: [facility];  Service: Vascular;  Laterality: Right;  . ESOPHAGOGASTRODUODENOSCOPY  Sept 2013   Dr. Oneida Alar: chronic inactive gastritis   . ESOPHAGOGASTRODUODENOSCOPY N/A 08/18/2017   Procedure: ESOPHAGOGASTRODUODENOSCOPY (EGD);  Surgeon: Milus Banister, MD;  [facility];  Service: Endoscopy;  Laterality: N/A;  . ESOPHAGOGASTRODUODENOSCOPY N/A 03/09/2018   Procedure: ESOPHAGOGASTRODUODENOSCOPY (EGD);  Surgeon: Milus Banister, MD;  Location[facility];  Service: Endoscopy;  Laterality: N/A;  . ESOPHAGOGASTRODUODENOSCOPY (EGD) WITH PROPOFOL N/A 08/06/2014   mild non-erosive  gastritis, duodenitis  . EUS N/A 08/18/2017   Procedure: UPPER ENDOSCOPIC ULTRASOUND (EUS) RADIAL;  Surgeon: Milus Banister, MD;  [facility];  Service: Endoscopy;  Laterality: N/A;  . EUS N/A 03/09/2018   Procedure: UPPER ENDOSCOPIC ULTRASOUND (EUS) RADIAL;  Surgeon: Milus Banister, MD;  [facility];  Service: Endoscopy;  Laterality: N/A;  . FEMORAL-POPLITEAL BYPASS GRAFT Right 02/04/2020   Procedure: BYPASS GRAFT FEMORAL-POPLITEAL ARTERY With Vein Harvest;  Surgeon: Marty Heck, MD;  Location: [place];  Service: Vascular;  Laterality: Right;  . FINE NEEDLE ASPIRATION   03/09/2018   Procedure: FINE NEEDLE ASPIRATION;  Surgeon: Milus Banister, MD;  [facility];  Service: Endoscopy;;  . FLEXIBLE SIGMOIDOSCOPY  10/05/2011   Dr. Fields:poor prep  . I & D EXTREMITY Right 04/16/2020   Procedure: RIGHT CALF WOUND IRRIGATION, DEBRIDEMENT AND GRAFTING;  Surgeon: Serafina Mitchell, MD;  Location: [place];  Service: Vascular;  Laterality: Right;  . LUNG SURGERY  2010   for empyema  . PATCH ANGIOPLASTY Right 02/04/2020   Procedure: PATCH ANGIOPLASTY;  Surgeon: Marty Heck, MD;  Location: [facility];  Service: Vascular;  Laterality: Right;  . SPINAL CORD STIMULATOR INSERTION N/A 08/01/2015   Procedure: Spinal Cord Stimulator Implant;  Surgeon: Eustace Moore, MD;  Location: [place] [facility];  Service: Neurosurgery;  Laterality: N/A;  . TONSILLECTOMY    . VASECTOMY Bilateral 1995  . VEIN HARVEST Right 02/04/2020   Procedure: VEIN HARVEST Greater Saphenous Vein;  Surgeon: Marty Heck, MD;  Location: [facility];  Service: Vascular;  Laterality: Right;  . WOUND DEBRIDEMENT Right 02/29/2020   Procedure: RIGHT CALF DEBRIDEMENT;  Surgeon: Marty Heck, MD;  Location: [facility];  Service: Vascular;  Laterality: Right;    Family History  Problem Relation Age of Onset  . Prostate cancer Other        all male members on mother's side  . Heart attack Father   . COPD Maternal Grandmother   . Cancer Paternal Grandmother   . Colon cancer Neg Hx     SOCIAL HISTORY: Social History   Tobacco Use  . Smoking status: Former Smoker    Packs/day: 1.00    Years: 39.00    Pack years: 39.00    Types: Cigarettes    Quit date: 02/04/2020    Years since quitting: 0.3  . Smokeless tobacco: Never Used  Substance Use Topics  . Alcohol use: No    Alcohol/week: 0.0 standard drinks    Allergies  Allergen Reactions  . Penicillins Anaphylaxis and Other (See Comments)    Has patient had a PCN reaction causing immediate rash, facial/tongue/throat swelling, SOB or  lightheadedness with hypotension: Yes Has patient had a PCN reaction causing severe rash involving mucus membranes or skin necrosis: Yes Has patient had a PCN reaction that required hospitalization: No Has patient had a PCN reaction occurring within the last 10 years: No If all of the above answers are "NO", then may proceed with Cephalosporin use.   . Vancomycin Anaphylaxis  . Ativan [Lorazepam] Other (See Comments)    Violent and Mean     Current Outpatient Medications  Medication Sig Dispense Refill  . acetaminophen (TYLENOL) 650 MG CR tablet Take 1,300 mg by mouth every 8 (eight) hours as needed for pain.    [name] Kitchen albuterol (VENTOLIN HFA) 108 (90 Base) MCG/ACT inhaler Inhale 1-2 puffs into the lungs every 6 (six) hours as needed for wheezing or shortness of breath. [place]  g 0  . aspirin EC 81 MG EC tablet Take 1 tablet (81 mg total) by mouth daily at 6 (six) AM. Swallow whole. 30 tablet 11  . clonazePAM (KLONOPIN) 0.5 MG tablet Take 0.5 mg by mouth 2 (two) times daily.     . cyclobenzaprine (FLEXERIL) 10 MG tablet Take 10 mg by mouth 3 (three) times daily.    [name] Kitchen GLIPIZIDE XL 10 MG 24 hr tablet Take 10 mg by mouth 2 (two) times daily.    . IBU 800 MG tablet Take 800 mg by mouth every 8 (eight) hours as needed for moderate pain.    [name] Kitchen lisinopril (ZESTRIL) 10 MG tablet Take 1 tablet (10 mg total) by mouth daily. Please call to schedule appointment for further refills. Thanks! 30 tablet 0  . lovastatin (MEVACOR) 40 MG tablet Take 40 mg by mouth at bedtime.     . Melatonin 10 MG TABS Take 40 mg by mouth at bedtime.    . metFORMIN (GLUCOPHAGE) 1000 MG tablet Take 1,000 mg by mouth 2 (two) times daily.     . Oxycodone HCl 10 MG TABS Take by mouth.    . pioglitazone (ACTOS) 15 MG tablet Take 15 mg by mouth at bedtime.    [name] Kitchen morphine (MSIR) 15 MG tablet Take 1 tablet by mouth every 4 (four) hours as needed for pain. (Patient not taking: Reported on 05/27/2020)     No current facility-administered  medications for this visit.    REVIEW OF SYSTEMS:  [X]  denotes positive finding, [ ]  denotes negative finding Cardiac  Comments:  Chest pain or chest pressure:    Shortness of breath upon exertion:    Short of breath when lying flat:    Irregular heart rhythm:        Vascular    Pain in calf, thigh, or hip brought on by ambulation:    Pain in feet at night that wakes you up from your sleep:     Blood clot in your veins:    Leg swelling:         Pulmonary    Oxygen at home:    Productive cough:     Wheezing:         Neurologic    Sudden weakness in arms or legs:     Sudden numbness in arms or legs:     Sudden onset of difficulty speaking or slurred speech:    Temporary loss of vision in one eye:     Problems with dizziness:         Gastrointestinal    Blood in stool:     Vomited blood:         Genitourinary    Burning when urinating:     Blood in urine:        Psychiatric    Major depression:         Hematologic    Bleeding problems:    Problems with blood clotting too easily:        Skin    Rashes or ulcers:        Constitutional    Fever or chills:      PHYSICAL EXAM: Vitals:   05/27/20 1359  BP: 102/70  Pulse: 94  Resp: 18  Temp: 98.1 F (36.7 C)  TempSrc: Temporal  SpO2: 95%  Weight: 230 lb (104.3 kg)  Height: 6' 1"  (1.854 m)    GENERAL: The patient is a well-nourished male, in no acute distress. The vital  signs are documented above. CARDIAC: There is a regular rate and rhythm.  VASCULAR:  Brisk right AT and PT signals Right calf wound pictured below s/p grafting with skin subtitute           DATA:   None  Assessment/Plan:  60 year old male status post right common femoral endarterectomy with profundoplasty and bovine patch and then a right common femoral to BK pop bypass with vein on 02/04/2020 for CLI with rest pain.  All of his toe right amputations have healed but he has persistence of a right calf wound.  We have performed  multiple debridements of this in the OR and most recently applied a skin substitute.  I think this looks better today.  I discussed that he can remove the [facility] which I think is causing him a lot of irritation.  I will put him in a wet-to-dry dressing.  See him in 1 month.  Discussed that given he is on chronic narcotics and has a pain clinic, I do not want to increase his pain medicine and I would defer to his pain clinic.   Marty Heck, MD [facility] Office: [phone]

## 2020-06-02 DIAGNOSIS — S81801A Unspecified open wound, right lower leg, initial encounter: Secondary | ICD-10-CM | POA: Diagnosis not present

## 2020-06-04 DIAGNOSIS — T8189XD Other complications of procedures, not elsewhere classified, subsequent encounter: Secondary | ICD-10-CM | POA: Diagnosis not present

## 2020-06-04 DIAGNOSIS — D649 Anemia, unspecified: Secondary | ICD-10-CM | POA: Diagnosis not present

## 2020-06-04 DIAGNOSIS — E1151 Type 2 diabetes mellitus with diabetic peripheral angiopathy without gangrene: Secondary | ICD-10-CM | POA: Diagnosis not present

## 2020-06-04 DIAGNOSIS — Z9181 History of falling: Secondary | ICD-10-CM | POA: Diagnosis not present

## 2020-06-04 DIAGNOSIS — G4700 Insomnia, unspecified: Secondary | ICD-10-CM | POA: Diagnosis not present

## 2020-06-04 DIAGNOSIS — G43909 Migraine, unspecified, not intractable, without status migrainosus: Secondary | ICD-10-CM | POA: Diagnosis not present

## 2020-06-04 DIAGNOSIS — J45909 Unspecified asthma, uncomplicated: Secondary | ICD-10-CM | POA: Diagnosis not present

## 2020-06-04 DIAGNOSIS — Z9682 Presence of neurostimulator: Secondary | ICD-10-CM | POA: Diagnosis not present

## 2020-06-04 DIAGNOSIS — E1140 Type 2 diabetes mellitus with diabetic neuropathy, unspecified: Secondary | ICD-10-CM | POA: Diagnosis not present

## 2020-06-04 DIAGNOSIS — Z7984 Long term (current) use of oral hypoglycemic drugs: Secondary | ICD-10-CM | POA: Diagnosis not present

## 2020-06-04 DIAGNOSIS — F17210 Nicotine dependence, cigarettes, uncomplicated: Secondary | ICD-10-CM | POA: Diagnosis not present

## 2020-06-04 DIAGNOSIS — Z8701 Personal history of pneumonia (recurrent): Secondary | ICD-10-CM | POA: Diagnosis not present

## 2020-06-04 DIAGNOSIS — Z89411 Acquired absence of right great toe: Secondary | ICD-10-CM | POA: Diagnosis not present

## 2020-06-04 DIAGNOSIS — E7800 Pure hypercholesterolemia, unspecified: Secondary | ICD-10-CM | POA: Diagnosis not present

## 2020-06-04 DIAGNOSIS — G4730 Sleep apnea, unspecified: Secondary | ICD-10-CM | POA: Diagnosis not present

## 2020-06-04 DIAGNOSIS — I10 Essential (primary) hypertension: Secondary | ICD-10-CM | POA: Diagnosis not present

## 2020-06-04 DIAGNOSIS — M479 Spondylosis, unspecified: Secondary | ICD-10-CM | POA: Diagnosis not present

## 2020-06-04 DIAGNOSIS — F419 Anxiety disorder, unspecified: Secondary | ICD-10-CM | POA: Diagnosis not present

## 2020-06-04 DIAGNOSIS — E785 Hyperlipidemia, unspecified: Secondary | ICD-10-CM | POA: Diagnosis not present

## 2020-06-04 DIAGNOSIS — Z7982 Long term (current) use of aspirin: Secondary | ICD-10-CM | POA: Diagnosis not present

## 2020-06-04 DIAGNOSIS — Z85828 Personal history of other malignant neoplasm of skin: Secondary | ICD-10-CM | POA: Diagnosis not present

## 2020-06-10 ENCOUNTER — Telehealth: Payer: Self-pay

## 2020-06-10 NOTE — Telephone Encounter (Signed)
Patient's wife called to request referral to a wound care specialist. Discussed with [facility] - placing referral to Dr. Dellia Nims. Left VM to inform.

## 2020-06-11 DIAGNOSIS — E785 Hyperlipidemia, unspecified: Secondary | ICD-10-CM | POA: Diagnosis not present

## 2020-06-11 DIAGNOSIS — M479 Spondylosis, unspecified: Secondary | ICD-10-CM | POA: Diagnosis not present

## 2020-06-11 DIAGNOSIS — G4700 Insomnia, unspecified: Secondary | ICD-10-CM | POA: Diagnosis not present

## 2020-06-11 DIAGNOSIS — F17210 Nicotine dependence, cigarettes, uncomplicated: Secondary | ICD-10-CM | POA: Diagnosis not present

## 2020-06-11 DIAGNOSIS — G43909 Migraine, unspecified, not intractable, without status migrainosus: Secondary | ICD-10-CM | POA: Diagnosis not present

## 2020-06-11 DIAGNOSIS — J45909 Unspecified asthma, uncomplicated: Secondary | ICD-10-CM | POA: Diagnosis not present

## 2020-06-11 DIAGNOSIS — Z9682 Presence of neurostimulator: Secondary | ICD-10-CM | POA: Diagnosis not present

## 2020-06-11 DIAGNOSIS — D649 Anemia, unspecified: Secondary | ICD-10-CM | POA: Diagnosis not present

## 2020-06-11 DIAGNOSIS — G4730 Sleep apnea, unspecified: Secondary | ICD-10-CM | POA: Diagnosis not present

## 2020-06-11 DIAGNOSIS — Z9181 History of falling: Secondary | ICD-10-CM | POA: Diagnosis not present

## 2020-06-11 DIAGNOSIS — Z89411 Acquired absence of right great toe: Secondary | ICD-10-CM | POA: Diagnosis not present

## 2020-06-11 DIAGNOSIS — F419 Anxiety disorder, unspecified: Secondary | ICD-10-CM | POA: Diagnosis not present

## 2020-06-11 DIAGNOSIS — Z7982 Long term (current) use of aspirin: Secondary | ICD-10-CM | POA: Diagnosis not present

## 2020-06-11 DIAGNOSIS — E1140 Type 2 diabetes mellitus with diabetic neuropathy, unspecified: Secondary | ICD-10-CM | POA: Diagnosis not present

## 2020-06-11 DIAGNOSIS — T8189XD Other complications of procedures, not elsewhere classified, subsequent encounter: Secondary | ICD-10-CM | POA: Diagnosis not present

## 2020-06-11 DIAGNOSIS — E1151 Type 2 diabetes mellitus with diabetic peripheral angiopathy without gangrene: Secondary | ICD-10-CM | POA: Diagnosis not present

## 2020-06-11 DIAGNOSIS — Z8701 Personal history of pneumonia (recurrent): Secondary | ICD-10-CM | POA: Diagnosis not present

## 2020-06-11 DIAGNOSIS — Z85828 Personal history of other malignant neoplasm of skin: Secondary | ICD-10-CM | POA: Diagnosis not present

## 2020-06-11 DIAGNOSIS — I10 Essential (primary) hypertension: Secondary | ICD-10-CM | POA: Diagnosis not present

## 2020-06-11 DIAGNOSIS — E7800 Pure hypercholesterolemia, unspecified: Secondary | ICD-10-CM | POA: Diagnosis not present

## 2020-06-11 DIAGNOSIS — Z7984 Long term (current) use of oral hypoglycemic drugs: Secondary | ICD-10-CM | POA: Diagnosis not present

## 2020-06-12 DIAGNOSIS — M4722 Other spondylosis with radiculopathy, cervical region: Secondary | ICD-10-CM | POA: Diagnosis not present

## 2020-06-12 DIAGNOSIS — Z79891 Long term (current) use of opiate analgesic: Secondary | ICD-10-CM | POA: Diagnosis not present

## 2020-06-12 DIAGNOSIS — I739 Peripheral vascular disease, unspecified: Secondary | ICD-10-CM | POA: Diagnosis not present

## 2020-06-12 DIAGNOSIS — E1142 Type 2 diabetes mellitus with diabetic polyneuropathy: Secondary | ICD-10-CM | POA: Diagnosis not present

## 2020-06-17 ENCOUNTER — Emergency Department (HOSPITAL_COMMUNITY): Payer: PPO

## 2020-06-17 ENCOUNTER — Inpatient Hospital Stay (HOSPITAL_COMMUNITY)
Admission: EM | Admit: 2020-06-17 | Discharge: 2020-06-19 | DRG: 176 | Disposition: A | Discharge status: Home / Self Care | Payer: PPO | Attending: Internal Medicine | Admitting: Internal Medicine

## 2020-06-17 ENCOUNTER — Other Ambulatory Visit: Payer: Self-pay

## 2020-06-17 ENCOUNTER — Encounter (HOSPITAL_COMMUNITY): Payer: Self-pay | Admitting: *Deleted

## 2020-06-17 DIAGNOSIS — G4700 Insomnia, unspecified: Secondary | ICD-10-CM | POA: Diagnosis not present

## 2020-06-17 DIAGNOSIS — E785 Hyperlipidemia, unspecified: Secondary | ICD-10-CM | POA: Diagnosis not present

## 2020-06-17 DIAGNOSIS — I700 Atherosclerosis of aorta: Secondary | ICD-10-CM | POA: Diagnosis not present

## 2020-06-17 DIAGNOSIS — Z881 Allergy status to other antibiotic agents status: Secondary | ICD-10-CM | POA: Diagnosis not present

## 2020-06-17 DIAGNOSIS — Z888 Allergy status to other drugs, medicaments and biological substances status: Secondary | ICD-10-CM | POA: Diagnosis not present

## 2020-06-17 DIAGNOSIS — E1151 Type 2 diabetes mellitus with diabetic peripheral angiopathy without gangrene: Secondary | ICD-10-CM | POA: Diagnosis not present

## 2020-06-17 DIAGNOSIS — E1159 Type 2 diabetes mellitus with other circulatory complications: Secondary | ICD-10-CM | POA: Diagnosis not present

## 2020-06-17 DIAGNOSIS — Z880 Allergy status to penicillin: Secondary | ICD-10-CM | POA: Diagnosis not present

## 2020-06-17 DIAGNOSIS — G894 Chronic pain syndrome: Secondary | ICD-10-CM | POA: Diagnosis present

## 2020-06-17 DIAGNOSIS — F419 Anxiety disorder, unspecified: Secondary | ICD-10-CM | POA: Diagnosis not present

## 2020-06-17 DIAGNOSIS — Z20822 Contact with and (suspected) exposure to covid-19: Secondary | ICD-10-CM | POA: Diagnosis present

## 2020-06-17 DIAGNOSIS — E7800 Pure hypercholesterolemia, unspecified: Secondary | ICD-10-CM | POA: Diagnosis not present

## 2020-06-17 DIAGNOSIS — I712 Thoracic aortic aneurysm, without rupture: Secondary | ICD-10-CM | POA: Diagnosis not present

## 2020-06-17 DIAGNOSIS — M5416 Radiculopathy, lumbar region: Secondary | ICD-10-CM | POA: Diagnosis not present

## 2020-06-17 DIAGNOSIS — S81801A Unspecified open wound, right lower leg, initial encounter: Secondary | ICD-10-CM | POA: Diagnosis not present

## 2020-06-17 DIAGNOSIS — J9811 Atelectasis: Secondary | ICD-10-CM | POA: Diagnosis not present

## 2020-06-17 DIAGNOSIS — Z87891 Personal history of nicotine dependence: Secondary | ICD-10-CM

## 2020-06-17 DIAGNOSIS — G4730 Sleep apnea, unspecified: Secondary | ICD-10-CM | POA: Diagnosis not present

## 2020-06-17 DIAGNOSIS — Z8249 Family history of ischemic heart disease and other diseases of the circulatory system: Secondary | ICD-10-CM

## 2020-06-17 DIAGNOSIS — R000 Tachycardia, unspecified: Secondary | ICD-10-CM | POA: Diagnosis not present

## 2020-06-17 DIAGNOSIS — M1990 Unspecified osteoarthritis, unspecified site: Secondary | ICD-10-CM | POA: Diagnosis not present

## 2020-06-17 DIAGNOSIS — I313 Pericardial effusion (noninflammatory): Secondary | ICD-10-CM | POA: Diagnosis not present

## 2020-06-17 DIAGNOSIS — Z85828 Personal history of other malignant neoplasm of skin: Secondary | ICD-10-CM

## 2020-06-17 DIAGNOSIS — Z7984 Long term (current) use of oral hypoglycemic drugs: Secondary | ICD-10-CM

## 2020-06-17 DIAGNOSIS — R079 Chest pain, unspecified: Secondary | ICD-10-CM | POA: Diagnosis not present

## 2020-06-17 DIAGNOSIS — I2699 Other pulmonary embolism without acute cor pulmonale: Secondary | ICD-10-CM | POA: Diagnosis not present

## 2020-06-17 DIAGNOSIS — I2694 Multiple subsegmental pulmonary emboli without acute cor pulmonale: Secondary | ICD-10-CM | POA: Diagnosis not present

## 2020-06-17 DIAGNOSIS — Z825 Family history of asthma and other chronic lower respiratory diseases: Secondary | ICD-10-CM

## 2020-06-17 DIAGNOSIS — I2609 Other pulmonary embolism with acute cor pulmonale: Secondary | ICD-10-CM | POA: Diagnosis not present

## 2020-06-17 DIAGNOSIS — I10 Essential (primary) hypertension: Secondary | ICD-10-CM | POA: Diagnosis not present

## 2020-06-17 DIAGNOSIS — R0789 Other chest pain: Secondary | ICD-10-CM | POA: Diagnosis not present

## 2020-06-17 DIAGNOSIS — T8189XA Other complications of procedures, not elsewhere classified, initial encounter: Secondary | ICD-10-CM | POA: Diagnosis not present

## 2020-06-17 DIAGNOSIS — R0689 Other abnormalities of breathing: Secondary | ICD-10-CM | POA: Diagnosis not present

## 2020-06-17 DIAGNOSIS — E119 Type 2 diabetes mellitus without complications: Secondary | ICD-10-CM

## 2020-06-17 LAB — CBC WITH DIFFERENTIAL/PLATELET
Abs Immature Granulocytes: 0.03 10*3/uL (ref 0.00–0.07)
Basophils Absolute: 0.1 10*3/uL (ref 0.0–0.1)
Basophils Relative: 1 %
Eosinophils Absolute: 0.2 10*3/uL (ref 0.0–0.5)
Eosinophils Relative: 2 %
HCT: 39.2 % (ref 39.0–52.0)
Hemoglobin: 12.2 g/dL — ABNORMAL LOW (ref 13.0–17.0)
Immature Granulocytes: 0 %
Lymphocytes Relative: 31 %
Lymphs Abs: 2.9 10*3/uL (ref 0.7–4.0)
MCH: 29.3 pg (ref 26.0–34.0)
MCHC: 31.1 g/dL (ref 30.0–36.0)
MCV: 94.2 fL (ref 80.0–100.0)
Monocytes Absolute: 0.8 10*3/uL (ref 0.1–1.0)
Monocytes Relative: 8 %
Neutro Abs: 5.2 10*3/uL (ref 1.7–7.7)
Neutrophils Relative %: 58 %
Platelets: 370 10*3/uL (ref 150–400)
RBC: 4.16 MIL/uL — ABNORMAL LOW (ref 4.22–5.81)
RDW: 13.2 % (ref 11.5–15.5)
WBC: 9.2 10*3/uL (ref 4.0–10.5)
nRBC: 0 % (ref 0.0–0.2)

## 2020-06-17 LAB — COMPREHENSIVE METABOLIC PANEL
ALT: 11 U/L (ref 0–44)
AST: 18 U/L (ref 15–41)
Albumin: 3.8 g/dL (ref 3.5–5.0)
Alkaline Phosphatase: 82 U/L (ref 38–126)
Anion gap: 9 (ref 5–15)
BUN: 17 mg/dL (ref 6–20)
CO2: 24 mmol/L (ref 22–32)
Calcium: 9.1 mg/dL (ref 8.9–10.3)
Chloride: 103 mmol/L (ref 98–111)
Creatinine, Ser: 0.92 mg/dL (ref 0.61–1.24)
GFR, Estimated: >60 mL/min (ref >60)
Glucose, Bld: 203 mg/dL — ABNORMAL HIGH (ref 70–99)
Potassium: 4.4 mmol/L (ref 3.5–5.1)
Sodium: 136 mmol/L (ref 135–145)
Total Bilirubin: 0.3 mg/dL (ref 0.3–1.2)
Total Protein: 7.4 g/dL (ref 6.5–8.1)

## 2020-06-17 LAB — TROPONIN I (HIGH SENSITIVITY)
Troponin I (High Sensitivity): 5 ng/L (ref <18)
Troponin I (High Sensitivity): 5 ng/L (ref <18)

## 2020-06-17 LAB — BRAIN NATRIURETIC PEPTIDE: B Natriuretic Peptide: 29 pg/mL (ref 0.0–100.0)

## 2020-06-17 LAB — CBG MONITORING, ED: Glucose-Capillary: 191 mg/dL — ABNORMAL HIGH (ref 70–99)

## 2020-06-17 LAB — D-DIMER, QUANTITATIVE: D-Dimer, Quant: 1.69 ug/mL-FEU — ABNORMAL HIGH (ref 0.00–0.50)

## 2020-06-17 LAB — PROTIME-INR
INR: 1 (ref 0.8–1.2)
Prothrombin Time: 12.7 seconds (ref 11.4–15.2)

## 2020-06-17 LAB — MAGNESIUM: Magnesium: 1.7 mg/dL (ref 1.7–2.4)

## 2020-06-17 LAB — GLUCOSE, CAPILLARY: Glucose-Capillary: 145 mg/dL — ABNORMAL HIGH (ref 70–99)

## 2020-06-17 MED ORDER — ALBUTEROL SULFATE (2.5 MG/3ML) 0.083% IN NEBU: 2.5000 mg | INHALATION_SOLUTION | Freq: Four times a day (QID) | RESPIRATORY_TRACT | Status: DC | PRN

## 2020-06-17 MED ORDER — IOHEXOL 350 MG/ML SOLN
100.0000 mL | Freq: Once | INTRAVENOUS | Status: AC | PRN
  Administered 2020-06-17: 100 mL via INTRAVENOUS

## 2020-06-17 MED ORDER — PRAVASTATIN SODIUM 40 MG PO TABS
40.0000 mg | ORAL_TABLET | Freq: Every day | ORAL | Status: DC
  Administered 2020-06-18: 40 mg via ORAL
  Filled 2020-06-17: qty 1

## 2020-06-17 MED ORDER — HEPARIN (PORCINE) 25000 UT/250ML-% IV SOLN
1800.0000 [IU]/h | INTRAVENOUS | Status: DC
  Administered 2020-06-17: 1600 [IU]/h via INTRAVENOUS
  Administered 2020-06-18: 1800 [IU]/h via INTRAVENOUS
  Filled 2020-06-17 (×2): qty 250

## 2020-06-17 MED ORDER — CLONAZEPAM 0.5 MG PO TABS
0.5000 mg | ORAL_TABLET | Freq: Two times a day (BID) | ORAL | Status: DC
  Administered 2020-06-17 – 2020-06-19 (×4): 0.5 mg via ORAL
  Filled 2020-06-17 (×4): qty 1

## 2020-06-17 MED ORDER — INSULIN ASPART 100 UNIT/ML IJ SOLN
0.0000 [IU] | Freq: Three times a day (TID) | INTRAMUSCULAR | Status: DC
Start: 2020-06-18 — End: 2020-06-19
  Administered 2020-06-18: 2 [IU] via SUBCUTANEOUS
  Administered 2020-06-18 – 2020-06-19 (×2): 3 [IU] via SUBCUTANEOUS
  Administered 2020-06-19: 2 [IU] via SUBCUTANEOUS

## 2020-06-17 MED ORDER — LISINOPRIL 10 MG PO TABS
10.0000 mg | ORAL_TABLET | Freq: Every day | ORAL | Status: DC
  Administered 2020-06-18 – 2020-06-19 (×2): 10 mg via ORAL
  Filled 2020-06-17 (×2): qty 1

## 2020-06-17 MED ORDER — ASPIRIN EC 81 MG PO TBEC
81.0000 mg | DELAYED_RELEASE_TABLET | Freq: Every day | ORAL | Status: DC
  Administered 2020-06-18 – 2020-06-19 (×2): 81 mg via ORAL
  Filled 2020-06-17 (×2): qty 1

## 2020-06-17 MED ORDER — POLYETHYLENE GLYCOL 3350 17 G PO PACK: 17.0000 g | PACK | Freq: Every day | ORAL | Status: DC | PRN

## 2020-06-17 MED ORDER — ACETAMINOPHEN 325 MG PO TABS: 650.0000 mg | ORAL_TABLET | Freq: Four times a day (QID) | ORAL | Status: DC | PRN

## 2020-06-17 MED ORDER — HEPARIN BOLUS VIA INFUSION
6000.0000 [IU] | Freq: Once | INTRAVENOUS | Status: AC
  Administered 2020-06-17: 6000 [IU] via INTRAVENOUS

## 2020-06-17 MED ORDER — INSULIN ASPART 100 UNIT/ML IJ SOLN
0.0000 [IU] | Freq: Every day | INTRAMUSCULAR | Status: DC
  Administered 2020-06-18: 2 [IU] via SUBCUTANEOUS

## 2020-06-17 MED ORDER — ONDANSETRON HCL 4 MG PO TABS: 4.0000 mg | ORAL_TABLET | Freq: Four times a day (QID) | ORAL | Status: DC | PRN

## 2020-06-17 MED ORDER — ONDANSETRON HCL 4 MG/2ML IJ SOLN: 4.0000 mg | Freq: Four times a day (QID) | INTRAMUSCULAR | Status: DC | PRN

## 2020-06-17 MED ORDER — ACETAMINOPHEN 650 MG RE SUPP: 650.0000 mg | Freq: Four times a day (QID) | RECTAL | Status: DC | PRN

## 2020-06-17 MED ORDER — OXYCODONE HCL 5 MG PO TABS
5.0000 mg | ORAL_TABLET | ORAL | Status: DC | PRN
  Administered 2020-06-18 (×3): 5 mg via ORAL
  Filled 2020-06-17 (×3): qty 1

## 2020-06-17 MED ORDER — ASPIRIN 81 MG PO CHEW: 324.0000 mg | CHEWABLE_TABLET | Freq: Once | ORAL | Status: DC

## 2020-06-17 MED ORDER — HYDROMORPHONE HCL 2 MG PO TABS
4.0000 mg | ORAL_TABLET | Freq: Four times a day (QID) | ORAL | Status: DC | PRN
  Administered 2020-06-18 – 2020-06-19 (×5): 4 mg via ORAL
  Filled 2020-06-17 (×5): qty 2

## 2020-06-17 MED ORDER — MORPHINE SULFATE (PF) 4 MG/ML IV SOLN
8.0000 mg | Freq: Once | INTRAVENOUS | Status: AC
  Administered 2020-06-17: 8 mg via INTRAVENOUS
  Filled 2020-06-17: qty 2

## 2020-06-17 MED ORDER — HYDROMORPHONE HCL 1 MG/ML IJ SOLN
1.0000 mg | Freq: Once | INTRAMUSCULAR | Status: AC
Start: 2020-06-17 — End: 2020-06-17
  Administered 2020-06-17: 1 mg via INTRAVENOUS
  Filled 2020-06-17: qty 1

## 2020-06-17 NOTE — ED Provider Notes (Signed)
[facility] EMERGENCY DEPARTMENT Provider Note   CSN: [id number] Arrival date & time: 06/17/20  1959     History Chief Complaint  Patient presents with  . Chest Pain    Wayne Pratt is a 60 y.o. male.  HPI Patient presents via EMS with chest pain.  Onset was about an hour prior to ED arrival.  Pain is focal, sternal, nonradiating with associated dyspnea and pain with inspiration.  Patient was hypertensive in route, and both pain and blood pressure improved with morphine and nitroglycerin from 017 systolic to 793. Pain is not tolerable, though persistent in the same area.  EMS reports no other hemodynamic abnormalities.  Patient was awake and alert throughout transport. Patient has a history of multiple medical issues including hypertension.    Past Medical History:  Diagnosis Date  . Anemia    takes Iron pill daily  . Anxiety    takes Xanax daily, 02/28/20- no longer  . Arthritis   . Asthma   . Cancer ([place])    skin   . Diabetes mellitus ([place])    takes Metformin daily  . Headache    migraine  - not lately  . History of bronchitis    mid 90's  . History of hypertension    lost weight and been off meds over a yr  . History of kidney stones    Passed 32  . History of pleural empyema   . Hypercholesterolemia    lost weight and been off meds over a yr  . Hypertension   . Insomnia    takes Trazodone nightly  . Neuropathy   . Pneumonia    hx of-15+ yrs ago  . Sleep apnea    Stop Bang score of 7  . Weakness    numbness and tingling    Patient Active Problem List   Diagnosis Date Noted  . Gangrene of toe ([place]) 02/29/2020  . Ischemic rest pain of lower extremity 02/04/2020  . Critical lower limb ischemia ([place]) 01/29/2020  . Hypertension 01/25/2020  . Radiculopathy, lumbar region 01/15/2020  . Battery end of life of spinal cord stimulator 08/23/2019  . Body mass index (BMI) 31.0-31.9, adult 08/09/2019  . Neck pain 08/09/2019  . Spinal stenosis, cervical region  08/09/2019  . Crushing injury of finger 05/15/2019  . Neuropraxia of upper extremity, left, initial encounter 05/15/2019  . Abdominal pain, epigastric 06/20/2017  . Dilation of pancreatic duct 06/20/2017  . S/P insertion of spinal cord stimulator 08/01/2015  . Neuropathic pain 06/16/2015  . Rectal bleeding 07/17/2014  . Anemia 07/17/2014  . Fatty liver 09/07/2011  . Diabetes mellitus ([place]) 09/07/2011  . Encounter for screening colonoscopy 09/07/2011  . Chronic pain 09/07/2011    Past Surgical History:  Procedure Laterality Date  . ABDOMINAL AORTOGRAM W/LOWER EXTREMITY N/A 01/31/2020   Procedure: ABDOMINAL AORTOGRAM W/LOWER EXTREMITY;  Surgeon: Marty Heck, MD;  Location: [place] [facility];  Service: Cardiovascular;  Laterality: N/A;  . AMPUTATION Right 02/29/2020   Procedure: RIGHT TOE AMPUTATIONS, 1, 2, 5;  Surgeon: Marty Heck, MD;  Location: [place];  Service: Vascular;  Laterality: Right;  . APPLICATION OF WOUND VAC Right 02/29/2020   Procedure: APPLICATION OF WOUND VAC;  Surgeon: Marty Heck, MD;  Location: [place];  Service: Vascular;  Laterality: Right;  . APPLICATION OF WOUND VAC Right 04/16/2020   Procedure: APPLICATION OF WOUND VAC;  Surgeon: Serafina Mitchell, MD;  Location: [place];  Service: Vascular;  Laterality: Right;  .  BIOPSY  10/05/2011   Dr. Oneida Alar :non-erosive gastritis  . BIOPSY  08/06/2014   Procedure: BIOPSY;  Surgeon: Danie Binder, MD;  Location: [facility];  Service: Endoscopy;;  . CERVICAL DISC SURGERY    . ELBOW SURGERY Right   . ENDARTERECTOMY FEMORAL Right 02/04/2020   Procedure: RIGHT FEMORAL ENDARTERECTOMY with Profundaplasty;  Surgeon: Marty Heck, MD;  Location: [place];  Service: Vascular;  Laterality: Right;  . ESOPHAGOGASTRODUODENOSCOPY  Sept 2013   Dr. Oneida Alar: chronic inactive gastritis   . ESOPHAGOGASTRODUODENOSCOPY N/A 08/18/2017   Procedure: ESOPHAGOGASTRODUODENOSCOPY (EGD);  Surgeon: Milus Banister, MD;  [facility];  Service: Endoscopy;  Laterality: N/A;  . ESOPHAGOGASTRODUODENOSCOPY N/A 03/09/2018   Procedure: ESOPHAGOGASTRODUODENOSCOPY (EGD);  Surgeon: Milus Banister, MD;  Location: [facility];  Service: Endoscopy;  Laterality: N/A;  . ESOPHAGOGASTRODUODENOSCOPY (EGD) WITH PROPOFOL N/A 08/06/2014   mild non-erosive gastritis, duodenitis  . EUS N/A 08/18/2017   Procedure: UPPER ENDOSCOPIC ULTRASOUND (EUS) RADIAL;  Surgeon: Milus Banister, MD;  Location: [facility];  Service: Endoscopy;  Laterality: N/A;  . EUS N/A 03/09/2018   Procedure: UPPER ENDOSCOPIC ULTRASOUND (EUS) RADIAL;  Surgeon: Milus Banister, MD;  [facility]: [facility];  Service: Endoscopy;  Laterality: N/A;  . FEMORAL-POPLITEAL BYPASS GRAFT Right 02/04/2020   Procedure: BYPASS GRAFT FEMORAL-POPLITEAL ARTERY With Vein Harvest;  Surgeon: Marty Heck, MD;  Location: [place];  Service: Vascular;  Laterality: Right;  . FINE NEEDLE ASPIRATION  03/09/2018   Procedure: FINE NEEDLE ASPIRATION;  Surgeon: Milus Banister, MD;  Location: [facility];  Service: Endoscopy;;  . FLEXIBLE SIGMOIDOSCOPY  10/05/2011   Dr. Fields:poor prep  . I & D EXTREMITY Right 04/16/2020   Procedure: RIGHT CALF WOUND IRRIGATION, DEBRIDEMENT AND GRAFTING;  Surgeon: Serafina Mitchell, MD;  Location: [place];  Service: Vascular;  Laterality: Right;  . LUNG SURGERY  2010   for empyema  . PATCH ANGIOPLASTY Right 02/04/2020   Procedure: PATCH ANGIOPLASTY;  Surgeon: Marty Heck, MD;  Location: [place];  Service: Vascular;  Laterality: Right;  . SPINAL CORD STIMULATOR INSERTION N/A 08/01/2015   Procedure: Spinal Cord Stimulator Implant;  Surgeon: Eustace Moore, MD;  Location: [place] [facility];  Service: Neurosurgery;  Laterality: N/A;  . TONSILLECTOMY    . VASECTOMY Bilateral 1995  . VEIN HARVEST Right 02/04/2020   Procedure: VEIN HARVEST Greater Saphenous Vein;  Surgeon: Marty Heck, MD;  Location: [facility];  Service: Vascular;  Laterality: Right;  .  WOUND DEBRIDEMENT Right 02/29/2020   Procedure: RIGHT CALF DEBRIDEMENT;  Surgeon: Marty Heck, MD;  Location: [facility];  Service: Vascular;  Laterality: Right;       Family History  Problem Relation Age of Onset  . Prostate cancer Other        all male members on mother's side  . Heart attack Father   . COPD Maternal Grandmother   . Cancer Paternal Grandmother   . Colon cancer Neg Hx     Social History   Tobacco Use  . Smoking status: Former Smoker    Packs/day: 1.00    Years: 39.00    Pack years: 39.00    Types: Cigarettes    Quit date: 02/04/2020    Years since quitting: 0.3  . Smokeless tobacco: Never Used  Vaping Use  . Vaping Use: Never used  Substance Use Topics  . Alcohol use: No    Alcohol/week: 0.0 standard drinks  . Drug use: No  Home Medications Prior to Admission medications   Medication Sig Start Date End Date Taking? Authorizing Provider  acetaminophen (TYLENOL) 650 MG CR tablet Take 1,300 mg by mouth every 8 (eight) hours as needed for pain.   Yes [provider]  albuterol (VENTOLIN HFA) 108 (90 Base) MCG/ACT inhaler Inhale 1-2 puffs into the lungs every 6 (six) hours as needed for wheezing or shortness of breath. 03/15/19  Yes Avegno, Darrelyn Hillock, FNP  aspirin EC 81 MG EC tablet Take 1 tablet (81 mg total) by mouth daily at 6 (six) AM. Swallow whole. 02/08/20  Yes Dagoberto Ligas, PA-C  clonazePAM (KLONOPIN) 0.5 MG tablet Take 0.5 mg by mouth 2 (two) times daily.  06/17/17  Yes [provider]  cyclobenzaprine (FLEXERIL) 10 MG tablet Take 10 mg by mouth 3 (three) times daily. 04/14/17  Yes [provider]  GLIPIZIDE XL 10 MG 24 hr tablet Take 10 mg by mouth 2 (two) times daily. 01/17/19  Yes [provider]  HYDROmorphone (DILAUDID) 4 MG tablet Take 4 mg by mouth 4 (four) times daily as needed. 06/12/20  Yes [provider]  IBU 800 MG tablet Take 800 mg by mouth every 8 (eight) hours as needed for moderate pain.  01/20/18  Yes [provider]  lisinopril (ZESTRIL) 10 MG tablet Take 1 tablet (10 mg total) by mouth daily. Please call to schedule appointment for further refills. Thanks! 04/24/20  Yes Donato Heinz, MD  lovastatin (MEVACOR) 40 MG tablet Take 40 mg by mouth at bedtime.  04/01/17  Yes [provider]  Melatonin 10 MG TABS Take 40 mg by mouth at bedtime.   Yes [provider]  metFORMIN (GLUCOPHAGE) 1000 MG tablet Take 1,000 mg by mouth 2 (two) times daily.  04/01/17  Yes [provider]  pioglitazone (ACTOS) 15 MG tablet Take 15 mg by mouth at bedtime. 01/17/19  Yes [provider]  morphine (MSIR) 15 MG tablet Take 1 tablet by mouth every 4 (four) hours as needed for pain. Patient not taking: No sig reported 11/14/18   Nicholaus [place], MD  Oxycodone HCl 10 MG TABS Take by mouth. Patient not taking: No sig reported 05/26/20   [provider]    Allergies    Penicillins, Vancomycin, and Ativan [lorazepam]  Review of Systems   Review of Systems  Constitutional:       Per HPI, otherwise negative  HENT:       Per HPI, otherwise negative  Respiratory:       Per HPI, otherwise negative  Cardiovascular:       Per HPI, otherwise negative  Gastrointestinal: Negative for vomiting.  Endocrine:       Negative aside from HPI  Genitourinary:       Neg aside from HPI   Musculoskeletal:       Per HPI, otherwise negative  Skin: Negative.   Neurological: Negative for syncope.    Physical Exam Updated Vital Signs BP 115/78 (BP Location: Left Arm)   Pulse (!) 115   Temp 98 F (36.7 C) (Oral)   Resp 20   Ht 6\' 1"  (1.854 m)   Wt 102.1 kg   SpO2 99%   BMI 29.69 kg/m   Physical Exam Vitals and nursing note reviewed.  Constitutional:      General: He is not in acute distress.    Appearance: He is well-developed.  HENT:     Head: Normocephalic and atraumatic.  Eyes:  Conjunctiva/sclera: Conjunctivae normal.  Cardiovascular:      Rate and Rhythm: Normal rate and regular rhythm.  Pulmonary:     Effort: Pulmonary effort is normal. No respiratory distress.     Breath sounds: No stridor.  Abdominal:     General: There is no distension.  Musculoskeletal:     Comments: Multiple prior amputated toes, right.  Skin:    General: Skin is warm and dry.  Neurological:     Mental Status: He is alert and oriented to person, place, and time.      ED Results / Procedures / Treatments   Labs (all labs ordered are listed, but only abnormal results are displayed) Labs Reviewed  COMPREHENSIVE METABOLIC PANEL - Abnormal; Notable for the following components:      Result Value   Glucose, Bld 203 (*)    All other components within normal limits  CBC WITH DIFFERENTIAL/PLATELET - Abnormal; Notable for the following components:   RBC 4.16 (*)    Hemoglobin 12.2 (*)    All other components within normal limits  D-DIMER, QUANTITATIVE - Abnormal; Notable for the following components:   D-Dimer, Quant 1.69 (*)    All other components within normal limits  CBG MONITORING, ED - Abnormal; Notable for the following components:   Glucose-Capillary 191 (*)    All other components within normal limits  SARS CORONAVIRUS 2 (TAT 6-24 HRS)  MAGNESIUM  BRAIN NATRIURETIC PEPTIDE  PROTIME-INR  HEPARIN LEVEL (UNFRACTIONATED)  CBC  TROPONIN I (HIGH SENSITIVITY)  TROPONIN I (HIGH SENSITIVITY)    EKG EKG Interpretation  Date/Time:  Tuesday June 17 2020 20:06:27 EDT Ventricular Rate:  118 PR Interval:  155 QRS Duration: 84 QT Interval:  302 QTC Calculation: 424 R Axis:   50 Text Interpretation: Sinus tachycardia Abnormal ECG Confirmed by Carmin Muskrat [id number]) on 06/17/2020 8:51:02 PM   Radiology CT Angio Chest PE W/Cm &/Or Wo Cm  Result Date: 06/17/2020 CLINICAL DATA:  PE suspected, high prob Chest pain. EXAM: CT ANGIOGRAPHY CHEST WITH CONTRAST TECHNIQUE: Multidetector CT imaging of the chest was performed using the standard  protocol during bolus administration of intravenous contrast. Multiplanar CT image reconstructions and MIPs were obtained to evaluate the vascular anatomy. CONTRAST:  125mL OMNIPAQUE IOHEXOL 350 MG/ML SOLN COMPARISON:  Radiograph earlier today.  Chest CTA 01/18/2019 FINDINGS: Cardiovascular: Positive for acute bilateral pulmonary emboli. There are filling defects within the lingular lobe are and left lower segmental and subsegmental branches. Filling defects in the right segmental middle lobe and subsegmental lower lobe branches. Overall clot burden is mild-to-moderate. There is no right heart strain, RV to LV ratio is less than 1. Coronary artery calcifications/stents. Ascending aortic aneurysm at 4 cm. No aortic dissection. Small pericardial effusion. Mediastinum/Nodes: No enlarged mediastinal or hilar lymph nodes. No esophageal wall thickening. No thyroid nodule. Lungs/Pleura: No pulmonary infarct or acute airspace disease. Mild atelectasis or scarring in the lingula. Subsegmental atelectasis in the dependent right lower lobe. No pleural fluid. Trachea and central bronchi are patent. Upper Abdomen: Hepatic hypodensities unchanged from prior and typical of cyst. Calcified granuloma. No acute upper abdominal findings. Musculoskeletal: Partial fusion of right lateral fourth and fifth ribs. Thoracic scoliosis and spondylosis. Spinal stimulator in place. Review of the MIP images confirms the above findings. IMPRESSION: 1. Positive for acute bilateral pulmonary emboli. Overall clot burden is mild-to-moderate, but no right heart strain. 2. Small pericardial effusion. 3. Ascending aortic aneurysm at 4 cm. Recommend annual imaging followup by CTA or MRA. This recommendation follows  2010 ACCF/AHA/AATS/ACR/ASA/SCA/SCAI/SIR/STS/SVM Guidelines for the Diagnosis and Management of Patients with Thoracic Aortic Disease. Circulation. 2010; 121: T062-I948. Aortic aneurysm NOS (ICD10-I71.9) Aortic Atherosclerosis (ICD10-I70.0).  Critical Value/emergent results were called by telephone at the time of interpretation on 06/17/2020 at 9:29 pm to provider Carmin Muskrat , who verbally acknowledged these results. Electronically Signed   By: Keith Rake M.D.   On: 06/17/2020 21:30   DG Chest Portable 1 View  Result Date: 06/17/2020 CLINICAL DATA:  Left-sided chest pain. EXAM: PORTABLE CHEST 1 VIEW COMPARISON:  Radiograph 03/15/2019 FINDINGS: The cardiomediastinal contours are normal. Mild streaky bibasilar atelectasis. Pulmonary vasculature is normal. No consolidation, pleural effusion, or pneumothorax. No acute osseous abnormalities are seen. Spinal stimulator in place. There are overlying telemetry leads. IMPRESSION: Mild streaky bibasilar atelectasis. Electronically Signed   By: Keith Rake M.D.   On: 06/17/2020 20:36    Procedures Procedures   Medications Ordered in ED Medications  aspirin chewable tablet 324 mg (324 mg Oral Not Given 06/17/20 2024)  heparin bolus via infusion 6,000 Units (has no administration in time range)  heparin ADULT infusion 100 units/mL (25000 units/241mL) (has no administration in time range)  morphine 4 MG/ML injection 8 mg (8 mg Intravenous Given 06/17/20 2023)  iohexol (OMNIPAQUE) 350 MG/ML injection 100 mL (100 mLs Intravenous Contrast Given 06/17/20 2112)  HYDROmorphone (DILAUDID) injection 1 mg (1 mg Intravenous Given 06/17/20 2129)    ED Course  I have reviewed the triage vital signs and the nursing notes.  Pertinent labs & imaging results that were available during my care of the patient were reviewed by me and considered in my medical decision making (see chart for details).   With concern for chest pain, ACS, PE, pneumonia, patient was placed on continuous cardiac monitoring, pulse oximetry. Cardiac monitor 110 sinus tach abnormal Pulse ox 99% with 2 L via nasal cannula, provided by EMS, abnormal  D-dimer positive.  9:46 PM I discussed the patient's case with the radiologist.   I have reviewed the images myself, concern for bilateral pulmonary embolism, no heart strain. Patient and wife are aware of all findings as well.  Given concern for pulmonary embolism he will start heparin therapy.  Initial troponin result is reassuring, EKG is nonischemic, which are both somewhat reassuring findings.  MDM Rules/Calculators/A&P MDM Number of Diagnoses or Management Options Bilateral pulmonary embolism ([place]): new, needed workup   Amount and/or Complexity of Data Reviewed Clinical lab tests: reviewed and ordered Tests in the radiology section of CPT: ordered and reviewed Tests in the medicine section of CPT: ordered and reviewed Discussion of test results with the performing providers: yes Decide to obtain previous medical records or to obtain history from someone other than the patient: yes Obtain history from someone other than the patient: yes Review and summarize past medical records: yes Discuss the patient with other providers: yes Independent visualization of images, tracings, or specimens: yes  Risk of Complications, Morbidity, and/or Mortality Presenting problems: high Diagnostic procedures: high Management options: high  Critical Care Total time providing critical care: 30-74 minutes (40)  Patient Progress Patient progress: improved  Final Clinical Impression(s) / ED Diagnoses Final diagnoses:  Bilateral pulmonary embolism [facility])    Rx / DC Orders ED Discharge Orders    None       Carmin Muskrat, MD 06/17/20 2148

## 2020-06-17 NOTE — ED Triage Notes (Signed)
Pt arrived from home via EMS, chest pain since yesterday, called 911 this evening, was directed to chew 4 low dose aspirin, EMS administered 1 nitro tab and 4 of morphiene, 20 in right AC. Pt states his pain level is currently at a 6 and increasing.

## 2020-06-17 NOTE — Progress Notes (Signed)
ANTICOAGULATION CONSULT NOTE - Initial Consult  Pharmacy Consult for heparin Indication: pulmonary embolus  Allergies  Allergen Reactions  . Penicillins Anaphylaxis and Other (See Comments)    Has patient had a PCN reaction causing immediate rash, facial/tongue/throat swelling, SOB or lightheadedness with hypotension: Yes Has patient had a PCN reaction causing severe rash involving mucus membranes or skin necrosis: Yes Has patient had a PCN reaction that required hospitalization: No Has patient had a PCN reaction occurring within the last 10 years: No If all of the above answers are "NO", then may proceed with Cephalosporin use.   . Vancomycin Anaphylaxis  . Ativan [Lorazepam] Other (See Comments)    Violent and Mean     Patient Measurements: Height: 6\' 1"  (185.4 cm) Weight: 102.1 kg (225 lb) IBW/kg (Calculated) : 79.9 Heparin Dosing Weight: 100kg  Vital Signs: Temp: 98 F (36.7 C) (06/07 2010) Temp Source: Oral (06/07 2010) BP: 109/70 (06/07 2100) Pulse Rate: 98 (06/07 2100)  Labs: Recent Labs    06/17/20 2006  HGB 12.2*  HCT 39.2  PLT 370  LABPROT 12.7  INR 1.0  CREATININE 0.92  TROPONINIHS 5    Estimated Creatinine Clearance: 107.2 mL/min (by C-G formula based on SCr of 0.92 mg/dL).   Medical History: Past Medical History:  Diagnosis Date  . Anemia    takes Iron pill daily  . Anxiety    takes Xanax daily, 02/28/20- no longer  . Arthritis   . Asthma   . Cancer ([place])    skin   . Diabetes mellitus ([place])    takes Metformin daily  . Headache    migraine  - not lately  . History of bronchitis    mid 90's  . History of hypertension    lost weight and been off meds over a yr  . History of kidney stones    Passed 32  . History of pleural empyema   . Hypercholesterolemia    lost weight and been off meds over a yr  . Hypertension   . Insomnia    takes Trazodone nightly  . Neuropathy   . Pneumonia    hx of-15+ yrs ago  . Sleep apnea    Stop Bang  score of 7  . Weakness    numbness and tingling    Assessment: 60yo male c/o CP since yesterday, troponin negative but D-dimer elevated >> CT reveals bilateral PE w/ mild-moderate clot burden, to begin heparin.  Goal of Therapy:  Heparin level 0.3-0.7 units/ml Monitor platelets by anticoagulation protocol: Yes   Plan:  Heparin 6000 units IV bolus x1 followed by gtt at 1600 units/hr. Monitor heparin levels and CBC.  Wynona Neat, PharmD, BCPS  06/17/2020,9:35 PM

## 2020-06-18 ENCOUNTER — Inpatient Hospital Stay (HOSPITAL_COMMUNITY): Payer: PPO

## 2020-06-18 ENCOUNTER — Other Ambulatory Visit (HOSPITAL_COMMUNITY): Payer: Self-pay | Admitting: *Deleted

## 2020-06-18 DIAGNOSIS — F419 Anxiety disorder, unspecified: Secondary | ICD-10-CM | POA: Diagnosis present

## 2020-06-18 DIAGNOSIS — E785 Hyperlipidemia, unspecified: Secondary | ICD-10-CM

## 2020-06-18 DIAGNOSIS — M62838 Other muscle spasm: Secondary | ICD-10-CM

## 2020-06-18 DIAGNOSIS — I2609 Other pulmonary embolism with acute cor pulmonale: Secondary | ICD-10-CM

## 2020-06-18 LAB — GLUCOSE, CAPILLARY
Glucose-Capillary: 178 mg/dL — ABNORMAL HIGH (ref 70–99)
Glucose-Capillary: 123 mg/dL — ABNORMAL HIGH (ref 70–99)
Glucose-Capillary: 237 mg/dL — ABNORMAL HIGH (ref 70–99)

## 2020-06-18 LAB — CBC
HCT: 35.6 % — ABNORMAL LOW (ref 39.0–52.0)
Hemoglobin: 11.1 g/dL — ABNORMAL LOW (ref 13.0–17.0)
MCH: 29.8 pg (ref 26.0–34.0)
MCHC: 31.2 g/dL (ref 30.0–36.0)
MCV: 95.7 fL (ref 80.0–100.0)
Platelets: 317 10*3/uL (ref 150–400)
RBC: 3.72 MIL/uL — ABNORMAL LOW (ref 4.22–5.81)
RDW: 13.2 % (ref 11.5–15.5)
WBC: 6.6 10*3/uL (ref 4.0–10.5)
nRBC: 0 % (ref 0.0–0.2)

## 2020-06-18 LAB — COMPREHENSIVE METABOLIC PANEL
ALT: 9 U/L (ref 0–44)
AST: 13 U/L — ABNORMAL LOW (ref 15–41)
Albumin: 3.5 g/dL (ref 3.5–5.0)
Alkaline Phosphatase: 73 U/L (ref 38–126)
Anion gap: 10 (ref 5–15)
BUN: 20 mg/dL (ref 6–20)
CO2: 24 mmol/L (ref 22–32)
Calcium: 8.4 mg/dL — ABNORMAL LOW (ref 8.9–10.3)
Chloride: 99 mmol/L (ref 98–111)
Creatinine, Ser: 0.79 mg/dL (ref 0.61–1.24)
GFR, Estimated: >60 mL/min (ref >60)
Glucose, Bld: 167 mg/dL — ABNORMAL HIGH (ref 70–99)
Potassium: 3.9 mmol/L (ref 3.5–5.1)
Sodium: 133 mmol/L — ABNORMAL LOW (ref 135–145)
Total Bilirubin: 0.2 mg/dL — ABNORMAL LOW (ref 0.3–1.2)
Total Protein: 6.7 g/dL (ref 6.5–8.1)

## 2020-06-18 LAB — HEPARIN LEVEL (UNFRACTIONATED): Heparin Unfractionated: 0.23 IU/mL — ABNORMAL LOW (ref 0.30–0.70)

## 2020-06-18 LAB — ECHOCARDIOGRAM COMPLETE
Area-P 1/2: 2.92 cm2
Height: 73 in
S' Lateral: 3.9 cm
Weight: 3600 oz

## 2020-06-18 LAB — SARS CORONAVIRUS 2 (TAT 6-24 HRS): SARS Coronavirus 2: NEGATIVE

## 2020-06-18 MED ORDER — APIXABAN 5 MG PO TABS
10.0000 mg | ORAL_TABLET | Freq: Two times a day (BID) | ORAL | Status: DC
  Administered 2020-06-18 – 2020-06-19 (×3): 10 mg via ORAL
  Filled 2020-06-18 (×3): qty 2

## 2020-06-18 MED ORDER — METHOCARBAMOL 500 MG PO TABS
500.0000 mg | ORAL_TABLET | Freq: Three times a day (TID) | ORAL | Status: DC | PRN
  Administered 2020-06-18: 500 mg via ORAL
  Filled 2020-06-18: qty 1

## 2020-06-18 MED ORDER — CYCLOBENZAPRINE HCL 10 MG PO TABS
10.0000 mg | ORAL_TABLET | Freq: Once | ORAL | Status: AC
  Administered 2020-06-18: 10 mg via ORAL
  Filled 2020-06-18: qty 1

## 2020-06-18 MED ORDER — HYDROMORPHONE HCL 1 MG/ML IJ SOLN
2.0000 mg | Freq: Once | INTRAMUSCULAR | Status: AC
  Administered 2020-06-18: 2 mg via INTRAVENOUS
  Filled 2020-06-18: qty 2

## 2020-06-18 MED ORDER — HYDROMORPHONE HCL 1 MG/ML IJ SOLN
3.0000 mg | Freq: Once | INTRAMUSCULAR | Status: AC
Start: 2020-06-18 — End: 2020-06-18
  Administered 2020-06-18: 3 mg via INTRAVENOUS
  Filled 2020-06-18: qty 3

## 2020-06-18 MED ORDER — HEPARIN BOLUS VIA INFUSION
2000.0000 [IU] | Freq: Once | INTRAVENOUS | Status: AC
  Administered 2020-06-18: 2000 [IU] via INTRAVENOUS
  Filled 2020-06-18: qty 2000

## 2020-06-18 MED ORDER — OXYCODONE HCL 5 MG PO TABS
10.0000 mg | ORAL_TABLET | ORAL | Status: DC | PRN
Start: 2020-06-18 — End: 2020-06-19
  Administered 2020-06-19 (×3): 10 mg via ORAL
  Filled 2020-06-18 (×3): qty 2

## 2020-06-18 MED ORDER — APIXABAN 5 MG PO TABS: 5.0000 mg | ORAL_TABLET | Freq: Two times a day (BID) | ORAL | Status: DC

## 2020-06-18 NOTE — Progress Notes (Signed)
*  PRELIMINARY RESULTS* Echocardiogram 2D Echocardiogram has been performed.  Wayne Pratt 06/18/2020, 10:41 AM

## 2020-06-18 NOTE — Progress Notes (Signed)
[facility] for heparin--> eliquis Indication: pulmonary embolus  Allergies  Allergen Reactions  . Penicillins Anaphylaxis and Other (See Comments)    Has patient had a PCN reaction causing immediate rash, facial/tongue/throat swelling, SOB or lightheadedness with hypotension: Yes Has patient had a PCN reaction causing severe rash involving mucus membranes or skin necrosis: Yes Has patient had a PCN reaction that required hospitalization: No Has patient had a PCN reaction occurring within the last 10 years: No If all of the above answers are "NO", then may proceed with Cephalosporin use.   . Vancomycin Anaphylaxis  . Ativan [Lorazepam] Other (See Comments)    Violent and Mean     Patient Measurements: Height: 6\' 1"  (185.4 cm) Weight: 102.1 kg (225 lb) IBW/kg (Calculated) : 79.9 Heparin Dosing Weight: 100kg  Vital Signs: Temp: 98 F (36.7 C) (06/08 0508) Temp Source: Oral (06/07 2305) BP: 109/74 (06/08 0508) Pulse Rate: 87 (06/08 0508)  Labs: Recent Labs    06/17/20 2006 06/17/20 2157 06/18/20 0518  HGB 12.2*  --  11.1*  HCT 39.2  --  35.6*  PLT 370  --  317  LABPROT 12.7  --   --   INR 1.0  --   --   HEPARINUNFRC  --   --  0.23*  CREATININE 0.92  --  0.79  TROPONINIHS 5 5  --     Estimated Creatinine Clearance: 123.3 mL/min (by C-G formula based on SCr of 0.79 mg/dL).   Medical History: Past Medical History:  Diagnosis Date  . Anemia    takes Iron pill daily  . Anxiety    takes Xanax daily, 02/28/20- no longer  . Arthritis   . Asthma   . Cancer ([place])    skin   . Diabetes mellitus ([place])    takes Metformin daily  . Headache    migraine  - not lately  . History of bronchitis    mid 90's  . History of hypertension    lost weight and been off meds over a yr  . History of kidney stones    Passed 32  . History of pleural empyema   . Hypercholesterolemia    lost weight and been off meds over a yr  . Hypertension    . Insomnia    takes Trazodone nightly  . Neuropathy   . Pneumonia    hx of-15+ yrs ago  . Sleep apnea    Stop Bang score of 7  . Weakness    numbness and tingling    Assessment: 60yo male c/o CP since yesterday, troponin negative but D-dimer elevated >> CT reveals bilateral PE w/ mild-moderate clot burden, to begin heparin. Patient to transition to oral eliquis treatment this AM  Goal of Therapy:  Heparin level 0.3-0.7 units/ml Monitor platelets by anticoagulation protocol: Yes   Plan:  D/C heparin Eliquis 10mg  po bid x 7 days, then 5mg  po bid Educate on eliquis Monitor for S/S of bleeding  Isac Sarna, BS [place]ena [place], BCPS Clinical Pharmacist Pager [phone]

## 2020-06-18 NOTE — Progress Notes (Signed)
Pt called out complaining of chest pain. Upon assessment, pt states pain began after moving around in bed during echocardiogram. Pt states hurts to breathe, sharp stabbing pain in his left lateral chest, taking shallow breaths. VSS, see vital signs documentation flowsheet. Skin warm and dry, color appropriate, no SOB, lungs CTA. MD Dyann Kief and pt's nurse Marianna Payment, LPN and Gean Maidens, RN notified of complaint and findings.

## 2020-06-18 NOTE — Progress Notes (Signed)
Patient seen and examined.  Admitted after midnight secondary to shortness of breath and pleuritic chest discomfort; work-up has demonstrated ald7. good oxygen saturation on room air, hemodynamically stable; unfortunately experiencing significant intractable chest discomfort currently.  Patient has an underlying history of chronic pain syndrome for what is anticipated difficulty controlling pain.  Continue anticoagulation management with the use of Eliquis at this time.  Please refer to H&P written by Dr. Clearence Ped for further info/details on admission.  Plan: -adjust analgesic regimen -start robaxin -follow 2-D echo and LE dopplers -hopefully discharge home in am when pain more stable.  Barton Dubois MD [phone]

## 2020-06-18 NOTE — Consult Note (Signed)
I have placed a request via Secure Chat to Dr. Dyann Kief requesting photos of the wound areas of concern to be placed in the EMR.    [place], [place], [place]

## 2020-06-18 NOTE — Progress Notes (Signed)
[facility] night shift.  The patient requested to be given cyclobenzaprine, which she uses to home as he states that methocarbamol has had no effect on his pain.  I have discontinued the methocarbamol and given the patient a single dose of cyclobenzaprine 10 mg p.o.  The primary team will address with the patient treatment options in the morning.  Tennis Must, MD.

## 2020-06-18 NOTE — Progress Notes (Signed)
Pt is complaining of severe pain that has been unresolved by pain medication. He has received 2mg  Dilaudid at 1338 pain was still at a 10. This nurse administered 5mg  oxycodone at 1506. Wife has called out twice since pain medcation informing me that pt is still in unbearable pain. MD notified.

## 2020-06-18 NOTE — Progress Notes (Signed)
[place] for heparin Indication: pulmonary embolus  Allergies  Allergen Reactions  . Penicillins Anaphylaxis and Other (See Comments)    Has patient had a PCN reaction causing immediate rash, facial/tongue/throat swelling, SOB or lightheadedness with hypotension: Yes Has patient had a PCN reaction causing severe rash involving mucus membranes or skin necrosis: Yes Has patient had a PCN reaction that required hospitalization: No Has patient had a PCN reaction occurring within the last 10 years: No If all of the above answers are "NO", then may proceed with Cephalosporin use.   . Vancomycin Anaphylaxis  . Ativan [Lorazepam] Other (See Comments)    Violent and Mean     Patient Measurements: Height: 6\' 1"  (185.4 cm) Weight: 102.1 kg (225 lb) IBW/kg (Calculated) : 79.9 Heparin Dosing Weight: 100kg  Vital Signs: Temp: 98 F (36.7 C) (06/08 0508) Temp Source: Oral (06/07 2305) BP: 109/74 (06/08 0508) Pulse Rate: 87 (06/08 0508)  Labs: Recent Labs    06/17/20 2006 06/17/20 2157 06/18/20 0518  HGB 12.2*  --  11.1*  HCT 39.2  --  35.6*  PLT 370  --  317  LABPROT 12.7  --   --   INR 1.0  --   --   HEPARINUNFRC  --   --  0.23*  CREATININE 0.92  --  0.79  TROPONINIHS 5 5  --     Estimated Creatinine Clearance: 123.3 mL/min (by C-G formula based on SCr of 0.79 mg/dL).   Medical History: Past Medical History:  Diagnosis Date  . Anemia    takes Iron pill daily  . Anxiety    takes Xanax daily, 02/28/20- no longer  . Arthritis   . Asthma   . Cancer ([place])    skin   . Diabetes mellitus ([place])    takes Metformin daily  . Headache    migraine  - not lately  . History of bronchitis    mid 90's  . History of hypertension    lost weight and been off meds over a yr  . History of kidney stones    Passed 32  . History of pleural empyema   . Hypercholesterolemia    lost weight and been off meds over a yr  . Hypertension   . Insomnia     takes Trazodone nightly  . Neuropathy   . Pneumonia    hx of-15+ yrs ago  . Sleep apnea    Stop Bang score of 7  . Weakness    numbness and tingling    Assessment: 60yo male c/o CP since yesterday, troponin negative but D-dimer elevated >> CT reveals bilateral PE w/ mild-moderate clot burden, to begin heparin.  6/8 AM update:  Heparin level low  Goal of Therapy:  Heparin level 0.3-0.7 units/ml Monitor platelets by anticoagulation protocol: Yes   Plan:  Heparin 2000 units bolus Inc heparin to 1800 units/hr 1500 heparin level  Narda Bonds, PharmD, BCPS Clinical Pharmacist Phone: [phone]

## 2020-06-18 NOTE — Consult Note (Signed)
[place] Nurse wound consult note Consultation was completed by review of records, images and assistance from the bedside nurse/clinical staff.   Reason for Consult:LE wound Patient s/p right fem/pop in 2022, debridements of the RLE wound several times with skin substitute placement and NPWT He is followed by [facility] currently for this wound.  S/P toe amputations as well on this LE which have healed Wound type: arterial full thickness ulceration  Pressure Injury POA: NA Measurement: see nursing flow sheet Wound bed:100% pale pink with light fibrinous/slick appearance related to skin substitute use  Drainage (amount, consistency, odor) unable to assess; no drainage in the images  Periwound:intact  Dressing procedure/placement/frequency: Clean RLE wound with saline, pat dry  Cut to fit silver hydrofiber (Aquacel Ag+) and cover wound, top with foam dressing.   Change every other day.   Re consult if needed, will not follow at this time. Thanks  Mervil Wacker [employer], RN,CWOCN, CNS, [place] [phone])

## 2020-06-18 NOTE — H&P (Signed)
TRH H&P    Patient Demographics:    Wayne Pratt, is a 60 y.o. male  MRN: [id number]  DOB - [birth year]  Admit Date - 06/17/2020  Referring MD/NP/PA: Vanita Panda  Outpatient Primary MD for the patient is Leeanne Rio, MD  Patient coming from: Home  Chief complaint- Chest pain   HPI:    Wayne Pratt  is a 60 y.o. male, with history of neuropathy, hypertension, hyperlipidemia, diabetes mellitus type 2, and more presents the ED with a chief complaint of chest pain.  Patient reports that the chest pain started approximately 24 hours prior to arrival.  He was laying in bed when it started.  It was sudden in onset and on the left side.  There was no radiation of the pain.  Deep inspiration made the pain worse.  He is not sure if it was worse with exertion as he has not been ambulating since his peripheral bypass earlier this year.  He reports shortness of breath associated with this pain.  He has had no cough, no hemoptysis.  He has never had a blood clot before.  He has no family history of blood clots to his knowledge.  He does admit to unilateral right leg swelling since his peripheral bypass earlier this year.  Patient reports he has been almost totally immobilized due to a nonhealing wound on his leg, and pain from his peripheral bypass.  No other complaints at this time.  Patient is a current smoker, does not drink alcohol, does not use illicit drugs.  He has had 2 COVID shots.  Patient is full code.  In the ED Temp 98, heart rate 98-1 15, respiratory rate 14-20, blood pressure 109/70, satting at 100% on 2 L nasal cannula Oxygen was applied for comfort, not due to hypoxia Chest x-ray showed mild streaky bibasilar atelectasis No leukocytosis, chemistry panel normal aside from hyperglycemia, Trope 5 EKG shows sinus tachycardia with a rate of 118, QTc 424 COVID pending CTA chest shows acute bilateral pulmonary emboli.   Clot burden is mild to moderate, no right heart strain on CT.  Small pericardial effusion.  Ascending aortic aneurysm that will require outpatient follow-up. Admission requested for further management of acute PE    Review of systems:    In addition to the HPI above,  No Fever-chills, No Headache, No changes with Vision or hearing, No problems swallowing food or Liquids, Admits to chest pain or shortness of breath, no cough No Abdominal pain, No Nausea or Vomiting, no change in bowel habits No Blood in stool or Urine, No dysuria, No new skin rashes or bruises, No new joints pains-aches,  No new weakness, tingling, numbness in any extremity, No recent weight gain or loss, No polyuria, polydypsia or polyphagia, No significant Mental Stressors.  All other systems reviewed and are negative.    Past History of the following :    Past Medical History:  Diagnosis Date  . Anemia    takes Iron pill daily  . Anxiety    takes Xanax daily, 02/28/20- no  longer  . Arthritis   . Asthma   . Cancer ([place])    skin   . Diabetes mellitus ([place])    takes Metformin daily  . Headache    migraine  - not lately  . History of bronchitis    mid 90's  . History of hypertension    lost weight and been off meds over a yr  . History of kidney stones    Passed 32  . History of pleural empyema   . Hypercholesterolemia    lost weight and been off meds over a yr  . Hypertension   . Insomnia    takes Trazodone nightly  . Neuropathy   . Pneumonia    hx of-15+ yrs ago  . Sleep apnea    Stop Bang score of 7  . Weakness    numbness and tingling      Past Surgical History:  Procedure Laterality Date  . ABDOMINAL AORTOGRAM W/LOWER EXTREMITY N/A 01/31/2020   Procedure: ABDOMINAL AORTOGRAM W/LOWER EXTREMITY;  Surgeon: Marty Heck, MD;  Location: [facility] [facility];  Service: Cardiovascular;  Laterality: N/A;  . AMPUTATION Right 02/29/2020   Procedure: RIGHT TOE AMPUTATIONS, 1, 2, 5;   Surgeon: Marty Heck, MD;  Location: [place];  Service: Vascular;  Laterality: Right;  . APPLICATION OF WOUND VAC Right 02/29/2020   Procedure: APPLICATION OF WOUND VAC;  Surgeon: Marty Heck, MD;  Location: [place];  Service: Vascular;  Laterality: Right;  . APPLICATION OF WOUND VAC Right 04/16/2020   Procedure: APPLICATION OF WOUND VAC;  Surgeon: Serafina Mitchell, MD;  Location: [place];  Service: Vascular;  Laterality: Right;  . BIOPSY  10/05/2011   Dr. Oneida Alar :non-erosive gastritis  . BIOPSY  08/06/2014   Procedure: BIOPSY;  Surgeon: Danie Binder, MD;  Location: [facility];  Service: Endoscopy;;  . CERVICAL DISC SURGERY    . ELBOW SURGERY Right   . ENDARTERECTOMY FEMORAL Right 02/04/2020   Procedure: RIGHT FEMORAL ENDARTERECTOMY with Profundaplasty;  Surgeon: Marty Heck, MD;  Location: [place];  Service: Vascular;  Laterality: Right;  . ESOPHAGOGASTRODUODENOSCOPY  Sept 2013   Dr. Oneida Alar: chronic inactive gastritis   . ESOPHAGOGASTRODUODENOSCOPY N/A 08/18/2017   Procedure: ESOPHAGOGASTRODUODENOSCOPY (EGD);  Surgeon: Milus Banister, MD;  Location[facility];  Service: Endoscopy;  Laterality: N/A;  . ESOPHAGOGASTRODUODENOSCOPY N/A 03/09/2018   Procedure: ESOPHAGOGASTRODUODENOSCOPY (EGD);  Surgeon: Milus Banister, MD;  Location: [facility];  Service: Endoscopy;  Laterality: N/A;  . ESOPHAGOGASTRODUODENOSCOPY (EGD) WITH PROPOFOL N/A 08/06/2014   mild non-erosive gastritis, duodenitis  . EUS N/A 08/18/2017   Procedure: UPPER ENDOSCOPIC ULTRASOUND (EUS) RADIAL;  Surgeon: Milus Banister, MD;  Location: [facility];  Service: Endoscopy;  Laterality: N/A;  . EUS N/A 03/09/2018   Procedure: UPPER ENDOSCOPIC ULTRASOUND (EUS) RADIAL;  Surgeon: Milus Banister, MD;  Location[facility];  Service: Endoscopy;  Laterality: N/A;  . FEMORAL-POPLITEAL BYPASS GRAFT Right 02/04/2020   Procedure: BYPASS GRAFT FEMORAL-POPLITEAL ARTERY With Vein Harvest;  Surgeon: Marty Heck, MD;   Location: [place];  Service: Vascular;  Laterality: Right;  . FINE NEEDLE ASPIRATION  03/09/2018   Procedure: FINE NEEDLE ASPIRATION;  Surgeon: Milus Banister, MD;  [facility];  Service: Endoscopy;;  . FLEXIBLE SIGMOIDOSCOPY  10/05/2011   Dr. Fields:poor prep  . I & D EXTREMITY Right 04/16/2020   Procedure: RIGHT CALF WOUND IRRIGATION, DEBRIDEMENT AND GRAFTING;  Surgeon: Serafina Mitchell, MD;  Location: [place];  Service: Vascular;  Laterality: Right;  . LUNG SURGERY  2010   for empyema  . PATCH ANGIOPLASTY Right 02/04/2020   Procedure: PATCH ANGIOPLASTY;  Surgeon: Marty Heck, MD;  Location: [facility];  Service: Vascular;  Laterality: Right;  . SPINAL CORD STIMULATOR INSERTION N/A 08/01/2015   Procedure: Spinal Cord Stimulator Implant;  Surgeon: Eustace Moore, MD;  Location: [place] [facility];  Service: Neurosurgery;  Laterality: N/A;  . TONSILLECTOMY    . VASECTOMY Bilateral 1995  . VEIN HARVEST Right 02/04/2020   Procedure: VEIN HARVEST Greater Saphenous Vein;  Surgeon: Marty Heck, MD;  Location: [place];  Service: Vascular;  Laterality: Right;  . WOUND DEBRIDEMENT Right 02/29/2020   Procedure: RIGHT CALF DEBRIDEMENT;  Surgeon: Marty Heck, MD;  Location[facility];  Service: Vascular;  Laterality: Right;      Social History:      Social History   Tobacco Use  . Smoking status: Former Smoker    Packs/day: 1.00    Years: 39.00    Pack years: 39.00    Types: Cigarettes    Quit date: 02/04/2020    Years since quitting: 0.3  . Smokeless tobacco: Never Used  Substance Use Topics  . Alcohol use: No    Alcohol/week: 0.0 standard drinks       Family History :     Family History  Problem Relation Age of Onset  . Prostate cancer Other        all male members on mother's side  . Heart attack Father   . COPD Maternal Grandmother   . Cancer Paternal Grandmother   . Colon cancer Neg Hx       Home Medications:   Prior to Admission medications   Medication  Sig Start Date End Date Taking? Authorizing Provider  acetaminophen (TYLENOL) 650 MG CR tablet Take 1,300 mg by mouth every 8 (eight) hours as needed for pain.   Yes [provider]  albuterol (VENTOLIN HFA) 108 (90 Base) MCG/ACT inhaler Inhale 1-2 puffs into the lungs every 6 (six) hours as needed for wheezing or shortness of breath. 03/15/19  Yes Avegno, Darrelyn Hillock, FNP  aspirin EC 81 MG EC tablet Take 1 tablet (81 mg total) by mouth daily at 6 (six) AM. Swallow whole. 02/08/20  Yes Dagoberto Ligas, PA-C  clonazePAM (KLONOPIN) 0.5 MG tablet Take 0.5 mg by mouth 2 (two) times daily.  06/17/17  Yes [provider]  cyclobenzaprine (FLEXERIL) 10 MG tablet Take 10 mg by mouth 3 (three) times daily. 04/14/17  Yes [provider]  GLIPIZIDE XL 10 MG 24 hr tablet Take 10 mg by mouth 2 (two) times daily. 01/17/19  Yes [provider]  HYDROmorphone (DILAUDID) 4 MG tablet Take 4 mg by mouth 4 (four) times daily as needed. 06/12/20  Yes [provider]  IBU 800 MG tablet Take 800 mg by mouth every 8 (eight) hours as needed for moderate pain. 01/20/18  Yes [provider]  lisinopril (ZESTRIL) 10 MG tablet Take 1 tablet (10 mg total) by mouth daily. Please call to schedule appointment for further refills. Thanks! 04/24/20  Yes Donato Heinz, MD  lovastatin (MEVACOR) 40 MG tablet Take 40 mg by mouth at bedtime.  04/01/17  Yes [provider]  Melatonin 10 MG TABS Take 40 mg by mouth at bedtime.   Yes [provider]  metFORMIN (GLUCOPHAGE) 1000 MG tablet Take 1,000 mg by mouth 2 (two) times daily.  04/01/17  Yes [provider]  pioglitazone (ACTOS) 15 MG tablet Take 15 mg by mouth at bedtime. 01/17/19  Yes [provider]  morphine (MSIR) 15 MG tablet Take 1 tablet by mouth every 4 (four) hours as needed for pain. Patient not taking: No sig reported 11/14/18   Nicholaus Bloom, MD  Oxycodone HCl 10 MG TABS Take by mouth. Patient  not taking: No sig reported 05/26/20   [provider]     Allergies:     Allergies  Allergen Reactions  . Penicillins Anaphylaxis and Other (See Comments)    Has patient had a PCN reaction causing immediate rash, facial/tongue/throat swelling, SOB or lightheadedness with hypotension: Yes Has patient had a PCN reaction causing severe rash involving mucus membranes or skin necrosis: Yes Has patient had a PCN reaction that required hospitalization: No Has patient had a PCN reaction occurring within the last 10 years: No If all of the above answers are "NO", then may proceed with Cephalosporin use.   . Vancomycin Anaphylaxis  . Ativan [Lorazepam] Other (See Comments)    Violent and Mean      Physical Exam:   Vitals  Blood pressure (!) 148/85, pulse 90, temperature 98.2 F (36.8 C), temperature source Oral, resp. rate 20, height 6\' 1"  (1.854 m), weight 102.1 kg, SpO2 99 %.  1.  General: Patient lying supine in bed,  no acute distress   2. Psychiatric: Alert and oriented x 3, mood and behavior normal for situation, pleasant and cooperative with exam   3. Neurologic: Speech and language are normal, face is symmetric, moves all 4 extremities voluntarily, at baseline without acute deficits on limited exam   4. HEENMT:  Head is atraumatic, normocephalic, pupils reactive to light, neck is supple, trachea is midline, mucous membranes are moist   5. Respiratory : Lungs are clear to auscultation bilaterally without wheezing, rhonchi, rales, no cyanosis, no increase in work of breathing or accessory muscle use   6. Cardiovascular : Heart rate tachycardic,, rhythm is regular, no murmurs, rubs or gallops, no peripheral edema, peripheral pulses palpated   7. Gastrointestinal:  Abdomen is soft, nondistended, nontender to palpation bowel sounds active, no masses or organomegaly palpated   8. Skin:  Skin is warm, dry and intact without rashes Lesion on right lower extremity has  a clean dry and intact dressing applied   9.Musculoskeletal:  No acute deformities or trauma, no asymmetry in tone, no peripheral edema, peripheral pulses palpated, no tenderness to palpation in the extremities     Data Review:    CBC Recent Labs  Lab 06/17/20 2006  WBC 9.2  HGB 12.2*  HCT 39.2  PLT 370  MCV 94.2  MCH 29.3  MCHC 31.1  RDW 13.2  LYMPHSABS 2.9  MONOABS 0.8  EOSABS 0.2  BASOSABS 0.1   ------------------------------------------------------------------------------------------------------------------  Results for orders placed or performed during the hospital encounter of 06/17/20 (from the past 48 hour(s))  Comprehensive metabolic panel     Status: Abnormal   Collection Time: 06/17/20  8:06 PM  Result Value Ref Range   Sodium 136 135 - 145 mmol/L   Potassium 4.4 3.5 - 5.1 mmol/L   Chloride 103 98 - 111 mmol/L   CO2 24 22 - 32 mmol/L   Glucose, Bld 203 (H) 70 - 99 mg/dL    Comment: Glucose reference range applies only to samples taken after fasting for at least 8 hours.   BUN 17 6 - 20 mg/dL   Creatinine, Ser 0.92  0.61 - 1.24 mg/dL   Calcium 9.1 8.9 - 10.3 mg/dL   Total Protein 7.4 6.5 - 8.1 g/dL   Albumin 3.8 3.5 - 5.0 g/dL   AST 18 15 - 41 U/L   ALT 11 0 - 44 U/L   Alkaline Phosphatase 82 38 - 126 U/L   Total Bilirubin 0.3 0.3 - 1.2 mg/dL   GFR, Estimated >60 >60 mL/min    Comment: (NOTE) Calculated using the CKD-EPI Creatinine Equation (2021)    Anion gap 9 5 - 15    Comment: Performed at [facility], [address] [place], [place] [address]  Magnesium     Status: None   Collection Time: 06/17/20  8:06 PM  Result Value Ref Range   Magnesium 1.7 1.7 - 2.4 mg/dL    Comment: Performed at [facility], [address], [place], [place] [address]  Troponin I (High Sensitivity)     Status: None   Collection Time: 06/17/20  8:06 PM  Result Value Ref Range   Troponin I (High Sensitivity) 5 <18 ng/L    Comment: (NOTE) Elevated high sensitivity  troponin I (hsTnI) values and significant  changes across serial measurements may suggest ACS but many other  chronic and acute conditions are known to elevate hsTnI results.  Refer to the "Links" section for chest pain algorithms and additional  guidance. Performed at [facility], [address] [place], [place] [address]   Brain natriuretic peptide (order if patient c/o SOB ONLY)     Status: None   Collection Time: 06/17/20  8:06 PM  Result Value Ref Range   B Natriuretic Peptide 29.0 0.0 - 100.0 pg/mL    Comment: Performed at [facility], [address], [place],  [address]  CBC with Differential/Platelet     Status: Abnormal   Collection Time: 06/17/20  8:06 PM  Result Value Ref Range   WBC 9.2 4.0 - 10.5 K/uL   RBC 4.16 (L) 4.22 - 5.81 MIL/uL   Hemoglobin 12.2 (L) 13.0 - 17.0 g/dL   HCT 39.2 39.0 - 52.0 %   MCV 94.2 80.0 - 100.0 fL   MCH 29.3 26.0 - 34.0 pg   MCHC 31.1 30.0 - 36.0 g/dL   RDW 13.2 11.5 - 15.5 %   Platelets 370 150 - 400 K/uL   nRBC 0.0 0.0 - 0.2 %   Neutrophils Relative % 58 %   Neutro Abs 5.2 1.7 - 7.7 K/uL   Lymphocytes Relative 31 %   Lymphs Abs 2.9 0.7 - 4.0 K/uL   Monocytes Relative 8 %   Monocytes Absolute 0.8 0.1 - 1.0 K/uL   Eosinophils Relative 2 %   Eosinophils Absolute 0.2 0.0 - 0.5 K/uL   Basophils Relative 1 %   Basophils Absolute 0.1 0.0 - 0.1 K/uL   Immature Granulocytes 0 %   Abs Immature Granulocytes 0.03 0.00 - 0.07 K/uL    Comment: Performed at [facility], [address] [place],  [address]  Protime-INR     Status: None   Collection Time: 06/17/20  8:06 PM  Result Value Ref Range   Prothrombin Time 12.7 11.4 - 15.2 seconds   INR 1.0 0.8 - 1.2    Comment: (NOTE) INR goal varies based on device and disease states. Performed at [facility], [address], [place],  [address]   D-dimer, quantitative     Status: Abnormal   Collection Time: 06/17/20  8:06 PM  Result Value Ref Range   D-Dimer,  Quant 1.69 (H)  0.00 - 0.50 ug/mL-FEU    Comment: (NOTE) At the manufacturer cut-off value of 0.5 g/mL FEU, this assay has a negative predictive value of 95-100%.This assay is intended for use in conjunction with a clinical pretest probability (PTP) assessment model to exclude pulmonary embolism (PE) and deep venous thrombosis (DVT) in outpatients suspected of PE or DVT. Results should be correlated with clinical presentation. Performed at [facility], [address], [place], [place] [address]   CBG monitoring, ED     Status: Abnormal   Collection Time: 06/17/20  8:22 PM  Result Value Ref Range   Glucose-Capillary 191 (H) 70 - 99 mg/dL    Comment: Glucose reference range applies only to samples taken after fasting for at least 8 hours.  Troponin I (High Sensitivity)     Status: None   Collection Time: 06/17/20  9:57 PM  Result Value Ref Range   Troponin I (High Sensitivity) 5 <18 ng/L    Comment: (NOTE) Elevated high sensitivity troponin I (hsTnI) values and significant  changes across serial measurements may suggest ACS but many other  chronic and acute conditions are known to elevate hsTnI results.  Refer to the "Links" section for chest pain algorithms and additional  guidance. Performed at [facility], [address], [place], [place] [address]   Glucose, capillary     Status: Abnormal   Collection Time: 06/17/20 11:10 PM  Result Value Ref Range   Glucose-Capillary 145 (H) 70 - 99 mg/dL    Comment: Glucose reference range applies only to samples taken after fasting for at least 8 hours.    Chemistries  Recent Labs  Lab 06/17/20 2006  NA 136  K 4.4  CL 103  CO2 24  GLUCOSE 203*  BUN 17  CREATININE 0.92  CALCIUM 9.1  MG 1.7  AST 18  ALT 11  ALKPHOS 82  BILITOT 0.3    ------------------------------------------------------------------------------------------------------------------  ------------------------------------------------------------------------------------------------------------------ GFR: Estimated Creatinine Clearance: 107.2 mL/min (by C-G formula based on SCr of 0.92 mg/dL). Liver Function Tests: Recent Labs  Lab 06/17/20 2006  AST 18  ALT 11  ALKPHOS 82  BILITOT 0.3  PROT 7.4  ALBUMIN 3.8   No results for input(s): LIPASE, AMYLASE in the last 168 hours. No results for input(s): AMMONIA in the last 168 hours. Coagulation Profile: Recent Labs  Lab 06/17/20 2006  INR 1.0   Cardiac Enzymes: No results for input(s): CKTOTAL, CKMB, CKMBINDEX, TROPONINI in the last 168 hours. BNP (last 3 results) No results for input(s): PROBNP in the last 8760 hours. HbA1C: No results for input(s): HGBA1C in the last 72 hours. CBG: Recent Labs  Lab 06/17/20 2022 06/17/20 2310  GLUCAP 191* 145*   Lipid Profile: No results for input(s): CHOL, HDL, LDLCALC, TRIG, CHOLHDL, LDLDIRECT in the last 72 hours. Thyroid Function Tests: No results for input(s): TSH, T4TOTAL, FREET4, T3FREE, THYROIDAB in the last 72 hours. Anemia Panel: No results for input(s): VITAMINB12, FOLATE, FERRITIN, TIBC, IRON, RETICCTPCT in the last 72 hours.  --------------------------------------------------------------------------------------------------------------- Urine analysis:    Component Value Date/Time   COLORURINE YELLOW 01/31/2020 1520   APPEARANCEUR CLEAR 01/31/2020 1520   LABSPEC 1.014 01/31/2020 1520   PHURINE 5.0 01/31/2020 1520   GLUCOSEU 50 (A) 01/31/2020 1520   HGBUR NEGATIVE 01/31/2020 1520   BILIRUBINUR NEGATIVE 01/31/2020 [place] 01/31/2020 1520   PROTEINUR 100 (A) 01/31/2020 1520   UROBILINOGEN 0.2 03/22/2008 0900   NITRITE NEGATIVE 01/31/2020 [place] 01/31/2020  1520      Imaging Results:    CT  Angio Chest PE W/Cm &/Or Wo Cm  Result Date: 06/17/2020 CLINICAL DATA:  PE suspected, high prob Chest pain. EXAM: CT ANGIOGRAPHY CHEST WITH CONTRAST TECHNIQUE: Multidetector CT imaging of the chest was performed using the standard protocol during bolus administration of intravenous contrast. Multiplanar CT image reconstructions and MIPs were obtained to evaluate the vascular anatomy. CONTRAST:  172mL OMNIPAQUE IOHEXOL 350 MG/ML SOLN COMPARISON:  Radiograph earlier today.  Chest CTA 01/18/2019 FINDINGS: Cardiovascular: Positive for acute bilateral pulmonary emboli. There are filling defects within the lingular lobe are and left lower segmental and subsegmental branches. Filling defects in the right segmental middle lobe and subsegmental lower lobe branches. Overall clot burden is mild-to-moderate. There is no right heart strain, RV to LV ratio is less than 1. Coronary artery calcifications/stents. Ascending aortic aneurysm at 4 cm. No aortic dissection. Small pericardial effusion. Mediastinum/Nodes: No enlarged mediastinal or hilar lymph nodes. No esophageal wall thickening. No thyroid nodule. Lungs/Pleura: No pulmonary infarct or acute airspace disease. Mild atelectasis or scarring in the lingula. Subsegmental atelectasis in the dependent right lower lobe. No pleural fluid. Trachea and central bronchi are patent. Upper Abdomen: Hepatic hypodensities unchanged from prior and typical of cyst. Calcified granuloma. No acute upper abdominal findings. Musculoskeletal: Partial fusion of right lateral fourth and fifth ribs. Thoracic scoliosis and spondylosis. Spinal stimulator in place. Review of the MIP images confirms the above findings. IMPRESSION: 1. Positive for acute bilateral pulmonary emboli. Overall clot burden is mild-to-moderate, but no right heart strain. 2. Small pericardial effusion. 3. Ascending aortic aneurysm at 4 cm. Recommend annual imaging followup by CTA or MRA. This recommendation follows 2010  ACCF/AHA/AATS/ACR/ASA/SCA/SCAI/SIR/STS/SVM Guidelines for the Diagnosis and Management of Patients with Thoracic Aortic Disease. Circulation. 2010; 121: H631-S970. Aortic aneurysm NOS (ICD10-I71.9) Aortic Atherosclerosis (ICD10-I70.0). Critical Value/emergent results were called by telephone at the time of interpretation on 06/17/2020 at 9:29 pm to provider Carmin Muskrat , who verbally acknowledged these results. Electronically Signed   By: Keith Rake M.D.   On: 06/17/2020 21:30   DG Chest Portable 1 View  Result Date: 06/17/2020 CLINICAL DATA:  Left-sided chest pain. EXAM: PORTABLE CHEST 1 VIEW COMPARISON:  Radiograph 03/15/2019 FINDINGS: The cardiomediastinal contours are normal. Mild streaky bibasilar atelectasis. Pulmonary vasculature is normal. No consolidation, pleural effusion, or pneumothorax. No acute osseous abnormalities are seen. Spinal stimulator in place. There are overlying telemetry leads. IMPRESSION: Mild streaky bibasilar atelectasis. Electronically Signed   By: Keith Rake M.D.   On: 06/17/2020 20:36     Assessment & Plan:    Principal Problem:   Acute pulmonary embolism ([place]) Active Problems:   Diabetes mellitus ([place])   Hypertension   Anxiety   HLD (hyperlipidemia)   1. Acute pulm emboli 1. CT shows BL Pulm emboli 2. Provoked by immobilization since peripheral bypass rt lower extremity earlier this year 3. Started on heparin 4. Echo pending 5. BL [place] lower extremities to assess clot burden 6. Monitor on tele 2. Ascending aortic aneurysm 1. At 4 cm 2. Will require outpatient follow-up 3. DMII 1. Sliding scale coverage 2. Heart healthy/DMII diet 3. Last Hgb A1C 9.6 4. Continue to monitor 4. HTN 1. Continue lisinopril 5. HLD 1. Continue lovastatin 6. PVD 1. Continue aspirin and statin 7. Anxiety 1. Continue klonopin   DVT Prophylaxis-   heparin - SCDs   AM Labs Ordered, also please review Full Orders  Family Communication: Admission, patients  condition and plan  of care including tests being ordered have been discussed with the patient and wife who indicate understanding and agree with the plan and Code Status.  Code Status: FULL  Admission status: Inpatient :The appropriate admission status for this patient is INPATIENT. Inpatient status is judged to be reasonable and necessary in order to provide the required intensity of service to ensure the patient's safety. The patient's presenting symptoms, physical exam findings, and initial radiographic and laboratory data in the context of their chronic comorbidities is felt to place them at high risk for further clinical deterioration. Furthermore, it is not anticipated that the patient will be medically stable for discharge from the hospital within 2 midnights of admission. The following factors support the admission status of inpatient.     The patient's presenting symptoms include pleuritic chest pains The worrisome physical exam findings include hypoxia The initial radiographic and laboratory data are worrisome because of CT shows BL pulm emboli The chronic co-morbidities include PVD, HTN, HLD, DMII       * I certify that at the point of admission it is my clinical judgment that the patient will require inpatient hospital care spanning beyond 2 midnights from the point of admission due to high intensity of service, high risk for further deterioration and high frequency of surveillance required.*  Time spent in minutes : [place]

## 2020-06-19 DIAGNOSIS — I2699 Other pulmonary embolism without acute cor pulmonale: Principal | ICD-10-CM

## 2020-06-19 DIAGNOSIS — F419 Anxiety disorder, unspecified: Secondary | ICD-10-CM

## 2020-06-19 DIAGNOSIS — I10 Essential (primary) hypertension: Secondary | ICD-10-CM

## 2020-06-19 DIAGNOSIS — E1159 Type 2 diabetes mellitus with other circulatory complications: Secondary | ICD-10-CM

## 2020-06-19 LAB — GLUCOSE, CAPILLARY
Glucose-Capillary: 162 mg/dL — ABNORMAL HIGH (ref 70–99)
Glucose-Capillary: 149 mg/dL — ABNORMAL HIGH (ref 70–99)

## 2020-06-19 LAB — HEMOGLOBIN A1C
Hgb A1c MFr Bld: 7.5 % — ABNORMAL HIGH (ref 4.8–5.6)
Mean Plasma Glucose: 169 mg/dL

## 2020-06-19 LAB — CBC
HCT: 36.5 % — ABNORMAL LOW (ref 39.0–52.0)
Hemoglobin: 11.4 g/dL — ABNORMAL LOW (ref 13.0–17.0)
MCH: 29.5 pg (ref 26.0–34.0)
MCHC: 31.2 g/dL (ref 30.0–36.0)
MCV: 94.6 fL (ref 80.0–100.0)
Platelets: 310 10*3/uL (ref 150–400)
RBC: 3.86 MIL/uL — ABNORMAL LOW (ref 4.22–5.81)
RDW: 13.1 % (ref 11.5–15.5)
WBC: 6.4 10*3/uL (ref 4.0–10.5)
nRBC: 0 % (ref 0.0–0.2)

## 2020-06-19 MED ORDER — OXYCODONE HCL 10 MG PO TABS: 10.0000 mg | ORAL_TABLET | Freq: Four times a day (QID) | ORAL | 0 refills | Status: DC | PRN

## 2020-06-19 MED ORDER — APIXABAN (ELIQUIS) VTE STARTER PACK (10MG AND 5MG): ORAL_TABLET | ORAL | 0 refills | Status: DC

## 2020-06-19 MED ORDER — APIXABAN 5 MG PO TABS: 5.0000 mg | ORAL_TABLET | Freq: Two times a day (BID) | ORAL | 2 refills | Status: DC

## 2020-06-19 NOTE — Discharge Summary (Signed)
Physician Discharge Summary  Wayne Pratt:[id number] DOB: 06-Dec-1960 DOA: 06/17/2020  PCP: Leeanne Rio, MD  Admit date: 06/17/2020 Discharge date: 06/19/2020  Time spent: 35 minutes  Recommendations for Outpatient Follow-up:  Repeat CBC to follow hemoglobin and platelets strength/stability Repeat basic metabolic panel to follow electrolytes and renal function.  Close monitoring of patient's CBGs with further adjustment to hypoglycemia regimen as required. Reassess blood pressure to further adjust antihypertensive treatment as required.  Discharge Diagnoses:  Principal Problem:   Bilateral pulmonary embolism (HCC) Active Problems:   Diabetes mellitus ([place])   Hypertension   Anxiety   HLD (hyperlipidemia)   Discharge Condition: Stable and improved.  Discharged home with instructions to follow-up with PCP in 10 days.  CODE STATUS: Full code  Diet recommendation: Heart healthy modified carbohydrate diet.  Filed Weights   06/17/20 2011  Weight: 102.1 kg    History of present illness:  As per H&P written By Dr.Zierle-Ghosh 06/18/[name]  is a 60 y.o. male, with history of neuropathy, hypertension, hyperlipidemia, diabetes mellitus type 2, and more presents the ED with a chief complaint of chest pain.  Patient reports that the chest pain started approximately 24 hours prior to arrival.  He was laying in bed when it started.  It was sudden in onset and on the left side.  There was no radiation of the pain.  Deep inspiration made the pain worse.  He is not sure if it was worse with exertion as he has not been ambulating since his peripheral bypass earlier this year.  He reports shortness of breath associated with this pain.  He has had no cough, no hemoptysis.  He has never had a blood clot before.  He has no family history of blood clots to his knowledge.  He does admit to unilateral right leg swelling since his peripheral bypass earlier this year.  Patient reports he has been  almost totally immobilized due to a nonhealing wound on his leg, and pain from his peripheral bypass.  No other complaints at this time.   Patient is a current smoker, does not drink alcohol, does not use illicit drugs.  He has had 2 COVID shots.  Patient is full code.   In the ED Temp 98, heart rate 98-1 15, respiratory rate 14-20, blood pressure 109/70, satting at 100% on 2 L nasal cannula Oxygen was applied for comfort, not due to hypoxia Chest x-ray showed mild streaky bibasilar atelectasis No leukocytosis, chemistry panel normal aside from hyperglycemia, Trope 5 EKG shows sinus tachycardia with a rate of 118, QTc 424 COVID pending CTA chest shows acute bilateral pulmonary emboli.  Clot burden is mild to moderate, no right heart strain on CT.  Small pericardial effusion.  Ascending aortic aneurysm that will require outpatient follow-up. Admission requested for further management of acute PE.  Hospital Course:  1-acute bilateral pulmonary embolism: no cor pulmonale -Echo demonstrating no right heart strain -Lower extremity Dopplers negative for DVT -Will continue treatment with Eliquis -Continue as needed analgesics, incentive spirometer and outpatient follow-up.  2-grade 1 diastolic dysfunction -Chronic and compensated -Appreciated on 2D echo evaluation during this admission -Advised to follow heart healthy diet and to check daily weights.  3-ascending aortic aneurysm -At 4 cm -Outpatient follow-up with vascular surgery and repeat images in 6-54-month recommended.  4-type 2 diabetes mellitus -Last hemoglobin A1c 9.6 demonstrating good control -Modify carbohydrate diet encouraged.  -We will resume home oral hypoglycemic agents; follow-up with PCP to further adjust hypoglycemic  therapy.  5-hypertension -Stable and well-controlled -Continue current antihypertensive medications. -Diet healthy diet encouraged.  6-hyperlipidemia -Continue statins.  7-history of peripheral  vascular disease -Continue aspirin and statins -Continue good control of diabetes.  8-anxiety -Stable mood -Continue Klonopin.  9-chronic pain syndrome/neuropathy -Home analgesic regimen continue outpatient follow-up with pain clinic management.   Procedures: Lower extremity Dopplers: No DVT appreciated.   2D echo:  1. Left ventricular ejection fraction, by estimation, is 50 to 55%. The  left ventricle has low normal function. The left ventricle has no regional  wall motion abnormalities. The left ventricular internal cavity size was  mildly dilated. There is mild  left ventricular hypertrophy. Left ventricular diastolic parameters are  consistent with Grade I diastolic dysfunction (impaired relaxation).   2. Right ventricular systolic function is normal. The right ventricular  size is normal.   3. The mitral valve is normal in structure. Trivial mitral valve  regurgitation.   4. Aortic valve regurgitation is not visualized. Mild aortic valve  sclerosis is present, with no evidence of aortic valve stenosis.   Consultations: None  Discharge Exam: Vitals:   06/19/20 0351 06/19/20 1017  BP: 122/73 118/86  Pulse: 93 (!) 114  Resp: 19 20  Temp: 97.7 F (36.5 C)   SpO2: 97% 96%    General: Afebrile, no nausea, no vomiting, reports no shortness of breath and demonstrate good oxygen saturation.  Still complaining of intermittent pleuritic chest discomfort. Cardiovascular: S1 and S2, no gallops, no gallops, no JVD on exam. Respiratory: Good air movement bilaterally; no wheezing, no crackles, no using accessory muscles. Abdomen: Soft, nontender, nontender, positive bowel sounds Extremities: Right lower extremity wound without active drainage or saturation (refer to epic media for images); no cyanosis or clubbing.  Discharge Instructions   Discharge Instructions     Diet - low sodium heart healthy   Complete by: As directed    Discharge instructions   Complete by: As  directed    Take medications as prescribed Maintain adequate hydration Complete anticoagulation loading dose as instructed  Arrange follow up with PCP in 10 days   Discharge wound care:   Complete by: As directed    Following recommendations by wound care service: clean right lower extremity wound with saline, pat dry and cover to feed silver Hydrofiber (Aquacel AG plus) and cover wound, topped with sterile dressing and change every other day.   Increase activity slowly   Complete by: As directed       Allergies as of 06/19/2020       Reactions   Penicillins Anaphylaxis, Other (See Comments)   Has patient had a PCN reaction causing immediate rash, facial/tongue/throat swelling, SOB or lightheadedness with hypotension: Yes Has patient had a PCN reaction causing severe rash involving mucus membranes or skin necrosis: Yes Has patient had a PCN reaction that required hospitalization: No Has patient had a PCN reaction occurring within the last 10 years: No If all of the above answers are "NO", then may proceed with Cephalosporin use.   Vancomycin Anaphylaxis   Ativan [lorazepam] Other (See Comments)   Violent and Mean         Medication List     STOP taking these medications    cyclobenzaprine 10 MG tablet Commonly known as: FLEXERIL   IBU 800 MG tablet Generic drug: ibuprofen   morphine 15 MG tablet Commonly known as: MSIR       TAKE these medications    acetaminophen 650 MG CR tablet Commonly known as:  TYLENOL Take 1,300 mg by mouth every 8 (eight) hours as needed for pain.   albuterol 108 (90 Base) MCG/ACT inhaler Commonly known as: VENTOLIN HFA Inhale 1-2 puffs into the lungs every 6 (six) hours as needed for wheezing or shortness of breath.   Apixaban Starter Pack (10mg  and 5mg ) Commonly known as: ELIQUIS STARTER PACK Take as directed on package: start with two-5mg  tablets twice daily for 7 days. On day 8, switch to one-5mg  tablet twice daily.   apixaban 5  MG Tabs tablet Commonly known as: ELIQUIS Take 1 tablet (5 mg total) by mouth 2 (two) times daily. Start taking on: June 25, 2020   aspirin 81 MG EC tablet Take 1 tablet (81 mg total) by mouth daily at 6 (six) AM. Swallow whole.   clonazePAM 0.5 MG tablet Commonly known as: KLONOPIN Take 0.5 mg by mouth 2 (two) times daily.   glipiZIDE XL 10 MG 24 hr tablet Generic drug: glipiZIDE Take 10 mg by mouth 2 (two) times daily.   HYDROmorphone 4 MG tablet Commonly known as: DILAUDID Take 4 mg by mouth 4 (four) times daily as needed.   lisinopril 10 MG tablet Commonly known as: ZESTRIL Take 1 tablet (10 mg total) by mouth daily. Please call to schedule appointment for further refills. Thanks!   lovastatin 40 MG tablet Commonly known as: MEVACOR Take 40 mg by mouth at bedtime.   Melatonin 10 MG Tabs Take 40 mg by mouth at bedtime.   metFORMIN 1000 MG tablet Commonly known as: GLUCOPHAGE Take 1,000 mg by mouth 2 (two) times daily.   Oxycodone HCl 10 MG Tabs Take 1 tablet (10 mg total) by mouth every 6 (six) hours as needed for breakthrough pain. What changed:  how much to take when to take this reasons to take this   pioglitazone 15 MG tablet Commonly known as: ACTOS Take 15 mg by mouth at bedtime.               Discharge Care Instructions  (From admission, onward)           Start     Ordered   06/19/20 0000  Discharge wound care:       Comments: Following recommendations by wound care service: clean right lower extremity wound with saline, pat dry and cover to feed silver Hydrofiber (Aquacel AG plus) and cover wound, topped with sterile dressing and change every other day.   06/19/20 1516           Allergies  Allergen Reactions   Penicillins Anaphylaxis and Other (See Comments)    Has patient had a PCN reaction causing immediate rash, facial/tongue/throat swelling, SOB or lightheadedness with hypotension: Yes Has patient had a PCN reaction causing  severe rash involving mucus membranes or skin necrosis: Yes Has patient had a PCN reaction that required hospitalization: No Has patient had a PCN reaction occurring within the last 10 years: No If all of the above answers are "NO", then may proceed with Cephalosporin use.    Vancomycin Anaphylaxis   Ativan [Lorazepam] Other (See Comments)    Violent and Mean     Follow-up Information     Leeanne Rio, MD. Schedule an appointment as soon as possible for a visit in 10 day(s).   Specialty: Family Medicine Contact information: [place] [address] [phone]                 The results of significant diagnostics from this  hospitalization (including imaging, microbiology, ancillary and laboratory) are listed below for reference.    Significant Diagnostic Studies: CT Angio Chest PE W/Cm &/Or Wo Cm  Result Date: 06/17/2020 CLINICAL DATA:  PE suspected, high prob Chest pain. EXAM: CT ANGIOGRAPHY CHEST WITH CONTRAST TECHNIQUE: Multidetector CT imaging of the chest was performed using the standard protocol during bolus administration of intravenous contrast. Multiplanar CT image reconstructions and MIPs were obtained to evaluate the vascular anatomy. CONTRAST:  156mL OMNIPAQUE IOHEXOL 350 MG/ML SOLN COMPARISON:  Radiograph earlier today.  Chest CTA 01/18/2019 FINDINGS: Cardiovascular: Positive for acute bilateral pulmonary emboli. There are filling defects within the lingular lobe are and left lower segmental and subsegmental branches. Filling defects in the right segmental middle lobe and subsegmental lower lobe branches. Overall clot burden is mild-to-moderate. There is no right heart strain, RV to LV ratio is less than 1. Coronary artery calcifications/stents. Ascending aortic aneurysm at 4 cm. No aortic dissection. Small pericardial effusion. Mediastinum/Nodes: No enlarged mediastinal or hilar lymph nodes. No esophageal wall thickening. No thyroid nodule.  Lungs/Pleura: No pulmonary infarct or acute airspace disease. Mild atelectasis or scarring in the lingula. Subsegmental atelectasis in the dependent right lower lobe. No pleural fluid. Trachea and central bronchi are patent. Upper Abdomen: Hepatic hypodensities unchanged from prior and typical of cyst. Calcified granuloma. No acute upper abdominal findings. Musculoskeletal: Partial fusion of right lateral fourth and fifth ribs. Thoracic scoliosis and spondylosis. Spinal stimulator in place. Review of the MIP images confirms the above findings. IMPRESSION: 1. Positive for acute bilateral pulmonary emboli. Overall clot burden is mild-to-moderate, but no right heart strain. 2. Small pericardial effusion. 3. Ascending aortic aneurysm at 4 cm. Recommend annual imaging followup by CTA or MRA. This recommendation follows 2010 ACCF/AHA/AATS/ACR/ASA/SCA/SCAI/SIR/STS/SVM Guidelines for the Diagnosis and Management of Patients with Thoracic Aortic Disease. Circulation. 2010; 121: G017-C944. Aortic aneurysm NOS (ICD10-I71.9) Aortic Atherosclerosis (ICD10-I70.0). Critical Value/emergent results were called by telephone at the time of interpretation on 06/17/2020 at 9:29 pm to provider Carmin Muskrat , who verbally acknowledged these results. Electronically Signed   By: Keith Rake M.D.   On: 06/17/2020 21:30   US Venous Img Lower Bilateral (DVT)  Result Date: 06/18/2020 CLINICAL DATA:  Pulmonary embolism Chest pain EXAM: BILATERAL LOWER EXTREMITY VENOUS DOPPLER ULTRASOUND TECHNIQUE: Gray-scale sonography with compression, as well as color and duplex ultrasound, were performed to evaluate the deep venous system(s) from the level of the common femoral vein through the popliteal and proximal calf veins. COMPARISON:  None. FINDINGS: VENOUS Normal compressibility of the common femoral, superficial femoral, and popliteal veins, as well as the visualized calf veins. Visualized portions of profunda femoral vein and great  saphenous vein unremarkable. No filling defects to suggest DVT on grayscale or color Doppler imaging. Doppler waveforms show normal direction of venous flow, normal respiratory plasticity and response to augmentation. OTHER Atherosclerotic calcifications seen throughout the lower extremity arteries. Limitations: none IMPRESSION: No lower extremity DVT Electronically Signed   By: Miachel Roux M.D.   On: 06/18/2020 12:33   DG Chest Portable 1 View  Result Date: 06/17/2020 CLINICAL DATA:  Left-sided chest pain. EXAM: PORTABLE CHEST 1 VIEW COMPARISON:  Radiograph 03/15/2019 FINDINGS: The cardiomediastinal contours are normal. Mild streaky bibasilar atelectasis. Pulmonary vasculature is normal. No consolidation, pleural effusion, or pneumothorax. No acute osseous abnormalities are seen. Spinal stimulator in place. There are overlying telemetry leads. IMPRESSION: Mild streaky bibasilar atelectasis. Electronically Signed   By: Keith Rake M.D.   On: 06/17/2020 20:36   ECHOCARDIOGRAM  COMPLETE  Result Date: 06/18/2020    ECHOCARDIOGRAM REPORT   Patient Name:   DEVARIO BUCKLEW [facility] Date of Exam: 06/18/2020 Medical Rec #:  [id number]   Height:       73.0 in Accession #:    [id number]  Weight:       225.0 lb Date of Birth:  October 23, 1960  BSA:          2.262 m Patient Age:    35 years    BP:           109/74 mmHg Patient Gender: M           HR:           87 bpm. Exam Location:  [facility] Procedure: 2D Echo, Cardiac Doppler and Color Doppler Indications:    Pulmonary Embolus I26.09  History:        Patient has prior history of Echocardiogram examinations, most                 recent 02/14/2019. Risk Factors:Hypertension, Diabetes and                 Dyslipidemia. Hx of Abdominal pain, epigastric.  Sonographer:    Alvino Chapel RCS Referring Phys: [id number] ASIA B [place]  Sonographer Comments: Suboptimal subcostal window. Patient was in extreme pain from beginning of echo due to his pulomnary embolus he stated. Especially the  suncostal windows. IMPRESSIONS  1. Left ventricular ejection fraction, by estimation, is 50 to 55%. The left ventricle has low normal function. The left ventricle has no regional wall motion abnormalities. The left ventricular internal cavity size was mildly dilated. There is mild left ventricular hypertrophy. Left ventricular diastolic parameters are consistent with Grade I diastolic dysfunction (impaired relaxation).  2. Right ventricular systolic function is normal. The right ventricular size is normal.  3. The mitral valve is normal in structure. Trivial mitral valve regurgitation.  4. Aortic valve regurgitation is not visualized. Mild aortic valve sclerosis is present, with no evidence of aortic valve stenosis. Comparison(s): The left ventricular function is unchanged when compared to echo from 2021. [name] Kitchen FINDINGS  Left Ventricle: Left ventricular ejection fraction, by estimation, is 50 to 55%. The left ventricle has low normal function. The left ventricle has no regional wall motion abnormalities. The left ventricular internal cavity size was mildly dilated. There is mild left ventricular hypertrophy. Left ventricular diastolic parameters are consistent with Grade I diastolic dysfunction (impaired relaxation). Right Ventricle: The right ventricular size is normal. Right vetricular wall thickness was not assessed. Right ventricular systolic function is normal. Left Atrium: Left atrial size was normal in size. Right Atrium: Right atrial size was normal in size. Pericardium: There is no evidence of pericardial effusion. Mitral Valve: The mitral valve is normal in structure. Trivial mitral valve regurgitation. Tricuspid Valve: The tricuspid valve is normal in structure. Tricuspid valve regurgitation is trivial. Aortic Valve: Aortic valve regurgitation is not visualized. Mild aortic valve sclerosis is present, with no evidence of aortic valve stenosis. Pulmonic Valve: The pulmonic valve was not well visualized.  Pulmonic valve regurgitation is not visualized. No evidence of pulmonic stenosis. Aorta: The aortic root is normal in size and structure. IAS/Shunts: No atrial level shunt detected by color flow Doppler.  LEFT VENTRICLE PLAX 2D LVIDd:         5.50 cm  Diastology LVIDs:         3.90 cm  LV e' medial:    3.70 cm/s LV PW:  1.20 cm  LV E/e' medial:  17.2 LV IVS:        1.00 cm  LV e' lateral:   5.77 cm/s LVOT diam:     2.00 cm  LV E/e' lateral: 11.0 LV SV:         57 LV SV Index:   25 LVOT Area:     3.14 cm  RIGHT VENTRICLE RV S prime:     16.20 cm/s TAPSE (M-mode): 2.2 cm LEFT ATRIUM             Index       RIGHT ATRIUM           Index LA diam:        3.80 cm 1.68 cm/m  RA Area:     16.60 cm LA Vol (A2C):   60.2 ml 26.61 ml/m RA Volume:   46.70 ml  20.64 ml/m LA Vol (A4C):   64.8 ml 28.65 ml/m LA Biplane Vol: 68.4 ml 30.24 ml/m  AORTIC VALVE LVOT Vmax:   82.40 cm/s LVOT Vmean:  54.400 cm/s LVOT VTI:    0.183 m  AORTA Ao Root diam: 3.90 cm MITRAL VALVE MV Area (PHT): 2.92 cm    SHUNTS MV Decel Time: 260 msec    Systemic VTI:  0.18 m MV E velocity: 63.70 cm/s  Systemic Diam: 2.00 cm MV A velocity: 94.80 cm/s MV E/A ratio:  0.67 Dorris Carnes MD Electronically signed by Dorris Carnes MD Signature Date/Time: 06/18/2020/3:44:03 PM    Final     Microbiology: Recent Results (from the past 240 hour(s))  SARS CORONAVIRUS 2 (TAT 6-24 HRS) Nasopharyngeal Nasopharyngeal Swab     Status: None   Collection Time: 06/17/20  9:03 PM   Specimen: Nasopharyngeal Swab  Result Value Ref Range Status   SARS Coronavirus 2 NEGATIVE NEGATIVE Final    Comment: (NOTE) SARS-CoV-2 target nucleic acids are NOT DETECTED.  The SARS-CoV-2 RNA is generally detectable in upper and lower respiratory specimens during the acute phase of infection. Negative results do not preclude SARS-CoV-2 infection, do not rule out co-infections with other pathogens, and should not be used as the sole basis for treatment or other patient management  decisions. Negative results must be combined with clinical observations, patient history, and epidemiological information. The expected result is Negative.  Fact Sheet for Patients: SugarRoll.be  Fact Sheet for Healthcare Providers: https://www.woods-mathews.com/  This test is not yet approved or cleared by the [place] FDA and  has been authorized for detection and/or diagnosis of SARS-CoV-2 by FDA under an Emergency Use Authorization (EUA). This EUA will remain  in effect (meaning this test can be used) for the duration of the COVID-19 declaration under Se ction 564(b)(1) of the Act, 21 U.S.C. section 360bbb-3(b)(1), unless the authorization is terminated or revoked sooner.  Performed at [facility], [place] [address], [place], [place] [address]      Labs: Basic Metabolic Panel: Recent Labs  Lab 06/17/20 2006 06/18/20 0518  NA 136 133*  K 4.4 3.9  CL 103 99  CO2 24 24  GLUCOSE 203* 167*  BUN 17 20  CREATININE 0.92 0.79  CALCIUM 9.1 8.4*  MG 1.7  --    Liver Function Tests: Recent Labs  Lab 06/17/20 2006 06/18/20 0518  AST 18 13*  ALT 11 9  ALKPHOS 82 73  BILITOT 0.3 0.2*  PROT 7.4 6.7  ALBUMIN 3.8 3.5   CBC: Recent Labs  Lab 06/17/20 2006 06/18/20 0518 06/19/20 0517  WBC 9.2 6.6 6.4  NEUTROABS 5.2  --   --   HGB 12.2* 11.1* 11.4*  HCT 39.2 35.6* 36.5*  MCV 94.2 95.7 94.6  PLT 370 317 310    BNP (last 3 results) Recent Labs    06/17/20 2006  BNP 29.0    CBG: Recent Labs  Lab 06/18/20 0728 06/18/20 1128 06/18/20 2003 06/19/20 0716 06/19/20 1111  GLUCAP 178* 123* 237* 162* 149*    Signed:  Barton Dubois MD.  [facility] 06/19/2020, 3:19 PM

## 2020-06-19 NOTE — Progress Notes (Signed)
   06/19/20 1017  Assess: MEWS Score  BP 118/86  Pulse Rate (!) 114  Resp 20  Level of Consciousness Alert  SpO2 96 %  O2 Device Room Air  Patient Activity (if Appropriate) In bed  Assess: MEWS Score  MEWS Temp 0  MEWS Systolic 0  MEWS Pulse 2  MEWS RR 0  MEWS LOC 0  MEWS Score 2  MEWS Score Color Yellow  Assess: if the MEWS score is Yellow or Red  Were vital signs taken at a resting state? Yes  Focused Assessment No change from prior assessment  Early Detection of Sepsis Score *See Row Information* Low  MEWS guidelines implemented *See Row Information* No, vital signs rechecked

## 2020-06-19 NOTE — Plan of Care (Signed)

## 2020-06-20 LAB — GLUCOSE, CAPILLARY: Glucose-Capillary: 132 mg/dL — ABNORMAL HIGH (ref 70–99)

## 2020-06-23 DIAGNOSIS — Z79891 Long term (current) use of opiate analgesic: Secondary | ICD-10-CM | POA: Diagnosis not present

## 2020-06-23 DIAGNOSIS — I739 Peripheral vascular disease, unspecified: Secondary | ICD-10-CM | POA: Diagnosis not present

## 2020-06-23 DIAGNOSIS — G894 Chronic pain syndrome: Secondary | ICD-10-CM | POA: Diagnosis not present

## 2020-06-23 DIAGNOSIS — E1142 Type 2 diabetes mellitus with diabetic polyneuropathy: Secondary | ICD-10-CM | POA: Diagnosis not present

## 2020-06-23 DIAGNOSIS — M4722 Other spondylosis with radiculopathy, cervical region: Secondary | ICD-10-CM | POA: Diagnosis not present

## 2020-06-24 ENCOUNTER — Ambulatory Visit: Payer: PPO | Admitting: Vascular Surgery

## 2020-07-04 ENCOUNTER — Other Ambulatory Visit: Payer: Self-pay

## 2020-07-04 ENCOUNTER — Encounter (HOSPITAL_BASED_OUTPATIENT_CLINIC_OR_DEPARTMENT_OTHER): Payer: PPO | Attending: Internal Medicine | Admitting: Internal Medicine

## 2020-07-04 DIAGNOSIS — M069 Rheumatoid arthritis, unspecified: Secondary | ICD-10-CM | POA: Insufficient documentation

## 2020-07-04 DIAGNOSIS — Z86711 Personal history of pulmonary embolism: Secondary | ICD-10-CM | POA: Insufficient documentation

## 2020-07-04 DIAGNOSIS — F17200 Nicotine dependence, unspecified, uncomplicated: Secondary | ICD-10-CM | POA: Insufficient documentation

## 2020-07-04 DIAGNOSIS — E11622 Type 2 diabetes mellitus with other skin ulcer: Secondary | ICD-10-CM | POA: Insufficient documentation

## 2020-07-04 DIAGNOSIS — E1142 Type 2 diabetes mellitus with diabetic polyneuropathy: Secondary | ICD-10-CM | POA: Diagnosis not present

## 2020-07-04 DIAGNOSIS — L97219 Non-pressure chronic ulcer of right calf with unspecified severity: Secondary | ICD-10-CM | POA: Diagnosis not present

## 2020-07-04 DIAGNOSIS — E1151 Type 2 diabetes mellitus with diabetic peripheral angiopathy without gangrene: Secondary | ICD-10-CM | POA: Insufficient documentation

## 2020-07-04 DIAGNOSIS — L97212 Non-pressure chronic ulcer of right calf with fat layer exposed: Secondary | ICD-10-CM | POA: Diagnosis not present

## 2020-07-04 NOTE — Progress Notes (Signed)
AVIER, JECH ([id number]) Visit Report for 07/04/2020 Allergy List Details Patient Name: Date of Service: Wayne Pratt 07/04/2020 2:45 PM Medical Record Number: [id number] Patient Account Number: [id number] Date of Birth/Sex: Treating RN: May 27, 1960 (60 y.o. Male) [name] Primary Care Cori Justus: [name] Other Clinician: Referring Mandell Pangborn: Treating Annesha Delgreco/Extender: Martie Lee, Sabino Donovan Weeks in Treatment: 0 Allergies Active Allergies penicillin vancomycin Ativan Allergy Notes Electronic Signature(s) Signed: 07/04/2020 6:18:32 PM By: [name] RN Entered By: [name] on 07/04/2020 15:17:24 -------------------------------------------------------------------------------- Arrival Information Details Patient Name: Date of Service: Wayne Pratt, Wayne RY W. 07/04/2020 2:45 PM Medical Record Number: [id number] Patient Account Number: [id number] Date of Birth/Sex: Treating RN: December 12, 1960 (60 y.o. Male) [name] Primary Care Tyquarius Paglia: [name] Other Clinician: Referring Lakenzie Mcclafferty: Treating Hayes Czaja/Extender: Christel Mormon in Treatment: 0 Visit Information Patient Arrived: Ambulatory Arrival Time: 15:09 Accompanied By: wife Transfer Assistance: None Patient Identification Verified: Yes Secondary Verification Process Completed: Yes Patient Requires Transmission-Based Precautions: No Patient Has Alerts: Yes Patient Alerts: Patient on Blood Thinner ABI's: R:1.05 03/22 Electronic Signature(s) Signed: 07/04/2020 6:18:32 PM By: [name] RN Entered By: [name] on 07/04/2020 15:42:45 -------------------------------------------------------------------------------- Clinic Level of Care Assessment Details Patient Name: Date of Service: Wayne Pratt, Wayne Pratt. 07/04/2020 2:45 PM Medical Record Number: [id number] Patient Account Number: [id number] Date of Birth/Sex: Treating RN: 10-04-1960 (60 y.o. Male)  Baruch Gouty Primary Care Gladys Gutman: [name] Other Clinician: Referring Dawid Dupriest: Treating Shamia Uppal/Extender: Fredderick Severance Weeks in Treatment: 0 Clinic Level of Care Assessment Items TOOL 1 Quantity Score []  - 0 Use when EandM and Procedure is performed on INITIAL visit ASSESSMENTS - Nursing Assessment / Reassessment X- 1 20 General Physical Exam (combine w/ comprehensive assessment (listed just below) when performed on new pt. evals) X- 1 25 Comprehensive Assessment (HX, ROS, Risk Assessments, Wounds Hx, etc.) ASSESSMENTS - Wound and Skin Assessment / Reassessment []  - 0 Dermatologic / Skin Assessment (not related to wound area) ASSESSMENTS - Ostomy and/or Continence Assessment and Care []  - 0 Incontinence Assessment and Management []  - 0 Ostomy Care Assessment and Management (repouching, etc.) PROCESS - Coordination of Care X - Simple Patient / Family Education for ongoing care 1 15 []  - 0 Complex (extensive) Patient / Family Education for ongoing care X- 1 10 Staff obtains Programmer, systems, Records, T Results / Process Orders est []  - 0 Staff telephones HHA, Nursing Homes / Clarify orders / etc []  - 0 Routine Transfer to another Facility (non-emergent condition) []  - 0 Routine Hospital Admission (non-emergent condition) X- 1 15 New Admissions / Biomedical engineer / Ordering NPWT Apligraf, etc. , []  - 0 Emergency Hospital Admission (emergent condition) PROCESS - Special Needs []  - 0 Pediatric / Minor Patient Management []  - 0 Isolation Patient Management []  - 0 Pratt / Language / Visual special needs []  - 0 Assessment of Community assistance (transportation, D/C planning, etc.) []  - 0 Additional assistance / Altered mentation []  - 0 Support Surface(s) Assessment (bed, cushion, seat, etc.) INTERVENTIONS - Miscellaneous []  - 0 External ear exam []  - 0 Patient Transfer (multiple staff / Civil Service fast streamer / Similar devices) []  - 0 Simple  Staple / Suture removal (25 or less) []  - 0 Complex Staple / Suture removal (26 or more) []  - 0 Hypo/Hyperglycemic Management (do not check if billed separately) []  - 0 Ankle / Brachial Index (ABI) - do not check if billed separately Has the patient been seen at the hospital within the last three years:  Yes Total Score: 85 Level Of Care: New/Established - Level 3 Electronic Signature(s) Signed: 07/04/2020 6:17:32 PM By: Baruch Gouty RN, BSN Signed: 07/04/2020 6:17:32 PM By: Baruch Gouty RN, BSN Entered By: Baruch Gouty on 07/04/2020 16:02:13 -------------------------------------------------------------------------------- Encounter Discharge Information Details Patient Name: Date of Service: Wayne Pratt, Wayne RY W. 07/04/2020 2:45 PM Medical Record Number: [id number] Patient Account Number: [id number] Date of Birth/Sex: Treating RN: 06/12/1960 (60 y.o. Male) Deon Pilling Primary Care Shawntae Lowy: [name] Other Clinician: Referring Sanvi Ehler: Treating Leydi Winstead/Extender: Fredderick Severance Weeks in Treatment: 0 Encounter Discharge Information Items Post Procedure Vitals Discharge Condition: Stable Temperature (F): 98.4 Ambulatory Status: Ambulatory Pulse (bpm): 74 Discharge Destination: Home Respiratory Rate (breaths/min): 17 Transportation: Private Auto Blood Pressure (mmHg): 125/74 Accompanied By: wife Schedule Follow-up Appointment: Yes Clinical Summary of Care: Electronic Signature(s) Signed: 07/04/2020 6:02:43 PM By: Deon Pilling Entered By: Deon Pilling on 07/04/2020 18:00:01 -------------------------------------------------------------------------------- Lower Extremity Assessment Details Patient Name: Date of Service: Wayne Pratt 07/04/2020 2:45 PM Medical Record Number: [id number] Patient Account Number: [id number] Date of Birth/Sex: Treating RN: Aug 10, 1960 (60 y.o. Male) [name] Primary Care Olivette Beckmann: [name] Other  Clinician: Referring Tiki Tucciarone: Treating Renai Lopata/Extender: Fredderick Severance Weeks in Treatment: 0 Edema Assessment Assessed: [Left: No] [Right: Yes] Edema: [Left: Ye] [Right: s] Calf Left: Right: Point of Measurement: 34 cm From Medial Instep 44 cm Ankle Left: Right: Point of Measurement: 9 cm From Medial Instep 28 cm Vascular Assessment Pulses: Dorsalis Pedis Palpable: [Right:Yes] Posterior Tibial Palpable: [Right:Yes] Electronic Signature(s) Signed: 07/04/2020 6:18:32 PM By: [name] RN Signed: 07/04/2020 6:18:32 PM By: [name] RN Entered By: [name] on 07/04/2020 15:37:37 -------------------------------------------------------------------------------- Multi Wound Chart Details Patient Name: Date of Service: Wayne Pratt, Wayne RY W. 07/04/2020 2:45 PM Medical Record Number: [id number] Patient Account Number: [id number] Date of Birth/Sex: Treating RN: 02/20/1960 (60 y.o. Male) Baruch Gouty Primary Care Nadira Single: [name] Other Clinician: Referring Zyla Dascenzo: Treating Hudsyn Champine/Extender: Fredderick Severance Weeks in Treatment: 0 Vital Signs Height(in): 73 Capillary Blood Glucose(mg/dl): 131 Weight(lbs): 215 Pulse(bpm): 29 Body Mass Index(BMI): 28 Blood Pressure(mmHg): 125/74 Temperature(F): 98.4 Respiratory Rate(breaths/min): 17 Photos: [1:No Photos Right, Posterior Lower Leg] [N/A:N/A N/A] Wound Location: [1:Surgical Injury] [N/A:N/A] Wounding Event: [1:Diabetic Wound/Ulcer of the Lower] [N/A:N/A] Primary Etiology: [1:Extremity Anemia, Asthma, Sleep Apnea,] [N/A:N/A] Comorbid History: [1:Hypertension, Peripheral Arterial Disease, Type II Diabetes, Rheumatoid Arthritis, Neuropathy 02/04/2020] [N/A:N/A] Date Acquired: [1:0] [N/A:N/A] Weeks of Treatment: [1:Open] [N/A:N/A] Wound Status: [1:55x3.4x0.1] [N/A:N/A] Measurements L x W x D (cm) [1:146.869] [N/A:N/A] A (cm) : rea [1:14.687] [N/A:N/A] Volume (cm) :  [1:Grade 2] [N/A:N/A] Classification: [1:Medium] [N/A:N/A] Exudate A mount: [1:Serosanguineous] [N/A:N/A] Exudate Type: [1:red, brown] [N/A:N/A] Exudate Color: [1:Distinct, outline attached] [N/A:N/A] Wound Margin: [1:Small (1-33%)] [N/A:N/A] Granulation A mount: [1:Red, Pink] [N/A:N/A] Granulation Quality: [1:Large (67-100%)] [N/A:N/A] Necrotic A mount: [1:Fascia: No] [N/A:N/A] Exposed Structures: [1:Fat Layer (Subcutaneous Tissue): No Tendon: No Muscle: No Joint: No Bone: No None] [N/A:N/A] Epithelialization: [1:Debridement - Excisional] [N/A:N/A] Debridement: Pre-procedure Verification/Time Out 15:55 [N/A:N/A] Taken: [1:Other] [N/A:N/A] Pain Control: [1:Subcutaneous, Slough] [N/A:N/A] Tissue Debrided: [1:Skin/Subcutaneous Tissue] [N/A:N/A] Level: [1:18.7] [N/A:N/A] Debridement A (sq cm): [1:rea Curette] [N/A:N/A] Instrument: [1:Minimum] [N/A:N/A] Bleeding: [1:Pressure] [N/A:N/A] Hemostasis A chieved: [1:8] [N/A:N/A] Procedural Pain: [1:5] [N/A:N/A] Post Procedural Pain: [1:Procedure was tolerated well] [N/A:N/A] Debridement Treatment Response: [1:5.5x3.4x0.1] [N/A:N/A] Post Debridement Measurements L x W x D (cm) [1:1.469] [N/A:N/A] Post Debridement Volume: (cm) [1:Debridement] [N/A:N/A] Treatment Notes Electronic Signature(s) Signed: 07/04/2020 5:20:40 PM By: [name] Signed: 07/04/2020 6:17:32 PM By: Baruch Gouty  RN, BSN Entered By: Linton Ham on 07/04/2020 16:20:45 -------------------------------------------------------------------------------- Multi-Disciplinary Care Plan Details Patient Name: Date of Service: Wayne Pratt 07/04/2020 2:45 PM Medical Record Number: [id number] Patient Account Number: [id number] Date of Birth/Sex: Treating RN: 1960/02/16 (60 y.o. Male) Baruch Gouty Primary Care Mirella Gueye: [name] Other Clinician: Referring Christasia Angeletti: Treating Alin Hutchins/Extender: Christel Mormon in Treatment:  0 Multidisciplinary Care Plan reviewed with physician Active Inactive Nutrition Nursing Diagnoses: Impaired glucose control: actual or potential Potential for alteratiion in Nutrition/Potential for imbalanced nutrition Goals: Patient/caregiver will maintain therapeutic glucose control Date Initiated: 07/04/2020 Target Resolution Date: 08/01/2020 Goal Status: Active Interventions: Assess HgA1c results as ordered upon admission and as needed Provide education on elevated blood sugars and impact on wound healing Provide education on nutrition Treatment Activities: Patient referred to Primary Care Physician for further nutritional evaluation : 07/04/2020 Notes: Tissue Oxygenation Nursing Diagnoses: Actual ineffective tissue perfusion; peripheral (select once diagnosis is confirmed) Knowledge deficit related to disease process and management Goals: Patient/caregiver will verbalize understanding of disease process and disease management Date Initiated: 07/04/2020 Target Resolution Date: 08/01/2020 Goal Status: Active Interventions: Assess patient understanding of disease process and management upon diagnosis and as needed Assess peripheral arterial status upon admission and as needed Treatment Activities: Revascularization procedures : 07/04/2020 Notes: Wound/Skin Impairment Nursing Diagnoses: Impaired tissue integrity Knowledge deficit related to ulceration/compromised skin integrity Goals: Patient/caregiver will verbalize understanding of skin care regimen Date Initiated: 07/04/2020 Target Resolution Date: 08/01/2020 Goal Status: Active Ulcer/skin breakdown will have a volume reduction of 30% by week 4 Date Initiated: 07/04/2020 Target Resolution Date: 08/01/2020 Goal Status: Active Interventions: Assess patient/caregiver ability to obtain necessary supplies Assess patient/caregiver ability to perform ulcer/skin care regimen upon admission and as needed Assess ulceration(s) every  visit Provide education on ulcer and skin care Treatment Activities: Skin care regimen initiated : 07/04/2020 Topical wound management initiated : 07/04/2020 Notes: Electronic Signature(s) Signed: 07/04/2020 6:17:32 PM By: Baruch Gouty RN, BSN Entered By: Baruch Gouty on 07/04/2020 15:59:53 -------------------------------------------------------------------------------- Pain Assessment Details Patient Name: Date of Service: Wayne Pratt, [name] 07/04/2020 2:45 PM Medical Record Number: [id number] Patient Account Number: [id number] Date of Birth/Sex: Treating RN: 09/21/1960 (60 y.o. Male) [name] Primary Care Adeola Dennen: [name] Other Clinician: Referring Alyvia Derk: Treating Fredrik Mogel/Extender: Fredderick Severance Weeks in Treatment: 0 Active Problems Location of Pain Severity and Description of Pain Patient Has Paino Yes Site Locations Pain Location: Generalized Pain, Pain in Ulcers With Dressing Change: Yes Duration of the Pain. Constant / Intermittento Intermittent Rate the pain. Current Pain Level: 9 Worst Pain Level: 10 Least Pain Level: 0 Tolerable Pain Level: 9 Character of Pain Describe the Pain: Aching Pain Management and Medication Current Pain Management: Medication: Yes Cold Application: No Rest: Yes Massage: No Activity: No T.E.N.S.: No Heat Application: No Leg drop or elevation: No Is the Current Pain Management Adequate: Adequate How does your wound impact your activities of daily livingo Sleep: No Bathing: No Appetite: No Relationship With Others: No Bladder Continence: No Emotions: No Bowel Continence: No Work: No Toileting: No Drive: No Dressing: No Hobbies: No Electronic Signature(s) Signed: 07/04/2020 6:18:32 PM By: [name] RN Entered By: [name] on 07/04/2020 15:28:21 -------------------------------------------------------------------------------- Patient/Caregiver Education  Details Patient Name: Date of Service: Wayne Pratt, Wayne Pratt 6/24/2022andnbsp2:45 PM Medical Record Number: [id number] Patient Account Number: [id number] Date of Birth/Gender: Treating RN: 1960/10/23 (60 y.o. Male) Baruch Gouty Primary Care Physician: [name] Other Clinician: Referring Physician: Treating Physician/Extender: Fredderick Severance  Weeks in Treatment: 0 Education Assessment Education Provided To: Patient Education Topics Provided Elevated Blood Sugar/ Impact on Healing: Handouts: Elevated Blood Sugars: How Do They Affect Wound Healing Methods: Explain/Verbal, Printed Responses: Reinforcements needed, State content correctly [place]: o Handouts: Welcome T [facility] [place] o Methods: Explain/Verbal, Printed Responses: Reinforcements needed, State content correctly Wound Debridement: Handouts: Wound Debridement Methods: Explain/Verbal, Printed Responses: Reinforcements needed, State content correctly Wound/Skin Impairment: Handouts: Caring for Your Ulcer, Skin Care Do's and Dont's Methods: Explain/Verbal, Pharmacist, hospital) Signed: 07/04/2020 6:17:32 PM By: Baruch Gouty RN, BSN Entered By: Baruch Gouty on 07/04/2020 16:01:38 -------------------------------------------------------------------------------- Wound Assessment Details Patient Name: Date of Service: Wayne Pratt, [name] 07/04/2020 2:45 PM Medical Record Number: [id number] Patient Account Number: [id number] Date of Birth/Sex: Treating RN: 1960-03-19 (60 y.o. Male) [name] Primary Care Marcquis Ridlon: [name] Other Clinician: Referring Carlos Quackenbush: Treating Norah Devin/Extender: Fredderick Severance Weeks in Treatment: 0 Wound Status Wound Number: 1 Primary Diabetic Wound/Ulcer of the Lower Extremity Etiology: Wound Location: Right, Posterior Lower Leg Wound Open Wounding Event: Surgical Injury Status: Date Acquired:  02/04/2020 Comorbid Anemia, Asthma, Sleep Apnea, Hypertension, Peripheral Arterial Weeks Of Treatment: 0 History: Disease, Type II Diabetes, Rheumatoid Arthritis, Neuropathy Clustered Wound: No Photos Wound Measurements Length: (cm) 55 Width: (cm) 3.4 Depth: (cm) 0.1 Area: (cm) 146.869 Volume: (cm) 14.687 % Reduction in Area: 0% % Reduction in Volume: 0% Epithelialization: None Tunneling: No Undermining: No Wound Description Classification: Grade 2 Wound Margin: Distinct, outline attached Exudate Amount: Medium Exudate Type: Serosanguineous Exudate Color: red, brown Foul Odor After Cleansing: No Slough/Fibrino Yes Wound Bed Granulation Amount: Small (1-33%) Exposed Structure Granulation Quality: Red, Pink Fascia Exposed: No Necrotic Amount: Large (67-100%) Fat Layer (Subcutaneous Tissue) Exposed: No Necrotic Quality: Adherent Slough Tendon Exposed: No Muscle Exposed: No Joint Exposed: No Bone Exposed: No Treatment Notes Wound #1 (Lower Leg) Wound Laterality: Right, Posterior Cleanser Peri-Wound Care Topical Primary Dressing Santyl Ointment Discharge Instruction: Apply nickel thick amount to wound bed as instructed Secondary Dressing Woven Gauze Sponge, Non-Sterile 4x4 in Discharge Instruction: Apply over primary dressing as directed. ABD Pad, 5x9 Discharge Instruction: Apply over primary dressing as directed. Secured With [employer] 4x5 (in/yd) Discharge Instruction: Secure with Coban not too tight Kerlix Roll Sterile, 4.5x3.1 (in/yd) Discharge Instruction: Secure with Kerlix as directed. Paper Tape, 2x10 (in/yd) Discharge Instruction: Secure dressing with tape as directed. Compression Wrap Compression Stockings Add-Ons Electronic Signature(s) Signed: 07/04/2020 5:07:58 PM By: Sandre Kitty Signed: 07/04/2020 6:18:32 PM By: [name] RN Entered By: Sandre Kitty on 07/04/2020  17:05:53 -------------------------------------------------------------------------------- Vitals Details Patient Name: Date of Service: Wayne Pratt, Wayne RY W. 07/04/2020 2:45 PM Medical Record Number: [id number] Patient Account Number: [id number] Date of Birth/Sex: Treating RN: 1960-09-22 (60 y.o. Male) [name] Primary Care Virgia Kelner: [name] Other Clinician: Referring Patrick Salemi: Treating Mckale Haffey/Extender: Fredderick Severance Weeks in Treatment: 0 Vital Signs Time Taken: 15:09 Temperature (F): 98.4 Height (in): 73 Pulse (bpm): 74 Source: Stated Respiratory Rate (breaths/min): 17 Weight (lbs): 215 Blood Pressure (mmHg): 125/74 Source: Stated Capillary Blood Glucose (mg/dl): 131 Body Mass Index (BMI): 28.4 Reference Range: 80 - 120 mg / dl Electronic Signature(s) Signed: 07/04/2020 6:18:32 PM By: [name] RN Entered By: [name] on 07/04/2020 15:16:20

## 2020-07-04 NOTE — Progress Notes (Signed)
Wayne, [name] ([id number]) Visit Report for 07/04/2020 Abuse/Suicide Risk Screen Details Patient Name: Date of Service: Wayne Pratt 07/04/2020 2:45 PM Medical Record Number: [id number] Patient Account Number: [id number] Date of Birth/Sex: Treating RN: 31-May-1960 (60 y.o. Male) [name] Primary Care Soni Kegel: [name] Other Clinician: Referring Arvell Pulsifer: Treating Aftin Lye/Extender: Fredderick Severance Weeks in Treatment: 0 Abuse/Suicide Risk Screen Items Answer ABUSE RISK SCREEN: Has anyone close to you tried to hurt or harm you recentlyo No Do you feel uncomfortable with anyone in your familyo No Has anyone forced you do things that you didnt want to doo No Electronic Signature(s) Signed: 07/04/2020 6:18:32 PM By: [name] RN Entered By: [name] on 07/04/2020 15:26:55 -------------------------------------------------------------------------------- Activities of Daily Living Details Patient Name: Date of Service: Wayne Pratt. 07/04/2020 2:45 PM Medical Record Number: [id number] Patient Account Number: [id number] Date of Birth/Sex: Treating RN: 1960/08/05 (60 y.o. Male) [name] Primary Care Chistian Kasler: [name] Other Clinician: Referring Reyne Falconi: Treating Brannen Koppen/Extender: Fredderick Severance Weeks in Treatment: 0 Activities of Daily Living Items Answer Activities of Daily Living (Please select one for each item) Drive Automobile Completely Able T Medications ake Completely Able Use T elephone Completely Able Care for Appearance Completely Able Use T oilet Completely Able Bath / Shower Completely Able Dress Self Completely Able Feed Self Completely Able Walk Completely Able Get In / Out Bed Completely Able Housework Completely Able Prepare Meals Completely [place] Completely Able Shop for Self Completely Able Electronic Signature(s) Signed: 07/04/2020 6:18:32 PM By: [name] RN Entered By: [name] on 07/04/2020 15:27:14 -------------------------------------------------------------------------------- Education Screening Details Patient Name: Date of Service: Wayne Pratt, Wayne Pray W. 07/04/2020 2:45 PM Medical Record Number: [id number] Patient Account Number: [id number] Date of Birth/Sex: Treating RN: Jul 20, 1960 (60 y.o. Male) [name] Primary Care Yashica Sterbenz: [name] Other Clinician: Referring Lashana Spang: Treating Lesieli Bresee/Extender: Christel Mormon in Treatment: 0 Primary Learner Assessed: Patient Learning Preferences/Education Level/Primary Language Learning Preference: Explanation, Demonstration, Communication Board, Printed Material Highest Education Level: College or Above Preferred Language: English Cognitive Barrier Language Barrier: No Translator Needed: No Memory Deficit: No Emotional Barrier: No Cultural/Religious Beliefs Affecting Medical Care: No Physical Barrier Impaired Vision: No Impaired Hearing: No Decreased Hand dexterity: No Knowledge/Comprehension Knowledge Level: High Comprehension Level: High Ability to understand written instructions: High Ability to understand verbal instructions: High Motivation Anxiety Level: Calm Cooperation: Cooperative Education Importance: Denies Need Interest in Health Problems: Asks Questions Perception: Coherent Willingness to Engage in Self-Management High Activities: Readiness to Engage in Self-Management High Activities: Electronic Signature(s) Signed: 07/04/2020 6:18:32 PM By: [name] RN Entered By: [name] on 07/04/2020 15:27:35 -------------------------------------------------------------------------------- Fall Risk Assessment Details Patient Name: Date of Service: Wayne Pratt, [name] 07/04/2020 2:45 PM Medical Record Number: [id number] Patient Account Number: [id number] Date of Birth/Sex: Treating RN: [birth year] (60 y.o.  Male) [name] Primary Care Wayne Pratt: [name] Other Clinician: Referring Ardie Mclennan: Treating Perlie Stene/Extender: Fredderick Severance Weeks in Treatment: 0 Fall Risk Assessment Items Have you had 2 or more falls in the last 12 monthso 0 No Have you had any fall that resulted in injury in the last 12 monthso 0 No FALLS RISK SCREEN History of falling - immediate or within 3 months 0 No Secondary diagnosis (Do you have 2 or more medical diagnoseso) 0 No Ambulatory aid None/bed rest/wheelchair/nurse 0 No Crutches/cane/walker 0 No Furniture 0 No Intravenous therapy Access/Saline/Heparin Lock 0 No Gait/Transferring Normal/ bed rest/ wheelchair 0 No  Weak (short steps with or without shuffle, stooped but able to lift head while walking, may seek 0 No support from furniture) Impaired (short steps with shuffle, may have difficulty arising from chair, head down, impaired 0 No balance) Mental Status Oriented to own ability 0 No Electronic Signature(s) Signed: 07/04/2020 6:18:32 PM By: [name] RN Entered By: [name] on 07/04/2020 15:27:41 -------------------------------------------------------------------------------- Foot Assessment Details Patient Name: Date of Service: Wayne Pratt, Wayne Pray W. 07/04/2020 2:45 PM Medical Record Number: [id number] Patient Account Number: [id number] Date of Birth/Sex: Treating RN: 02/15/1960 (60 y.o. Male) [name] Primary Care Jakyren Fluegge: [name] Other Clinician: Referring Bentleigh Stankus: Treating Narcissus Detwiler/Extender: Fredderick Severance Weeks in Treatment: 0 Foot Assessment Items Site Locations + = Sensation present, - = Sensation absent, C = Callus, U = Ulcer R = Redness, W = Warmth, M = Maceration, PU = Pre-ulcerative lesion F = Fissure, S = Swelling, D = Dryness Assessment Right: Left: Other Deformity: No No Prior Foot Ulcer: No No Prior Amputation: No No Charcot Joint: No  No Ambulatory Status: Ambulatory Without Help Gait: Steady Electronic Signature(s) Signed: 07/04/2020 6:18:32 PM By: [name] RN Entered By: [name] on 07/04/2020 15:38:39 -------------------------------------------------------------------------------- Nutrition Risk Screening Details Patient Name: Date of Service: Wayne Pratt, Wayne Pray W. 07/04/2020 2:45 PM Medical Record Number: [id number] Patient Account Number: [id number] Date of Birth/Sex: Treating RN: [birth year] (60 y.o. Male) [name] Primary Care Padme Arriaga: [name] Other Clinician: Referring Kiyomi Pallo: Treating Charlis Harner/Extender: Martie Lee, Sabino Donovan Weeks in Treatment: 0 Height (in): 73 Weight (lbs): 215 Body Mass Index (BMI): 28.4 Nutrition Risk Screening Items Score Screening NUTRITION RISK SCREEN: I have an illness or condition that made me change the kind and/or amount of food I eat 0 No I eat fewer than two meals per day 0 No I eat few fruits and vegetables, or milk products 0 No I have three or more drinks of beer, liquor or wine almost every day 0 No I have tooth or mouth problems that make it hard for me to eat 0 No I don't always have enough money to buy the food I need 0 No I eat alone most of the time 0 No I take three or more different prescribed or over-the-counter drugs a day 0 No Without wanting to, I have lost or gained 10 pounds in the last six months 0 No I am not always physically able to shop, cook and/or feed myself 0 No Nutrition Protocols Good Risk Protocol 0 No interventions needed Moderate Risk Protocol High Risk Proctocol Risk Level: Good Risk Score: 0 Electronic Signature(s) Signed: 07/04/2020 6:18:32 PM By: [name] RN Entered By: [name] on 07/04/2020 15:27:48

## 2020-07-07 DIAGNOSIS — S81801A Unspecified open wound, right lower leg, initial encounter: Secondary | ICD-10-CM | POA: Diagnosis not present

## 2020-07-08 NOTE — Progress Notes (Signed)
ARKIN, IMRAN ([id number]) Visit Report for 07/04/2020 Chief Complaint Document Details Patient Name: Date of Service: Wayne Pratt 07/04/2020 2:45 PM Medical Record Number: [id number] Patient Account Number: [id number] Date of Birth/Sex: Treating RN: [birth year] (60 y.o. Male) Baruch Gouty Primary Care Provider: [name] Other Clinician: Referring Provider: Treating Provider/Extender: Fredderick Severance Weeks in Treatment: 0 Information Obtained from: Patient Chief Complaint 07/04/2021; patient arrives for review of wound on the right posterior calf Electronic Signature(s) Signed: 07/04/2020 5:20:40 PM By: [name] Entered By: Linton Ham on 07/04/2020 16:21:16 -------------------------------------------------------------------------------- Debridement Details Patient Name: Date of Service: Wayne Pratt, Wayne Pray W. 07/04/2020 2:45 PM Medical Record Number: [id number] Patient Account Number: [id number] Date of Birth/Sex: Treating RN: 04-10-1960 (60 y.o. Male) Baruch Gouty Primary Care Provider: [name] Other Clinician: Referring Provider: Treating Provider/Extender: Fredderick Severance Weeks in Treatment: 0 Debridement Performed for Assessment: Wound #1 Right,Posterior Lower Leg Performed By: Physician Ricard Dillon., MD Debridement Type: Debridement Severity of Tissue Pre Debridement: Fat layer exposed Level of Consciousness (Pre-procedure): Awake and Alert Pre-procedure Verification/Time Out Yes - 15:55 Taken: Start Time: 15:58 Pain Control: Other : benzocaine 20% spray T Area Debrided (L x W): otal 5.5 (cm) x 3.4 (cm) = 18.7 (cm) Tissue and other material debrided: Viable, Non-Viable, Slough, Subcutaneous, Slough Level: Skin/Subcutaneous Tissue Debridement Description: Excisional Instrument: Curette Bleeding: Minimum Hemostasis Achieved: Pressure End Time: 16:03 Procedural Pain: 8 Post Procedural Pain: 5 Response  to Treatment: Procedure was tolerated well Level of Consciousness (Post- Awake and Alert procedure): Post Debridement Measurements of Total Wound Length: (cm) 5.5 Width: (cm) 3.4 Depth: (cm) 0.1 Volume: (cm) 1.469 Character of Wound/Ulcer Post Debridement: Improved Severity of Tissue Post Debridement: Fat layer exposed Post Procedure Diagnosis Same as Pre-procedure Electronic Signature(s) Signed: 07/04/2020 5:20:40 PM By: [name] Signed: 07/04/2020 6:17:32 PM By: Baruch Gouty RN, BSN Entered By: Linton Ham on 07/04/2020 16:20:57 -------------------------------------------------------------------------------- HPI Details Patient Name: Date of Service: Wayne Pratt, Wayne RY W. 07/04/2020 2:45 PM Medical Record Number: [id number] Patient Account Number: [id number] Date of Birth/Sex: Treating RN: 08/02/1960 (60 y.o. Male) Baruch Gouty Primary Care Provider: [name] Other Clinician: Referring Provider: Treating Provider/Extender: Fredderick Severance Weeks in Treatment: 0 History of Present Illness HPI Description: ADMISSION 07/04/2020 This is a 60 year old woman who presented to Dr. Carlis Abbott in January of this year having critical limb ischemia. First angiogram was on 01/31/2020 showed proximal profound disease in the SFA with right foot runoff via diseased posterior tibia. He underwent a right femoral endarterectomy with a right common femoral to below-knee popliteal bypass on 02/04/2020. There were generally good vascular results however the day after the surgery according to his wife he developed a black eschar at presumably ischemic spot on the right posterior calf. He also ultimately had amputations on 02/29/2020 of the right first second and fifth toes in March she had a wound VAC on the right calf but this was too painful and was switched to wet-to-dry. At 1 point this clearly had exposed muscle and the wife was able to document this by showing me short  movie on his phone. He had an operative debridement on the right calf in early April with a skin substitute oMyriad. In any case none of this really made any difference. He was hospitalized early in June with bilateral pulmonary emboli and is now on Eliquis. Long medical problem list but predominantly type 2 diabetes with peripheral neuropathy, spinal stenosis. He is a smoker  Most recent arterial studies were done on 03/18/2020 on the right this showed a an ABI in the posterior tibial of 1.05 with biphasic waveforms on the left noncompressible at 1.38 but with a great toe pressure near normal at 1.11 again with triphasic waveforms. Electronic Signature(s) Signed: 07/04/2020 5:20:40 PM By: [name] Entered By: Linton Ham on 07/04/2020 16:38:32 -------------------------------------------------------------------------------- Physical Exam Details Patient Name: Date of Service: Wayne Pratt, Wayne Pray W. 07/04/2020 2:45 PM Medical Record Number: [id number] Patient Account Number: [id number] Date of Birth/Sex: Treating RN: 1960-05-13 (60 y.o. Male) Baruch Gouty Primary Care Provider: [name] Other Clinician: Referring Provider: Treating Provider/Extender: Fredderick Severance Weeks in Treatment: 0 Constitutional Sitting or standing Blood Pressure is within target range for patient.. Pulse regular and within target range for patient.[name] Kitchen Respirations regular, non-labored and within target range.. Temperature is normal and within the target range for the patient.[name] Kitchen Appears in no distress. Cardiovascular Pedal pulses are palpable but reduced he does have a popliteal pulse.. Edema present in the right leg but not really excessive. No evidence of the DVT. Notes Wound exam; significant wound on the right posterior calf. His wife is able to show me about a wound that had exposed muscle at one point. Comes in with thick adherent gelatinous slough. I used a #5 curette to extensively  debride this down to in the subcutaneous tissue. Not completely healthy looking wound surface but generally quite a bit better. Thankfully there was no open wound to expose muscle. No evidence of surrounding infection. There was a fair amount of bleeding which was nice to see. Electronic Signature(s) Signed: 07/04/2020 5:20:40 PM By: [name] Entered By: Linton Ham on 07/04/2020 16:37:43 -------------------------------------------------------------------------------- Physician Orders Details Patient Name: Date of Service: Wayne Pratt, Wayne RY W. 07/04/2020 2:45 PM Medical Record Number: [id number] Patient Account Number: [id number] Date of Birth/Sex: Treating RN: 04/25/1960 (60 y.o. Male) Baruch Gouty Primary Care Provider: [name] Other Clinician: Referring Provider: Treating Provider/Extender: Fredderick Severance Weeks in Treatment: 0 Verbal / Phone Orders: No Diagnosis Coding Follow-up Appointments ppointment in 1 week. - with Dr. Dellia Nims Return A Bathing/ Shower/ Hygiene May shower and wash wound with soap and water. Edema Control - Lymphedema / SCD / Other Elevate legs to the level of the heart or above for 30 minutes daily and/or when sitting, a frequency of: - 3-4 times per day Avoid standing for long periods of time. Exercise regularly Wound Treatment Wound #1 - Lower Leg Wound Laterality: Right, Posterior Prim Dressing: Santyl Ointment 1 x Per Day/30 Days ary Discharge Instructions: Apply nickel thick amount to wound bed as instructed Secondary Dressing: Woven Gauze Sponge, Non-Sterile 4x4 in (DME) (Generic) 1 x Per Day/30 Days Discharge Instructions: Apply over primary dressing as directed. Secondary Dressing: ABD Pad, 5x9 (DME) (Generic) 1 x Per Day/30 Days Discharge Instructions: Apply over primary dressing as directed. Secured With: Coban Self-Adherent Wrap 4x5 (in/yd) (DME) (Generic) 1 x Per Day/30 Days Discharge Instructions: Secure  with Coban not too tight Secured With: [employer], 4.5x3.1 (in/yd) (DME) (Generic) 1 x Per Day/30 Days Discharge Instructions: Secure with Kerlix as directed. Secured With: Paper Tape, 2x10 (in/yd) (DME) (Generic) 1 x Per Day/30 Days Discharge Instructions: Secure dressing with tape as directed. Patient Medications llergies: penicillin, vancomycin, Ativan A Notifications Medication Indication Start End prior to debridement 07/04/2020 benzocaine DOSE topical 20 % aerosol - aerosol topical 07/04/2020 Santyl DOSE topical 250 unit/gram ointment - ointment topical to wound right calf change  daily Electronic Signature(s) Signed: 07/04/2020 4:39:59 PM By: [name] Entered By: Linton Ham on 07/04/2020 16:39:58 -------------------------------------------------------------------------------- Problem List Details Patient Name: Date of Service: Wayne Pratt, Wayne RY W. 07/04/2020 2:45 PM Medical Record Number: [id number] Patient Account Number: [id number] Date of Birth/Sex: Treating RN: 1960-02-20 (60 y.o. Male) Baruch Gouty Primary Care Provider: [name] Other Clinician: Referring Provider: Treating Provider/Extender: Fredderick Severance Weeks in Treatment: 0 Active Problems ICD-10 Encounter Code Description Active Date MDM Diagnosis E11.51 Type 2 diabetes mellitus with diabetic peripheral angiopathy without gangrene 07/04/2020 No Yes L97.218 Non-pressure chronic ulcer of right calf with other specified severity 07/04/2020 No Yes Inactive Problems Resolved Problems Electronic Signature(s) Signed: 07/04/2020 5:20:40 PM By: [name] Entered By: Linton Ham on 07/04/2020 16:20:38 -------------------------------------------------------------------------------- Progress Note Details Patient Name: Date of Service: Wayne Pratt, Wayne Pray W. 07/04/2020 2:45 PM Medical Record Number: [id number] Patient Account Number: [id number] Date of Birth/Sex: Treating  RN: 12-12-1960 (60 y.o. Male) Baruch Gouty Primary Care Provider: [name] Other Clinician: Referring Provider: Treating Provider/Extender: Fredderick Severance Weeks in Treatment: 0 Subjective Chief Complaint Information obtained from Patient 07/04/2021; patient arrives for review of wound on the right posterior calf History of Present Illness (HPI) ADMISSION 07/04/2020 This is a 60 year old woman who presented to Dr. Carlis Abbott in January of this year having critical limb ischemia. First angiogram was on 01/31/2020 showed proximal profound disease in the SFA with right foot runoff via diseased posterior tibia. He underwent a right femoral endarterectomy with a right common femoral to below-knee popliteal bypass on 02/04/2020. There were generally good vascular results however the day after the surgery according to his wife he developed a black eschar at presumably ischemic spot on the right posterior calf. He also ultimately had amputations on 02/29/2020 of the right first second and fifth toes in March she had a wound VAC on the right calf but this was too painful and was switched to wet-to-dry. At 1 point this clearly had exposed muscle and the wife was able to document this by showing me short movie on his phone. He had an operative debridement on the right calf in early April with a skin substitute oMyriad. In any case none of this really made any difference. He was hospitalized early in June with bilateral pulmonary emboli and is now on Eliquis. Long medical problem list but predominantly type 2 diabetes with peripheral neuropathy, spinal stenosis. He is a smoker Most recent arterial studies were done on 03/18/2020 on the right this showed a an ABI in the posterior tibial of 1.05 with biphasic waveforms on the left noncompressible at 1.38 but with a great toe pressure near normal at 1.11 again with triphasic waveforms. Patient History Information obtained from  Patient. Allergies penicillin, vancomycin, Ativan Family History Unknown History. Social History Current some day smoker, Marital Status - Married, Alcohol Use - Never, Drug Use - No History, Caffeine Use - Rarely. Medical History Eyes Denies history of Cataracts, Glaucoma, Optic Neuritis Ear/Nose/Mouth/Throat Denies history of Chronic sinus problems/congestion, Middle ear problems Hematologic/Lymphatic Patient has history of Anemia Denies history of Hemophilia, Human Immunodeficiency Virus, Lymphedema, Sickle Cell Disease Respiratory Patient has history of Asthma, Sleep Apnea Denies history of Aspiration, Chronic Obstructive Pulmonary Disease (COPD), Pneumothorax, Tuberculosis Cardiovascular Patient has history of Hypertension, Peripheral Arterial Disease Denies history of Angina, Arrhythmia, Congestive Heart Failure, Coronary Artery Disease, Deep Vein Thrombosis, Hypotension, Myocardial Infarction, Peripheral Venous Disease, Phlebitis, Vasculitis Gastrointestinal Denies history of Cirrhosis , Colitis, Crohnoos, Hepatitis A,  Hepatitis B, Hepatitis C Endocrine Patient has history of Type II Diabetes Denies history of Type I Diabetes Genitourinary Denies history of End Stage Renal Disease Immunological Denies history of Lupus Erythematosus, Raynaudoos, Scleroderma Integumentary (Skin) Denies history of History of Burn Musculoskeletal Patient has history of Rheumatoid Arthritis Denies history of Gout, Osteoarthritis, Osteomyelitis Neurologic Patient has history of Neuropathy Denies history of Dementia, Quadriplegia, Paraplegia, Seizure Disorder Oncologic Denies history of Received Chemotherapy, Received Radiation Psychiatric Denies history of Anorexia/bulimia, Confinement Anxiety Hospitalization/Surgery History - Right toe amp. 1,2,5. - endarterectomy femoral. - cervical disk. - elbow surgery. - femoral popliteal bypass graft. - Lung surgery for empyemea. - patch  angioplasty. - spinal cord stimulator. - vein harvest. Medical A Surgical History Notes nd Respiratory copd Oncologic hx skin cancer Review of Systems (ROS) Constitutional Symptoms (General Health) Denies complaints or symptoms of Fatigue, Fever, Chills, Marked Weight Change. Eyes Denies complaints or symptoms of Dry Eyes, Vision Changes, Glasses / Contacts. Ear/Nose/Mouth/Throat Denies complaints or symptoms of Chronic sinus problems or rhinitis. Respiratory Denies complaints or symptoms of Chronic or frequent coughs, Shortness of Breath. Cardiovascular Denies complaints or symptoms of Chest pain. Gastrointestinal Denies complaints or symptoms of Frequent diarrhea, Nausea, Vomiting. Endocrine Denies complaints or symptoms of Heat/cold intolerance. Genitourinary Denies complaints or symptoms of Frequent urination. Musculoskeletal Denies complaints or symptoms of Muscle Pain, Muscle Weakness. Neurologic Denies complaints or symptoms of Numbness/parasthesias. Psychiatric Denies complaints or symptoms of Claustrophobia, Suicidal. Objective Constitutional Sitting or standing Blood Pressure is within target range for patient.. Pulse regular and within target range for patient.[name] Kitchen Respirations regular, non-labored and within target range.. Temperature is normal and within the target range for the patient.[name] Kitchen Appears in no distress. Vitals Time Taken: 3:09 PM, Height: 73 in, Source: Stated, Weight: 215 lbs, Source: Stated, BMI: 28.4, Temperature: 98.4 F, Pulse: 74 bpm, Respiratory Rate: 17 breaths/min, Blood Pressure: 125/74 mmHg, Capillary Blood Glucose: 131 mg/dl. Cardiovascular Pedal pulses are palpable but reduced he does have a popliteal pulse.. Edema present in the right leg but not really excessive. No evidence of the DVT. General Notes: Wound exam; significant wound on the right posterior calf. His wife is able to show me about a wound that had exposed muscle at one point. Comes  in with thick adherent gelatinous slough. I used a #5 curette to extensively debride this down to in the subcutaneous tissue. Not completely healthy looking wound surface but generally quite a bit better. Thankfully there was no open wound to expose muscle. No evidence of surrounding infection. There was a fair amount of bleeding which was nice to see. Integumentary (Hair, Skin) Wound #1 status is Open. Original cause of wound was Surgical Injury. The date acquired was: 02/04/2020. The wound is located on the Right,Posterior Lower Leg. The wound measures 55cm length x 3.4cm width x 0.1cm depth; 146.869cm^2 area and 14.687cm^3 volume. There is no tunneling or undermining noted. There is a medium amount of serosanguineous drainage noted. The wound margin is distinct with the outline attached to the wound base. There is small (1-33%) red, pink granulation within the wound bed. There is a large (67-100%) amount of necrotic tissue within the wound bed including Adherent Slough. Assessment Active Problems ICD-10 Type 2 diabetes mellitus with diabetic peripheral angiopathy without gangrene Non-pressure chronic ulcer of right calf with other specified severity Procedures Wound #1 Pre-procedure diagnosis of Wound #1 is a Diabetic Wound/Ulcer of the Lower Extremity located on the Right,Posterior Lower Leg .Severity of Tissue Pre Debridement is: Fat layer exposed.  There was a Excisional Skin/Subcutaneous Tissue Debridement with a total area of 18.7 sq cm performed by Ricard Dillon., MD. With the following instrument(s): Curette to remove Viable and Non-Viable tissue/material. Material removed includes Subcutaneous Tissue and Slough and after achieving pain control using Other (benzocaine 20% spray). No specimens were taken. A time out was conducted at 15:55, prior to the start of the procedure. A Minimum amount of bleeding was controlled with Pressure. The procedure was tolerated well with a pain level of  8 throughout and a pain level of 5 following the procedure. Post Debridement Measurements: 5.5cm length x 3.4cm width x 0.1cm depth; 1.469cm^3 volume. Character of Wound/Ulcer Post Debridement is improved. Severity of Tissue Post Debridement is: Fat layer exposed. Post procedure Diagnosis Wound #1: Same as Pre-Procedure Plan Follow-up Appointments: Return Appointment in 1 week. - with Dr. Dellia Nims Bathing/ Shower/ Hygiene: May shower and wash wound with soap and water. Edema Control - Lymphedema / SCD / Other: Elevate legs to the level of the heart or above for 30 minutes daily and/or when sitting, a frequency of: - 3-4 times per day Avoid standing for long periods of time. Exercise regularly The following medication(s) was prescribed: benzocaine topical 20 % aerosol aerosol topical for prior to debridement was prescribed at facility Santyl topical 250 unit/gram ointment ointment topical to wound right calf change daily starting 07/04/2020 WOUND #1: - Lower Leg Wound Laterality: Right, Posterior Prim Dressing: Santyl Ointment 1 x Per Day/30 Days ary Discharge Instructions: Apply nickel thick amount to wound bed as instructed Secondary Dressing: Woven Gauze Sponge, Non-Sterile 4x4 in (DME) (Generic) 1 x Per Day/30 Days Discharge Instructions: Apply over primary dressing as directed. Secondary Dressing: ABD Pad, 5x9 (DME) (Generic) 1 x Per Day/30 Days Discharge Instructions: Apply over primary dressing as directed. Secured With: Coban Self-Adherent Wrap 4x5 (in/yd) (DME) (Generic) 1 x Per Day/30 Days Discharge Instructions: Secure with Coban not too tight Secured With: [employer], 4.5x3.1 (in/yd) (DME) (Generic) 1 x Per Day/30 Days Discharge Instructions: Secure with Kerlix as directed. Secured With: Paper Tape, 2x10 (in/yd) (DME) (Generic) 1 x Per Day/30 Days Discharge Instructions: Secure dressing with tape as directed. 1. Reasonably extensive debridement of a completely nonviable  surface. He had surrounding epithelialization to the level of the debris on the surface. This will clearly need to come off before any healing is going to happen here. 2. Towards that end I prescribed Santyl and they are going to change this daily. 3. Fortunately no evidence of infection 4. His pedal pulses were palpable but reduced. 5. I counseled him to stop cigarette smoking and they promised to work on this. 6. He did have some swelling in the leg but I did not feel the need to put him in compression. 7 is also complaining of a lot of pain. Some of this seems more distal to the wound in the lower leg and the Achilles area and heel. I did not see an obvious explanation for this this could be is peripheral neuropathy. I am doubtful it is ongoing ischemia but that is also possible 8. Follow-up in 1 week I spent 45 minutes in review of this patient's past medical history, face-to-face evaluation and preparation of this record. Santyl was E Agricultural consultant) Signed: 07/04/2020 5:20:40 PM By: [name] Entered By: Linton Ham on 07/04/2020 16:42:13 -------------------------------------------------------------------------------- HxROS Details Patient Name: Date of Service: Wayne Pratt, Wayne RY W. 07/04/2020 2:45 PM Medical Record Number: [id number] Patient Account Number: [id number]  Date of Birth/Sex: Treating RN: Oct 02, 1960 (60 y.o. Male) Wayne Pratt Primary Care Provider: [name] Other Clinician: Referring Provider: Treating Provider/Extender: Fredderick Severance Weeks in Treatment: 0 Information Obtained From Patient Constitutional Symptoms (General Health) Complaints and Symptoms: Negative for: Fatigue; Fever; Chills; Marked Weight Change Eyes Complaints and Symptoms: Negative for: Dry Eyes; Vision Changes; Glasses / Contacts Medical History: Negative for: Cataracts; Glaucoma; Optic Neuritis Ear/Nose/Mouth/Throat Complaints and  Symptoms: Negative for: Chronic sinus problems or rhinitis Medical History: Negative for: Chronic sinus problems/congestion; Middle ear problems Respiratory Complaints and Symptoms: Negative for: Chronic or frequent coughs; Shortness of Breath Medical History: Positive for: Asthma; Sleep Apnea Negative for: Aspiration; Chronic Obstructive Pulmonary Disease (COPD); Pneumothorax; Tuberculosis Past Medical History Notes: copd Cardiovascular Complaints and Symptoms: Negative for: Chest pain Medical History: Positive for: Hypertension; Peripheral Arterial Disease Negative for: Angina; Arrhythmia; Congestive Heart Failure; Coronary Artery Disease; Deep Vein Thrombosis; Hypotension; Myocardial Infarction; Peripheral Venous Disease; Phlebitis; Vasculitis Gastrointestinal Complaints and Symptoms: Negative for: Frequent diarrhea; Nausea; Vomiting Medical History: Negative for: Cirrhosis ; Colitis; Crohns; Hepatitis A; Hepatitis B; Hepatitis C Endocrine Complaints and Symptoms: Negative for: Heat/cold intolerance Medical History: Positive for: Type II Diabetes Negative for: Type I Diabetes Genitourinary Complaints and Symptoms: Negative for: Frequent urination Medical History: Negative for: End Stage Renal Disease Musculoskeletal Complaints and Symptoms: Negative for: Muscle Pain; Muscle Weakness Medical History: Positive for: Rheumatoid Arthritis Negative for: Gout; Osteoarthritis; Osteomyelitis Neurologic Complaints and Symptoms: Negative for: Numbness/parasthesias Medical History: Positive for: Neuropathy Negative for: Dementia; Quadriplegia; Paraplegia; Seizure Disorder Psychiatric Complaints and Symptoms: Negative for: Claustrophobia; Suicidal Medical History: Negative for: Anorexia/bulimia; Confinement Anxiety Hematologic/Lymphatic Medical History: Positive for: Anemia Negative for: Hemophilia; Human Immunodeficiency Virus; Lymphedema; Sickle Cell  Disease Immunological Medical History: Negative for: Lupus Erythematosus; Raynauds; Scleroderma Integumentary (Skin) Medical History: Negative for: History of Burn Oncologic Medical History: Negative for: Received Chemotherapy; Received Radiation Past Medical History Notes: hx skin cancer Immunizations Pneumococcal Vaccine: Received Pneumococcal Vaccination: Yes Implantable Devices None Hospitalization / Surgery History Type of Hospitalization/Surgery Right toe amp. 1,2,5 endarterectomy femoral cervical disk elbow surgery femoral popliteal bypass graft Lung surgery for empyemea patch angioplasty spinal cord stimulator vein harvest Family and Social History Unknown History: Yes; Current some day smoker; Marital Status - Married; Alcohol Use: Never; Drug Use: No History; Caffeine Use: Rarely; Financial Concerns: No; Food, Clothing or Shelter Needs: No; Support System Lacking: No; Transportation Concerns: No Engineer, maintenance) Signed: 07/04/2020 5:20:40 PM By: [name] Signed: 07/04/2020 6:18:32 PM By: Wayne Hammock RN Entered By: Wayne Pratt on 07/04/2020 15:26:49 -------------------------------------------------------------------------------- SuperBill Details Patient Name: Date of Service: Wayne Pratt, Wayne RY W. 07/04/2020 Medical Record Number: [id number] Patient Account Number: [id number] Date of Birth/Sex: Treating RN: 11/13/1960 (60 y.o. 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

## 2020-07-09 ENCOUNTER — Other Ambulatory Visit: Payer: Self-pay

## 2020-07-09 ENCOUNTER — Encounter (HOSPITAL_BASED_OUTPATIENT_CLINIC_OR_DEPARTMENT_OTHER): Payer: PPO | Admitting: Internal Medicine

## 2020-07-09 DIAGNOSIS — E11622 Type 2 diabetes mellitus with other skin ulcer: Secondary | ICD-10-CM | POA: Diagnosis not present

## 2020-07-09 DIAGNOSIS — L97212 Non-pressure chronic ulcer of right calf with fat layer exposed: Secondary | ICD-10-CM | POA: Diagnosis not present

## 2020-07-09 NOTE — Progress Notes (Signed)
GRAYDEN, BURLEY ([id number]) Visit Report for 07/09/2020 Arrival Information Details Patient Name: Date of Service: Wayne Pratt 07/09/2020 2:30 PM Medical Record Number: [id number] Patient Account Number: [id number] Date of Birth/Sex: Treating RN: 08-05-1960 (60 y.o. [name], [name] Primary Care Zykeriah Mathia: [name] Other Clinician: Referring Saifullah Jolley: Treating Petina Muraski/Extender: Fredderick Severance Weeks in Treatment: 0 Visit Information History Since Last Visit Added or deleted any medications: No Patient Arrived: Wheel Chair Any new allergies or adverse reactions: No Arrival Time: 14:52 Had a fall or experienced change in No Accompanied By: wife activities of daily living that may affect Transfer Assistance: None risk of falls: Patient Identification Verified: Yes Signs or symptoms of abuse/neglect since last visito No Secondary Verification Process Completed: Yes Hospitalized since last visit: No Patient Requires Transmission-Based Precautions: No Implantable device outside of the clinic excluding No Patient Has Alerts: Yes cellular tissue based products placed in the center Patient Alerts: Patient on Blood Thinner since last visit: ABI's: R:1.05 03/22 Has Dressing in Place as Prescribed: Yes Pain Present Now: Yes Electronic Signature(s) Signed: 07/09/2020 6:02:58 PM By: [name] RN Entered By: [name] on 07/09/2020 14:56:06 -------------------------------------------------------------------------------- Encounter Discharge Information Details Patient Name: Date of Service: Wayne Pratt, [name] 07/09/2020 2:30 PM Medical Record Number: [id number] Patient Account Number: [id number] Date of Birth/Sex: Treating RN: 1960-07-13 (60 y.o. [name] Primary Care Irys Nigh: [name] Other Clinician: Referring Breyer Tejera: Treating Tracey Stewart/Extender: Fredderick Severance Weeks in Treatment: 0 Encounter Discharge  Information Items Post Procedure Vitals Discharge Condition: Stable Temperature (F): 98.5 Ambulatory Status: Wheelchair Pulse (bpm): 103 Discharge Destination: Home Respiratory Rate (breaths/min): 17 Transportation: Private Auto Blood Pressure (mmHg): 112/74 Accompanied By: self Schedule Follow-up Appointment: Yes Clinical Summary of Care: Electronic Signature(s) Signed: 07/09/2020 6:10:10 PM By: Deon Pilling Entered By: Deon Pilling on 07/09/2020 18:06:33 -------------------------------------------------------------------------------- Lower Extremity Assessment Details Patient Name: Date of Service: Wayne Pratt. 07/09/2020 2:30 PM Medical Record Number: [id number] Patient Account Number: [id number] Date of Birth/Sex: Treating RN: Mar 09, 1960 (60 y.o. [name], [name] Primary Care Audrina Marten: [name] Other Clinician: Referring Ileanna Gemmill: Treating Joanette Silveria/Extender: Fredderick Severance Weeks in Treatment: 0 Edema Assessment Assessed: [Left: No] [Right: Yes] Edema: [Left: Ye] [Right: s] Calf Left: Right: Point of Measurement: 34 cm From Medial Instep 44 cm Ankle Left: Right: Point of Measurement: 9 cm From Medial Instep 28 cm Vascular Assessment Pulses: Dorsalis Pedis Palpable: [Right:Yes] Posterior Tibial Palpable: [Right:Yes] Electronic Signature(s) Signed: 07/09/2020 6:02:58 PM By: [name] RN Entered By: [name] on 07/09/2020 15:01:37 -------------------------------------------------------------------------------- Multi Wound Chart Details Patient Name: Date of Service: Wayne Pratt, [name] 07/09/2020 2:30 PM Medical Record Number: [id number] Patient Account Number: [id number] Date of Birth/Sex: Treating RN: 03-Mar-1960 (60 y.o. Wayne Pratt Primary Care Sakai Heinle: [name] Other Clinician: Referring Sujay Grundman: Treating Yolandra Habig/Extender: Fredderick Severance Weeks in Treatment: 0 Vital  Signs Height(in): 73 Capillary Blood Glucose(mg/dl): 131 Weight(lbs): 215 Pulse(bpm): 103 Body Mass Index(BMI): 28 Blood Pressure(mmHg): 112/74 Temperature(F): 98.5 Respiratory Rate(breaths/min): 17 Photos: [1:No Photos Right, Posterior Lower Leg] [N/A:N/A N/A] Wound Location: [1:Surgical Injury] [N/A:N/A] Wounding Event: [1:Diabetic Wound/Ulcer of the Lower] [N/A:N/A] Primary Etiology: [1:Extremity Anemia, Asthma, Sleep Apnea,] [N/A:N/A] Comorbid History: [1:Hypertension, Peripheral Arterial Disease, Type II Diabetes, Rheumatoid Arthritis, Neuropathy 02/04/2020] [N/A:N/A] Date Acquired: [1:0] [N/A:N/A] Weeks of Treatment: [1:Open] [N/A:N/A] Wound Status: [1:6x3.4x0.1] [N/A:N/A] Measurements L x W x D (cm) [1:16.022] [N/A:N/A] A (cm) : rea [1:1.602] [N/A:N/A] Volume (cm) : [1:89.10%] [N/A:N/A] % Reduction in  A rea: [1:89.10%] [N/A:N/A] % Reduction in Volume: [1:Grade 2] [N/A:N/A] Classification: [1:Medium] [N/A:N/A] Exudate A mount: [1:Serosanguineous] [N/A:N/A] Exudate Type: [1:red, brown] [N/A:N/A] Exudate Color: [1:Distinct, outline attached] [N/A:N/A] Wound Margin: [1:Small (1-33%)] [N/A:N/A] Granulation A mount: [1:Red, Pink] [N/A:N/A] Granulation Quality: [1:Large (67-100%)] [N/A:N/A] Necrotic A mount: [1:Fascia: No] [N/A:N/A] Exposed Structures: [1:Fat Layer (Subcutaneous Tissue): No Tendon: No Muscle: No Joint: No Bone: No Small (1-33%)] [N/A:N/A] Treatment Notes Electronic Signature(s) Signed: 07/09/2020 4:56:05 PM By: Levan Hurst RN, BSN Signed: 07/09/2020 4:58:19 PM By: [name] Entered By: Linton Ham on 07/09/2020 15:45:15 -------------------------------------------------------------------------------- Multi-Disciplinary Care Plan Details Patient Name: Date of Service: Wayne Pratt, [name] 07/09/2020 2:30 PM Medical Record Number: [id number] Patient Account Number: [id number] Date of Birth/Sex: Treating RN: October 09, 1960 (60 y.o. Wayne Pratt Primary Care Lamyra Malcolm: [name] Other Clinician: Referring Shontavia Mickel: Treating Addysyn Fern/Extender: Christel Mormon in Treatment: 0 Multidisciplinary Care Plan reviewed with physician Active Inactive Nutrition Nursing Diagnoses: Impaired glucose control: actual or potential Potential for alteratiion in Nutrition/Potential for imbalanced nutrition Goals: Patient/caregiver will maintain therapeutic glucose control Date Initiated: 07/04/2020 Target Resolution Date: 08/01/2020 Goal Status: Active Interventions: Assess HgA1c results as ordered upon admission and as needed Provide education on elevated blood sugars and impact on wound healing Provide education on nutrition Treatment Activities: Patient referred to Primary Care Physician for further nutritional evaluation : 07/04/2020 Notes: Tissue Oxygenation Nursing Diagnoses: Actual ineffective tissue perfusion; peripheral (select once diagnosis is confirmed) Knowledge deficit related to disease process and management Goals: Patient/caregiver will verbalize understanding of disease process and disease management Date Initiated: 07/04/2020 Target Resolution Date: 08/01/2020 Goal Status: Active Interventions: Assess patient understanding of disease process and management upon diagnosis and as needed Assess peripheral arterial status upon admission and as needed Treatment Activities: Revascularization procedures : 07/04/2020 Notes: Wound/Skin Impairment Nursing Diagnoses: Impaired tissue integrity Knowledge deficit related to ulceration/compromised skin integrity Goals: Patient/caregiver will verbalize understanding of skin care regimen Date Initiated: 07/04/2020 Target Resolution Date: 08/01/2020 Goal Status: Active Ulcer/skin breakdown will have a volume reduction of 30% by week 4 Date Initiated: 07/04/2020 Target Resolution Date: 08/01/2020 Goal Status: Active Interventions: Assess  patient/caregiver ability to obtain necessary supplies Assess patient/caregiver ability to perform ulcer/skin care regimen upon admission and as needed Assess ulceration(s) every visit Provide education on ulcer and skin care Treatment Activities: Skin care regimen initiated : 07/04/2020 Topical wound management initiated : 07/04/2020 Notes: Electronic Signature(s) Signed: 07/09/2020 4:56:05 PM By: Levan Hurst RN, BSN Entered By: Levan Hurst on 07/09/2020 15:50:19 -------------------------------------------------------------------------------- Pain Assessment Details Patient Name: Date of Service: Wayne Pratt, Wayne Pray W. 07/09/2020 2:30 PM Medical Record Number: [id number] Patient Account Number: [id number] Date of Birth/Sex: Treating RN: 1960/02/01 (60 y.o. [name], [name] Primary Care Darnell Jeschke: [name] Other Clinician: Referring Sanayah Munro: Treating Welma Mccombs/Extender: Fredderick Severance Weeks in Treatment: 0 Active Problems Location of Pain Severity and Description of Pain Patient Has Paino Yes Site Locations Pain Location: Pain Location: Generalized Pain, Pain in Ulcers With Dressing Change: Yes Duration of the Pain. Constant / Intermittento Constant Rate the pain. Current Pain Level: 9 Worst Pain Level: 10 Least Pain Level: 0 Tolerable Pain Level: 9 Character of Pain Describe the Pain: Aching Pain Management and Medication Current Pain Management: Medication: Yes Cold Application: No Rest: Yes Massage: No Activity: No T.E.N.S.: No Heat Application: No Leg drop or elevation: No Is the Current Pain Management Adequate: Adequate How does your wound impact your activities of daily livingo Sleep: No Bathing: No Appetite:  No Relationship With Others: No Bladder Continence: No Emotions: No Bowel Continence: No Work: No Toileting: No Drive: No Dressing: No Hobbies: No Electronic Signature(s) Signed: 07/09/2020 6:02:58 PM By: [name] RN Entered By: [name] on 07/09/2020 14:56:46 -------------------------------------------------------------------------------- Patient/Caregiver Education Details Patient Name: Date of Service: Wayne Pratt, [employer] 6/29/2022andnbsp2:30 PM Medical Record Number: [id number] Patient Account Number: [id number] Date of Birth/Gender: Treating RN: Feb 05, 1960 (60 y.o. Wayne Pratt Primary Care Physician: [name] Other Clinician: Referring Physician: Treating Physician/Extender: Christel Mormon in Treatment: 0 Education Assessment Education Provided To: Patient Education Topics Provided Wound/Skin Impairment: Methods: Explain/Verbal Responses: State content correctly Electronic Signature(s) Signed: 07/09/2020 4:56:05 PM By: Levan Hurst RN, BSN Entered By: Levan Hurst on 07/09/2020 16:00:33 -------------------------------------------------------------------------------- Wound Assessment Details Patient Name: Date of Service: Wayne Pratt, [name] 07/09/2020 2:30 PM Medical Record Number: [id number] Patient Account Number: [id number] Date of Birth/Sex: Treating RN: Mar 31, 1960 (60 y.o. [name], [name] Primary Care Placida Cambre: [name] Other Clinician: Referring Darreld Hoffer: Treating Feliciana Narayan/Extender: Fredderick Severance Weeks in Treatment: 0 Wound Status Wound Number: 1 Primary Diabetic Wound/Ulcer of the Lower Extremity Etiology: Wound Location: Right, Posterior Lower Leg Wound Open Wounding Event: Surgical Injury Status: Date Acquired: 02/04/2020 Comorbid Anemia, Asthma, Sleep Apnea, Hypertension, Peripheral Arterial Weeks Of Treatment: 0 History: Disease, Type II Diabetes, Rheumatoid Arthritis, Neuropathy Clustered Wound: No Photos Wound Measurements Length: (cm) 6 Width: (cm) 3.4 Depth: (cm) 0.1 Area: (cm) 16.022 Volume: (cm) 1.602 % Reduction in Area: 89.1% % Reduction in Volume:  89.1% Epithelialization: Small (1-33%) Tunneling: No Undermining: No Wound Description Classification: Grade 2 Wound Margin: Distinct, outline attached Exudate Amount: Medium Exudate Type: Serosanguineous Exudate Color: red, brown Foul Odor After Cleansing: No Slough/Fibrino Yes Wound Bed Granulation Amount: Small (1-33%) Exposed Structure Granulation Quality: Red, Pink Fascia Exposed: No Necrotic Amount: Large (67-100%) Fat Layer (Subcutaneous Tissue) Exposed: No Necrotic Quality: Adherent Slough Tendon Exposed: No Muscle Exposed: No Joint Exposed: No Bone Exposed: No Treatment Notes Wound #1 (Lower Leg) Wound Laterality: Right, Posterior Cleanser Peri-Wound Care Topical Primary Dressing Santyl Ointment Discharge Instruction: Apply nickel thick amount to wound bed as instructed Secondary Dressing Woven Gauze Sponge, Non-Sterile 4x4 in Discharge Instruction: Apply over primary dressing as directed. ABD Pad, 5x9 Discharge Instruction: Apply over primary dressing as directed. Secured With [employer] 4x5 (in/yd) Discharge Instruction: Secure with Coban not too tight Kerlix Roll Sterile, 4.5x3.1 (in/yd) Discharge Instruction: Secure with Kerlix as directed. Paper Tape, 2x10 (in/yd) Discharge Instruction: Secure dressing with tape as directed. Compression Wrap Compression Stockings Add-Ons Electronic Signature(s) Signed: 07/09/2020 5:02:46 PM By: Sandre Kitty Signed: 07/09/2020 6:02:58 PM By: [name] RN Entered By: Sandre Kitty on 07/09/2020 16:40:27 -------------------------------------------------------------------------------- Vitals Details Patient Name: Date of Service: FA Wayne Pratt, [name] 07/09/2020 2:30 PM Medical Record Number: [id number] Patient Account Number: [id number] Date of Birth/Sex: Treating RN: 03/28/1960 (60 y.o. [name], [name] Primary Care Haidan Nhan: [name] Other Clinician: Referring Amarria Andreasen: Treating  Devlynn Knoff/Extender: Fredderick Severance Weeks in Treatment: 0 Vital Signs Time Taken: 14:55 Temperature (F): 98.5 Height (in): 73 Pulse (bpm): 103 Weight (lbs): 215 Respiratory Rate (breaths/min): 17 Body Mass Index (BMI): 28.4 Blood Pressure (mmHg): 112/74 Capillary Blood Glucose (mg/dl): 131 Reference Range: 80 - 120 mg / dl Electronic Signature(s) Signed: 07/09/2020 6:02:58 PM By: [name] RN Entered By: [name] on 07/09/2020 14:56:11

## 2020-07-09 NOTE — Progress Notes (Signed)
RANDAL, GOENS ([id number]) Visit Report for 07/09/2020 Debridement Details Patient Name: Date of Service: Wayne Pratt. 07/09/2020 2:30 PM Medical Record Number: [id number] Patient Account Number: [id number] Date of Birth/Sex: Treating RN: [birth year] (60 y.o. [name] Primary Care Provider: [name] Other Clinician: Referring Provider: Treating Provider/Extender: Fredderick Severance Weeks in Treatment: 0 Debridement Performed for Assessment: Wound #1 Right,Posterior Lower Leg Performed By: Clinician Levan Hurst, RN Debridement Type: Chemical/Enzymatic/Mechanical Agent Used: Santyl Severity of Tissue Pre Debridement: Fat layer exposed Level of Consciousness (Pre-procedure): Awake and Alert Pre-procedure Verification/Time Out Yes - 15:45 Taken: Bleeding: None Response to Treatment: Procedure was tolerated well Level of Consciousness (Post- Awake and Alert procedure): Post Debridement Measurements of Total Wound Length: (cm) 6 Width: (cm) 3.4 Depth: (cm) 0.1 Volume: (cm) 1.602 Character of Wound/Ulcer Post Debridement: Requires Further Debridement Severity of Tissue Post Debridement: Fat layer exposed Post Procedure Diagnosis Same as Pre-procedure Electronic Signature(s) Signed: 07/09/2020 4:56:05 PM By: Levan Hurst RN, BSN Signed: 07/09/2020 4:58:19 PM By: [name] Entered By: Levan Hurst on 07/09/2020 16:52:14 -------------------------------------------------------------------------------- HPI Details Patient Name: Date of Service: Wayne Pratt, [name] 07/09/2020 2:30 PM Medical Record Number: [id number] Patient Account Number: [id number] Date of Birth/Sex: Treating RN: July 05, 1960 (60 y.o. [name] Primary Care Provider: [name] Other Clinician: Referring Provider: Treating Provider/Extender: Fredderick Severance Weeks in Treatment: 0 History of Present Illness HPI Description:  ADMISSION 07/04/2020 This is a 60 year old woman who presented to Dr. Carlis Abbott in January of this year having critical limb ischemia. First angiogram was on 01/31/2020 showed proximal profound disease in the SFA with right foot runoff via diseased posterior tibia. He underwent a right femoral endarterectomy with a right common femoral to below-knee popliteal bypass on 02/04/2020. There were generally good vascular results however the day after the surgery according to his wife he developed a black eschar at presumably ischemic spot on the right posterior calf. He also ultimately had amputations on 02/29/2020 of the right first second and fifth toes in March she had a wound VAC on the right calf but this was too painful and was switched to wet-to-dry. At 1 point this clearly had exposed muscle and the wife was able to document this by showing me short movie on his phone. He had an operative debridement on the right calf in early April with a skin substitute oMyriad. In any case none of this really made any difference. He was hospitalized early in June with bilateral pulmonary emboli and is now on Eliquis. Long medical problem list but predominantly type 2 diabetes with peripheral neuropathy, spinal stenosis. He is a smoker Most recent arterial studies were done on 03/18/2020 on the right this showed a an ABI in the posterior tibial of 1.05 with biphasic waveforms on the left noncompressible at 1.38 but with a great toe pressure near normal at 1.11 again with triphasic waveforms. 6/29; patient arrives back in clinic. We use Santyl on the posterior calf wound on the right. Arrives in clinic with this looking a lot better. Healthy red granulated tissue over the majority of this. Electronic Signature(s) Signed: 07/09/2020 4:58:19 PM By: [name] Entered By: Linton Ham on 07/09/2020 15:46:02 -------------------------------------------------------------------------------- Physical Exam  Details Patient Name: Date of Service: Wayne Pratt, [name] 07/09/2020 2:30 PM Medical Record Number: [id number] Patient Account Number: [id number] Date of Birth/Sex: Treating RN: October 16, 1960 (60 y.o. [name] Primary Care Provider: [name] Other Clinician: Referring Provider: Treating  Provider/Extender: Martie Lee, Alethea Weeks in Treatment: 0 Cardiovascular Difficult to feel a dorsalis pedis pulse however the posterior tibial pulses palpable. Notes Wound exam; significant wound on the right posterior calf. Required an aggressive debridement last week. Arrives in clinic today with a wound looking a lot better. I was able to remove some surface slough with gauze and wound cleanser. Generally much better in terms of wound surface although the dimensions of the wound are not too much better. Electronic Signature(s) Signed: 07/09/2020 4:58:19 PM By: [name] Entered By: Linton Ham on 07/09/2020 15:47:01 -------------------------------------------------------------------------------- Physician Orders Details Patient Name: Date of Service: Wayne Pratt, [name] 07/09/2020 2:30 PM Medical Record Number: [id number] Patient Account Number: [id number] Date of Birth/Sex: Treating RN: 02/18/1960 (60 y.o. 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 - with Dr. Dellia Nims Return A Bathing/ Shower/ Hygiene May shower and wash wound with soap and water. Edema Control - Lymphedema / SCD / Other Elevate legs to the level of the heart or above for 30 minutes  daily and/or when sitting, a frequency of: - 3-4 times per day Avoid standing for long periods of time. Exercise regularly Wound Treatment Wound #1 - Lower Leg Wound Laterality: Right, Posterior Prim Dressing: Santyl Ointment 1 x Per Day/30 Days ary Discharge Instructions: Apply nickel thick amount to wound bed as instructed Secondary Dressing: Woven Gauze Sponge, Non-Sterile 4x4 in (Generic) 1 x Per Day/30 Days Discharge Instructions: Apply over primary dressing as directed. Secondary Dressing: ABD Pad, 5x9 (Generic) 1 x Per Day/30 Days Discharge Instructions: Apply over primary dressing as directed. Secured With: Coban Self-Adherent Wrap 4x5 (in/yd) (Generic) 1 x Per Day/30 Days Discharge Instructions: Secure with Coban not too tight Secured With: [employer], 4.5x3.1 (in/yd) (Generic) 1 x Per Day/30 Days Discharge Instructions: Secure with Kerlix as directed. Secured With: Paper Tape, 2x10 (in/yd) (Generic) 1 x Per Day/30 Days Discharge Instructions: Secure dressing with tape as directed. Electronic Signature(s) Signed: 07/09/2020 4:56:05 PM By: Levan Hurst RN, BSN Signed: 07/09/2020 4:58:19 PM By: [name] Entered By: Levan Hurst on 07/09/2020 15:44:19 -------------------------------------------------------------------------------- Problem List Details Patient Name: Date of Service: [name], [name] 07/09/2020 2:30 PM Medical Record Number: [id number] Patient Account Number: [id number] Date of Birth/Sex: Treating RN: 1960-06-01 (60 y.o. [name] Primary Care Provider: [name] Other Clinician: Referring Provider: Treating Provider/Extender: Fredderick Severance Weeks in Treatment: 0 Active Problems ICD-10 Encounter Code Description Active Date MDM Diagnosis E11.51 Type 2 diabetes mellitus with diabetic peripheral angiopathy without gangrene 07/04/2020 No Yes L97.218 Non-pressure chronic ulcer of right calf with other  specified severity 07/04/2020 No Yes Inactive Problems Resolved Problems Electronic Signature(s) Signed: 07/09/2020 4:58:19 PM By: [name] Entered By: Linton Ham on 07/09/2020 15:45:05 -------------------------------------------------------------------------------- Progress Note Details Patient Name: Date of Service: Wayne Pratt, [name] 07/09/2020 2:30 PM Medical Record Number: [id number] Patient Account Number: [id number] Date of Birth/Sex: Treating RN: 02/13/1960 (60 y.o. [name] Primary Care Provider: [name] Other Clinician: Referring Provider: Treating Provider/Extender: Fredderick Severance Weeks in Treatment: 0 Subjective History of Present Illness (HPI) ADMISSION 07/04/2020 This is a 60 year old woman who presented to  Dr. Carlis Abbott in January of this year having critical limb ischemia. First angiogram was on 01/31/2020 showed proximal profound disease in the SFA with right foot runoff via diseased posterior tibia. He underwent a right femoral endarterectomy with a right common femoral to below-knee popliteal bypass on 02/04/2020. There were generally good vascular results however the day after the surgery according to his wife he developed a black eschar at presumably ischemic spot on the right posterior calf. He also ultimately had amputations on 02/29/2020 of the right first second and fifth toes in March she had a wound VAC on the right calf but this was too painful and was switched to wet-to-dry. At 1 point this clearly had exposed muscle and the wife was able to document this by showing me short movie on his phone. He had an operative debridement on the right calf in early April with a skin substitute oMyriad. In any case none of this really made any difference. He was hospitalized early in June with bilateral pulmonary emboli and is now on Eliquis. Long medical problem list but predominantly type 2 diabetes with peripheral neuropathy, spinal  stenosis. He is a smoker Most recent arterial studies were done on 03/18/2020 on the right this showed a an ABI in the posterior tibial of 1.05 with biphasic waveforms on the left noncompressible at 1.38 but with a great toe pressure near normal at 1.11 again with triphasic waveforms. 6/29; patient arrives back in clinic. We use Santyl on the posterior calf wound on the right. Arrives in clinic with this looking a lot better. Healthy red granulated tissue over the majority of this. Objective Constitutional Vitals Time Taken: 2:55 PM, Height: 73 in, Weight: 215 lbs, BMI: 28.4, Temperature: 98.5 F, Pulse: 103 bpm, Respiratory Rate: 17 breaths/min, Blood Pressure: 112/74 mmHg, Capillary Blood Glucose: 131 mg/dl. Cardiovascular Difficult to feel a dorsalis pedis pulse however the posterior tibial pulses palpable. General Notes: Wound exam; significant wound on the right posterior calf. Required an aggressive debridement last week. Arrives in clinic today with a wound looking a lot better. I was able to remove some surface slough with gauze and wound cleanser. Generally much better in terms of wound surface although the dimensions of the wound are not too much better. Integumentary (Hair, Skin) Wound #1 status is Open. Original cause of wound was Surgical Injury. The date acquired was: 02/04/2020. The wound is located on the Right,Posterior Lower Leg. The wound measures 6cm length x 3.4cm width x 0.1cm depth; 16.022cm^2 area and 1.602cm^3 volume. There is no tunneling or undermining noted. There is a medium amount of serosanguineous drainage noted. The wound margin is distinct with the outline attached to the wound base. There is small (1-33%) red, pink granulation within the wound bed. There is a large (67-100%) amount of necrotic tissue within the wound bed including Adherent Slough. Assessment Active Problems ICD-10 Type 2 diabetes mellitus with diabetic peripheral angiopathy without  gangrene Non-pressure chronic ulcer of right calf with other specified severity Procedures Wound #1 Pre-procedure diagnosis of Wound #1 is a Diabetic Wound/Ulcer of the Lower Extremity located on the Right,Posterior Lower Leg .Severity of Tissue Pre Debridement is: Fat layer exposed. There was a Chemical/Enzymatic/Mechanical debridement performed by Levan Hurst, RN.[name] Kitchen Agent used was [employer]. A time out was conducted at 15:45, prior to the start of the procedure. There was no bleeding. The procedure was tolerated well. Post Debridement Measurements: 6cm length x 3.4cm width x 0.1cm depth; 1.602cm^3 volume. Character of Wound/Ulcer Post Debridement requires  further debridement. Severity of Tissue Post Debridement is: Fat layer exposed. Post procedure Diagnosis Wound #1: Same as Pre-Procedure Plan Follow-up Appointments: Return Appointment in 1 week. - with Dr. Dellia Nims Bathing/ Shower/ Hygiene: May shower and wash wound with soap and water. Edema Control - Lymphedema / SCD / Other: Elevate legs to the level of the heart or above for 30 minutes daily and/or when sitting, a frequency of: - 3-4 times per day Avoid standing for long periods of time. Exercise regularly WOUND #1: - Lower Leg Wound Laterality: Right, Posterior Prim Dressing: Santyl Ointment 1 x Per Day/30 Days ary Discharge Instructions: Apply nickel thick amount to wound bed as instructed Secondary Dressing: Woven Gauze Sponge, Non-Sterile 4x4 in (Generic) 1 x Per Day/30 Days Discharge Instructions: Apply over primary dressing as directed. Secondary Dressing: ABD Pad, 5x9 (Generic) 1 x Per Day/30 Days Discharge Instructions: Apply over primary dressing as directed. Secured With: Coban Self-Adherent Wrap 4x5 (in/yd) (Generic) 1 x Per Day/30 Days Discharge Instructions: Secure with Coban not too tight Secured With: [employer], 4.5x3.1 (in/yd) (Generic) 1 x Per Day/30 Days Discharge Instructions: Secure with Kerlix as  directed. Secured With: Paper T ape, 2x10 (in/yd) (Generic) 1 x Per Day/30 Days Discharge Instructions: Secure dressing with tape as directed. 1. I am continuing with Santyl to this wound for another week. 2. Should be able to change to [facility] perhaps silver collagen next week. We are using ABDs and kerlix 2. status post revascularization Electronic Signature(s) Signed: 07/09/2020 4:56:05 PM By: Levan Hurst RN, BSN Signed: 07/09/2020 4:58:19 PM By: [name] Entered By: Levan Hurst on 07/09/2020 16:52:32 -------------------------------------------------------------------------------- SuperBill Details Patient Name: Date of Service: Wayne Pratt, [name] 07/09/2020 Medical Record Number: [id number] Patient Account Number: [id number] Date of Birth/Sex: Treating RN: Nov 20, 1960 (60 y.o. 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

## 2020-07-15 ENCOUNTER — Other Ambulatory Visit: Payer: Self-pay

## 2020-07-15 ENCOUNTER — Ambulatory Visit (INDEPENDENT_AMBULATORY_CARE_PROVIDER_SITE_OTHER): Payer: PPO | Admitting: Vascular Surgery

## 2020-07-15 ENCOUNTER — Encounter: Payer: Self-pay | Admitting: Vascular Surgery

## 2020-07-15 VITALS — BP 105/67 | HR 104 | Temp 98.2°F | Resp 18 | Ht 73.0 in | Wt 225.0 lb

## 2020-07-15 DIAGNOSIS — I70229 Atherosclerosis of native arteries of extremities with rest pain, unspecified extremity: Secondary | ICD-10-CM

## 2020-07-15 NOTE — Progress Notes (Signed)
Patient name: Wayne Pratt MRN: [id number] DOB: 12/25/1960 Sex: male  REASON FOR VISIT: 1 month follow-up right leg wound check  HPI: Wayne Pratt is a 60 y.o. male with multiple medical problems including DM, HTN, HLD who presents for 1 month follow-up for wound check of his right calf wound.  He underwent right common femoral endarterectomy with profundoplasty and bovine patch with a right common femoral to below-knee pop bypass with saphenous vein on 02/04/2020 for CLI with rest pain.  At the time of his bypass had advanced ischemia with ABIs that were 0.  He subsequently underwent partial toe amputations of right toes 1 2 and 5.  The toe amputations healed but unfortunately had a calf wound that has persisted.  He has undergone debridement and then additional debridement with myriad application as a skin substitute.  We since referred him to the wound clinic.  He is now seeing Dr. Dellia Nims at the wound clinic.  Last visit we took him out of vacuum dressing and placed him in a wet-to-dry.  He is now placing Santyl on the wound.  He sees Dr. Dellia Nims weekly.  Past Medical History:  Diagnosis Date   Anemia    takes Iron pill daily   Anxiety    takes Xanax daily, 02/28/20- no longer   Arthritis    Asthma    Cancer ([place])    skin    Diabetes mellitus ([place])    takes Metformin daily   Headache    migraine  - not lately   History of bronchitis    mid 90's   History of hypertension    lost weight and been off meds over a yr   History of kidney stones    Passed 32   History of pleural empyema    Hypercholesterolemia    lost weight and been off meds over a yr   Hypertension    Insomnia    takes Trazodone nightly   Neuropathy    Pneumonia    hx of-15+ yrs ago   Sleep apnea    Stop Bang score of 7   Weakness    numbness and tingling    Past Surgical History:  Procedure Laterality Date   ABDOMINAL AORTOGRAM W/LOWER EXTREMITY N/A 01/31/2020   Procedure: ABDOMINAL AORTOGRAM W/LOWER  EXTREMITY;  Surgeon: Marty Heck, MD;  Location: [facility];  Service: Cardiovascular;  Laterality: N/A;   AMPUTATION Right 02/29/2020   Procedure: RIGHT TOE AMPUTATIONS, 1, 2, 5;  Surgeon: Marty Heck, MD;  Location: [place];  Service: Vascular;  Laterality: Right;   APPLICATION OF WOUND VAC Right 02/29/2020   Procedure: APPLICATION OF WOUND VAC;  Surgeon: Marty Heck, MD;  Location: [facility];  Service: Vascular;  Laterality: Right;   APPLICATION OF WOUND VAC Right 04/16/2020   Procedure: APPLICATION OF WOUND VAC;  Surgeon: Serafina Mitchell, MD;  Location: [place];  Service: Vascular;  Laterality: Right;   BIOPSY  10/05/2011   Dr. Oneida Alar :non-erosive gastritis   BIOPSY  08/06/2014   Procedure: BIOPSY;  Surgeon: Danie Binder, MD;  Location: [facility];  Service: Endoscopy;;   CERVICAL DISC SURGERY     ELBOW SURGERY Right    ENDARTERECTOMY FEMORAL Right 02/04/2020   Procedure: RIGHT FEMORAL ENDARTERECTOMY with Profundaplasty;  Surgeon: Marty Heck, MD;  Location: [place];  Service: Vascular;  Laterality: Right;   ESOPHAGOGASTRODUODENOSCOPY  Sept 2013   Dr. Oneida Alar: chronic inactive gastritis  ESOPHAGOGASTRODUODENOSCOPY N/A 08/18/2017   Procedure: ESOPHAGOGASTRODUODENOSCOPY (EGD);  Surgeon: Milus Banister, MD;  Location: [facility];  Service: Endoscopy;  Laterality: N/A;   ESOPHAGOGASTRODUODENOSCOPY N/A 03/09/2018   Procedure: ESOPHAGOGASTRODUODENOSCOPY (EGD);  Surgeon: Milus Banister, MD;  Location: [facility];  Service: Endoscopy;  Laterality: N/A;   ESOPHAGOGASTRODUODENOSCOPY (EGD) WITH PROPOFOL N/A 08/06/2014   mild non-erosive gastritis, duodenitis   EUS N/A 08/18/2017   Procedure: UPPER ENDOSCOPIC ULTRASOUND (EUS) RADIAL;  Surgeon: Milus Banister, MD;  Location: [facility];  Service: Endoscopy;  Laterality: N/A;   EUS N/A 03/09/2018   Procedure: UPPER ENDOSCOPIC ULTRASOUND (EUS) RADIAL;  Surgeon: Milus Banister, MD;  Location: [facility];  Service:  Endoscopy;  Laterality: N/A;   FEMORAL-POPLITEAL BYPASS GRAFT Right 02/04/2020   Procedure: BYPASS GRAFT FEMORAL-POPLITEAL ARTERY With Vein Harvest;  Surgeon: Marty Heck, MD;  Location: [place];  Service: Vascular;  Laterality: Right;   FINE NEEDLE ASPIRATION  03/09/2018   Procedure: FINE NEEDLE ASPIRATION;  Surgeon: Milus Banister, MD;  Location: [facility];  Service: Endoscopy;;   FLEXIBLE SIGMOIDOSCOPY  10/05/2011   Dr. Fields:poor prep   I & D EXTREMITY Right 04/16/2020   Procedure: RIGHT CALF WOUND IRRIGATION, DEBRIDEMENT AND GRAFTING;  Surgeon: Serafina Mitchell, MD;  Location: [place];  Service: Vascular;  Laterality: Right;   LUNG SURGERY  2010   for empyema   PATCH ANGIOPLASTY Right 02/04/2020   Procedure: PATCH ANGIOPLASTY;  Surgeon: Marty Heck, MD;  Location: [place];  Service: Vascular;  Laterality: Right;   SPINAL CORD STIMULATOR INSERTION N/A 08/01/2015   Procedure: Spinal Cord Stimulator Implant;  Surgeon: Eustace Moore, MD;  Location: [facility];  Service: Neurosurgery;  Laterality: N/A;   TONSILLECTOMY     VASECTOMY Bilateral 1995   VEIN HARVEST Right 02/04/2020   Procedure: VEIN HARVEST Greater Saphenous Vein;  Surgeon: Marty Heck, MD;  Location: [place];  Service: Vascular;  Laterality: Right;   WOUND DEBRIDEMENT Right 02/29/2020   Procedure: [place];  Surgeon: Marty Heck, MD;  Location: [place];  Service: Vascular;  Laterality: Right;    Family History  Problem Relation Age of Onset   Prostate cancer Other        all male members on mother's side   Heart attack Father    COPD Maternal Grandmother    Cancer Paternal Grandmother    Colon cancer Neg Hx     SOCIAL HISTORY: Social History   Tobacco Use   Smoking status: Former    Packs/day: 1.00    Years: 39.00    Pack years: 39.00    Types: Cigarettes    Quit date: 02/04/2020    Years since quitting: 0.4   Smokeless tobacco: Never  Substance Use Topics   Alcohol  use: No    Alcohol/week: 0.0 standard drinks    Allergies  Allergen Reactions   Penicillins Anaphylaxis and Other (See Comments)    Has patient had a PCN reaction causing immediate rash, facial/tongue/throat swelling, SOB or lightheadedness with hypotension: Yes Has patient had a PCN reaction causing severe rash involving mucus membranes or skin necrosis: Yes Has patient had a PCN reaction that required hospitalization: No Has patient had a PCN reaction occurring within the last 10 years: No If all of the above answers are "NO", then may proceed with Cephalosporin use.    Vancomycin Anaphylaxis   Ativan [Lorazepam] Other (See Comments)    Violent and Mean  Current Outpatient Medications  Medication Sig Dispense Refill   acetaminophen (TYLENOL) 650 MG CR tablet Take 1,300 mg by mouth every 8 (eight) hours as needed for pain.     albuterol (VENTOLIN HFA) 108 (90 Base) MCG/ACT inhaler Inhale 1-2 puffs into the lungs every 6 (six) hours as needed for wheezing or shortness of breath. 18 g 0   APIXABAN (ELIQUIS) VTE STARTER PACK (10MG AND 5MG) Take as directed on package: start with two-64m tablets twice daily for 7 days. On day 8, switch to one-592mtablet twice daily. 1 each 0   clonazePAM (KLONOPIN) 0.5 MG tablet Take 0.5 mg by mouth 2 (two) times daily.      GLIPIZIDE XL 10 MG 24 hr tablet Take 10 mg by mouth 2 (two) times daily.     HYDROmorphone (DILAUDID) 4 MG tablet Take 4 mg by mouth 4 (four) times daily as needed.     lisinopril (ZESTRIL) 10 MG tablet Take 1 tablet (10 mg total) by mouth daily. Please call to schedule appointment for further refills. Thanks! 30 tablet 0   lovastatin (MEVACOR) 40 MG tablet Take 40 mg by mouth at bedtime.      Melatonin 10 MG TABS Take 40 mg by mouth at bedtime.     metFORMIN (GLUCOPHAGE) 1000 MG tablet Take 1,000 mg by mouth 2 (two) times daily.      pioglitazone (ACTOS) 15 MG tablet Take 15 mg by mouth at bedtime.     apixaban (ELIQUIS) 5 MG TABS  tablet Take 1 tablet (5 mg total) by mouth 2 (two) times daily. (Patient not taking: Reported on 07/15/2020) 60 tablet 2   aspirin EC 81 MG EC tablet Take 1 tablet (81 mg total) by mouth daily at 6 (six) AM. Swallow whole. (Patient not taking: Reported on 07/15/2020) 30 tablet 11   oxyCODONE 10 MG TABS Take 1 tablet (10 mg total) by mouth every 6 (six) hours as needed for breakthrough pain. (Patient not taking: Reported on 07/15/2020) 28 tablet 0   No current facility-administered medications for this visit.    REVIEW OF SYSTEMS:  [X]  denotes positive finding, [ ]  denotes negative finding Cardiac  Comments:  Chest pain or chest pressure:    Shortness of breath upon exertion:    Short of breath when lying flat:    Irregular heart rhythm:        Vascular    Pain in calf, thigh, or hip brought on by ambulation:    Pain in feet at night that wakes you up from your sleep:     Blood clot in your veins:    Leg swelling:         Pulmonary    Oxygen at home:    Productive cough:     Wheezing:         Neurologic    Sudden weakness in arms or legs:     Sudden numbness in arms or legs:     Sudden onset of difficulty speaking or slurred speech:    Temporary loss of vision in one eye:     Problems with dizziness:         Gastrointestinal    Blood in stool:     Vomited blood:         Genitourinary    Burning when urinating:     Blood in urine:        Psychiatric    Major depression:         Hematologic  Bleeding problems:    Problems with blood clotting too easily:        Skin    Rashes or ulcers:        Constitutional    Fever or chills:      PHYSICAL EXAM: Vitals:   07/15/20 1601  BP: 105/67  Pulse: (!) 104  Resp: 18  Temp: 98.2 F (36.8 C)  TempSrc: Temporal  SpO2: 94%  Weight: 225 lb (102.1 kg)  Height: 6' 1"  (1.854 m)    GENERAL: The patient is a well-nourished male, in no acute distress. The vital signs are documented above. CARDIAC: There is a regular rate  and rhythm.  VASCULAR:  Brisk right DP and PT signals Right calf wound pictured below s/p grafting with skin subtitute and now santyl application          DATA:   None  Assessment/Plan:  61 year old male status post right common femoral endarterectomy with profundoplasty and bovine patch and then a right common femoral to BK pop bypass with vein on 02/04/2020 for CLI with rest pain.  All of his toe right amputations have healed but he has persistence of a right calf wound.  We have performed multiple debridements of this in the OR and most recently applied a skin substitute.   He is now going to the wound clinic and appreciate Dr. Janalyn Rouse assistance.  He is now putting Santyl on the wound and doing the local debridements at home.  I think this has made significant progress.  He still has brisk dorsalis pedis and posterior tibial signals in the right foot and his bypass appears to be patent.  I will see him again in 3 months with noninvasive studies.  He will continue to go to the wound clinic in the interim.   Marty Heck, MD [facility] Office: [phone]

## 2020-07-17 ENCOUNTER — Other Ambulatory Visit: Payer: Self-pay

## 2020-07-17 ENCOUNTER — Encounter (HOSPITAL_BASED_OUTPATIENT_CLINIC_OR_DEPARTMENT_OTHER): Payer: PPO | Attending: Internal Medicine | Admitting: Internal Medicine

## 2020-07-17 DIAGNOSIS — M069 Rheumatoid arthritis, unspecified: Secondary | ICD-10-CM | POA: Insufficient documentation

## 2020-07-17 DIAGNOSIS — Z86711 Personal history of pulmonary embolism: Secondary | ICD-10-CM | POA: Insufficient documentation

## 2020-07-17 DIAGNOSIS — E1142 Type 2 diabetes mellitus with diabetic polyneuropathy: Secondary | ICD-10-CM | POA: Insufficient documentation

## 2020-07-17 DIAGNOSIS — E1151 Type 2 diabetes mellitus with diabetic peripheral angiopathy without gangrene: Secondary | ICD-10-CM | POA: Diagnosis not present

## 2020-07-17 DIAGNOSIS — E11622 Type 2 diabetes mellitus with other skin ulcer: Secondary | ICD-10-CM | POA: Diagnosis not present

## 2020-07-17 DIAGNOSIS — L97212 Non-pressure chronic ulcer of right calf with fat layer exposed: Secondary | ICD-10-CM | POA: Diagnosis not present

## 2020-07-17 DIAGNOSIS — L97219 Non-pressure chronic ulcer of right calf with unspecified severity: Secondary | ICD-10-CM | POA: Diagnosis not present

## 2020-07-17 DIAGNOSIS — I70229 Atherosclerosis of native arteries of extremities with rest pain, unspecified extremity: Secondary | ICD-10-CM

## 2020-07-17 NOTE — Progress Notes (Signed)
Wayne Pratt ([id number]) Visit Report for 07/17/2020 Debridement Details Patient Name: Date of Service: Wayne Pratt. 07/17/2020 2:15 PM Medical Record Number: [id number] Patient Account Number: [id number] Date of Birth/Sex: Treating RN: 1960/05/26 (60 y.o. Wayne Pratt Primary Care Provider: Catalina Pratt Other Clinician: Referring Provider: Treating Provider/Extender: Wayne Pratt Weeks in Treatment: 1 Debridement Performed for Assessment: Wound #1 Right,Posterior Lower Leg Performed By: Physician Wayne Pratt Debridement Type: Debridement Severity of Tissue Pre Debridement: Fat layer exposed Level of Consciousness (Pre-procedure): Awake and Alert Pre-procedure Verification/Time Out Yes - 15:00 Taken: Start Time: 15:01 Pain Control: Lidocaine 4% T opical Solution T Area Debrided (L x W): otal 6.2 (cm) x 2.5 (cm) = 15.5 (cm) Tissue and other material debrided: Non-Viable, Slough, Slough Level: Non-Viable Tissue Debridement Description: Selective/Open Wound Instrument: Curette Bleeding: Minimum Hemostasis Achieved: Pressure End Time: 15:03 Procedural Pain: 0 Post Procedural Pain: 0 Response to Treatment: Procedure was tolerated well Level of Consciousness (Post- Awake and Alert procedure): Post Debridement Measurements of Total Wound Length: (cm) 6.2 Width: (cm) 2.5 Depth: (cm) 0.1 Volume: (cm) 1.217 Character of Wound/Ulcer Post Debridement: Requires Further Debridement Severity of Tissue Post Debridement: Fat layer exposed Post Procedure Diagnosis Same as Pre-procedure Electronic Signature(s) Signed: 07/17/2020 6:24:11 PM By: Wayne Pratt Signed: 07/17/2020 8:35:52 PM By: Wayne Pratt Entered By: Wayne Pratt on 07/17/2020 15:24:07 -------------------------------------------------------------------------------- HPI Details Patient Name: Date of Service: Wayne Pratt, Wayne RY W. 07/17/2020 2:15 PM Medical Record Number:  [id number] Patient Account Number: [id number] Date of Birth/Sex: Treating RN: 1960-02-26 (60 y.o. Wayne Pratt Primary Care Provider: Catalina Pratt Other Clinician: Referring Provider: Treating Provider/Extender: Wayne Pratt Weeks in Treatment: 1 History of Present Illness HPI Description: ADMISSION 07/04/2020 This is a 60 year old woman who presented to Dr. Carlis Abbott in January of this year having critical limb ischemia. First angiogram was on 01/31/2020 showed proximal profound disease in the SFA with right foot runoff via diseased posterior tibia. He underwent a right femoral endarterectomy with a right common femoral to below-knee popliteal bypass on 02/04/2020. There were generally good vascular results however the day after the surgery according to his wife he developed a black eschar at presumably ischemic spot on the right posterior calf. He also ultimately had amputations on 02/29/2020 of the right first second and fifth toes in March she had a wound VAC on the right calf but this was too painful and was switched to wet-to-dry. At 1 point this clearly had exposed muscle and the wife was able to document this by showing me short movie on his phone. He had an operative debridement on the right calf in early April with a skin substitute oMyriad. In any case none of this really made any difference. He was hospitalized early in June with bilateral pulmonary emboli and is now on Eliquis. Long medical problem list but predominantly type 2 diabetes with peripheral neuropathy, spinal stenosis. He is a smoker Most recent arterial studies were done on 03/18/2020 on the right this showed a an ABI in the posterior tibial of 1.05 with biphasic waveforms on the left noncompressible at 1.38 but with a great toe pressure near normal at 1.11 again with triphasic waveforms. 6/29; patient arrives back in clinic. We use Santyl on the posterior calf wound on the right. Arrives in clinic with  this looking a lot better. Healthy red granulated tissue over the majority of this. 7/7 the wound is cleaned up quite nicely I think it is  time to change from [facility] to [facility]. They are using a local wrap here. There is edema in his upper lower leg. Electronic Signature(s) Signed: 07/17/2020 8:35:52 PM By: Wayne Pratt Entered By: Wayne Pratt on 07/17/2020 15:25:01 -------------------------------------------------------------------------------- Physical Exam Details Patient Name: Date of Service: Wayne Pratt, Wayne RY W. 07/17/2020 2:15 PM Medical Record Number: [id number] Patient Account Number: [id number] Date of Birth/Sex: Treating RN: [birth year] (60 y.o. Wayne Pratt Primary Care Provider: Catalina Pratt Other Clinician: Referring Provider: Treating Provider/Extender: Wayne Pratt Weeks in Treatment: 1 Constitutional Sitting or standing Blood Pressure is within target range for patient.. Pulse regular and within target range for patient.[name] Kitchen Respirations regular, non-labored and within target range.. Temperature is normal and within the target range for the patient.[name] Kitchen Appears in no distress. Notes Wound exam; significant wound on the right posterior calf. Light debridement with a #5 curette. The condition of the wound surface is excellent. I am a bit concerned about the swelling above the wound vis--vis healing however for now we will try using Hydrofera Blue changing it at home Electronic Signature(s) Signed: 07/17/2020 8:35:52 PM By: Wayne Pratt Entered By: Wayne Pratt on 07/17/2020 15:26:09 -------------------------------------------------------------------------------- Physician Orders Details Patient Name: Date of Service: Wayne Pratt, Wayne RY W. 07/17/2020 2:15 PM Medical Record Number: [id number] Patient Account Number: [id number] Date of Birth/Sex: Treating RN: Aug 23, 1960 (60 y.o. 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 - Dr. Dellia Nims Return A Bathing/ Shower/ Hygiene May shower and wash wound with soap and water. Edema Control - Lymphedema / SCD / Other Elevate legs to the level of the heart or above for 30 minutes daily and/or when sitting, a frequency of: - 3-4 times per day Avoid standing for long periods of time. Exercise regularly Wound Treatment Wound #1 - Lower Leg Wound Laterality: Right, Posterior Cleanser: Normal Saline (DME) (Generic) Every Other Day/30 Days Discharge Instructions: Cleanse the wound with Normal Saline prior to applying a clean dressing using gauze sponges, not tissue or cotton balls. Cleanser: Soap and Water Every Other Day/30 Days Discharge Instructions: May shower and wash wound with dial antibacterial soap and water prior to dressing change. Prim Dressing: Hydrofera Blue Classic Foam, 4x4 in (DME) (Generic) Every Other Day/30 Days ary Discharge Instructions: Moisten with saline prior to applying to wound bed Secondary Dressing: Woven Gauze Sponge, Non-Sterile 4x4 in (Generic) Every Other Day/30 Days Discharge Instructions: Apply over primary dressing as directed. Secondary Dressing: ABD Pad, 5x9 (Generic) Every Other Day/30 Days Discharge Instructions: Apply over primary dressing as directed. Secured With: Coban Self-Adherent Wrap 4x5 (in/yd) (Generic) Every Other Day/30 Days Discharge Instructions: Secure with Coban not too tight Secured With: [employer], 4.5x3.1 (in/yd) (Generic) Every Other Day/30 Days Discharge Instructions: Secure with Kerlix as directed. Secured  With: Paper Tape, 2x10 (in/yd) (Generic) Every Other Day/30 Days Discharge Instructions: Secure dressing with tape as directed. Electronic Signature(s) Signed: 07/17/2020 6:24:11 PM By: Wayne Pratt Signed: 07/17/2020 8:35:52 PM By: Wayne Pratt Entered By: Wayne Pratt on 07/17/2020 15:02:11 -------------------------------------------------------------------------------- Problem List Details Patient Name: Date of Service: Wayne Pratt, Wayne RY W. 07/17/2020 2:15 PM Medical Record Number: [id number] Patient Account Number: [id number] Date of  Birth/Sex: Treating RN: 06/08/1960 (60 y.o. 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 07/17/2020 2:15 PM Medical Record Number: [id number] Patient Account Number: [id number] Date of Birth/Sex: Treating RN: 1960-11-22 (60 y.o. Wayne Pratt Primary Care Provider: Catalina Pratt Other Clinician: Referring Provider: Treating Provider/Extender: Wayne Pratt Weeks in Treatment: 1 Subjective History of Present Illness (HPI) ADMISSION 07/04/2020 This is a 60 year old woman who presented to Dr. Carlis Abbott in January of this year having critical limb ischemia. First angiogram was on 01/31/2020 showed proximal profound disease in  the SFA with right foot runoff via diseased posterior tibia. He underwent a right femoral endarterectomy with a right common femoral to below-knee popliteal bypass on 02/04/2020. There were generally good vascular results however the day after the surgery according to his wife he developed a black eschar at presumably ischemic spot on the right posterior calf. He also ultimately had amputations on 02/29/2020 of the right first second and fifth toes in March she had a wound VAC on the right calf but this was too painful and was switched to wet-to-dry. At 1 point this clearly had exposed muscle and the wife was able to document this by showing me short movie on his phone. He had an operative debridement on the right calf in early April with a skin substitute oMyriad. In any case none of this really made any difference. He was hospitalized early in June with bilateral pulmonary emboli and is now on Eliquis. Long medical problem list but predominantly type 2 diabetes with peripheral neuropathy, spinal stenosis. He is a smoker Most recent arterial studies were done on 03/18/2020 on the right this showed a an ABI in the posterior tibial of 1.05 with biphasic waveforms on the left noncompressible at 1.38 but with a great toe pressure near normal at 1.11 again with triphasic waveforms. 6/29; patient arrives back in clinic. We use Santyl on the posterior calf wound on the right. Arrives in clinic with this looking a lot better. Healthy red granulated tissue over the majority of this. 7/7 the wound is cleaned up quite nicely I think it is time to change from Santyl to [facility]. They are using a local wrap here. There is edema in his upper lower leg. Objective Constitutional Sitting or standing Blood Pressure is within target range for patient.. Pulse regular and within target range for patient.[name] Kitchen Respirations regular, non-labored and within target range.. Temperature is normal and within the target range for  the patient.[name] Kitchen Appears in no distress. Vitals Time Taken: 2:36 PM, Height: 73 in, Weight: 215 lbs, BMI: 28.4, Temperature: 98.6 F, Pulse: 108 bpm, Respiratory Rate: 18 breaths/min, Blood Pressure: 101/72 mmHg, Capillary Blood Glucose: 170 mg/dl. General Notes: Wound exam; significant wound on the right posterior calf. Light debridement with a #5 curette. The condition of the wound surface is excellent. I am a bit concerned about the swelling above the wound vis--vis healing however for now we will try using Hydrofera Blue changing it at home Integumentary (  Hair, Skin) Wound #1 status is Open. Original cause of wound was Surgical Injury. The date acquired was: 02/04/2020. The wound has been in treatment 1 weeks. The wound is located on the Right,Posterior Lower Leg. The wound measures 6.2cm length x 2.5cm width x 0.1cm depth; 12.174cm^2 area and 1.217cm^3 volume. There is Fat Layer (Subcutaneous Tissue) exposed. There is no tunneling noted. There is a medium amount of serosanguineous drainage noted. The wound margin is distinct with the outline attached to the wound base. There is medium (34-66%) red, pink granulation within the wound bed. There is a medium (34-66%) amount of necrotic tissue within the wound bed including Adherent Slough. Assessment Active Problems ICD-10 Type 2 diabetes mellitus with diabetic peripheral angiopathy without gangrene Non-pressure chronic ulcer of right calf with other specified severity Procedures Wound #1 Pre-procedure diagnosis of Wound #1 is a Diabetic Wound/Ulcer of the Lower Extremity located on the Right,Posterior Lower Leg .Severity of Tissue Pre Debridement is: Fat layer exposed. There was a Selective/Open Wound Non-Viable Tissue Debridement with a total area of 15.5 sq cm performed by Wayne Pratt. With the following instrument(s): Curette to remove Non-Viable tissue/material. Material removed includes [facility] after achieving pain control using  Lidocaine 4% T opical Solution. A time out was conducted at 15:00, prior to the start of the procedure. A Minimum amount of bleeding was controlled with Pressure. The procedure was tolerated well with a pain level of 0 throughout and a pain level of 0 following the procedure. Post Debridement Measurements: 6.2cm length x 2.5cm width x 0.1cm depth; 1.217cm^3 volume. Character of Wound/Ulcer Post Debridement requires further debridement. Severity of Tissue Post Debridement is: Fat layer exposed. Post procedure Diagnosis Wound #1: Same as Pre-Procedure Plan Follow-up Appointments: Return Appointment in 2 weeks. - Dr. Dellia Nims Bathing/ Shower/ Hygiene: May shower and wash wound with soap and water. Edema Control - Lymphedema / SCD / Other: Elevate legs to the level of the heart or above for 30 minutes daily and/or when sitting, a frequency of: - 3-4 times per day Avoid standing for long periods of time. Exercise regularly WOUND #1: - Lower Leg Wound Laterality: Right, Posterior Cleanser: Normal Saline (DME) (Generic) Every Other Day/30 Days Discharge Instructions: Cleanse the wound with Normal Saline prior to applying a clean dressing using gauze sponges, not tissue or cotton balls. Cleanser: Soap and Water Every Other Day/30 Days Discharge Instructions: May shower and wash wound with dial antibacterial soap and water prior to dressing change. Prim Dressing: Hydrofera Blue Classic Foam, 4x4 in (DME) (Generic) Every Other Day/30 Days ary Discharge Instructions: Moisten with saline prior to applying to wound bed Secondary Dressing: Woven Gauze Sponge, Non-Sterile 4x4 in (Generic) Every Other Day/30 Days Discharge Instructions: Apply over primary dressing as directed. Secondary Dressing: ABD Pad, 5x9 (Generic) Every Other Day/30 Days Discharge Instructions: Apply over primary dressing as directed. Secured With: Coban Self-Adherent Wrap 4x5 (in/yd) (Generic) Every Other Day/30 Days Discharge  Instructions: Secure with Coban not too tight Secured With: [employer], 4.5x3.1 (in/yd) (Generic) Every Other Day/30 Days Discharge Instructions: Secure with Kerlix as directed. Secured With: Paper T ape, 2x10 (in/yd) (Generic) Every Other Day/30 Days Discharge Instructions: Secure dressing with tape as directed. 1. Debrided with a #5 curette. Nice healthy looking granulation 2. Changed to [facility] Blue changed every second day 3. He may require compression wraps depending on how this does. Electronic Signature(s) Signed: 07/17/2020 8:35:52 PM By: Wayne Pratt Entered By: Wayne Pratt on 07/17/2020 15:27:20 -------------------------------------------------------------------------------- SuperBill  Details Patient Name: Date of Service: Wayne Pratt 07/17/2020 Medical Record Number: [id number] Patient Account Number: [id number] Date of Birth/Sex: Treating RN: 1960-05-10 (60 y.o. 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

## 2020-07-21 DIAGNOSIS — I2699 Other pulmonary embolism without acute cor pulmonale: Secondary | ICD-10-CM | POA: Diagnosis not present

## 2020-07-21 DIAGNOSIS — S81801A Unspecified open wound, right lower leg, initial encounter: Secondary | ICD-10-CM | POA: Diagnosis not present

## 2020-07-21 DIAGNOSIS — I714 Abdominal aortic aneurysm, without rupture: Secondary | ICD-10-CM | POA: Diagnosis not present

## 2020-07-22 NOTE — Progress Notes (Signed)
ABDIKADIR, FOHL ([id number]) Visit Report for 07/17/2020 Arrival Information Details Patient Name: Date of Service: Wayne Pratt 07/17/2020 2:15 PM Medical Record Number: [id number] Patient Account Number: [id number] Date of Birth/Sex: Treating RN: Oct 16, 1960 (60 y.o. Marcheta Grammes Primary Care Danijela Vessey: [name] Other Clinician: Referring Chalet Kerwin: Treating Xylan Sheils/Extender: Christel Mormon in Treatment: 1 Visit Information History Since Last Visit Added or deleted any medications: No Patient Arrived: Wheel Chair Any new allergies or adverse reactions: No Arrival Time: 14:31 Had a fall or experienced change in No Transfer Assistance: None activities of daily living that may affect Patient Identification Verified: Yes risk of falls: Secondary Verification Process Completed: Yes Signs or symptoms of abuse/neglect since last visito No Patient Requires Transmission-Based Precautions: No Hospitalized since last visit: No Patient Has Alerts: Yes Implantable device outside of the clinic excluding No Patient Alerts: Patient on Blood Thinner cellular tissue based products placed in the center ABI's: R:1.05 03/22 since last visit: Has Dressing in Place as Prescribed: Yes Pain Present Now: Yes Electronic Signature(s) Signed: 07/17/2020 5:53:02 PM By: Lorrin Jackson Entered By: Lorrin Jackson on 07/17/2020 14:34:00 -------------------------------------------------------------------------------- Encounter Discharge Information Details Patient Name: Date of Service: Wayne Pratt, Wayne RY W. 07/17/2020 2:15 PM Medical Record Number: [id number] Patient Account Number: [id number] Date of Birth/Sex: Treating RN: Oct 01, 1960 (59 y.o. [name] Primary Care Coy Vandoren: [name] Other Clinician: Referring Colen Eltzroth: Treating Tensley Wery/Extender: Christel Mormon in Treatment: 1 Encounter Discharge Information Items Post Procedure  Vitals Discharge Condition: Stable Temperature (F): 98.6 Ambulatory Status: Wheelchair Pulse (bpm): 108 Discharge Destination: Home Respiratory Rate (breaths/min): 18 Transportation: Private Auto Blood Pressure (mmHg): 101/72 Accompanied By: alone Schedule Follow-up Appointment: Yes Clinical Summary of Care: Patient Declined Electronic Signature(s) Signed: 07/22/2020 5:43:47 PM By: Levan Hurst RN, BSN Entered By: Levan Hurst on 07/17/2020 15:23:48 -------------------------------------------------------------------------------- Lower Extremity Assessment Details Patient Name: Date of Service: Wayne Pratt, Wayne RY W. 07/17/2020 2:15 PM Medical Record Number: [id number] Patient Account Number: [id number] Date of Birth/Sex: Treating RN: October 14, 1960 (60 y.o. Marcheta Grammes Primary Care Hadyn Azer: [name] Other Clinician: Referring Baylyn Sickles: Treating Janelle Spellman/Extender: Fredderick Severance Weeks in Treatment: 1 Edema Assessment Assessed: [Left: No] [Right: Yes] Edema: [Left: Ye] [Right: s] Calf Left: Right: Point of Measurement: 34 cm From Medial Instep 45 cm Ankle Left: Right: Point of Measurement: 9 cm From Medial Instep 26.5 cm Vascular Assessment Pulses: Dorsalis Pedis Palpable: [Right:Yes] Electronic Signature(s) Signed: 07/17/2020 5:53:02 PM By: Lorrin Jackson Entered By: Lorrin Jackson on 07/17/2020 14:43:09 -------------------------------------------------------------------------------- Multi Wound Chart Details Patient Name: Date of Service: Wayne Pratt, Wayne RY W. 07/17/2020 2:15 PM Medical Record Number: [id number] Patient Account Number: [id number] Date of Birth/Sex: Treating RN: 1960/05/07 (60 y.o. [name] Primary Care Alekai Pocock: [name] Other Clinician: Referring Jameah Rouser: Treating Maximilliano Kersh/Extender: Fredderick Severance Weeks in Treatment: 1 Vital Signs Height(in): 73 Capillary Blood Glucose(mg/dl): 170 Weight(lbs):  215 Pulse(bpm): 108 Body Mass Index(BMI): 28 Blood Pressure(mmHg): 101/72 Temperature(F): 98.6 Respiratory Rate(breaths/min): 18 Photos: [1:No Photos Right, Posterior Lower Leg] [N/A:N/A N/A] Wound Location: [1:Surgical Injury] [N/A:N/A] Wounding Event: [1:Diabetic Wound/Ulcer of the Lower] [N/A:N/A] Primary Etiology: [1:Extremity Anemia, Asthma, Sleep Apnea,] [N/A:N/A] Comorbid History: [1:Hypertension, Peripheral Arterial Disease, Type II Diabetes, Rheumatoid Arthritis, Neuropathy 02/04/2020] [N/A:N/A] Date Acquired: [1:1] [N/A:N/A] Weeks of Treatment: [1:Open] [N/A:N/A] Wound Status: [1:6.2x2.5x0.1] [N/A:N/A] Measurements L x W x D (cm) [1:12.174] [N/A:N/A] A (cm) : rea [1:1.217] [N/A:N/A] Volume (cm) : [1:91.70%] [N/A:N/A] % Reduction in A rea: [1:91.70%] [N/A:N/A] %  Reduction in Volume: [1:Grade 2] [N/A:N/A] Classification: [1:Medium] [N/A:N/A] Exudate A mount: [1:Serosanguineous] [N/A:N/A] Exudate Type: [1:red, brown] [N/A:N/A] Exudate Color: [1:Distinct, outline attached] [N/A:N/A] Wound Margin: [1:Medium (34-66%)] [N/A:N/A] Granulation A mount: [1:Red, Pink] [N/A:N/A] Granulation Quality: [1:Medium (34-66%)] [N/A:N/A] Necrotic A mount: [1:Fat Layer (Subcutaneous Tissue): Yes N/A] Exposed Structures: [1:Fascia: No Tendon: No Muscle: No Joint: No Bone: No Medium (34-66%)] [N/A:N/A] Epithelialization: [1:Debridement - Selective/Open Wound N/A] Debridement: Pre-procedure Verification/Time Out 15:00 [N/A:N/A] Taken: [1:Lidocaine 4% Topical Solution] [N/A:N/A] Pain Control: [1:Slough] [N/A:N/A] Tissue Debrided: [1:Non-Viable Tissue] [N/A:N/A] Level: [1:15.5] [N/A:N/A] Debridement A (sq cm): [1:rea Curette] [N/A:N/A] Instrument: [1:Minimum] [N/A:N/A] Bleeding: [1:Pressure] [N/A:N/A] Hemostasis A chieved: [1:0] [N/A:N/A] Procedural Pain: [1:0] [N/A:N/A] Post Procedural Pain: [1:Procedure was tolerated well] [N/A:N/A] Debridement Treatment Response: [1:6.2x2.5x0.1]  [N/A:N/A] Post Debridement Measurements L x W x D (cm) [1:1.217] [N/A:N/A] Post Debridement Volume: (cm) [1:Debridement] [N/A:N/A] Treatment Notes Wound #1 (Lower Leg) Wound Laterality: Right, Posterior Cleanser Normal Saline Discharge Instruction: Cleanse the wound with Normal Saline prior to applying a clean dressing using gauze sponges, not tissue or cotton balls. Soap and Water Discharge Instruction: May shower and wash wound with dial antibacterial soap and water prior to dressing change. Peri-Wound Care Topical Primary Dressing Hydrofera Blue Classic Foam, 4x4 in Discharge Instruction: Moisten with saline prior to applying to wound bed Secondary Dressing Woven Gauze Sponge, Non-Sterile 4x4 in Discharge Instruction: Apply over primary dressing as directed. ABD Pad, 5x9 Discharge Instruction: Apply over primary dressing as directed. Secured With [employer] 4x5 (in/yd) Discharge Instruction: Secure with Coban not too tight Kerlix Roll Sterile, 4.5x3.1 (in/yd) Discharge Instruction: Secure with Kerlix as directed. Paper Tape, 2x10 (in/yd) Discharge Instruction: Secure dressing with tape as directed. Compression Wrap Compression Stockings Add-Ons Electronic Signature(s) Signed: 07/17/2020 6:24:11 PM By: Deon Pilling Signed: 07/17/2020 8:35:52 PM By: [name] Entered By: Linton Ham on 07/17/2020 15:23:58 -------------------------------------------------------------------------------- Multi-Disciplinary Care Plan Details Patient Name: Date of Service: Wayne Pratt, Wayne RY W. 07/17/2020 2:15 PM Medical Record Number: [id number] Patient Account Number: [id number] Date of Birth/Sex: Treating RN: 1960/11/23 (60 y.o. [name] Primary Care Merlean Pizzini: [name] Other Clinician: Referring Mattalynn Crandle: Treating Lashon Beringer/Extender: Christel Mormon in Treatment: 1 Multidisciplinary Care Plan reviewed with physician Active  Inactive Nutrition Nursing Diagnoses: Impaired glucose control: actual or potential Potential for alteratiion in Nutrition/Potential for imbalanced nutrition Goals: Patient/caregiver will maintain therapeutic glucose control Date Initiated: 07/04/2020 Target Resolution Date: 08/01/2020 Goal Status: Active Interventions: Assess HgA1c results as ordered upon admission and as needed Provide education on elevated blood sugars and impact on wound healing Provide education on nutrition Treatment Activities: Patient referred to Primary Care Physician for further nutritional evaluation : 07/04/2020 Notes: Tissue Oxygenation Nursing Diagnoses: Actual ineffective tissue perfusion; peripheral (select once diagnosis is confirmed) Knowledge deficit related to disease process and management Goals: Patient/caregiver will verbalize understanding of disease process and disease management Date Initiated: 07/04/2020 Target Resolution Date: 08/01/2020 Goal Status: Active Interventions: Assess patient understanding of disease process and management upon diagnosis and as needed Assess peripheral arterial status upon admission and as needed Treatment Activities: Revascularization procedures : 07/04/2020 Notes: Wound/Skin Impairment Nursing Diagnoses: Impaired tissue integrity Knowledge deficit related to ulceration/compromised skin integrity Goals: Patient/caregiver will verbalize understanding of skin care regimen Date Initiated: 07/04/2020 Target Resolution Date: 08/01/2020 Goal Status: Active Ulcer/skin breakdown will have a volume reduction of 30% by week 4 Date Initiated: 07/04/2020 Target Resolution Date: 08/01/2020 Goal Status: Active Interventions: Assess patient/caregiver ability to obtain necessary supplies Assess patient/caregiver ability to  perform ulcer/skin care regimen upon admission and as needed Assess ulceration(s) every visit Provide education on ulcer and skin care Treatment  Activities: Skin care regimen initiated : 07/04/2020 Topical wound management initiated : 07/04/2020 Notes: Electronic Signature(s) Signed: 07/17/2020 6:24:11 PM By: Deon Pilling Entered By: Deon Pilling on 07/17/2020 14:59:46 -------------------------------------------------------------------------------- Pain Assessment Details Patient Name: Date of Service: Wayne Pratt, Wayne Hearing. 07/17/2020 2:15 PM Medical Record Number: [id number] Patient Account Number: [id number] Date of Birth/Sex: Treating RN: May 01, 1960 (60 y.o. Marcheta Grammes Primary Care Velta Rockholt: [name] Other Clinician: Referring Syenna Nazir: Treating Scot Shiraishi/Extender: Fredderick Severance Weeks in Treatment: 1 Active Problems Location of Pain Severity and Description of Pain Patient Has Paino Yes Site Locations Pain Location: Pain in Ulcers With Dressing Change: Yes Duration of the Pain. Constant / Intermittento Intermittent Rate the pain. Current Pain Level: 5 Character of Pain Describe the Pain: Tender, Throbbing Pain Management and Medication Current Pain Management: Medication: Yes Cold Application: No Rest: Yes Massage: No Activity: No T.E.N.S.: No Heat Application: No Leg drop or elevation: No Is the Current Pain Management Adequate: Adequate How does your wound impact your activities of daily livingo Sleep: No Bathing: No Appetite: No Relationship With Others: No Bladder Continence: No Emotions: No Bowel Continence: No Work: No Toileting: No Drive: No Dressing: No Hobbies: No Electronic Signature(s) Signed: 07/17/2020 5:53:02 PM By: Lorrin Jackson Entered By: Lorrin Jackson on 07/17/2020 14:34:34 -------------------------------------------------------------------------------- Patient/Caregiver Education Details Patient Name: Date of Service: Wayne Pratt, Wayne Hearing 7/7/2022andnbsp2:15 PM Medical Record Number: [id number] Patient Account Number: [id number] Date of  Birth/Gender: Treating RN: 05-03-1960 (60 y.o. [name] Primary Care Physician: [name] Other Clinician: Referring Physician: Treating Physician/Extender: Christel Mormon in Treatment: 1 Education Assessment Education Provided To: Patient Education Topics Provided Elevated Blood Sugar/ Impact on Healing: Handouts: Elevated Blood Sugars: How Do They Affect Wound Healing Methods: Explain/Verbal Responses: Reinforcements needed Electronic Signature(s) Signed: 07/17/2020 6:24:11 PM By: Deon Pilling Entered By: Deon Pilling on 07/17/2020 15:00:21 -------------------------------------------------------------------------------- Wound Assessment Details Patient Name: Date of Service: Wayne Pratt, Wayne Elon Spanner. 07/17/2020 2:15 PM Medical Record Number: [id number] Patient Account Number: [id number] Date of Birth/Sex: Treating RN: 02/01/1960 (60 y.o. Marcheta Grammes Primary Care Ansen Sayegh: [name] Other Clinician: Referring Johngabriel Verde: Treating Isham Smitherman/Extender: Fredderick Severance Weeks in Treatment: 1 Wound Status Wound Number: 1 Primary Diabetic Wound/Ulcer of the Lower Extremity Etiology: Wound Location: Right, Posterior Lower Leg Wound Open Wounding Event: Surgical Injury Status: Date Acquired: 02/04/2020 Comorbid Anemia, Asthma, Sleep Apnea, Hypertension, Peripheral Arterial Weeks Of Treatment: 1 History: Disease, Type II Diabetes, Rheumatoid Arthritis, Neuropathy Clustered Wound: No Photos Wound Measurements Length: (cm) 6.2 Width: (cm) 2.5 Depth: (cm) 0.1 Area: (cm) 12.174 Volume: (cm) 1.217 % Reduction in Area: 91.7% % Reduction in Volume: 91.7% Epithelialization: Medium (34-66%) Tunneling: No Wound Description Classification: Grade 2 Wound Margin: Distinct, outline attached Exudate Amount: Medium Exudate Type: Serosanguineous Exudate Color: red, brown Foul Odor After Cleansing: No Slough/Fibrino Yes Wound  Bed Granulation Amount: Medium (34-66%) Exposed Structure Granulation Quality: Red, Pink Fascia Exposed: No Necrotic Amount: Medium (34-66%) Fat Layer (Subcutaneous Tissue) Exposed: Yes Necrotic Quality: Adherent Slough Tendon Exposed: No Muscle Exposed: No Joint Exposed: No Bone Exposed: No Treatment Notes Wound #1 (Lower Leg) Wound Laterality: Right, Posterior Cleanser Normal Saline Discharge Instruction: Cleanse the wound with Normal Saline prior to applying a clean dressing using gauze sponges, not tissue or cotton balls. Soap and Water Discharge Instruction: May shower and wash wound with  dial antibacterial soap and water prior to dressing change. Peri-Wound Care Topical Primary Dressing Hydrofera Blue Classic Foam, 4x4 in Discharge Instruction: Moisten with saline prior to applying to wound bed Secondary Dressing Woven Gauze Sponge, Non-Sterile 4x4 in Discharge Instruction: Apply over primary dressing as directed. ABD Pad, 5x9 Discharge Instruction: Apply over primary dressing as directed. Secured With [employer] 4x5 (in/yd) Discharge Instruction: Secure with Coban not too tight Kerlix Roll Sterile, 4.5x3.1 (in/yd) Discharge Instruction: Secure with Kerlix as directed. Paper Tape, 2x10 (in/yd) Discharge Instruction: Secure dressing with tape as directed. Compression Wrap Compression Stockings Add-Ons Electronic Signature(s) Signed: 07/18/2020 5:16:50 PM By: Sandre Kitty Signed: 07/18/2020 5:49:30 PM By: Lorrin Jackson Previous Signature: 07/17/2020 5:53:02 PM Version By: Lorrin Jackson Entered By: Sandre Kitty on 07/18/2020 15:04:42 -------------------------------------------------------------------------------- Vitals Details Patient Name: Date of Service: Wayne Pratt, Wayne RY W. 07/17/2020 2:15 PM Medical Record Number: [id number] Patient Account Number: [id number] Date of Birth/Sex: Treating RN: April 20, 1960 (60 y.o. Marcheta Grammes Primary Care  Nur Krasinski: [name] Other Clinician: Referring Darcel Frane: Treating Eliya Bubar/Extender: Fredderick Severance Weeks in Treatment: 1 Vital Signs Time Taken: 14:36 Temperature (F): 98.6 Height (in): 73 Pulse (bpm): 108 Weight (lbs): 215 Respiratory Rate (breaths/min): 18 Body Mass Index (BMI): 28.4 Blood Pressure (mmHg): 101/72 Capillary Blood Glucose (mg/dl): 170 Reference Range: 80 - 120 mg / dl Electronic Signature(s) Signed: 07/17/2020 5:53:02 PM By: Lorrin Jackson Entered By: Lorrin Jackson on 07/17/2020 14:36:25

## 2020-07-24 DIAGNOSIS — M4722 Other spondylosis with radiculopathy, cervical region: Secondary | ICD-10-CM | POA: Diagnosis not present

## 2020-07-24 DIAGNOSIS — E1142 Type 2 diabetes mellitus with diabetic polyneuropathy: Secondary | ICD-10-CM | POA: Diagnosis not present

## 2020-07-24 DIAGNOSIS — I739 Peripheral vascular disease, unspecified: Secondary | ICD-10-CM | POA: Diagnosis not present

## 2020-07-24 DIAGNOSIS — Z79891 Long term (current) use of opiate analgesic: Secondary | ICD-10-CM | POA: Diagnosis not present

## 2020-07-31 ENCOUNTER — Other Ambulatory Visit: Payer: Self-pay

## 2020-07-31 ENCOUNTER — Encounter (HOSPITAL_BASED_OUTPATIENT_CLINIC_OR_DEPARTMENT_OTHER): Payer: PPO | Admitting: Internal Medicine

## 2020-07-31 DIAGNOSIS — T8189XA Other complications of procedures, not elsewhere classified, initial encounter: Secondary | ICD-10-CM | POA: Diagnosis not present

## 2020-07-31 DIAGNOSIS — E11622 Type 2 diabetes mellitus with other skin ulcer: Secondary | ICD-10-CM | POA: Diagnosis not present

## 2020-07-31 NOTE — Progress Notes (Signed)
DAJION, BICKFORD ([id number]) Visit Report for 07/31/2020 HPI Details Patient Name: Date of Service: Wayne Pratt. 07/31/2020 12:30 PM Medical Record Number: [id number] Patient Account Number: [id number] Date of Birth/Sex: Treating RN: 1960-05-29 (60 y.o. Wayne Pratt Primary Care Provider: [name] Other Clinician: Referring Provider: Treating Provider/Extender: Fredderick Severance Weeks in Treatment: 3 History of Present Illness HPI Description: ADMISSION 07/04/2020 This is a 60 year old woman who presented to Dr. Carlis Abbott in January of this year having critical limb ischemia. First angiogram was on 01/31/2020 showed proximal profound disease in the SFA with right foot runoff via diseased posterior tibia. He underwent a right femoral endarterectomy with a right common femoral to below-knee popliteal bypass on 02/04/2020. There were generally good vascular results however the day after the surgery according to his wife he developed a black eschar at presumably ischemic spot on the right posterior calf. He also ultimately had amputations on 02/29/2020 of the right first second and fifth toes in March she had a wound VAC on the right calf but this was too painful and was switched to wet-to-dry. At 1 point this clearly had exposed muscle and the wife was able to document this by showing me short movie on his phone. He had an operative debridement on the right calf in early April with a skin substitute oMyriad. In any case none of this really made any difference. He was hospitalized early in June with bilateral pulmonary emboli and is now on Eliquis. Long medical problem list but predominantly type 2 diabetes with peripheral neuropathy, spinal stenosis. He is a smoker Most recent arterial studies were done on 03/18/2020 on the right this showed a an ABI in the posterior tibial of 1.05 with biphasic waveforms on the left noncompressible at 1.38 but with a great toe pressure near normal  at 1.11 again with triphasic waveforms. 6/29; patient arrives back in clinic. We use Santyl on the posterior calf wound on the right. Arrives in clinic with this looking a lot better. Healthy red granulated tissue over the majority of this. 7/7 the wound is cleaned up quite nicely I think it is time to change from Santyl to [facility]. They are using a local wrap here. There is edema in his upper lower leg. 7/21; using Hydrofera Blue. Nice improvement in surface area they are changing this every 2d Electronic Signature(s) Signed: 07/31/2020 5:24:41 PM By: [name] Entered By: Linton Ham on 07/31/2020 13:18:00 -------------------------------------------------------------------------------- Physical Exam Details Patient Name: Date of Service: Wayne Pratt, [name] 07/31/2020 12:30 PM Medical Record Number: [id number] Patient Account Number: [id number] Date of Birth/Sex: Treating RN: 07/07/1960 (60 y.o. Wayne Pratt Primary Care Provider: [name] Other Clinician: Referring Provider: Treating Provider/Extender: Fredderick Severance Weeks in Treatment: 3 Constitutional Sitting or standing Blood Pressure is within target range for patient.. Pulse regular and within target range for patient.[name] Kitchen Respirations regular, non-labored and within target range.. Temperature is normal and within the target range for the patient.[name] Kitchen Appears in no distress. Notes Wound exam; significant wound on the right posterior calf. He has some slough in the middle of the wound that is adherent although we have had nice improvement in surface area with nice rims of epithelialization. No evidence of surrounding infection. Electronic Signature(s) Signed: 07/31/2020 5:24:41 PM By: [name] Entered By: Linton Ham on 07/31/2020 13:18:56 -------------------------------------------------------------------------------- Physician Orders Details Patient Name: Date of Service: Wayne Pratt, [name] 07/31/2020 12:30 PM Medical Record Number:  [id number] Patient Account Number: [id number] Date of Birth/Sex: Treating RN: 03-02-1960 (60 y.o. 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 - Dr. Dellia Nims 08/28/2020 Thursday Nurse Visit: - two weeks on the week of 08/14/2020 Thursday Bathing/ Shower/ Hygiene May shower and wash wound with soap and water. Edema Control - Lymphedema / SCD / Other Elevate legs to the level of the heart or above for 30 minutes daily and/or when sitting, a frequency of: - 3-4 times per day Avoid standing for long periods of time. Exercise regularly Wound Treatment Wound #1 - Lower Leg Wound Laterality: Right, Posterior Cleanser: Normal Saline (Generic) Every Other Day/30 Days Discharge Instructions: Cleanse the wound with Normal Saline prior to applying a clean dressing using gauze sponges, not tissue or cotton balls. Cleanser: Soap and Water Every Other Day/30 Days Discharge Instructions: May shower and wash wound with dial antibacterial soap and water prior to dressing change. Prim Dressing: Hydrofera Blue Classic Foam, 4x4 in (Generic) Every Other Day/30 Days ary Discharge Instructions: Moisten with saline prior to applying to wound bed Secondary Dressing: Woven Gauze Sponge, Non-Sterile 4x4 in (Generic) Every Other Day/30 Days Discharge Instructions: Apply over primary dressing as directed. Secondary Dressing: ABD Pad, 5x9 (Generic) Every Other Day/30 Days Discharge Instructions: Apply over primary dressing as directed. Secured  With: Coban Self-Adherent Wrap 4x5 (in/yd) (Generic) Every Other Day/30 Days Discharge Instructions: Secure with Coban not too tight Secured With: [employer], 4.5x3.1 (in/yd) (Generic) Every Other Day/30 Days Discharge Instructions: Secure with Kerlix as directed. Secured With: Paper Tape, 2x10 (in/yd) (Generic) Every Other Day/30 Days Discharge Instructions: Secure dressing with tape as directed. Electronic Signature(s) Signed: 07/31/2020 5:24:41 PM By: [name] Signed: 07/31/2020 6:31:47 PM By: Deon Pilling Entered By: Deon Pilling on 07/31/2020 13:12:30 -------------------------------------------------------------------------------- Problem List Details Patient Name: Date of Service: Wayne Pratt, [name] 07/31/2020 12:30 PM Medical Record Number: [id number] Patient Account Number: [id number] Date of Birth/Sex: Treating RN: [birth year] (60 y.o. 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 07/31/2020 12:30 PM Medical Record Number: [id number] Patient Account Number: [id number] Date of Birth/Sex: Treating RN: [birth year] (60 y.o. Wayne Pratt Primary Care Provider: [name] Other Clinician: Referring Provider: Treating Provider/Extender: Fredderick Severance Weeks in Treatment: 3 Subjective History of Present Illness (HPI) ADMISSION 07/04/2020 This is a 59 year old woman who presented to Dr. Carlis Abbott in January of this year having critical limb ischemia. First angiogram was on 01/31/2020 showed proximal profound disease in the SFA with right foot runoff via diseased posterior tibia. He underwent a right femoral endarterectomy with a right common femoral to below-knee popliteal bypass on 02/04/2020. There were generally good vascular results however the  day after the surgery according to his wife he developed a black eschar at presumably ischemic spot on the right posterior calf. He also ultimately had amputations on 02/29/2020 of the right first second and fifth toes in March she had a wound VAC on the right calf but this was too painful and was switched to wet-to-dry. At 1 point this clearly had exposed muscle and the wife was able to document this by showing me short movie on his phone. He had an operative debridement on the right calf in early April with a skin substitute oMyriad. In any case none of this really made any difference. He was hospitalized early in June with bilateral pulmonary emboli and is now on Eliquis. Long medical problem list but predominantly type 2 diabetes with peripheral neuropathy, spinal stenosis. He is a smoker Most recent arterial studies were done on 03/18/2020 on the right this showed a an ABI in the posterior tibial of 1.05 with biphasic waveforms on the left noncompressible at 1.38 but with a great toe pressure near normal at 1.11 again with triphasic waveforms. 6/29; patient arrives back in clinic. We use Santyl on the posterior calf wound on the right. Arrives in clinic with this looking a lot better. Healthy red granulated tissue over the majority of this. 7/7 the wound is cleaned up quite nicely I think it is time to change from Santyl to [facility]. They are using a local wrap here. There is  edema in his upper lower leg. 7/21; using Hydrofera Blue. Nice improvement in surface area they are changing this every 2d Objective Constitutional Sitting or standing Blood Pressure is within target range for patient.. Pulse regular and within target range for patient.[name] Kitchen Respirations regular, non-labored and within target range.. Temperature is normal and within the target range for the patient.[name] Kitchen Appears in no distress. Vitals Time Taken: 12:52 PM, Height: 73 in, Source: Stated, Weight: 215 lbs, Source: Stated, BMI: 28.4, Temperature: 98.0 F, Pulse: 93 bpm, Respiratory Rate: 18 breaths/min, Blood Pressure: 139/81 mmHg, Capillary Blood Glucose: 151 mg/dl. General Notes: glucose per pt report this am General Notes: Wound exam; significant wound on the right posterior calf. He has some slough in the middle of the wound that is adherent although we have had nice improvement in surface area with nice rims of epithelialization. No evidence of surrounding infection. Integumentary (Hair, Skin) Wound #1 status is Open. Original cause of wound was Surgical Injury. The date acquired was: 02/04/2020. The wound has been in treatment 3 weeks. The wound is located on the Right,Posterior Lower Leg. The wound measures 3.5cm length x 2.6cm width x 0.1cm depth; 7.147cm^2 area and 0.715cm^3 volume. There is Fat Layer (Subcutaneous Tissue) exposed. There is no tunneling or undermining noted. There is a medium amount of serosanguineous drainage noted. The wound margin is distinct with the outline attached to the wound base. There is medium (34-66%) red, pink granulation within the wound bed. There is a medium (34-66%) amount of necrotic tissue within the wound bed including Adherent Slough. Assessment Active Problems ICD-10 Type 2 diabetes mellitus with diabetic peripheral angiopathy without gangrene Non-pressure chronic ulcer of right calf with other specified severity Plan Follow-up Appointments: Return  appointment in 1 month. - Dr. Dellia Nims 08/28/2020 Thursday Nurse Visit: - two weeks on the week of 08/14/2020 Thursday Bathing/ Shower/ Hygiene: May shower and wash wound with soap and water. Edema Control - Lymphedema / SCD / Other: Elevate legs to the level of the heart or above for 30  minutes daily and/or when sitting, a frequency of: - 3-4 times per day Avoid standing for long periods of time. Exercise regularly WOUND #1: - Lower Leg Wound Laterality: Right, Posterior Cleanser: Normal Saline (Generic) Every Other Day/30 Days Discharge Instructions: Cleanse the wound with Normal Saline prior to applying a clean dressing using gauze sponges, not tissue or cotton balls. Cleanser: Soap and Water Every Other Day/30 Days Discharge Instructions: May shower and wash wound with dial antibacterial soap and water prior to dressing change. Prim Dressing: Hydrofera Blue Classic Foam, 4x4 in (Generic) Every Other Day/30 Days ary Discharge Instructions: Moisten with saline prior to applying to wound bed Secondary Dressing: Woven Gauze Sponge, Non-Sterile 4x4 in (Generic) Every Other Day/30 Days Discharge Instructions: Apply over primary dressing as directed. Secondary Dressing: ABD Pad, 5x9 (Generic) Every Other Day/30 Days Discharge Instructions: Apply over primary dressing as directed. Secured With: Coban Self-Adherent Wrap 4x5 (in/yd) (Generic) Every Other Day/30 Days Discharge Instructions: Secure with Coban not too tight Secured With: [employer], 4.5x3.1 (in/yd) (Generic) Every Other Day/30 Days Discharge Instructions: Secure with Kerlix as directed. Secured With: Paper T ape, 2x10 (in/yd) (Generic) Every Other Day/30 Days Discharge Instructions: Secure dressing with tape as directed. 1. Continue with Hydrofera Blue/ABD/Coban 2. I have wondered about having to put his leg in compression however the improvement in surface area dictates that this is probably not necessary. 3. He has some  debris in the center of this wound although I did not feel it was necessary to debride this today he is already making nice changes in surface area. If we get to a point where there is wound stalls then he may need further debridement Electronic Signature(s) Signed: 07/31/2020 5:24:41 PM By: [name] Entered By: Linton Ham on 07/31/2020 13:20:13 -------------------------------------------------------------------------------- SuperBill Details Patient Name: Date of Service: Wayne Pratt, [name] 07/31/2020 Medical Record Number: [id number] Patient Account Number: [id number] Date of Birth/Sex: Treating RN: 02/27/1960 (60 y.o. 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

## 2020-07-31 NOTE — Progress Notes (Signed)
Wayne Pratt, HICKAM ([id number]) Visit Report for 07/31/2020 Arrival Information Details Patient Name: Date of Service: Wayne Pratt. 07/31/2020 12:30 PM Medical Record Number: [id number] Patient Account Number: [id number] Date of Birth/Sex: Treating RN: [birth year] (60 y.o. [name] Primary Care Maxime Beckner: [name] Other Clinician: Referring Clydette Privitera: Treating Jebediah Macrae/Extender: Christel Mormon in Treatment: 3 Visit Information History Since Last Visit Added or deleted any medications: No Patient Arrived: Ambulatory Any new allergies or adverse reactions: No Arrival Time: 12:52 Had a fall or experienced change in No Accompanied By: spouse activities of daily living that may affect Transfer Assistance: None risk of falls: Patient Identification Verified: Yes Signs or symptoms of abuse/neglect since last visito No Secondary Verification Process Completed: Yes Hospitalized since last visit: No Patient Requires Transmission-Based Precautions: No Implantable device outside of the clinic excluding No Patient Has Alerts: Yes cellular tissue based products placed in the center Patient Alerts: Patient on Blood Thinner since last visit: ABI's: R:1.05 03/22 Has Dressing in Place as Prescribed: Yes Pain Present Now: Yes Electronic Signature(s) Signed: 07/31/2020 6:07:52 PM By: Baruch Gouty RN, BSN Entered By: Baruch Gouty on 07/31/2020 12:52:30 -------------------------------------------------------------------------------- Clinic Level of Care Assessment Details Patient Name: Date of Service: Wayne Pratt, [facility] Hearing. 07/31/2020 12:30 PM Medical Record Number: [id number] Patient Account Number: [id number] Date of Birth/Sex: Treating RN: [birth year] (60 y.o. Wayne Pratt Primary Care Rajiv Parlato: [name] Other Clinician: Referring Sherronda Sweigert: Treating Lisa-Marie Rueger/Extender: Christel Mormon in Treatment: 3 Clinic Level of Care  Assessment Items TOOL 4 Quantity Score X- 1 0 Use when only an EandM is performed on FOLLOW-UP visit ASSESSMENTS - Nursing Assessment / Reassessment X- 1 10 Reassessment of Co-morbidities (includes updates in patient status) X- 1 5 Reassessment of Adherence to Treatment Plan ASSESSMENTS - Wound and Skin A ssessment / Reassessment X - Simple Wound Assessment / Reassessment - one wound 1 5 []  - 0 Complex Wound Assessment / Reassessment - multiple wounds X- 1 10 Dermatologic / Skin Assessment (not related to wound area) ASSESSMENTS - Focused Assessment X- 1 5 Circumferential Edema Measurements - multi extremities X- 1 10 Nutritional Assessment / Counseling / Intervention []  - 0 Lower Extremity Assessment (monofilament, tuning fork, pulses) []  - 0 Peripheral Arterial Disease Assessment (using hand held doppler) ASSESSMENTS - Ostomy and/or Continence Assessment and Care []  - 0 Incontinence Assessment and Management []  - 0 Ostomy Care Assessment and Management (repouching, etc.) PROCESS - Coordination of Care X - Simple Patient / Family Education for ongoing care 1 15 []  - 0 Complex (extensive) Patient / Family Education for ongoing care X- 1 10 Staff obtains Programmer, systems, Records, T Results / Process Orders est []  - 0 Staff telephones HHA, Nursing Homes / Clarify orders / etc []  - 0 Routine Transfer to another Facility (non-emergent condition) []  - 0 Routine Hospital Admission (non-emergent condition) []  - 0 New Admissions / Biomedical engineer / Ordering NPWT Apligraf, etc. , []  - 0 Emergency Hospital Admission (emergent condition) X- 1 10 Simple Discharge Coordination []  - 0 Complex (extensive) Discharge Coordination PROCESS - Special Needs []  - 0 Pediatric / Minor Patient Management []  - 0 Isolation Patient Management []  - 0 Hearing / Language / Visual special needs []  - 0 Assessment of Community assistance (transportation, D/C planning, etc.) []  -  0 Additional assistance / Altered mentation []  - 0 Support Surface(s) Assessment (bed, cushion, seat, etc.) INTERVENTIONS - Wound Cleansing / Measurement X - Simple Wound Cleansing - one wound  1 5 []  - 0 Complex Wound Cleansing - multiple wounds X- 1 5 Wound Imaging (photographs - any number of wounds) []  - 0 Wound Tracing (instead of photographs) X- 1 5 Simple Wound Measurement - one wound []  - 0 Complex Wound Measurement - multiple wounds INTERVENTIONS - Wound Dressings X - Small Wound Dressing one or multiple wounds 1 10 []  - 0 Medium Wound Dressing one or multiple wounds []  - 0 Large Wound Dressing one or multiple wounds []  - 0 Application of Medications - topical []  - 0 Application of Medications - injection INTERVENTIONS - Miscellaneous []  - 0 External ear exam []  - 0 Specimen Collection (cultures, biopsies, blood, body fluids, etc.) []  - 0 Specimen(s) / Culture(s) sent or taken to Lab for analysis []  - 0 Patient Transfer (multiple staff / Civil Service fast streamer / Similar devices) []  - 0 Simple Staple / Suture removal (25 or less) []  - 0 Complex Staple / Suture removal (26 or more) []  - 0 Hypo / Hyperglycemic Management (close monitor of Blood Glucose) []  - 0 Ankle / Brachial Index (ABI) - do not check if billed separately X- 1 5 Vital Signs Has the patient been seen at the hospital within the last three years: Yes Total Score: 110 Level Of Care: New/Established - Level 3 Electronic Signature(s) Signed: 07/31/2020 6:31:47 PM By: Deon Pilling Entered By: Deon Pilling on 07/31/2020 15:39:58 -------------------------------------------------------------------------------- Encounter Discharge Information Details Patient Name: Date of Service: Wayne Pratt, [name] 07/31/2020 12:30 PM Medical Record Number: [id number] Patient Account Number: [id number] Date of Birth/Sex: Treating RN: [birth year] (60 y.o. Wayne Pratt Primary Care Annissa Andreoni: [name] Other  Clinician: Referring Jakalyn Kratky: Treating Feven Alderfer/Extender: Christel Mormon in Treatment: 3 Encounter Discharge Information Items Discharge Condition: Stable Ambulatory Status: Ambulatory Discharge Destination: Home Transportation: Private Auto Accompanied By: wife Schedule Follow-up Appointment: Yes Clinical Summary of Care: Electronic Signature(s) Signed: 07/31/2020 6:31:47 PM By: Deon Pilling Entered By: Deon Pilling on 07/31/2020 15:40:29 -------------------------------------------------------------------------------- Lower Extremity Assessment Details Patient Name: Date of Service: Wayne Pratt. 07/31/2020 12:30 PM Medical Record Number: [id number] Patient Account Number: [id number] Date of Birth/Sex: Treating RN: 05-30-1960 (60 y.o. [name] Primary Care Kalisi Bevill: [name] Other Clinician: Referring Beyonca Wisz: Treating Habiba Treloar/Extender: Fredderick Severance Weeks in Treatment: 3 Edema Assessment Assessed: [Left: No] [Right: No] Edema: [Left: Ye] [Right: s] Calf Left: Right: Point of Measurement: 34 cm From Medial Instep 43.2 cm Ankle Left: Right: Point of Measurement: 9 cm From Medial Instep 26 cm Vascular Assessment Pulses: Dorsalis Pedis Palpable: [Right:Yes] Electronic Signature(s) Signed: 07/31/2020 6:07:52 PM By: Baruch Gouty RN, BSN Entered By: Baruch Gouty on 07/31/2020 12:58:55 -------------------------------------------------------------------------------- Multi Wound Chart Details Patient Name: Date of Service: Wayne Pratt, [name] 07/31/2020 12:30 PM Medical Record Number: [id number] Patient Account Number: [id number] Date of Birth/Sex: Treating RN: 1961/01/06 (60 y.o. Wayne Pratt Primary Care Amillion Scobee: [name] Other Clinician: Referring Tayra Dawe: Treating Jerad Dunlap/Extender: Fredderick Severance Weeks in Treatment: 3 Vital Signs Height(in): 73 Capillary Blood  Glucose(mg/dl): 151 Weight(lbs): 215 Pulse(bpm): 93 Body Mass Index(BMI): 28 Blood Pressure(mmHg): 139/81 Temperature(F): 98.0 Respiratory Rate(breaths/min): 18 Photos: [1:No Photos Right, Posterior Lower Leg] [N/A:N/A N/A] Wound Location: [1:Surgical Injury] [N/A:N/A] Wounding Event: [1:Diabetic Wound/Ulcer of the Lower] [N/A:N/A] Primary Etiology: [1:Extremity Anemia, Asthma, Sleep Apnea,] [N/A:N/A] Comorbid History: [1:Hypertension, Peripheral Arterial Disease, Type II Diabetes, Rheumatoid Arthritis, Neuropathy 02/04/2020] [N/A:N/A] Date Acquired: [1:3] [N/A:N/A] Weeks of Treatment: [1:Open] [N/A:N/A] Wound Status: [1:3.5x2.6x0.1] [N/A:N/A] Measurements L x W  x D (cm) [1:7.147] [N/A:N/A] A (cm) : rea [1:0.715] [N/A:N/A] Volume (cm) : [1:95.10%] [N/A:N/A] % Reduction in A rea: [1:95.10%] [N/A:N/A] % Reduction in Volume: [1:Grade 2] [N/A:N/A] Classification: [1:Medium] [N/A:N/A] Exudate A mount: [1:Serosanguineous] [N/A:N/A] Exudate Type: [1:red, brown] [N/A:N/A] Exudate Color: [1:Distinct, outline attached] [N/A:N/A] Wound Margin: [1:Medium (34-66%)] [N/A:N/A] Granulation A mount: [1:Red, Pink] [N/A:N/A] Granulation Quality: [1:Medium (34-66%)] [N/A:N/A] Necrotic A mount: [1:Fat Layer (Subcutaneous Tissue): Yes N/A] Exposed Structures: [1:Fascia: No Tendon: No Muscle: No Joint: No Bone: No Small (1-33%)] [N/A:N/A] Treatment Notes Electronic Signature(s) Signed: 07/31/2020 5:24:41 PM By: [name] Signed: 07/31/2020 6:31:47 PM By: Deon Pilling Entered By: Linton Ham on 07/31/2020 13:17:35 -------------------------------------------------------------------------------- Multi-Disciplinary Care Plan Details Patient Name: Date of Service: Wayne Pratt, [name] 07/31/2020 12:30 PM Medical Record Number: [id number] Patient Account Number: [id number] Date of Birth/Sex: Treating RN: August 13, 1960 (60 y.o. Wayne Pratt Primary Care [place] Maiorino: [name] Other  Clinician: Referring Jaylia Pettus: Treating Ikeya Brockel/Extender: Christel Mormon in Treatment: 3 Multidisciplinary Care Plan reviewed with physician Active Inactive Nutrition Nursing Diagnoses: Impaired glucose control: actual or potential Potential for alteratiion in Nutrition/Potential for imbalanced nutrition Goals: Patient/caregiver will maintain therapeutic glucose control Date Initiated: 07/04/2020 Target Resolution Date: 09/04/2020 Goal Status: Active Interventions: Assess HgA1c results as ordered upon admission and as needed Provide education on elevated blood sugars and impact on wound healing Provide education on nutrition Treatment Activities: Patient referred to Primary Care Physician for further nutritional evaluation : 07/04/2020 Notes: Tissue Oxygenation Nursing Diagnoses: Actual ineffective tissue perfusion; peripheral (select once diagnosis is confirmed) Knowledge deficit related to disease process and management Goals: Patient/caregiver will verbalize understanding of disease process and disease management Date Initiated: 07/04/2020 Target Resolution Date: 09/10/2020 Goal Status: Active Interventions: Assess patient understanding of disease process and management upon diagnosis and as needed Assess peripheral arterial status upon admission and as needed Treatment Activities: Revascularization procedures : 07/04/2020 Notes: Wound/Skin Impairment Nursing Diagnoses: Impaired tissue integrity Knowledge deficit related to ulceration/compromised skin integrity Goals: Patient/caregiver will verbalize understanding of skin care regimen Date Initiated: 07/04/2020 Target Resolution Date: 09/10/2020 Goal Status: Active Ulcer/skin breakdown will have a volume reduction of 30% by week 4 Date Initiated: 07/04/2020 Target Resolution Date: 08/08/2020 Goal Status: Active Interventions: Assess patient/caregiver ability to obtain necessary supplies Assess  patient/caregiver ability to perform ulcer/skin care regimen upon admission and as needed Assess ulceration(s) every visit Provide education on ulcer and skin care Treatment Activities: Skin care regimen initiated : 07/04/2020 Topical wound management initiated : 07/04/2020 Notes: Electronic Signature(s) Signed: 07/31/2020 6:31:47 PM By: Deon Pilling Entered By: Deon Pilling on 07/31/2020 12:46:51 -------------------------------------------------------------------------------- Pain Assessment Details Patient Name: Date of Service: Wayne Pratt, [facility] Hearing. 07/31/2020 12:30 PM Medical Record Number: [id number] Patient Account Number: [id number] Date of Birth/Sex: Treating RN: [birth year] (60 y.o. [name] Primary Care Hussien Greenblatt: [name] Other Clinician: Referring Hilmar Moldovan: Treating Damaree Sargent/Extender: Fredderick Severance Weeks in Treatment: 3 Active Problems Location of Pain Severity and Description of Pain Patient Has Paino Yes Site Locations Pain Location: Pain in Ulcers Rate the pain. Current Pain Level: 0 Worst Pain Level: 8 Least Pain Level: 0 Character of Pain Describe the Pain: Sharp, Shooting Pain Management and Medication Current Pain Management: Medication: Yes Is the Current Pain Management Adequate: Adequate How does your wound impact your activities of daily livingo Sleep: No Bathing: No Appetite: No Relationship With Others: No Bladder Continence: No Emotions: No Bowel Continence: No Work: No Toileting: No Drive: No Dressing: No Hobbies: No  Electronic Signature(s) Signed: 07/31/2020 6:07:52 PM By: Baruch Gouty RN, BSN Entered By: Baruch Gouty on 07/31/2020 12:57:13 -------------------------------------------------------------------------------- Patient/Caregiver Education Details Patient Name: Date of Service: Wayne Pratt, [facility] Hearing 7/21/2022andnbsp12:30 PM Medical Record Number: [id number] Patient Account Number: [id number] Date  of Birth/Gender: Treating RN: 1960-10-19 (60 y.o. Wayne Pratt Primary Care Physician: [name] Other Clinician: Referring Physician: Treating Physician/Extender: Christel Mormon in Treatment: 3 Education Assessment Education Provided To: Patient Education Topics Provided Elevated Blood Sugar/ Impact on Healing: Handouts: Elevated Blood Sugars: How Do They Affect Wound Healing Methods: Explain/Verbal Responses: Reinforcements needed Electronic Signature(s) Signed: 07/31/2020 6:31:47 PM By: Deon Pilling Entered By: Deon Pilling on 07/31/2020 12:47:38 -------------------------------------------------------------------------------- Wound Assessment Details Patient Name: Date of Service: Wayne Pratt, [facility] Hearing. 07/31/2020 12:30 PM Medical Record Number: [id number] Patient Account Number: [id number] Date of Birth/Sex: Treating RN: 05-17-1960 (60 y.o. [name] Primary Care Tomeshia Pizzi: [name] Other Clinician: Referring Ronella Plunk: Treating Paxtyn Wisdom/Extender: Fredderick Severance Weeks in Treatment: 3 Wound Status Wound Number: 1 Primary Diabetic Wound/Ulcer of the Lower Extremity Etiology: Wound Location: Right, Posterior Lower Leg Wound Open Wounding Event: Surgical Injury Status: Date Acquired: 02/04/2020 Comorbid Anemia, Asthma, Sleep Apnea, Hypertension, Peripheral Arterial Weeks Of Treatment: 3 History: Disease, Type II Diabetes, Rheumatoid Arthritis, Neuropathy Clustered Wound: No Wound Measurements Length: (cm) 3.5 Width: (cm) 2.6 Depth: (cm) 0.1 Area: (cm) 7.147 Volume: (cm) 0.715 % Reduction in Area: 95.1% % Reduction in Volume: 95.1% Epithelialization: Small (1-33%) Tunneling: No Undermining: No Wound Description Classification: Grade 2 Wound Margin: Distinct, outline attached Exudate Amount: Medium Exudate Type: Serosanguineous Exudate Color: red, brown Foul Odor After Cleansing: No Slough/Fibrino  Yes Wound Bed Granulation Amount: Medium (34-66%) Exposed Structure Granulation Quality: Red, Pink Fascia Exposed: No Necrotic Amount: Medium (34-66%) Fat Layer (Subcutaneous Tissue) Exposed: Yes Necrotic Quality: Adherent Slough Tendon Exposed: No Muscle Exposed: No Joint Exposed: No Bone Exposed: No Treatment Notes Wound #1 (Lower Leg) Wound Laterality: Right, Posterior Cleanser Normal Saline Discharge Instruction: Cleanse the wound with Normal Saline prior to applying a clean dressing using gauze sponges, not tissue or cotton balls. Soap and Water Discharge Instruction: May shower and wash wound with dial antibacterial soap and water prior to dressing change. Peri-Wound Care Topical Primary Dressing Hydrofera Blue Classic Foam, 4x4 in Discharge Instruction: Moisten with saline prior to applying to wound bed Secondary Dressing Woven Gauze Sponge, Non-Sterile 4x4 in Discharge Instruction: Apply over primary dressing as directed. ABD Pad, 5x9 Discharge Instruction: Apply over primary dressing as directed. Secured With [employer] 4x5 (in/yd) Discharge Instruction: Secure with Coban not too tight Kerlix Roll Sterile, 4.5x3.1 (in/yd) Discharge Instruction: Secure with Kerlix as directed. Paper Tape, 2x10 (in/yd) Discharge Instruction: Secure dressing with tape as directed. Compression Wrap Compression Stockings Add-Ons Electronic Signature(s) Signed: 07/31/2020 6:07:52 PM By: Baruch Gouty RN, BSN Entered By: Baruch Gouty on 07/31/2020 13:00:17 -------------------------------------------------------------------------------- Vitals Details Patient Name: Date of Service: Wayne Pratt, [name] 07/31/2020 12:30 PM Medical Record Number: [id number] Patient Account Number: [id number] Date of Birth/Sex: Treating RN: [birth year] (60 y.o. [name] Primary Care Zanyia Silbaugh: [name] Other Clinician: Referring Cliffard Hair: Treating Braxley Balandran/Extender:  Fredderick Severance Weeks in Treatment: 3 Vital Signs Time Taken: 12:52 Temperature (F): 98.0 Height (in): 73 Pulse (bpm): 93 Source: Stated Respiratory Rate (breaths/min): 18 Weight (lbs): 215 Blood Pressure (mmHg): 139/81 Source: Stated Capillary Blood Glucose (mg/dl): 151 Body Mass Index (BMI): 28.4 Reference Range: 80 - 120 mg / dl Notes glucose per  pt report this am Electronic Signature(s) Signed: 07/31/2020 6:07:52 PM By: Baruch Gouty RN, BSN Entered By: Baruch Gouty on 07/31/2020 12:53:06

## 2020-08-14 ENCOUNTER — Encounter (HOSPITAL_BASED_OUTPATIENT_CLINIC_OR_DEPARTMENT_OTHER): Payer: PPO | Attending: Internal Medicine | Admitting: Internal Medicine

## 2020-08-14 ENCOUNTER — Other Ambulatory Visit: Payer: Self-pay

## 2020-08-14 DIAGNOSIS — F17200 Nicotine dependence, unspecified, uncomplicated: Secondary | ICD-10-CM | POA: Insufficient documentation

## 2020-08-14 DIAGNOSIS — E1151 Type 2 diabetes mellitus with diabetic peripheral angiopathy without gangrene: Secondary | ICD-10-CM | POA: Insufficient documentation

## 2020-08-14 DIAGNOSIS — E11622 Type 2 diabetes mellitus with other skin ulcer: Secondary | ICD-10-CM | POA: Diagnosis not present

## 2020-08-14 DIAGNOSIS — M069 Rheumatoid arthritis, unspecified: Secondary | ICD-10-CM | POA: Diagnosis not present

## 2020-08-14 DIAGNOSIS — E1142 Type 2 diabetes mellitus with diabetic polyneuropathy: Secondary | ICD-10-CM | POA: Diagnosis not present

## 2020-08-14 DIAGNOSIS — Z86711 Personal history of pulmonary embolism: Secondary | ICD-10-CM | POA: Diagnosis not present

## 2020-08-14 DIAGNOSIS — L97219 Non-pressure chronic ulcer of right calf with unspecified severity: Secondary | ICD-10-CM | POA: Insufficient documentation

## 2020-08-19 NOTE — Progress Notes (Signed)
KIAI, PEACE ([id number]) Visit Report for 08/14/2020 Arrival Information Details Patient Name: Date of Service: Wayne Pratt 08/14/2020 12:45 PM Medical Record Number: [id number] Patient Account Number: [id number] Date of Birth/Sex: Treating RN: 26-Mar-1960 (60 y.o. Wayne Pratt, Wayne Primary Care Wayne Pratt: Wayne Pratt Other Clinician: Referring Wayne Pratt: Treating Wayne Pratt in Treatment: 5 Visit Information History Since Last Visit Added or deleted any medications: No Patient Arrived: Ambulatory Any new allergies or adverse reactions: No Arrival Time: 13:17 Had a fall or experienced change in No Accompanied By: wife activities of daily living that may affect Transfer Assistance: None risk of falls: Patient Identification Verified: Yes Signs or symptoms of abuse/neglect since last visito No Secondary Verification Process Completed: Yes Hospitalized since last visit: No Patient Requires Transmission-Based Precautions: No Implantable device outside of the clinic excluding No Patient Has Alerts: Yes cellular tissue based products placed in the center Patient Alerts: Patient on Blood Thinner since last visit: ABI's: R:1.05 03/22 Has Dressing in Place as Prescribed: No Pain Present Now: No Electronic Signature(s) Signed: 08/19/2020 5:35:30 PM By: Wayne Hammock RN Entered By: Wayne Pratt on 08/14/2020 13:17:52 -------------------------------------------------------------------------------- Clinic Level of Care Assessment Details Patient Name: Date of Service: Wayne Pratt. 08/14/2020 12:45 PM Medical Record Number: [id number] Patient Account Number: [id number] Date of Birth/Sex: Treating RN: December 12, 1960 (60 y.o. Wayne Pratt, Wayne Primary Care Wayne Pratt: Wayne Pratt Other Clinician: Referring Wayne Pratt: Treating Wayne Pratt/Extender: Wayne Pratt in Treatment: 5 Clinic Level of Care  Assessment Items TOOL 4 Quantity Score X- 1 0 Use when only an EandM is performed on FOLLOW-UP visit ASSESSMENTS - Nursing Assessment / Reassessment X- 1 10 Reassessment of Co-morbidities (includes updates in patient status) X- 1 5 Reassessment of Adherence to Treatment Plan ASSESSMENTS - Wound and Skin A ssessment / Reassessment X - Simple Wound Assessment / Reassessment - one wound 1 5 '[]'$  - 0 Complex Wound Assessment / Reassessment - multiple wounds X- 1 10 Dermatologic / Skin Assessment (not related to wound area) ASSESSMENTS - Focused Assessment '[]'$  - 0 Circumferential Edema Measurements - multi extremities '[]'$  - 0 Nutritional Assessment / Counseling / Intervention '[]'$  - 0 Lower Extremity Assessment (monofilament, tuning fork, pulses) '[]'$  - 0 Peripheral Arterial Disease Assessment (using hand held doppler) ASSESSMENTS - Ostomy and/or Continence Assessment and Care '[]'$  - 0 Incontinence Assessment and Management '[]'$  - 0 Ostomy Care Assessment and Management (repouching, etc.) PROCESS - Coordination of Care X - Simple Patient / Family Education for ongoing care 1 15 '[]'$  - 0 Complex (extensive) Patient / Family Education for ongoing care X- 1 10 Staff obtains Programmer, systems, Records, T Results / Process Orders est X- 1 10 Staff telephones HHA, Nursing Homes / Clarify orders / etc '[]'$  - 0 Routine Transfer to another Facility (non-emergent condition) '[]'$  - 0 Routine Hospital Admission (non-emergent condition) '[]'$  - 0 New Admissions / Biomedical engineer / Ordering NPWT Apligraf, etc. , '[]'$  - 0 Emergency Hospital Admission (emergent condition) X- 1 10 Simple Discharge Coordination '[]'$  - 0 Complex (extensive) Discharge Coordination PROCESS - Special Needs '[]'$  - 0 Pediatric / Minor Patient Management '[]'$  - 0 Isolation Patient Management '[]'$  - 0 Hearing / Language / Visual special needs '[]'$  - 0 Assessment of Community assistance (transportation, D/C planning, etc.) '[]'$  -  0 Additional assistance / Altered mentation '[]'$  - 0 Support Surface(s) Assessment (bed, cushion, seat, etc.) INTERVENTIONS - Wound Cleansing / Measurement X - Simple Wound Cleansing - one wound 1  5 '[]'$  - 0 Complex Wound Cleansing - multiple wounds X- 1 5 Wound Imaging (photographs - any number of wounds) '[]'$  - 0 Wound Tracing (instead of photographs) X- 1 5 Simple Wound Measurement - one wound '[]'$  - 0 Complex Wound Measurement - multiple wounds INTERVENTIONS - Wound Dressings X - Small Wound Dressing one or multiple wounds 1 10 '[]'$  - 0 Medium Wound Dressing one or multiple wounds '[]'$  - 0 Large Wound Dressing one or multiple wounds '[]'$  - 0 Application of Medications - topical '[]'$  - 0 Application of Medications - injection INTERVENTIONS - Miscellaneous '[]'$  - 0 External ear exam '[]'$  - 0 Specimen Collection (cultures, biopsies, blood, body fluids, etc.) '[]'$  - 0 Specimen(s) / Culture(s) sent or taken to Lab for analysis '[]'$  - 0 Patient Transfer (multiple staff / Civil Service fast streamer / Similar devices) '[]'$  - 0 Simple Staple / Suture removal (25 or less) '[]'$  - 0 Complex Staple / Suture removal (26 or more) '[]'$  - 0 Hypo / Hyperglycemic Management (close monitor of Blood Glucose) '[]'$  - 0 Ankle / Brachial Index (ABI) - do not check if billed separately X- 1 5 Vital Signs Has the patient been seen at the hospital within the last three years: Yes Total Score: 105 Level Of Care: New/Established - Level 3 Electronic Signature(s) Signed: 08/19/2020 5:35:30 PM By: Wayne Hammock RN Entered By: Wayne Pratt on 08/14/2020 13:21:56 -------------------------------------------------------------------------------- Encounter Discharge Information Details Patient Name: Date of Service: Wayne Pratt, Wayne RY W. 08/14/2020 12:45 PM Medical Record Number: [id number] Patient Account Number: [id number] Date of Birth/Sex: Treating RN: 1960/11/05 (60 y.o. Wayne Pratt Primary Care Wilborn Membreno: Wayne Pratt Other  Clinician: Referring Amalia Edgecombe: Treating Tekela Garguilo/Extender: Wayne Pratt in Treatment: 5 Encounter Discharge Information Items Discharge Condition: Stable Ambulatory Status: Ambulatory Discharge Destination: Home Transportation: Private Auto Accompanied By: self Schedule Follow-up Appointment: Yes Clinical Summary of Care: Patient Declined Electronic Signature(s) Signed: 08/19/2020 5:35:30 PM By: Wayne Hammock RN Entered By: Wayne Pratt on 08/14/2020 13:20:19 -------------------------------------------------------------------------------- Patient/Caregiver Education Details Patient Name: Date of Service: Wayne Pratt, [facility] [facility] 8/4/2022andnbsp12:45 PM Medical Record Number: [id number] Patient Account Number: [id number] Date of Birth/Gender: Treating RN: 1960/10/20 (60 y.o. Wayne Pratt Primary Care Physician: Wayne Pratt Other Clinician: Referring Physician: Treating Physician/Extender: Wayne Pratt in Treatment: 5 Education Assessment Education Provided To: Patient Education Topics Provided Elevated Blood Sugar/ Impact on Healing: Methods: Explain/Verbal Responses: State content correctly Nutrition: Methods: Explain/Verbal Responses: State content correctly Wound/Skin Impairment: Methods: Explain/Verbal Responses: State content correctly Electronic Signature(s) Signed: 08/19/2020 5:35:30 PM By: Wayne Hammock RN Entered By: Wayne Pratt on 08/14/2020 13:20:09 -------------------------------------------------------------------------------- Wound Assessment Details Patient Name: Date of Service: Wayne Pratt, Wayne RY W. 08/14/2020 12:45 PM Medical Record Number: [id number] Patient Account Number: [id number] Date of Birth/Sex: Treating RN: 1960-04-29 (60 y.o. Wayne Pratt, Wayne Primary Care Ada Woodbury: Wayne Pratt Other Clinician: Referring Ephriam Turman: Treating Cody Albus/Extender: Wayne Pratt in Treatment: 5 Wound Status Wound Number: 1 Primary Etiology: Diabetic Wound/Ulcer of the Lower Extremity Wound Location: Right, Posterior Lower Leg Wound Status: Open Wounding Event: Surgical Injury Date Acquired: 02/04/2020 Pratt Of Treatment: 5 Clustered Wound: No Wound Measurements Length: (cm) 3.5 Width: (cm) 2.6 Depth: (cm) 0.1 Area: (cm) 7.147 Volume: (cm) 0.715 % Reduction in Area: 95.1% % Reduction in Volume: 95.1% Wound Description Classification: Grade 2 Exudate Amount: Medium Exudate Type: Serosanguineous Exudate Color: red, brown Treatment Notes Wound #1 (Lower Leg) Wound Laterality: Right, Posterior Cleanser Normal Saline Discharge Instruction: Cleanse the  wound with Normal Saline prior to applying a clean dressing using gauze sponges, not tissue or cotton balls. Soap and Water Discharge Instruction: May shower and wash wound with dial antibacterial soap and water prior to dressing change. Peri-Wound Care Topical Primary Dressing Hydrofera Blue Classic Foam, 4x4 in Discharge Instruction: Moisten with saline prior to applying to wound bed Secondary Dressing Woven Gauze Sponge, Non-Sterile 4x4 in Discharge Instruction: Apply over primary dressing as directed. ABD Pad, 5x9 Discharge Instruction: Apply over primary dressing as directed. Secured With [employer] 4x5 (in/yd) Discharge Instruction: Secure with Coban not too tight Kerlix Roll Sterile, 4.5x3.1 (in/yd) Discharge Instruction: Secure with Kerlix as directed. Paper Tape, 2x10 (in/yd) Discharge Instruction: Secure dressing with tape as directed. Compression Wrap Compression Stockings Add-Ons Electronic Signature(s) Signed: 08/19/2020 5:35:30 PM By: Wayne Hammock RN Entered By: Wayne Pratt on 08/14/2020 13:19:20 -------------------------------------------------------------------------------- Vitals Details Patient Name: Date of Service: [name], [place] W.  08/14/2020 12:45 PM Medical Record Number: [id number] Patient Account Number: [id number] Date of Birth/Sex: Treating RN: 22-Mar-1960 (60 y.o. Wayne Pratt, Wayne Primary Care Jaquana Geiger: Wayne Pratt Other Clinician: Referring Jasleen Riepe: Treating Vaughn Frieze/Extender: Wayne Pratt in Treatment: 5 Vital Signs Time Taken: 13:17 Temperature (F): 97.7 Height (in): 73 Pulse (bpm): 84 Weight (lbs): 215 Respiratory Rate (breaths/min): 17 Body Mass Index (BMI): 28.4 Blood Pressure (mmHg): 136/74 Capillary Blood Glucose (mg/dl): 151 Reference Range: 80 - 120 mg / dl Electronic Signature(s) Signed: 08/19/2020 5:35:30 PM By: Wayne Hammock RN Entered By: Wayne Pratt on 08/14/2020 13:19:08

## 2020-08-19 NOTE — Progress Notes (Signed)
AYO, DEJARDIN ([id number]) Visit Report for 08/14/2020 SuperBill Details Patient Name: Date of Service: Wayne Pratt 08/14/2020 Medical Record Number: [id number] Patient Account Number: [id number] Date of Birth/Sex: Treating RN: Mar 24, 1960 (60 y.o. 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

## 2020-08-22 DIAGNOSIS — Z79891 Long term (current) use of opiate analgesic: Secondary | ICD-10-CM | POA: Diagnosis not present

## 2020-08-22 DIAGNOSIS — M4722 Other spondylosis with radiculopathy, cervical region: Secondary | ICD-10-CM | POA: Diagnosis not present

## 2020-08-22 DIAGNOSIS — I739 Peripheral vascular disease, unspecified: Secondary | ICD-10-CM | POA: Diagnosis not present

## 2020-08-22 DIAGNOSIS — E1142 Type 2 diabetes mellitus with diabetic polyneuropathy: Secondary | ICD-10-CM | POA: Diagnosis not present

## 2020-08-28 ENCOUNTER — Other Ambulatory Visit: Payer: Self-pay

## 2020-08-28 ENCOUNTER — Encounter (HOSPITAL_BASED_OUTPATIENT_CLINIC_OR_DEPARTMENT_OTHER): Payer: PPO | Admitting: Internal Medicine

## 2020-08-28 DIAGNOSIS — E11622 Type 2 diabetes mellitus with other skin ulcer: Secondary | ICD-10-CM | POA: Diagnosis not present

## 2020-08-28 DIAGNOSIS — T8189XA Other complications of procedures, not elsewhere classified, initial encounter: Secondary | ICD-10-CM | POA: Diagnosis not present

## 2020-08-28 NOTE — Progress Notes (Signed)
Wayne, Pratt ([id number]) Visit Report for 08/28/2020 HPI Details Patient Name: Date of Service: Wayne Pratt. 08/28/2020 12:45 PM Medical Record Number: [id number] Patient Account Number: [id number] Date of Birth/Sex: Treating RN: 06-27-1960 (60 y.o. Wayne Pratt Primary Care Provider: [name] Other Clinician: Referring Provider: Treating Provider/Extender: Fredderick Severance Weeks in Treatment: 7 History of Present Illness HPI Description: ADMISSION 07/04/2020 This is a 60 year old woman who presented to Dr. Carlis Abbott in January of this year having critical limb ischemia. First angiogram was on 01/31/2020 showed proximal profound disease in the SFA with right foot runoff via diseased posterior tibia. He underwent a right femoral endarterectomy with a right common femoral to below-knee popliteal bypass on 02/04/2020. There were generally good vascular results however the day after the surgery according to his wife he developed a black eschar at presumably ischemic spot on the right posterior calf. He also ultimately had amputations on 02/29/2020 of the right first second and fifth toes in March she had a wound VAC on the right calf but this was too painful and was switched to wet-to-dry. At 1 point this clearly had exposed muscle and the wife was able to document this by showing me short movie on his phone. He had an operative debridement on the right calf in early April with a skin substitute oMyriad. In any case none of this really made any difference. He was hospitalized early in June with bilateral pulmonary emboli and is now on Eliquis. Long medical problem list but predominantly type 2 diabetes with peripheral neuropathy, spinal stenosis. He is a smoker Most recent arterial studies were done on 03/18/2020 on the right this showed a an ABI in the posterior tibial of 1.05 with biphasic waveforms on the left noncompressible at 1.38 but with a great toe pressure near normal  at 1.11 again with triphasic waveforms. 6/29; patient arrives back in clinic. We use Santyl on the posterior calf wound on the right. Arrives in clinic with this looking a lot better. Healthy red granulated tissue over the majority of this. 7/7 the wound is cleaned up quite nicely I think it is time to change from Santyl to [facility]. They are using a local wrap here. There is edema in his upper lower leg. 7/21; using Hydrofera Blue. Nice improvement in surface area they are changing this every 2d 8/18; using Hydrofera Blue and bordered foam. Nice improvement in surface area Engineer, maintenance) Signed: 08/28/2020 4:52:40 PM By: [name] Entered By: Linton Ham on 08/28/2020 13:06:11 -------------------------------------------------------------------------------- Physical Exam Details Patient Name: Date of Service: Wayne Pratt, [place] Wayne W. 08/28/2020 12:45 PM Medical Record Number: [id number] Patient Account Number: [id number] Date of Birth/Sex: Treating RN: 08/31/1960 (60 y.o. Wayne Pratt Primary Care Provider: [name] Other Clinician: Referring Provider: Treating Provider/Extender: Fredderick Severance Weeks in Treatment: 7 Constitutional Sitting or standing Blood Pressure is within target range for patient.. Pulse regular and within target range for patient.[name] Kitchen Respirations regular, non-labored and within target range.. Temperature is normal and within the target range for the patient.[name] Kitchen Appears in no distress. Cardiovascular Pedal pulses are palpable. Edema present nonpitting. Notes Wound exam; right posterior calf some debris on the surface although generally it debrides off with Anasept and gauze. His significant other is doing the same thing at home with a moist facecloth. Dimensions of come down nicely. Surface otherwise has healthy granulation. No mechanical debridement was required Electronic Signature(s) Signed: 08/28/2020 4:52:40 PM By: [name]  Entered By: Linton Ham on 08/28/2020 13:07:25 -------------------------------------------------------------------------------- Physician Orders Details Patient Name: Date of Service: [name], [place] Wayne W. 08/28/2020 12:45 PM Medical Record Number: [id number] Patient Account Number: [id number] Date of Birth/Sex: Treating RN: 1960-11-15 (60 y.o. 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 - with Dr. Dellia Nims Return A Bathing/ Shower/ Hygiene May shower and wash wound with soap and water. Edema Control - Lymphedema / SCD / Other Elevate legs to the level of the heart or above for 30 minutes daily and/or when sitting, a frequency of: - 3-4 times per day Avoid standing for long periods of time. Exercise regularly Wound Treatment Wound #1 - Lower Leg Wound Laterality: Right, Posterior Cleanser: Normal Saline (Generic) Every Other Day/30 Days Discharge Instructions: Cleanse the wound with Normal Saline prior to applying a clean dressing using gauze sponges, not tissue or cotton balls. Cleanser: Soap and Water Every Other Day/30 Days Discharge Instructions: May shower and wash wound with dial antibacterial soap and water prior to dressing change. Prim Dressing: Hydrofera Blue Classic Foam, 4x4 in (Generic) Every Other Day/30 Days ary Discharge Instructions: Moisten with saline prior to applying to wound bed Secondary Dressing: Bordered Gauze, 4x4 in Every Other Day/30 Days Discharge Instructions: Apply over primary dressing as directed. Electronic  Signature(s) Signed: 08/28/2020 4:52:40 PM By: [name] Signed: 08/28/2020 6:20:17 PM By: Levan Hurst RN, BSN Entered By: Levan Hurst on 08/28/2020 13:04:20 -------------------------------------------------------------------------------- Problem List Details Patient Name: Date of Service: Wayne Pratt, Theodore Wayne W. 08/28/2020 12:45 PM Medical Record Number: [id number] Patient Account Number: [id number] Date of Birth/Sex: Treating RN: April 24, 1960 (60 y.o. 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 08/28/2020 12:45 PM Medical Record Number: [id number] Patient Account Number: [id number] Date of Birth/Sex: Treating RN: Sep 30, 1960 (60 y.o. Wayne Pratt Primary Care Provider: [name] Other Clinician: Referring Provider: Treating Provider/Extender: Fredderick Severance Weeks in Treatment: 7 Subjective History of Present Illness (HPI) ADMISSION 07/04/2020 This is a 60 year old woman who presented to Dr. Carlis Abbott in January of this year having critical limb ischemia. First angiogram was on 01/31/2020 showed proximal profound disease in the SFA with right foot runoff via diseased posterior tibia. He underwent a right femoral endarterectomy with a right  common femoral to below-knee popliteal bypass on 02/04/2020. There were generally good vascular results however the day after the surgery according to his wife he developed a black eschar at presumably ischemic spot on the right posterior calf. He also ultimately had amputations on 02/29/2020 of the right first second and fifth toes in March she had a wound VAC on the right calf but this was too painful and was switched to wet-to-dry. At 1 point  this clearly had exposed muscle and the wife was able to document this by showing me short movie on his phone. He had an operative debridement on the right calf in early April with a skin substitute oMyriad. In any case none of this really made any difference. He was hospitalized early in June with bilateral pulmonary emboli and is now on Eliquis. Long medical problem list but predominantly type 2 diabetes with peripheral neuropathy, spinal stenosis. He is a smoker Most recent arterial studies were done on 03/18/2020 on the right this showed a an ABI in the posterior tibial of 1.05 with biphasic waveforms on the left noncompressible at 1.38 but with a great toe pressure near normal at 1.11 again with triphasic waveforms. 6/29; patient arrives back in clinic. We use Santyl on the posterior calf wound on the right. Arrives in clinic with this looking a lot better. Healthy red granulated tissue over the majority of this. 7/7 the wound is cleaned up quite nicely I think it is time to change from Santyl to [facility]. They are using a local wrap here. There is edema in his upper lower leg. 7/21; using Hydrofera Blue. Nice improvement in surface area they are changing this every 2d 8/18; using Hydrofera Blue and bordered foam. Nice improvement in surface area Objective Constitutional Sitting or standing Blood Pressure is within target range for patient.. Pulse regular and within target range for patient.[name] Kitchen Respirations regular, non-labored and within target  range.. Temperature is normal and within the target range for the patient.[name] Kitchen Appears in no distress. Vitals Time Taken: 12:52 PM, Height: 73 in, Weight: 215 lbs, BMI: 28.4, Temperature: 98.7 F, Pulse: 91 bpm, Respiratory Rate: 16 breaths/min, Blood Pressure: 111/67 mmHg, Capillary Blood Glucose: 140 mg/dl. General Notes: glucose per pt report Cardiovascular Pedal pulses are palpable. Edema present nonpitting. General Notes: Wound exam; right posterior calf some debris on the surface although generally it debrides off with Anasept and gauze. His significant other is doing the same thing at home with a moist facecloth. Dimensions of come down nicely. Surface otherwise has healthy granulation. No mechanical debridement was required Integumentary (Hair, Skin) Wound #1 status is Open. Original cause of wound was Surgical Injury. The date acquired was: 02/04/2020. The wound has been in treatment 7 weeks. The wound is located on the Right,Posterior Lower Leg. The wound measures 2.5cm length x 2.2cm width x 0.1cm depth; 4.32cm^2 area and 0.432cm^3 volume. There is Fat Layer (Subcutaneous Tissue) exposed. There is no tunneling or undermining noted. There is a medium amount of serosanguineous drainage noted. The wound margin is flat and intact. There is small (1-33%) red, pink granulation within the wound bed. There is a large (67-100%) amount of necrotic tissue within the wound bed including Adherent Slough. Assessment Active Problems ICD-10 Type 2 diabetes mellitus with diabetic peripheral angiopathy without gangrene Non-pressure chronic ulcer of right calf with other specified severity Plan Follow-up Appointments: Return Appointment in 2 weeks. - with Dr. Dellia Nims Bathing/ Shower/ Hygiene: May shower and wash wound with soap and water. Edema Control - Lymphedema / SCD / Other: Elevate legs to the level of the heart or above for 30 minutes daily and/or when sitting, a frequency of: - 3-4 times per  day Avoid standing for long periods of time. Exercise regularly WOUND #1: - Lower Leg Wound Laterality: Right, Posterior Cleanser: Normal Saline (Generic) Every Other Day/30 Days Discharge Instructions: Cleanse the wound with Normal Saline prior to applying a clean dressing using gauze sponges, not tissue  or cotton balls. Cleanser: Soap and Water Every Other Day/30 Days Discharge Instructions: May shower and wash wound with dial antibacterial soap and water prior to dressing change. Prim Dressing: Hydrofera Blue Classic Foam, 4x4 in (Generic) Every Other Day/30 Days ary Discharge Instructions: Moisten with saline prior to applying to wound bed Secondary Dressing: Bordered Gauze, 4x4 in Every Other Day/30 Days Discharge Instructions: Apply over primary dressing as directed. 1. Continue Hydrofera Blue and bordered foam 2. He has some edema in the leg although he seems to be making nice progress with the surface area. No compression seems to be required in terms of wound healing but I wonder about maintenance once this is healed Electronic Signature(s) Signed: 08/28/2020 4:52:40 PM By: [name] Entered By: Linton Ham on 08/28/2020 13:08:20 -------------------------------------------------------------------------------- SuperBill Details Patient Name: Date of Service: Wayne Pratt, [place] Wayne W. 08/28/2020 Medical Record Number: [id number] Patient Account Number: [id number] Date of Birth/Sex: Treating RN: 1960-06-15 (60 y.o. 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

## 2020-08-28 NOTE — Progress Notes (Signed)
Wayne, Pratt ([id number]) Visit Report for 08/28/2020 Arrival Information Details Patient Name: Date of Service: Wayne Pratt. 08/28/2020 12:45 PM Medical Record Number: [id number] Patient Account Number: [id number] Date of Birth/Sex: Treating RN: [birth year] (60 y.o. Wayne Pratt Primary Care Provider: [name] Other Clinician: Referring Provider: Treating Provider/Extender: Christel Mormon in Treatment: 7 Visit Information History Since Last Visit Added or deleted any medications: No Patient Arrived: Ambulatory Any new allergies or adverse reactions: No Arrival Time: 12:52 Had a fall or experienced change in No Accompanied By: spouse activities of daily living that may affect Transfer Assistance: None risk of falls: Patient Identification Verified: Yes Signs or symptoms of abuse/neglect since last visito No Secondary Verification Process Completed: Yes Hospitalized since last visit: No Patient Requires Transmission-Based Precautions: No Implantable device outside of the clinic excluding No Patient Has Alerts: Yes cellular tissue based products placed in the center Patient Alerts: Patient on Blood Thinner since last visit: ABI's: R:1.05 03/22 Has Dressing in Place as Prescribed: Yes Pain Present Now: No Electronic Signature(s) Signed: 08/28/2020 6:20:17 PM By: Levan Hurst RN, BSN Entered By: Levan Hurst on 08/28/2020 12:52:35 -------------------------------------------------------------------------------- Clinic Level of Care Assessment Details Patient Name: Date of Service: [name], [name] 08/28/2020 12:45 PM Medical Record Number: [id number] Patient Account Number: [id number] Date of Birth/Sex: Treating RN: 1960-10-07 (60 y.o. Wayne Pratt Primary Care Provider: [name] Other Clinician: Referring Provider: Treating Provider/Extender: Fredderick Severance Weeks in Treatment: 7 Clinic Level of Care  Assessment Items TOOL 4 Quantity Score X- 1 0 Use when only an EandM is performed on FOLLOW-UP visit ASSESSMENTS - Nursing Assessment / Reassessment X- 1 10 Reassessment of Co-morbidities (includes updates in patient status) X- 1 5 Reassessment of Adherence to Treatment Plan ASSESSMENTS - Wound and Skin A ssessment / Reassessment X - Simple Wound Assessment / Reassessment - one wound 1 5 [] - 0 Complex Wound Assessment / Reassessment - multiple wounds [] - 0 Dermatologic / Skin Assessment (not related to wound area) ASSESSMENTS - Focused Assessment [] - 0 Circumferential Edema Measurements - multi extremities [] - 0 Nutritional Assessment / Counseling / Intervention X- 1 5 Lower Extremity Assessment (monofilament, tuning fork, pulses) [] - 0 Peripheral Arterial Disease Assessment (using hand held doppler) ASSESSMENTS - Ostomy and/or Continence Assessment and Care [] - 0 Incontinence Assessment and Management [] - 0 Ostomy Care Assessment and Management (repouching, etc.) PROCESS - Coordination of Care X - Simple Patient / Family Education for ongoing care 1 15 [] - 0 Complex (extensive) Patient / Family Education for ongoing care X- 1 10 Staff obtains Programmer, systems, Records, T Results / Process Orders est [] - 0 Staff telephones HHA, Nursing Homes / Clarify orders / etc [] - 0 Routine Transfer to another Facility (non-emergent condition) [] - 0 Routine Hospital Admission (non-emergent condition) [] - 0 New Admissions / Biomedical engineer / Ordering NPWT Apligraf, etc. , [] - 0 Emergency Hospital Admission (emergent condition) X- 1 10 Simple Discharge Coordination [] - 0 Complex (extensive) Discharge Coordination PROCESS - Special Needs [] - 0 Pediatric / Minor Patient Management [] - 0 Isolation Patient Management [] - 0 Hearing / Language / Visual special needs [] - 0 Assessment of Community assistance (transportation, D/C planning, etc.) [] -  0 Additional assistance / Altered mentation [] - 0 Support Surface(s) Assessment (bed, cushion, seat, etc.) INTERVENTIONS - Wound Cleansing / Measurement X - Simple Wound Cleansing - one wound  1 5 [] - 0 Complex Wound Cleansing - multiple wounds X- 1 5 Wound Imaging (photographs - any number of wounds) [] - 0 Wound Tracing (instead of photographs) X- 1 5 Simple Wound Measurement - one wound [] - 0 Complex Wound Measurement - multiple wounds INTERVENTIONS - Wound Dressings X - Small Wound Dressing one or multiple wounds 1 10 [] - 0 Medium Wound Dressing one or multiple wounds [] - 0 Large Wound Dressing one or multiple wounds X- 1 5 Application of Medications - topical [] - 0 Application of Medications - injection INTERVENTIONS - Miscellaneous [] - 0 External ear exam [] - 0 Specimen Collection (cultures, biopsies, blood, body fluids, etc.) [] - 0 Specimen(s) / Culture(s) sent or taken to Lab for analysis [] - 0 Patient Transfer (multiple staff / Civil Service fast streamer / Similar devices) [] - 0 Simple Staple / Suture removal (25 or less) [] - 0 Complex Staple / Suture removal (26 or more) [] - 0 Hypo / Hyperglycemic Management (close monitor of Blood Glucose) [] - 0 Ankle / Brachial Index (ABI) - do not check if billed separately X- 1 5 Vital Signs Has the patient been seen at the hospital within the last three years: Yes Total Score: 95 Level Of Care: New/Established - Level 3 Electronic Signature(s) Signed: 08/28/2020 6:20:17 PM By: Levan Hurst RN, BSN Entered By: Levan Hurst on 08/28/2020 13:03:48 -------------------------------------------------------------------------------- Encounter Discharge Information Details Patient Name: Date of Service: Wayne Pratt, [name] 08/28/2020 12:45 PM Medical Record Number: [id number] Patient Account Number: [id number] Date of Birth/Sex: Treating RN: 07/27/1960 (60 y.o. Wayne Pratt Primary Care Provider: [name] Other  Clinician: Referring Provider: Treating Provider/Extender: Christel Mormon in Treatment: 7 Encounter Discharge Information Items Discharge Condition: Stable Ambulatory Status: Ambulatory Discharge Destination: Home Transportation: Private Auto Accompanied By: spouse Schedule Follow-up Appointment: Yes Clinical Summary of Care: Patient Declined Electronic Signature(s) Signed: 08/28/2020 6:20:17 PM By: Levan Hurst RN, BSN Entered By: Levan Hurst on 08/28/2020 13:10:09 -------------------------------------------------------------------------------- Lower Extremity Assessment Details Patient Name: Date of Service: Wayne Pratt, [name] 08/28/2020 12:45 PM Medical Record Number: [id number] Patient Account Number: [id number] Date of Birth/Sex: Treating RN: October 31, 1960 (60 y.o. Wayne Pratt Primary Care Provider: [name] Other Clinician: Referring Provider: Treating Provider/Extender: Fredderick Severance Weeks in Treatment: 7 Edema Assessment Assessed: [Left: No] [Right: No] Edema: [Left: Ye] [Right: s] Calf Left: Right: Point of Measurement: 34 cm From Medial Instep 44 cm Ankle Left: Right: Point of Measurement: 9 cm From Medial Instep 26 cm Vascular Assessment Pulses: Dorsalis Pedis Palpable: [Right:Yes] Electronic Signature(s) Signed: 08/28/2020 6:20:17 PM By: Levan Hurst RN, BSN Entered By: Levan Hurst on 08/28/2020 12:53:54 -------------------------------------------------------------------------------- Multi Wound Chart Details Patient Name: Date of Service: Wayne Pratt, [name] 08/28/2020 12:45 PM Medical Record Number: [id number] Patient Account Number: [id number] Date of Birth/Sex: Treating RN: 1960-08-01 (60 y.o. [name] Primary Care Provider: [name] Other Clinician: Referring Provider: Treating Provider/Extender: Fredderick Severance Weeks in Treatment: 7 Vital Signs Height(in):  73 Capillary Blood Glucose(mg/dl): 140 Weight(lbs): 215 Pulse(bpm): 23 Body Mass Index(BMI): 28 Blood Pressure(mmHg): 111/67 Temperature(F): 98.7 Respiratory Rate(breaths/min): 16 Photos: [N/A:N/A] Right, Posterior Lower Leg N/A N/A Wound Location: Surgical Injury N/A N/A Wounding Event: Diabetic Wound/Ulcer of the Lower N/A N/A Primary Etiology: Extremity Anemia, Asthma, Sleep Apnea, N/A N/A Comorbid History: Hypertension, Peripheral Arterial Disease, Type II Diabetes, Rheumatoid Arthritis, Neuropathy 02/04/2020 N/A N/A Date Acquired: 7 N/A N/A Weeks of Treatment:  Open N/A N/A Wound Status: 2.5x2.2x0.1 N/A N/A Measurements L x W x D (cm) 4.32 N/A N/A A (cm) : rea 0.432 N/A N/A Volume (cm) : 97.10% N/A N/A % Reduction in A rea: 97.10% N/A N/A % Reduction in Volume: Grade 2 N/A N/A Classification: Medium N/A N/A Exudate A mount: Serosanguineous N/A N/A Exudate Type: red, brown N/A N/A Exudate Color: Flat and Intact N/A N/A Wound Margin: Small (1-33%) N/A N/A Granulation A mount: Red, Pink N/A N/A Granulation Quality: Large (67-100%) N/A N/A Necrotic A mount: Fat Layer (Subcutaneous Tissue): Yes N/A N/A Exposed Structures: Fascia: No Tendon: No Muscle: No Joint: No Bone: No Medium (34-66%) N/A N/A Epithelialization: Treatment Notes Electronic Signature(s) Signed: 08/28/2020 4:52:40 PM By: [name] Signed: 08/28/2020 6:11:30 PM By: Deon Pilling Entered By: Linton Ham on 08/28/2020 13:04:45 -------------------------------------------------------------------------------- Multi-Disciplinary Care Plan Details Patient Name: Date of Service: Wayne Pratt, [name] 08/28/2020 12:45 PM Medical Record Number: [id number] Patient Account Number: [id number] Date of Birth/Sex: Treating RN: Jan 21, 1960 (60 y.o. Wayne Pratt Primary Care Chad Donoghue: [name] Other Clinician: Referring Maysen Bonsignore: Treating Kayleigh Broadwell/Extender: Christel Mormon in Treatment: 7 Multidisciplinary Care Plan reviewed with physician Active Inactive Nutrition Nursing Diagnoses: Impaired glucose control: actual or potential Potential for alteratiion in Nutrition/Potential for imbalanced nutrition Goals: Patient/caregiver will maintain therapeutic glucose control Date Initiated: 07/04/2020 Target Resolution Date: 09/05/2020 Goal Status: Active Interventions: Assess HgA1c results as ordered upon admission and as needed Provide education on elevated blood sugars and impact on wound healing Provide education on nutrition Treatment Activities: Education provided on Nutrition : 08/14/2020 Patient referred to Primary Care Physician for further nutritional evaluation : 07/04/2020 Notes: Wound/Skin Impairment Nursing Diagnoses: Impaired tissue integrity Knowledge deficit related to ulceration/compromised skin integrity Goals: Patient/caregiver will verbalize understanding of skin care regimen Date Initiated: 07/04/2020 Target Resolution Date: 09/10/2020 Goal Status: Active Ulcer/skin breakdown will have a volume reduction of 30% by week 4 Date Initiated: 07/04/2020 Date Inactivated: 08/28/2020 Target Resolution Date: 08/08/2020 Goal Status: Met Interventions: Assess patient/caregiver ability to obtain necessary supplies Assess patient/caregiver ability to perform ulcer/skin care regimen upon admission and as needed Assess ulceration(s) every visit Provide education on ulcer and skin care Treatment Activities: Skin care regimen initiated : 07/04/2020 Topical wound management initiated : 07/04/2020 Notes: Electronic Signature(s) Signed: 08/28/2020 6:20:17 PM By: Levan Hurst RN, BSN Entered By: Levan Hurst on 08/28/2020 13:02:48 -------------------------------------------------------------------------------- Pain Assessment Details Patient Name: Date of Service: Wayne Pratt, [name] 08/28/2020 12:45 PM Medical Record  Number: [id number] Patient Account Number: [id number] Date of Birth/Sex: Treating RN: [birth year] (60 y.o. Wayne Pratt Primary Care Claire Dolores: [name] Other Clinician: Referring Ranen Doolin: Treating Aarika Moon/Extender: Fredderick Severance Weeks in Treatment: 7 Active Problems Location of Pain Severity and Description of Pain Patient Has Paino No Site Locations Pain Management and Medication Current Pain Management: Electronic Signature(s) Signed: 08/28/2020 6:20:17 PM By: Levan Hurst RN, BSN Entered By: Levan Hurst on 08/28/2020 12:53:11 -------------------------------------------------------------------------------- Patient/Caregiver Education Details Patient Name: Date of Service: Wayne Pratt 8/18/2022andnbsp12:45 PM Medical Record Number: [id number] Patient Account Number: [id number] Date of Birth/Gender: Treating RN: 08-04-1960 (60 y.o. Wayne Pratt Primary Care Physician: [name] Other Clinician: Referring Physician: Treating Physician/Extender: Christel Mormon in Treatment: 7 Education Assessment Education Provided To: Patient Education Topics Provided Wound/Skin Impairment: Methods: Explain/Verbal Responses: State content correctly Electronic Signature(s) Signed: 08/28/2020 6:20:17 PM By: Levan Hurst RN, BSN Entered By: Levan Hurst on 08/28/2020 13:02:59 -------------------------------------------------------------------------------- Wound Assessment  Details Patient Name: Date of Service: Wayne Pratt 08/28/2020 12:45 PM Medical Record Number: [id number] Patient Account Number: [id number] Date of Birth/Sex: Treating RN: [birth year] (60 y.o. Wayne Pratt Primary Care Provider: [name] Other Clinician: Referring Provider: Treating Provider/Extender: Fredderick Severance Weeks in Treatment: 7 Wound Status Wound Number: 1 Primary Diabetic Wound/Ulcer of the Lower  Extremity Etiology: Wound Location: Right, Posterior Lower Leg Wound Open Wounding Event: Surgical Injury Status: Date Acquired: 02/04/2020 Comorbid Anemia, Asthma, Sleep Apnea, Hypertension, Peripheral Arterial Weeks Of Treatment: 7 History: Disease, Type II Diabetes, Rheumatoid Arthritis, Neuropathy Clustered Wound: No Photos Wound Measurements Length: (cm) 2.5 Width: (cm) 2.2 Depth: (cm) 0.1 Area: (cm) 4.32 Volume: (cm) 0.432 % Reduction in Area: 97.1% % Reduction in Volume: 97.1% Epithelialization: Medium (34-66%) Tunneling: No Undermining: No Wound Description Classification: Grade 2 Wound Margin: Flat and Intact Exudate Amount: Medium Exudate Type: Serosanguineous Exudate Color: red, brown Foul Odor After Cleansing: No Slough/Fibrino Yes Wound Bed Granulation Amount: Small (1-33%) Exposed Structure Granulation Quality: Red, Pink Fascia Exposed: No Necrotic Amount: Large (67-100%) Fat Layer (Subcutaneous Tissue) Exposed: Yes Necrotic Quality: Adherent Slough Tendon Exposed: No Muscle Exposed: No Joint Exposed: No Bone Exposed: No Treatment Notes Wound #1 (Lower Leg) Wound Laterality: Right, Posterior Cleanser Normal Saline Discharge Instruction: Cleanse the wound with Normal Saline prior to applying a clean dressing using gauze sponges, not tissue or cotton balls. Soap and Water Discharge Instruction: May shower and wash wound with dial antibacterial soap and water prior to dressing change. Peri-Wound Care Topical Primary Dressing Hydrofera Blue Classic Foam, 4x4 in Discharge Instruction: Moisten with saline prior to applying to wound bed Secondary Dressing Bordered Gauze, 4x4 in Discharge Instruction: Apply over primary dressing as directed. Secured With Compression Wrap Compression Stockings Environmental education officer) Signed: 08/28/2020 6:20:17 PM By: Levan Hurst RN, BSN Entered By: Levan Hurst on 08/28/2020  12:57:16 -------------------------------------------------------------------------------- Vitals Details Patient Name: Date of Service: [name], [name] 08/28/2020 12:45 PM Medical Record Number: [id number] Patient Account Number: [id number] Date of Birth/Sex: Treating RN: Mar 03, 1960 (60 y.o. Wayne Pratt Primary Care Provider: [name] Other Clinician: Referring Provider: Treating Provider/Extender: Fredderick Severance Weeks in Treatment: 7 Vital Signs Time Taken: 12:52 Temperature (F): 98.7 Height (in): 73 Pulse (bpm): 91 Weight (lbs): 215 Respiratory Rate (breaths/min): 16 Body Mass Index (BMI): 28.4 Blood Pressure (mmHg): 111/67 Capillary Blood Glucose (mg/dl): 140 Reference Range: 80 - 120 mg / dl Notes glucose per pt report Electronic Signature(s) Signed: 08/28/2020 6:20:17 PM By: Levan Hurst RN, BSN Entered By: Levan Hurst on 08/28/2020 12:53:06

## 2020-09-11 ENCOUNTER — Other Ambulatory Visit: Payer: Self-pay

## 2020-09-11 ENCOUNTER — Encounter (HOSPITAL_BASED_OUTPATIENT_CLINIC_OR_DEPARTMENT_OTHER): Payer: PPO | Attending: Internal Medicine | Admitting: Internal Medicine

## 2020-09-11 DIAGNOSIS — E1142 Type 2 diabetes mellitus with diabetic polyneuropathy: Secondary | ICD-10-CM | POA: Diagnosis not present

## 2020-09-11 DIAGNOSIS — Z89421 Acquired absence of other right toe(s): Secondary | ICD-10-CM | POA: Insufficient documentation

## 2020-09-11 DIAGNOSIS — Z86711 Personal history of pulmonary embolism: Secondary | ICD-10-CM | POA: Insufficient documentation

## 2020-09-11 DIAGNOSIS — E1151 Type 2 diabetes mellitus with diabetic peripheral angiopathy without gangrene: Secondary | ICD-10-CM | POA: Diagnosis not present

## 2020-09-11 DIAGNOSIS — E11622 Type 2 diabetes mellitus with other skin ulcer: Secondary | ICD-10-CM | POA: Diagnosis not present

## 2020-09-11 DIAGNOSIS — T8189XA Other complications of procedures, not elsewhere classified, initial encounter: Secondary | ICD-10-CM | POA: Diagnosis not present

## 2020-09-11 DIAGNOSIS — F17200 Nicotine dependence, unspecified, uncomplicated: Secondary | ICD-10-CM | POA: Insufficient documentation

## 2020-09-11 DIAGNOSIS — L97218 Non-pressure chronic ulcer of right calf with other specified severity: Secondary | ICD-10-CM | POA: Insufficient documentation

## 2020-09-11 NOTE — Progress Notes (Signed)
Wayne Pratt ([id number]) Visit Report for 09/11/2020 Arrival Information Details Patient Name: Date of Service: Wayne Pratt. 09/11/2020 12:30 PM Medical Record Number: [id number] Patient Account Number: [id number] Date of Birth/Sex: Treating RN: 10/31/1960 (59 y.o. Wayne Pratt, Meta.Reding Primary Care Wayne Pratt: [name] Other Clinician: Referring Wayne Pratt: Treating Rowene Suto/Extender: Christel Mormon in Treatment: 9 Visit Information History Since Last Visit Added or deleted any medications: No Patient Arrived: Ambulatory Any new allergies or adverse reactions: No Arrival Time: 12:40 Had a fall or experienced change in No Accompanied By: wife activities of daily living that may affect Transfer Assistance: None risk of falls: Patient Identification Verified: Yes Signs or symptoms of abuse/neglect since last visito No Secondary Verification Process Completed: Yes Hospitalized since last visit: No Patient Requires Transmission-Based Precautions: No Implantable device outside of the clinic excluding No Patient Has Alerts: Yes cellular tissue based products placed in the center Patient Alerts: Patient on Blood Thinner since last visit: ABI's: R:1.05 03/22 Has Dressing in Place as Prescribed: Yes Pain Present Now: No Electronic Signature(s) Signed: 09/11/2020 5:36:38 PM By: Deon Pilling Entered By: Deon Pilling on 09/11/2020 12:44:39 -------------------------------------------------------------------------------- Compression Therapy Details Patient Name: Date of Service: [name], [name] 09/11/2020 12:30 PM Medical Record Number: [id number] Patient Account Number: [id number] Date of Birth/Sex: Treating RN: 26-Nov-1960 (60 y.o. [name] Primary Care Yoshio Seliga: [name] Other Clinician: Referring Gracin Mcpartland: Treating Shefali Ng/Extender: Fredderick Severance Weeks in Treatment: 9 Compression Therapy Performed for Wound Assessment: Wound #1  Right,Posterior Lower Leg Performed By: Clinician Deon Pilling, RN Compression Type: Three Layer Post Procedure Diagnosis Same as Pre-procedure Electronic Signature(s) Signed: 09/11/2020 5:36:38 PM By: Deon Pilling Entered By: Deon Pilling on 09/11/2020 12:54:11 -------------------------------------------------------------------------------- Encounter Discharge Information Details Patient Name: Date of Service: [name], [name] 09/11/2020 12:30 PM Medical Record Number: [id number] Patient Account Number: [id number] Date of Birth/Sex: Treating RN: [birth year] (60 y.o. [name] Primary Care [place] Crance: [name] Other Clinician: Referring Micki Cassel: Treating Demico Ploch/Extender: Christel Mormon in Treatment: 9 Encounter Discharge Information Items Discharge Condition: Stable Ambulatory Status: Ambulatory Discharge Destination: Home Transportation: Private Auto Accompanied By: self Schedule Follow-up Appointment: Yes Clinical Summary of Care: Electronic Signature(s) Signed: 09/11/2020 5:36:38 PM By: Deon Pilling Entered By: Deon Pilling on 09/11/2020 12:56:31 -------------------------------------------------------------------------------- Lower Extremity Assessment Details Patient Name: Date of Service: Wayne Pratt. 09/11/2020 12:30 PM Medical Record Number: [id number] Patient Account Number: [id number] Date of Birth/Sex: Treating RN: 07/31/1960 (60 y.o. [name] Primary Care Vondell Sowell: [name] Other Clinician: Referring Ellanie Oppedisano: Treating Kandra Graven/Extender: Fredderick Severance Weeks in Treatment: 9 Edema Assessment Assessed: [Left: No] [Right: Yes] Edema: [Left: Ye] [Right: s] Calf Left: Right: Point of Measurement: 34 cm From Medial Instep 44 cm Ankle Left: Right: Point of Measurement: 9 cm From Medial Instep 26 cm Vascular Assessment Pulses: Dorsalis Pedis Palpable: [Right:Yes] Electronic  Signature(s) Signed: 09/11/2020 5:36:38 PM By: Deon Pilling Entered By: Deon Pilling on 09/11/2020 12:46:51 -------------------------------------------------------------------------------- Multi Wound Chart Details Patient Name: Date of Service: [name], [name] 09/11/2020 12:30 PM Medical Record Number: [id number] Patient Account Number: [id number] Date of Birth/Sex: Treating RN: 1960-10-27 (60 y.o. [name] Primary Care Cloey Sferrazza: [name] Other Clinician: Referring Shemeika Starzyk: Treating Sadiyah Kangas/Extender: Fredderick Severance Weeks in Treatment: 9 Vital Signs Height(in): 73 Pulse(bpm): 76 Weight(lbs): 215 Blood Pressure(mmHg): 109/70 Body Mass Index(BMI): 28 Temperature(F): 98.2 Respiratory Rate(breaths/min): 16 Photos: [N/A:N/A] Right, Posterior Lower Leg N/A N/A Wound Location:  Surgical Injury N/A N/A Wounding Event: Diabetic Wound/Ulcer of the Lower N/A N/A Primary Etiology: Extremity Anemia, Asthma, Sleep Apnea, N/A N/A Comorbid History: Hypertension, Peripheral Arterial Disease, Type II Diabetes, Rheumatoid Arthritis, Neuropathy 02/04/2020 N/A N/A Date Acquired: 9 N/A N/A Weeks of Treatment: Open N/A N/A Wound Status: 1.9x1.5x0.1 N/A N/A Measurements L x W x D (cm) 2.238 N/A N/A A (cm) : rea 0.224 N/A N/A Volume (cm) : 98.50% N/A N/A % Reduction in A rea: 98.50% N/A N/A % Reduction in Volume: Grade 2 N/A N/A Classification: Medium N/A N/A Exudate A mount: Serosanguineous N/A N/A Exudate Type: red, brown N/A N/A Exudate Color: Flat and Intact N/A N/A Wound Margin: Large (67-100%) N/A N/A Granulation A mount: Red, Pink N/A N/A Granulation Quality: Small (1-33%) N/A N/A Necrotic A mount: Fat Layer (Subcutaneous Tissue): Yes N/A N/A Exposed Structures: Fascia: No T endon: No Muscle: No Joint: No Bone: No Medium (34-66%) N/A N/A Epithelialization: irritated periwound. N/A N/A Assessment Notes: Compression  Therapy N/A N/A Procedures Performed: Treatment Notes Wound #1 (Lower Leg) Wound Laterality: Right, Posterior Cleanser Normal Saline Discharge Instruction: Cleanse the wound with Normal Saline prior to applying a clean dressing using gauze sponges, not tissue or cotton balls. Soap and Water Discharge Instruction: May shower and wash wound with dial antibacterial soap and water prior to dressing change. Peri-Wound Care Triamcinolone 15 (g) Discharge Instruction: Use triamcinolone apply in Zinc Oxide Ointment 30g tube Discharge Instruction: Apply Zinc Oxide to periwound with each dressing change Sween Lotion (Moisturizing lotion) Discharge Instruction: Apply moisturizing lotion as directed Topical Primary Dressing Hydrofera Blue Classic Foam, 4x4 in Discharge Instruction: Moisten with saline prior to applying to wound bed Secondary Dressing ABD Pad, 8x10 Discharge Instruction: Apply over primary dressing as directed. Secured With Compression Wrap ThreePress (3 layer compression wrap) Discharge Instruction: Apply three layer compression as directed. Compression Stockings Add-Ons Electronic Signature(s) Signed: 09/11/2020 5:31:22 PM By: [name] Signed: 09/11/2020 5:36:38 PM By: Deon Pilling Entered By: Linton Ham on 09/11/2020 12:58:56 -------------------------------------------------------------------------------- Multi-Disciplinary Care Plan Details Patient Name: Date of Service: [name], [name] 09/11/2020 12:30 PM Medical Record Number: [id number] Patient Account Number: [id number] Date of Birth/Sex: Treating RN: 1960-03-27 (61 y.o. [name] Primary Care Stepen Prins: [name] Other Clinician: Referring Anjolina Byrer: Treating Tuwanda Vokes/Extender: Christel Mormon in Treatment: Jay reviewed with physician Active Inactive Nutrition Nursing Diagnoses: Impaired glucose control: actual or potential Potential  for alteratiion in Nutrition/Potential for imbalanced nutrition Goals: Patient/caregiver will maintain therapeutic glucose control Date Initiated: 07/04/2020 Target Resolution Date: 10/09/2020 Goal Status: Active Interventions: Assess HgA1c results as ordered upon admission and as needed Provide education on elevated blood sugars and impact on wound healing Provide education on nutrition Treatment Activities: Education provided on Nutrition : 08/14/2020 Patient referred to Primary Care Physician for further nutritional evaluation : 07/04/2020 Notes: Wound/Skin Impairment Nursing Diagnoses: Impaired tissue integrity Knowledge deficit related to ulceration/compromised skin integrity Goals: Patient/caregiver will verbalize understanding of skin care regimen Date Initiated: 07/04/2020 Target Resolution Date: 10/10/2020 Goal Status: Active Ulcer/skin breakdown will have a volume reduction of 30% by week 4 Date Initiated: 07/04/2020 Date Inactivated: 08/28/2020 Target Resolution Date: 08/08/2020 Goal Status: Met Interventions: Assess patient/caregiver ability to obtain necessary supplies Assess patient/caregiver ability to perform ulcer/skin care regimen upon admission and as needed Assess ulceration(s) every visit Provide education on ulcer and skin care Treatment Activities: Skin care regimen initiated : 07/04/2020 Topical wound management initiated : 07/04/2020 Notes: Electronic Signature(s) Signed: 09/11/2020  5:36:38 PM By: Deon Pilling Entered By: Deon Pilling on 09/11/2020 12:54:25 -------------------------------------------------------------------------------- Pain Assessment Details Patient Name: Date of Service: [name], [name] 09/11/2020 12:30 PM Medical Record Number: [id number] Patient Account Number: [id number] Date of Birth/Sex: Treating RN: 05-15-1960 (60 y.o. [name] Primary Care Sharde Gover: [name] Other Clinician: Referring Atasha Colebank: Treating  Tamon Parkerson/Extender: Fredderick Severance Weeks in Treatment: 9 Active Problems Location of Pain Severity and Description of Pain Patient Has Paino Yes Site Locations Rate the pain. Current Pain Level: 4 Pain Management and Medication Current Pain Management: Electronic Signature(s) Signed: 09/11/2020 5:36:38 PM By: Deon Pilling Entered By: Deon Pilling on 09/11/2020 12:45:30 -------------------------------------------------------------------------------- Patient/Caregiver Education Details Patient Name: Date of Service: [name], [employer]. 9/1/2022andnbsp12:30 PM Medical Record Number: [id number] Patient Account Number: [id number] Date of Birth/Gender: Treating RN: 1960-02-13 (60 y.o. [name] Primary Care Physician: [name] Other Clinician: Referring Physician: Treating Physician/Extender: Christel Mormon in Treatment: 9 Education Assessment Education Provided To: Patient Education Topics Provided Elevated Blood Sugar/ Impact on Healing: Handouts: Elevated Blood Sugars: How Do They Affect Wound Healing Methods: Explain/Verbal Responses: Reinforcements needed Electronic Signature(s) Signed: 09/11/2020 5:36:38 PM By: Deon Pilling Entered By: Deon Pilling on 09/11/2020 12:54:36 -------------------------------------------------------------------------------- Wound Assessment Details Patient Name: Date of Service: [name], [name] 09/11/2020 12:30 PM Medical Record Number: [id number] Patient Account Number: [id number] Date of Birth/Sex: Treating RN: 09-Jan-1961 (60 y.o. [name] Primary Care Saralyn Willison: [name] Other Clinician: Referring Mikena Masoner: Treating Aiko Belko/Extender: Fredderick Severance Weeks in Treatment: 9 Wound Status Wound Number: 1 Primary Diabetic Wound/Ulcer of the Lower Extremity Etiology: Wound Location: Right, Posterior Lower Leg Wound Open Wounding Event: Surgical  Injury Status: Date Acquired: 02/04/2020 Comorbid Anemia, Asthma, Sleep Apnea, Hypertension, Peripheral Arterial Weeks Of Treatment: 9 History: Disease, Type II Diabetes, Rheumatoid Arthritis, Neuropathy Clustered Wound: No Photos Wound Measurements Length: (cm) 1.9 Width: (cm) 1.5 Depth: (cm) 0.1 Area: (cm) 2.238 Volume: (cm) 0.224 % Reduction in Area: 98.5% % Reduction in Volume: 98.5% Epithelialization: Medium (34-66%) Tunneling: No Undermining: No Wound Description Classification: Grade 2 Wound Margin: Flat and Intact Exudate Amount: Medium Exudate Type: Serosanguineous Exudate Color: red, brown Foul Odor After Cleansing: No Slough/Fibrino Yes Wound Bed Granulation Amount: Large (67-100%) Exposed Structure Granulation Quality: Red, Pink Fascia Exposed: No Necrotic Amount: Small (1-33%) Fat Layer (Subcutaneous Tissue) Exposed: Yes Necrotic Quality: Adherent Slough Tendon Exposed: No Muscle Exposed: No Joint Exposed: No Bone Exposed: No Assessment Notes irritated periwound. Treatment Notes Wound #1 (Lower Leg) Wound Laterality: Right, Posterior Cleanser Normal Saline Discharge Instruction: Cleanse the wound with Normal Saline prior to applying a clean dressing using gauze sponges, not tissue or cotton balls. Soap and Water Discharge Instruction: May shower and wash wound with dial antibacterial soap and water prior to dressing change. Peri-Wound Care Triamcinolone 15 (g) Discharge Instruction: Use triamcinolone apply in Zinc Oxide Ointment 30g tube Discharge Instruction: Apply Zinc Oxide to periwound with each dressing change Sween Lotion (Moisturizing lotion) Discharge Instruction: Apply moisturizing lotion as directed Topical Primary Dressing Hydrofera Blue Classic Foam, 4x4 in Discharge Instruction: Moisten with saline prior to applying to wound bed Secondary Dressing ABD Pad, 8x10 Discharge Instruction: Apply over primary dressing as  directed. Secured With Compression Wrap ThreePress (3 layer compression wrap) Discharge Instruction: Apply three layer compression as directed. Compression Stockings Add-Ons Electronic Signature(s) Signed: 09/11/2020 5:36:38 PM By: Deon Pilling Entered By: Deon Pilling on 09/11/2020 12:46:35 -------------------------------------------------------------------------------- Vitals Details Patient Name: Date of Service:  [name], [name] 09/11/2020 12:30 PM Medical Record Number: [id number] Patient Account Number: [id number] Date of Birth/Sex: Treating RN: 11-19-1960 (60 y.o. [name] Primary Care Kemaya Dorner: [name] Other Clinician: Referring Fleming Prill: Treating Donella Pascarella/Extender: Fredderick Severance Weeks in Treatment: 9 Vital Signs Time Taken: 12:40 Temperature (F): 98.2 Height (in): 73 Pulse (bpm): 76 Weight (lbs): 215 Respiratory Rate (breaths/min): 16 Body Mass Index (BMI): 28.4 Blood Pressure (mmHg): 109/70 Reference Range: 80 - 120 mg / dl Electronic Signature(s) Signed: 09/11/2020 5:36:38 PM By: Deon Pilling Entered By: Deon Pilling on 09/11/2020 12:45:21

## 2020-09-11 NOTE — Progress Notes (Signed)
KEL, AMADON ([id number]) Visit Report for 09/11/2020 HPI Details Patient Name: Date of Service: Wayne Pratt. 09/11/2020 12:30 PM Medical Record Number: [id number] Patient Account Number: [id number] Date of Birth/Sex: Treating RN: 09-15-1960 (60 y.o. Wayne Pratt Primary Care Provider: [name] Other Clinician: Referring Provider: Treating Provider/Extender: Fredderick Severance Weeks in Treatment: 9 History of Present Illness HPI Description: ADMISSION 07/04/2020 This is a 60 year old woman who presented to Dr. Carlis Abbott in January of this year having critical limb ischemia. First angiogram was on 01/31/2020 showed proximal profound disease in the SFA with right foot runoff via diseased posterior tibia. He underwent a right femoral endarterectomy with a right common femoral to below-knee popliteal bypass on 02/04/2020. There were generally good vascular results however the day after the surgery according to his wife he developed a black eschar at presumably ischemic spot on the right posterior calf. He also ultimately had amputations on 02/29/2020 of the right first second and fifth toes in March she had a wound VAC on the right calf but this was too painful and was switched to wet-to-dry. At 1 point this clearly had exposed muscle and the wife was able to document this by showing me short movie on his phone. He had an operative debridement on the right calf in early April with a skin substitute oMyriad. In any case none of this really made any difference. He was hospitalized early in June with bilateral pulmonary emboli and is now on Eliquis. Long medical problem list but predominantly type 2 diabetes with peripheral neuropathy, spinal stenosis. He is a smoker Most recent arterial studies were done on 03/18/2020 on the right this showed a an ABI in the posterior tibial of 1.05 with biphasic waveforms on the left noncompressible at 1.38 but with a great toe pressure near normal  at 1.11 again with triphasic waveforms. 6/29; patient arrives back in clinic. We use Santyl on the posterior calf wound on the right. Arrives in clinic with this looking a lot better. Healthy red granulated tissue over the majority of this. 7/7 the wound is cleaned up quite nicely I think it is time to change from Santyl to [facility]. They are using a local wrap here. There is edema in his upper lower leg. 7/21; using Hydrofera Blue. Nice improvement in surface area they are changing this every 2d 8/18; using Hydrofera Blue and bordered foam. Nice improvement in surface area 9/1; patient has some degree of lymphedema in the right leg we have not been wrapping him using Hydrofera Blue and bordered foam making decent progress in surface area. T oday he arrives with some erythema below the wound scaly skin this there is some form of contact dermatitis perhaps stasis dermatitis. I am not thinking that this is actually bacterial cellulitis as it is neither warm nor tender. The wound itself actually continues to look quite good Electronic Signature(s) Signed: 09/11/2020 5:31:22 PM By: [name] Entered By: Linton Ham on 09/11/2020 13:00:59 -------------------------------------------------------------------------------- Physical Exam Details Patient Name: Date of Service: Wayne Pratt, [name] 09/11/2020 12:30 PM Medical Record Number: [id number] Patient Account Number: [id number] Date of Birth/Sex: Treating RN: 04-20-1960 (60 y.o. Wayne Pratt Primary Care Provider: [name] Other Clinician: Referring Provider: Treating Provider/Extender: Fredderick Severance Weeks in Treatment: 9 Constitutional Sitting or standing Blood Pressure is within target range for patient.. Pulse regular and within target range for patient.[name] Kitchen Respirations regular, non-labored and within target range.. Temperature is normal and within  the target range for the patient.[name] Kitchen Appears in no  distress. Notes Wound exam; right posterior calf. Actually healthy looking surface with surrounding epithelialization however below the wound erythematous dry scaly skin. Not particularly warm or tender. I do not believe that this is an active infection Electronic Signature(s) Signed: 09/11/2020 5:31:22 PM By: [name] Entered By: Linton Ham on 09/11/2020 13:01:55 -------------------------------------------------------------------------------- Physician Orders Details Patient Name: Date of Service: Wayne Pratt, [name] 09/11/2020 12:30 PM Medical Record Number: [id number] Patient Account Number: [id number] Date of Birth/Sex: Treating RN: 1960/04/23 (60 y.o. Wayne Pratt Primary Care Provider: [name] Other Clinician: Referring Provider: Treating Provider/Extender: Fredderick Severance Weeks in Treatment: 9 Verbal / Phone Orders: No Diagnosis Coding Follow-up Appointments ppointment in 1 week. - Dr. Dellia Nims Tuesday Return A Bathing/ Shower/ Hygiene May shower with protection but do not get wound dressing(s) wet. Edema Control - Lymphedema / SCD / Other Elevate legs to the level of the heart or above for 30 minutes daily and/or when sitting, a frequency of: - 3-4 times per day Avoid standing for long periods of time. Exercise regularly Wound Treatment Wound #1 - Lower Leg Wound Laterality: Right, Posterior Cleanser: Normal Saline (Generic) 1 x Per Week/30 Days Discharge Instructions: Cleanse the wound with Normal Saline prior to applying a clean dressing using gauze sponges, not tissue or cotton balls. Cleanser: Soap and Water 1 x Per Week/30 Days Discharge Instructions: May shower and wash wound with dial antibacterial soap and water prior to dressing change. Peri-Wound Care: Triamcinolone 15 (g) 1 x Per Week/30 Days Discharge Instructions: Use triamcinolone apply in Peri-Wound Care: Zinc Oxide Ointment 30g tube 1 x Per Week/30 Days Discharge  Instructions: Apply Zinc Oxide to periwound with each dressing change Peri-Wound Care: Sween Lotion (Moisturizing lotion) 1 x Per Week/30 Days Discharge Instructions: Apply moisturizing lotion as directed Prim Dressing: Hydrofera Blue Classic Foam, 4x4 in (Generic) 1 x Per Week/30 Days ary Discharge Instructions: Moisten with saline prior to applying to wound bed Secondary Dressing: ABD Pad, 8x10 1 x Per Week/30 Days Discharge Instructions: Apply over primary dressing as directed. Compression Wrap: ThreePress (3 layer compression wrap) 1 x Per Week/30 Days Discharge Instructions: Apply three layer compression as directed. Electronic Signature(s) Signed: 09/11/2020 5:31:22 PM By: [name] Signed: 09/11/2020 5:36:38 PM By: Deon Pilling Entered By: Deon Pilling on 09/11/2020 12:54:58 -------------------------------------------------------------------------------- Problem List Details Patient Name: Date of Service: [name], [name] 09/11/2020 12:30 PM Medical Record Number: [id number] Patient Account Number: [id number] Date of Birth/Sex: Treating RN: September 29, 1960 (60 y.o. Wayne Pratt Primary Care Provider: [name] Other Clinician: Referring Provider: Treating Provider/Extender: Fredderick Severance Weeks in Treatment: 9 Active Problems ICD-10 Encounter Code Description Active Date MDM Diagnosis E11.51 Type 2 diabetes mellitus with diabetic peripheral angiopathy without gangrene 07/04/2020 No Yes L97.218 Non-pressure chronic ulcer of right calf with other specified severity 07/04/2020 No Yes Inactive Problems Resolved Problems Electronic Signature(s) Signed: 09/11/2020 5:31:22 PM By: [name] Entered By: Linton Ham on 09/11/2020 12:58:47 -------------------------------------------------------------------------------- Progress Note Details Patient Name: Date of Service: Wayne Pratt, [name] 09/11/2020 12:30 PM Medical Record Number: [id number] Patient  Account Number: [id number] Date of Birth/Sex: Treating RN: 06-Jun-1960 (60 y.o. Wayne Pratt Primary Care Provider: [name] Other Clinician: Referring Provider: Treating Provider/Extender: Fredderick Severance Weeks in Treatment: 9 Subjective History of Present Illness (HPI) ADMISSION 07/04/2020 This is a 60 year old woman who presented to Dr. Carlis Abbott in January of this  year having critical limb ischemia. First angiogram was on 01/31/2020 showed proximal profound disease in the SFA with right foot runoff via diseased posterior tibia. He underwent a right femoral endarterectomy with a right common femoral to below-knee popliteal bypass on 02/04/2020. There were generally good vascular results however the day after the surgery according to his wife he developed a black eschar at presumably ischemic spot on the right posterior calf. He also ultimately had amputations on 02/29/2020 of the right first second and fifth toes in March she had a wound VAC on the right calf but this was too painful and was switched to wet-to-dry. At 1 point this clearly had exposed muscle and the wife was able to document this by showing me short movie on his phone. He had an operative debridement on the right calf in early April with a skin substitute oMyriad. In any case none of this really made any difference. He was hospitalized early in June with bilateral pulmonary emboli and is now on Eliquis. Long medical problem list but predominantly type 2 diabetes with peripheral neuropathy, spinal stenosis. He is a smoker Most recent arterial studies were done on 03/18/2020 on the right this showed a an ABI in the posterior tibial of 1.05 with biphasic waveforms on the left noncompressible at 1.38 but with a great toe pressure near normal at 1.11 again with triphasic waveforms. 6/29; patient arrives back in clinic. We use Santyl on the posterior calf wound on the right. Arrives in clinic with this looking a lot  better. Healthy red granulated tissue over the majority of this. 7/7 the wound is cleaned up quite nicely I think it is time to change from Santyl to [facility]. They are using a local wrap here. There is edema in his upper lower leg. 7/21; using Hydrofera Blue. Nice improvement in surface area they are changing this every 2d 8/18; using Hydrofera Blue and bordered foam. Nice improvement in surface area 9/1; patient has some degree of lymphedema in the right leg we have not been wrapping him using Hydrofera Blue and bordered foam making decent progress in surface area. T oday he arrives with some erythema below the wound scaly skin this there is some form of contact dermatitis perhaps stasis dermatitis. I am not thinking that this is actually bacterial cellulitis as it is neither warm nor tender. The wound itself actually continues to look quite good Objective Constitutional Sitting or standing Blood Pressure is within target range for patient.. Pulse regular and within target range for patient.[name] Kitchen Respirations regular, non-labored and within target range.. Temperature is normal and within the target range for the patient.[name] Kitchen Appears in no distress. Vitals Time Taken: 12:40 PM, Height: 73 in, Weight: 215 lbs, BMI: 28.4, Temperature: 98.2 F, Pulse: 76 bpm, Respiratory Rate: 16 breaths/min, Blood Pressure: 109/70 mmHg. General Notes: Wound exam; right posterior calf. Actually healthy looking surface with surrounding epithelialization however below the wound erythematous dry scaly skin. Not particularly warm or tender. I do not believe that this is an active infection Integumentary (Hair, Skin) Wound #1 status is Open. Original cause of wound was Surgical Injury. The date acquired was: 02/04/2020. The wound has been in treatment 9 weeks. The wound is located on the Right,Posterior Lower Leg. The wound measures 1.9cm length x 1.5cm width x 0.1cm depth; 2.238cm^2 area and 0.224cm^3 volume. There is  Fat Layer (Subcutaneous Tissue) exposed. There is no tunneling or undermining noted. There is a medium amount of serosanguineous drainage noted. The wound margin  is flat and intact. There is large (67-100%) red, pink granulation within the wound bed. There is a small (1-33%) amount of necrotic tissue within the wound bed including Adherent Slough. General Notes: irritated periwound. Assessment Active Problems ICD-10 Type 2 diabetes mellitus with diabetic peripheral angiopathy without gangrene Non-pressure chronic ulcer of right calf with other specified severity Procedures Wound #1 Pre-procedure diagnosis of Wound #1 is a Diabetic Wound/Ulcer of the Lower Extremity located on the Right,Posterior Lower Leg . There was a Three Layer Compression Therapy Procedure by Deon Pilling, RN. Post procedure Diagnosis Wound #1: Same as Pre-Procedure Plan Follow-up Appointments: Return Appointment in 1 week. - Dr. Dellia Nims Tuesday Bathing/ Shower/ Hygiene: May shower with protection but do not get wound dressing(s) wet. Edema Control - Lymphedema / SCD / Other: Elevate legs to the level of the heart or above for 30 minutes daily and/or when sitting, a frequency of: - 3-4 times per day Avoid standing for long periods of time. Exercise regularly WOUND #1: - Lower Leg Wound Laterality: Right, Posterior Cleanser: Normal Saline (Generic) 1 x Per Week/30 Days Discharge Instructions: Cleanse the wound with Normal Saline prior to applying a clean dressing using gauze sponges, not tissue or cotton balls. Cleanser: Soap and Water 1 x Per Week/30 Days Discharge Instructions: May shower and wash wound with dial antibacterial soap and water prior to dressing change. Peri-Wound Care: Triamcinolone 15 (g) 1 x Per Week/30 Days Discharge Instructions: Use triamcinolone apply in Peri-Wound Care: Zinc Oxide Ointment 30g tube 1 x Per Week/30 Days Discharge Instructions: Apply Zinc Oxide to periwound with each dressing  change Peri-Wound Care: Sween Lotion (Moisturizing lotion) 1 x Per Week/30 Days Discharge Instructions: Apply moisturizing lotion as directed Prim Dressing: Hydrofera Blue Classic Foam, 4x4 in (Generic) 1 x Per Week/30 Days ary Discharge Instructions: Moisten with saline prior to applying to wound bed Secondary Dressing: ABD Pad, 8x10 1 x Per Week/30 Days Discharge Instructions: Apply over primary dressing as directed. Com pression Wrap: ThreePress (3 layer compression wrap) 1 x Per Week/30 Days Discharge Instructions: Apply three layer compression as directed. 1. I continued with Hydrofera Blue TCA with zinc oxide over the erythematous area but this time I put him in 3 layer compression. He has never had good edema control in this right leg. There is no clear evidence of a DVT or cellulitis 2. He does have a history of revascularized PAD I think he should be able to handle 3 layer compression but we certainly cannot go any higher. His pedal pulses were palpable ABI of 1.05 Electronic Signature(s) Signed: 09/11/2020 5:31:22 PM By: [name] Entered By: Linton Ham on 09/11/2020 13:03:35 -------------------------------------------------------------------------------- SuperBill Details Patient Name: Date of Service: Wayne Pratt, [name] 09/11/2020 Medical Record Number: [id number] Patient Account Number: [id number] Date of Birth/Sex: Treating RN: July 29, 1960 (61 y.o. Wayne Pratt Primary Care Provider: [name] Other Clinician: Referring Provider: Treating Provider/Extender: Fredderick Severance Weeks in Treatment: 9 Diagnosis Coding ICD-10 Codes Code Description E11.51 Type 2 diabetes mellitus with diabetic peripheral angiopathy without gangrene L97.218 Non-pressure chronic ulcer of right calf with other specified severity Facility Procedures CPT4 Code: [id number] Description: (Facility Use Only) [phone] - APPLY MULTLAY COMPRS LWR RT  LEG Modifier: Quantity: 1 Physician Procedures : CPT4 Code Description Modifier [id number] - WC PHYS LEVEL 3 - EST PT ICD-10 Diagnosis Description E11.51 Type 2 diabetes mellitus with diabetic peripheral angiopathy without gangrene L97.218 Non-pressure chronic ulcer of right calf with other  specified severity  Quantity: 1 Electronic Signature(s) Signed: 09/11/2020 5:31:22 PM By: [name] Entered By: Linton Ham on 09/11/2020 13:03:54

## 2020-09-16 ENCOUNTER — Encounter (HOSPITAL_BASED_OUTPATIENT_CLINIC_OR_DEPARTMENT_OTHER): Payer: PPO | Admitting: Internal Medicine

## 2020-09-16 ENCOUNTER — Other Ambulatory Visit: Payer: Self-pay

## 2020-09-16 DIAGNOSIS — L97212 Non-pressure chronic ulcer of right calf with fat layer exposed: Secondary | ICD-10-CM | POA: Diagnosis not present

## 2020-09-16 DIAGNOSIS — E11622 Type 2 diabetes mellitus with other skin ulcer: Secondary | ICD-10-CM | POA: Diagnosis not present

## 2020-09-16 NOTE — Progress Notes (Signed)
LILA, HRABE ([id number]) Visit Report for 09/16/2020 Debridement Details Patient Name: Date of Service: Wayne Pratt. 09/16/2020 2:30 PM Medical Record Number: [id number] Patient Account Number: [id number] Date of Birth/Sex: Treating RN: Wayne Pratt (60 y.o. Wayne Pratt, Wayne Pratt Primary Care Provider: Catalina Pratt Other Clinician: Referring Provider: Treating Provider/Extender: Wayne Pratt Weeks in Treatment: 10 Debridement Performed for Assessment: Wound #1 Right,Posterior Lower Leg Performed By: Physician Wayne Pratt., MD Debridement Type: Debridement Severity of Tissue Pre Debridement: Fat layer exposed Level of Consciousness (Pre-procedure): Awake and Alert Pre-procedure Verification/Time Out Yes - 16:30 Taken: Start Time: 16:30 Pain Control: Lidocaine T Area Debrided (L x W): otal 1.6 (cm) x 0.7 (cm) = 1.12 (cm) Tissue and other material debrided: Viable, Non-Viable, Slough, Subcutaneous, Skin: Dermis , Skin: Epidermis, Slough Level: Skin/Subcutaneous Tissue Debridement Description: Excisional Instrument: Curette Bleeding: Minimum Hemostasis Achieved: Pressure End Time: 16:30 Procedural Pain: 0 Post Procedural Pain: 0 Response to Treatment: Procedure was tolerated well Level of Consciousness (Post- Awake and Alert procedure): Post Debridement Measurements of Total Wound Length: (cm) 1.6 Width: (cm) 0.7 Depth: (cm) 0.1 Volume: (cm) 0.088 Character of Wound/Ulcer Post Debridement: Improved Severity of Tissue Post Debridement: Fat layer exposed Post Procedure Diagnosis Same as Pre-procedure Electronic Signature(s) Signed: 09/16/2020 5:13:37 PM By: Wayne Ham MD Signed: 09/16/2020 5:22:11 PM By: Wayne Hammock RN Entered By: Wayne Pratt on 09/16/2020 17:08:05 -------------------------------------------------------------------------------- HPI Details Patient Name: Date of Service: Wayne Pratt, [name] 09/16/2020 2:30 PM Medical  Record Number: [id number] Patient Account Number: [id number] Date of Birth/Sex: Treating RN: Pr[birth year] (60 y.o. [name] Primary Care Provider: Catalina Pratt Other Clinician: Referring Provider: Treating Provider/Extender: Wayne Pratt Weeks in Treatment: 10 History of Present Illness HPI Description: ADMISSION 07/04/2020 This is a 60 year old woman who presented to Dr. Carlis Abbott in January of this year having critical limb ischemia. First angiogram was on 01/31/2020 showed proximal profound disease in the SFA with right foot runoff via diseased posterior tibia. He underwent a right femoral endarterectomy with a right common femoral to below-knee popliteal bypass on 02/04/2020. There were generally good vascular results however the day after the surgery according to his wife he developed a black eschar at presumably ischemic spot on the right posterior calf. He also ultimately had amputations on 02/29/2020 of the right first second and fifth toes in March she had a wound VAC on the right calf but this was too painful and was switched to wet-to-dry. At 1 point this clearly had exposed muscle and the wife was able to document this by showing me short movie on his phone. He had an operative debridement on the right calf in early April with a skin substitute oMyriad. In any case none of this really made any difference. He was hospitalized early in June with bilateral pulmonary emboli and is now on Eliquis. Long medical problem list but predominantly type 2 diabetes with peripheral neuropathy, spinal stenosis. He is a smoker Most recent arterial studies were done on 03/18/2020 on the right this showed a an ABI in the posterior tibial of 1.05 with biphasic waveforms on the left noncompressible at 1.38 but with a great toe pressure near normal at 1.11 again with triphasic waveforms. 6/29; patient arrives back in clinic. We use Santyl on the posterior calf wound on the right.  Arrives in clinic with this looking a lot better. Healthy red granulated tissue over the majority of this. 7/7 the wound is cleaned up quite nicely I think it  is time to change from [facility] to [facility]. They are using a local wrap here. There is edema in his upper lower leg. 7/21; using Hydrofera Blue. Nice improvement in surface area they are changing this every 2d 8/18; using Hydrofera Blue and bordered foam. Nice improvement in surface area 9/1; patient has some degree of lymphedema in the right leg we have not been wrapping him using Hydrofera Blue and bordered foam making decent progress in surface area. T oday he arrives with some erythema below the wound scaly skin this there is some form of contact dermatitis perhaps stasis dermatitis. I am not thinking that this is actually bacterial cellulitis as it is neither warm nor tender. The wound itself actually continues to look quite good 9/6; the patient's lymphedema in the right leg is considerably improved with compression. Also result of this the wound area is much better. We have been using Hydrofera Blue now under compression Electronic Signature(s) Signed: 09/16/2020 5:13:37 PM By: Wayne Ham MD Entered By: Wayne Pratt on 09/16/2020 17:08:57 -------------------------------------------------------------------------------- Physical Exam Details Patient Name: Date of Service: Wayne Pratt, [name] 09/16/2020 2:30 PM Medical Record Number: [id number] Patient Account Number: [id number] Date of Birth/Sex: Treating RN: [birth year] (60 y.o. [name] Primary Care Provider: Catalina Pratt Other Clinician: Referring Provider: Treating Provider/Extender: Wayne Pratt Weeks in Treatment: 10 Constitutional Patient is hypertensive.. Pulse regular and within target range for patient.[name] Kitchen Respirations regular, non-labored and within target range.. Temperature is normal and within the target range for the patient.[name] Kitchen  Appears in no distress. Notes Wound exam; right posterior calf. #5 curette to remove some gritty surface debris this cleans up really quite nicely and is about half the size of the last time. Much better edema control. No evidence of Electronic Signature(s) Signed: 09/16/2020 5:13:37 PM By: Wayne Ham MD Entered By: Wayne Pratt on 09/16/2020 17:09:38 -------------------------------------------------------------------------------- Physician Orders Details Patient Name: Date of Service: Wayne Pratt, [place] RY W. 09/16/2020 2:30 PM Medical Record Number: [id number] Patient Account Number: [id number] Date of Birth/Sex: Treating RN: [birth year] (60 y.o. [name] Primary Care Provider: Catalina Pratt Other Clinician: Referring Provider: Treating Provider/Extender: Wayne Pratt Weeks in Treatment: 10 Verbal / Phone Orders: No Diagnosis Coding Follow-up Appointments ppointment in 1 week. - Dr. Dellia Nims Tuesday Return A Bathing/ Shower/ Hygiene May shower with protection but do not get wound dressing(s) wet. Edema Control - Lymphedema / SCD / Other Elevate legs to the level of the heart or above for 30 minutes daily and/or when sitting, a frequency of: - 3-4 times per day Avoid standing for long periods of time. Exercise regularly Wound Treatment Wound #1 - Lower Leg Wound Laterality: Right, Posterior Cleanser: Normal Saline (Generic) 1 x Per Week/30 Days Discharge Instructions: Cleanse the wound with Normal Saline prior to applying a clean dressing using gauze sponges, not tissue or cotton balls. Cleanser: Soap and Water 1 x Per Week/30 Days Discharge Instructions: May shower and wash wound with dial antibacterial soap and water prior to dressing change. Peri-Wound Care: Triamcinolone 15 (g) 1 x Per Week/30 Days Discharge Instructions: Use triamcinolone apply in Peri-Wound Care: Zinc Oxide Ointment 30g tube 1 x Per Week/30 Days Discharge Instructions: Apply Zinc  Oxide to periwound with each dressing change Peri-Wound Care: Sween Lotion (Moisturizing lotion) 1 x Per Week/30 Days Discharge Instructions: Apply moisturizing lotion as directed Prim Dressing: Hydrofera Blue Classic Foam, 4x4 in (Generic) 1 x Per Week/30 Days ary Discharge Instructions: Moisten with saline prior to  applying to wound bed Secondary Dressing: ABD Pad, 8x10 1 x Per Week/30 Days Discharge Instructions: Apply over primary dressing as directed. Compression Wrap: ThreePress (3 layer compression wrap) 1 x Per Week/30 Days Discharge Instructions: Apply three layer compression as directed. Electronic Signature(s) Signed: 09/16/2020 5:13:37 PM By: Wayne Ham MD Signed: 09/16/2020 5:22:11 PM By: Wayne Hammock RN Entered By: Wayne Pratt on 09/16/2020 15:44:45 -------------------------------------------------------------------------------- Problem List Details Patient Name: Date of Service: [name], [name] 09/16/2020 2:30 PM Medical Record Number: [id number] Patient Account Number: [id number] Date of Birth/Sex: Treating RN: [birth year] Pratt (60 y.o. Wayne Pratt, Wayne Pratt Primary Care Provider: Catalina Pratt Other Clinician: Referring Provider: Treating Provider/Extender: Wayne Pratt Weeks in Treatment: 10 Active Problems ICD-10 Encounter Code Description Active Date MDM Diagnosis E11.51 Type 2 diabetes mellitus with diabetic peripheral angiopathy without gangrene 07/04/2020 No Yes L97.218 Non-pressure chronic ulcer of right calf with other specified severity 07/04/2020 No Yes Inactive Problems Resolved Problems Electronic Signature(s) Signed: 09/16/2020 5:13:37 PM By: Wayne Ham MD Entered By: Wayne Pratt on 09/16/2020 17:07:47 -------------------------------------------------------------------------------- Progress Note Details Patient Name: Date of Service: Wayne Pratt, [name] 09/16/2020 2:30 PM Medical Record Number: [id number] Patient Account  Number: [id number] Date of Birth/Sex: Treating RN: Pr[birth year] (60 y.o. [name] Primary Care Provider: Catalina Pratt Other Clinician: Referring Provider: Treating Provider/Extender: Wayne Pratt Weeks in Treatment: 10 Subjective History of Present Illness (HPI) ADMISSION 07/04/2020 This is a 60 year old woman who presented to Dr. Carlis Abbott in January of this year having critical limb ischemia. First angiogram was on 01/31/2020 showed proximal profound disease in the SFA with right foot runoff via diseased posterior tibia. He underwent a right femoral endarterectomy with a right common femoral to below-knee popliteal bypass on 02/04/2020. There were generally good vascular results however the day after the surgery according to his wife he developed a black eschar at presumably ischemic spot on the right posterior calf. He also ultimately had amputations on 02/29/2020 of the right first second and fifth toes in March she had a wound VAC on the right calf but this was too painful and was switched to wet-to-dry. At 1 point this clearly had exposed muscle and the wife was able to document this by showing me short movie on his phone. He had an operative debridement on the right calf in early April with a skin substitute oMyriad. In any case none of this really made any difference. He was hospitalized early in June with bilateral pulmonary emboli and is now on Eliquis. Long medical problem list but predominantly type 2 diabetes with peripheral neuropathy, spinal stenosis. He is a smoker Most recent arterial studies were done on 03/18/2020 on the right this showed a an ABI in the posterior tibial of 1.05 with biphasic waveforms on the left noncompressible at 1.38 but with a great toe pressure near normal at 1.11 again with triphasic waveforms. 6/29; patient arrives back in clinic. We use Santyl on the posterior calf wound on the right. Arrives in clinic with this looking a lot  better. Healthy red granulated tissue over the majority of this. 7/7 the wound is cleaned up quite nicely I think it is time to change from Santyl to [facility]. They are using a local wrap here. There is edema in his upper lower leg. 7/21; using Hydrofera Blue. Nice improvement in surface area they are changing this every 2d 8/18; using Hydrofera Blue and bordered foam. Nice improvement in surface area 9/1; patient has some degree of  lymphedema in the right leg we have not been wrapping him using Hydrofera Blue and bordered foam making decent progress in surface area. T oday he arrives with some erythema below the wound scaly skin this there is some form of contact dermatitis perhaps stasis dermatitis. I am not thinking that this is actually bacterial cellulitis as it is neither warm nor tender. The wound itself actually continues to look quite good 9/6; the patient's lymphedema in the right leg is considerably improved with compression. Also result of this the wound area is much better. We have been using Hydrofera Blue now under compression Objective Constitutional Patient is hypertensive.. Pulse regular and within target range for patient.[name] Kitchen Respirations regular, non-labored and within target range.. Temperature is normal and within the target range for the patient.[name] Kitchen Appears in no distress. Vitals Time Taken: 3:21 PM, Height: 73 in, Weight: 215 lbs, BMI: 28.4, Temperature: 98.2 F, Pulse: 87 bpm, Respiratory Rate: 16 breaths/min, Blood Pressure: 146/81 mmHg. General Notes: Wound exam; right posterior calf. #5 curette to remove some gritty surface debris this cleans up really quite nicely and is about half the size of the last time. Much better edema control. No evidence of Integumentary (Hair, Skin) Wound #1 status is Open. Original cause of wound was Surgical Injury. The date acquired was: 02/04/2020. The wound has been in treatment 10 weeks. The wound is located on the Right,Posterior  Lower Leg. The wound measures 1.6cm length x 0.7cm width x 0.1cm depth; 0.88cm^2 area and 0.088cm^3 volume. There is Fat Layer (Subcutaneous Tissue) exposed. There is a medium amount of serosanguineous drainage noted. The wound margin is flat and intact. There is large (67-100%) red, pink granulation within the wound bed. There is a small (1-33%) amount of necrotic tissue within the wound bed including Adherent Slough. Assessment Active Problems ICD-10 Type 2 diabetes mellitus with diabetic peripheral angiopathy without gangrene Non-pressure chronic ulcer of right calf with other specified severity Procedures Wound #1 Pre-procedure diagnosis of Wound #1 is a Diabetic Wound/Ulcer of the Lower Extremity located on the Right,Posterior Lower Leg .Severity of Tissue Pre Debridement is: Fat layer exposed. There was a Excisional Skin/Subcutaneous Tissue Debridement with a total area of 1.12 sq cm performed by Wayne Pratt., MD. With the following instrument(s): Curette to remove Viable and Non-Viable tissue/material. Material removed includes Subcutaneous Tissue, Slough, Skin: Dermis, and Skin: Epidermis after achieving pain control using Lidocaine. No specimens were taken. A time out was conducted at 16:30, prior to the start of the procedure. A Minimum amount of bleeding was controlled with Pressure. The procedure was tolerated well with a pain level of 0 throughout and a pain level of 0 following the procedure. Post Debridement Measurements: 1.6cm length x 0.7cm width x 0.1cm depth; 0.088cm^3 volume. Character of Wound/Ulcer Post Debridement is improved. Severity of Tissue Post Debridement is: Fat layer exposed. Post procedure Diagnosis Wound #1: Same as Pre-Procedure Pre-procedure diagnosis of Wound #1 is a Diabetic Wound/Ulcer of the Lower Extremity located on the Right,Posterior Lower Leg . There was a Three Layer Compression Therapy Procedure by Wayne Hammock, RN. Post procedure  Diagnosis Wound #1: Same as Pre-Procedure Plan Follow-up Appointments: Return Appointment in 1 week. - Dr. Dellia Nims Tuesday Bathing/ Shower/ Hygiene: May shower with protection but do not get wound dressing(s) wet. Edema Control - Lymphedema / SCD / Other: Elevate legs to the level of the heart or above for 30 minutes daily and/or when sitting, a frequency of: - 3-4 times per day Avoid standing for  long periods of time. Exercise regularly WOUND #1: - Lower Leg Wound Laterality: Right, Posterior Cleanser: Normal Saline (Generic) 1 x Per Week/30 Days Discharge Instructions: Cleanse the wound with Normal Saline prior to applying a clean dressing using gauze sponges, not tissue or cotton balls. Cleanser: Soap and Water 1 x Per Week/30 Days Discharge Instructions: May shower and wash wound with dial antibacterial soap and water prior to dressing change. Peri-Wound Care: Triamcinolone 15 (g) 1 x Per Week/30 Days Discharge Instructions: Use triamcinolone apply in Peri-Wound Care: Zinc Oxide Ointment 30g tube 1 x Per Week/30 Days Discharge Instructions: Apply Zinc Oxide to periwound with each dressing change Peri-Wound Care: Sween Lotion (Moisturizing lotion) 1 x Per Week/30 Days Discharge Instructions: Apply moisturizing lotion as directed Prim Dressing: Hydrofera Blue Classic Foam, 4x4 in (Generic) 1 x Per Week/30 Days ary Discharge Instructions: Moisten with saline prior to applying to wound bed Secondary Dressing: ABD Pad, 8x10 1 x Per Week/30 Days Discharge Instructions: Apply over primary dressing as directed. Com pression Wrap: ThreePress (3 layer compression wrap) 1 x Per Week/30 Days Discharge Instructions: Apply three layer compression as directed. 1. Continue with Hydrofera Blue ABDs under 3 layer compression 2. Not sure why we are using 3 layer I will need to explore that next week. 3. He is using his compression pumps twice a day but says that they are about 61 years old. We are  trying to get replacement pumps through [facility]. 4. Part of the problem here is probably the degree of central venous obstruction he has. This was addressed at [facility] some years ago but I have been unable to get him into their clinic to look at this. They claim he was noncompliant with their suggestions predominantly about bariatric referral Electronic Signature(s) Signed: 09/16/2020 5:13:37 PM By: Wayne Ham MD Signed: 09/16/2020 5:13:37 PM By: Wayne Ham MD Entered By: Wayne Pratt on 09/16/2020 17:11:08 -------------------------------------------------------------------------------- SuperBill Details Patient Name: Date of Service: Wayne Pratt, [place] W. 09/16/2020 Medical Record Number: [id number] Patient Account Number: [id number] Date of Birth/Sex: Treating RN: [birth year]Pratt (60 y.o. 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 Facility Procedures CPT4 Code: [id number] Description: [id number] - DEBRIDE WOUND 1ST 20 SQ CM OR < ICD-10 Diagnosis Description L97.218 Non-pressure chronic ulcer of right calf with other specified severity Modifier: Quantity: 1 Physician Procedures : CPT4 Code Description Modifier [id number] - WC PHYS LEVEL 3 - EST PT ICD-10 Diagnosis Description E11.51 Type 2 diabetes mellitus with diabetic peripheral angiopathy without gangrene L97.218 Non-pressure chronic ulcer of right calf with other  specified severity Quantity: 1 : [id number] - WC PHYS DEBR WO ANESTH 20 SQ CM ICD-10 Diagnosis Description [id number] Non-pressure chronic ulcer of right calf with other specified severity Quantity: 1 Electronic  Signature(s) Signed: 09/16/2020 5:13:37 PM By: Wayne Ham MD Entered By: Wayne Pratt on 09/16/2020 17:11:26

## 2020-09-18 NOTE — Progress Notes (Signed)
KUTLER, VANVRANKEN ([id number]) Visit Report for 09/16/2020 Arrival Information Details Patient Name: Date of Service: Wayne Pratt. 09/16/2020 2:30 PM Medical Record Number: [id number] Patient Account Number: [id number] Date of Birth/Sex: Treating RN: 11-23-1960 (60 y.o. [name], [name] Primary Care Caiya Bettes: [name] Other Clinician: Referring Heba Ige: Treating Yamilette Garretson/Extender: Fredderick Severance Weeks in Treatment: 10 Visit Information History Since Last Visit Added or deleted any medications: No Patient Arrived: Ambulatory Any new allergies or adverse reactions: No Arrival Time: 15:20 Had a fall or experienced change in No Accompanied By: wife activities of daily living that may affect Transfer Assistance: None risk of falls: Patient Identification Verified: Yes Signs or symptoms of abuse/neglect since last visito No Secondary Verification Process Completed: Yes Hospitalized since last visit: No Patient Requires Transmission-Based Precautions: No Implantable device outside of the clinic excluding No Patient Has Alerts: Yes cellular tissue based products placed in the center Patient Alerts: Patient on Blood Thinner since last visit: ABI's: R:1.05 03/22 Has Dressing in Place as Prescribed: Yes Pain Present Now: Yes Electronic Signature(s) Signed: 09/18/2020 11:10:38 AM By: Sandre Kitty Entered By: Sandre Kitty on 09/16/2020 15:21:13 -------------------------------------------------------------------------------- Compression Therapy Details Patient Name: Date of Service: Wayne Pratt, Wayne RY W. 09/16/2020 2:30 PM Medical Record Number: [id number] Patient Account Number: [id number] Date of Birth/Sex: Treating RN: June 25, 1960 (60 y.o. [name] Primary Care Keyondra Lagrand: [name] Other Clinician: Referring Adaya Garmany: Treating Ellanore Vanhook/Extender: Fredderick Severance Weeks in Treatment: 10 Compression Therapy Performed for Wound  Assessment: Wound #1 Right,Posterior Lower Leg Performed By: Clinician [name], RN Compression Type: Three Layer Post Procedure Diagnosis Same as Pre-procedure Electronic Signature(s) Signed: 09/16/2020 5:22:11 PM By: [name] RN Entered By: [name] on 09/16/2020 15:59:06 -------------------------------------------------------------------------------- Encounter Discharge Information Details Patient Name: Date of Service: Wayne Pratt, Wayne RY W. 09/16/2020 2:30 PM Medical Record Number: [id number] Patient Account Number: [id number] Date of Birth/Sex: Treating RN: 06/01/1960 (60 y.o. [name], [name] Primary Care Aolanis Crispen: [name] Other Clinician: Referring Jametta Moorehead: Treating Nayla Dias/Extender: Fredderick Severance Weeks in Treatment: 10 Encounter Discharge Information Items Post Procedure Vitals Discharge Condition: Stable Temperature (F): 97.7 Ambulatory Status: Ambulatory Pulse (bpm): 74 Discharge Destination: Home Respiratory Rate (breaths/min): 17 Transportation: Private Auto Blood Pressure (mmHg): 130/74 Accompanied By: wifey Schedule Follow-up Appointment: Yes Clinical Summary of Care: Patient Declined Electronic Signature(s) Signed: 09/16/2020 5:22:11 PM By: [name] RN Entered By: [name] on 09/16/2020 17:03:05 -------------------------------------------------------------------------------- Lower Extremity Assessment Details Patient Name: Date of Service: Wayne Pratt, Wayne RY W. 09/16/2020 2:30 PM Medical Record Number: [id number] Patient Account Number: [id number] Date of Birth/Sex: Treating RN: 03-Jun-1960 (60 y.o. [name], [name] Primary Care Alizae Bechtel: [name] Other Clinician: Referring Stela Iwasaki: Treating Khaleesi Gruel/Extender: Fredderick Severance Weeks in Treatment: 10 Edema Assessment Assessed: [Left: No] [Right: No] Edema: [Left: Ye] [Right: s] Calf Left: Right: Point of  Measurement: 34 cm From Medial Instep 44 cm Ankle Left: Right: Point of Measurement: 9 cm From Medial Instep 26 cm Vascular Assessment Pulses: Dorsalis Pedis Palpable: [Right:Yes] Posterior Tibial Palpable: [Right:Yes] Electronic Signature(s) Signed: 09/16/2020 5:22:11 PM By: [name] RN Entered By: [name] on 09/16/2020 15:44:03 -------------------------------------------------------------------------------- Multi Wound Chart Details Patient Name: Date of Service: Wayne Pratt, Wayne RY W. 09/16/2020 2:30 PM Medical Record Number: [id number] Patient Account Number: [id number] Date of Birth/Sex: Treating RN: Nov 24, 1960 (60 y.o. [name] Primary Care Dayden Viverette: [name] Other Clinician: Referring Veeda Virgo: Treating Yazmine Sorey/Extender: Fredderick Severance Weeks in Treatment: 10 Vital Signs Height(in): 73  Pulse(bpm): 87 Weight(lbs): 215 Blood Pressure(mmHg): 146/81 Body Mass Index(BMI): 28 Temperature(F): 98.2 Respiratory Rate(breaths/min): 16 Photos: [N/A:N/A] Right, Posterior Lower Leg N/A N/A Wound Location: Surgical Injury N/A N/A Wounding Event: Diabetic Wound/Ulcer of the Lower N/A N/A Primary Etiology: Extremity Anemia, Asthma, Sleep Apnea, N/A N/A Comorbid History: Hypertension, Peripheral Arterial Disease, Type II Diabetes, Rheumatoid Arthritis, Neuropathy 02/04/2020 N/A N/A Date Acquired: 10 N/A N/A Weeks of Treatment: Open N/A N/A Wound Status: 1.6x0.7x0.1 N/A N/A Measurements L x W x D (cm) 0.88 N/A N/A A (cm) : rea 0.088 N/A N/A Volume (cm) : 99.40% N/A N/A % Reduction in A rea: 99.40% N/A N/A % Reduction in Volume: Grade 2 N/A N/A Classification: Medium N/A N/A Exudate A mount: Serosanguineous N/A N/A Exudate Type: red, brown N/A N/A Exudate Color: Flat and Intact N/A N/A Wound Margin: Large (67-100%) N/A N/A Granulation A mount: Red, Pink N/A N/A Granulation Quality: Small (1-33%) N/A  N/A Necrotic A mount: Fat Layer (Subcutaneous Tissue): Yes N/A N/A Exposed Structures: Fascia: No Tendon: No Muscle: No Joint: No Bone: No Medium (34-66%) N/A N/A Epithelialization: Debridement - Excisional N/A N/A Debridement: Pre-procedure Verification/Time Out 16:30 N/A N/A Taken: Lidocaine N/A N/A Pain Control: Subcutaneous, Slough N/A N/A Tissue Debrided: Skin/Subcutaneous Tissue N/A N/A Level: 1.12 N/A N/A Debridement A (sq cm): rea Curette N/A N/A Instrument: Minimum N/A N/A Bleeding: Pressure N/A N/A Hemostasis A chieved: 0 N/A N/A Procedural Pain: 0 N/A N/A Post Procedural Pain: Procedure was tolerated well N/A N/A Debridement Treatment Response: 1.6x0.7x0.1 N/A N/A Post Debridement Measurements L x W x D (cm) 0.088 N/A N/A Post Debridement Volume: (cm) Compression Therapy N/A N/A Procedures Performed: Debridement Treatment Notes Wound #1 (Lower Leg) Wound Laterality: Right, Posterior Cleanser Normal Saline Discharge Instruction: Cleanse the wound with Normal Saline prior to applying a clean dressing using gauze sponges, not tissue or cotton balls. Soap and Water Discharge Instruction: May shower and wash wound with dial antibacterial soap and water prior to dressing change. Peri-Wound Care Triamcinolone 15 (g) Discharge Instruction: Use triamcinolone apply in Zinc Oxide Ointment 30g tube Discharge Instruction: Apply Zinc Oxide to periwound with each dressing change Sween Lotion (Moisturizing lotion) Discharge Instruction: Apply moisturizing lotion as directed Topical Primary Dressing Hydrofera Blue Classic Foam, 4x4 in Discharge Instruction: Moisten with saline prior to applying to wound bed Secondary Dressing ABD Pad, 8x10 Discharge Instruction: Apply over primary dressing as directed. Secured With Compression Wrap ThreePress (3 layer compression wrap) Discharge Instruction: Apply three layer compression as directed. Compression  Stockings Add-Ons Electronic Signature(s) Signed: 09/16/2020 5:13:37 PM By: [name] Signed: 09/16/2020 5:22:11 PM By: [name] RN Entered By: Linton Ham on 09/16/2020 17:07:57 -------------------------------------------------------------------------------- Multi-Disciplinary Care Plan Details Patient Name: Date of Service: Wayne Pratt, [place] RY W. 09/16/2020 2:30 PM Medical Record Number: [id number] Patient Account Number: [id number] Date of Birth/Sex: Treating RN: 21-Aug-1960 (60 y.o. [name], [name] Primary Care Annalis Kaczmarczyk: [name] Other Clinician: Referring Sheba Whaling: Treating Klover Priestly/Extender: Christel Mormon in Treatment: [place] reviewed with physician Active Inactive Nutrition Nursing Diagnoses: Impaired glucose control: actual or potential Potential for alteratiion in Nutrition/Potential for imbalanced nutrition Goals: Patient/caregiver will maintain therapeutic glucose control Date Initiated: 07/04/2020 Target Resolution Date: 10/09/2020 Goal Status: Active Interventions: Assess HgA1c results as ordered upon admission and as needed Provide education on elevated blood sugars and impact on wound healing Provide education on nutrition Treatment Activities: Education provided on Nutrition : 08/14/2020 Patient referred to Primary Care Physician for further nutritional evaluation :  07/04/2020 Notes: Wound/Skin Impairment Nursing Diagnoses: Impaired tissue integrity Knowledge deficit related to ulceration/compromised skin integrity Goals: Patient/caregiver will verbalize understanding of skin care regimen Date Initiated: 07/04/2020 Target Resolution Date: 10/10/2020 Goal Status: Active Ulcer/skin breakdown will have a volume reduction of 30% by week 4 Date Initiated: 07/04/2020 Date Inactivated: 08/28/2020 Target Resolution Date: 08/08/2020 Goal Status: Met Interventions: Assess patient/caregiver ability to  obtain necessary supplies Assess patient/caregiver ability to perform ulcer/skin care regimen upon admission and as needed Assess ulceration(s) every visit Provide education on ulcer and skin care Treatment Activities: Skin care regimen initiated : 07/04/2020 Topical wound management initiated : 07/04/2020 Notes: Electronic Signature(s) Signed: 09/16/2020 5:22:11 PM By: [name] RN Entered By: [name] on 09/16/2020 15:44:53 -------------------------------------------------------------------------------- Pain Assessment Details Patient Name: Date of Service: Wayne Pratt, Wayne RY W. 09/16/2020 2:30 PM Medical Record Number: [id number] Patient Account Number: [id number] Date of Birth/Sex: Treating RN: [birth year] (60 y.o. [name] Primary Care Tashea Othman: [name] Other Clinician: Referring Draeden Kellman: Treating Delores Edelstein/Extender: Fredderick Severance Weeks in Treatment: 10 Active Problems Location of Pain Severity and Description of Pain Patient Has Paino Yes Site Locations Rate the pain. Rate the pain. Current Pain Level: 3 Pain Management and Medication Current Pain Management: Electronic Signature(s) Signed: 09/16/2020 5:22:11 PM By: [name] RN Signed: 09/18/2020 11:10:38 AM By: Sandre Kitty Entered By: Sandre Kitty on 09/16/2020 15:21:40 -------------------------------------------------------------------------------- Patient/Caregiver Education Details Patient Name: Date of Service: Wayne Pratt, [place] [facility] 9/6/2022andnbsp2:30 PM Medical Record Number: [id number] Patient Account Number: [id number] Date of Birth/Gender: Treating RN: Jul 04, 1960 (60 y.o. [name] Primary Care Physician: [name] Other Clinician: Referring Physician: Treating Physician/Extender: Christel Mormon in Treatment: 10 Education Assessment Education Provided To: Patient Education Topics Provided Elevated Blood  Sugar/ Impact on Healing: Methods: Explain/Verbal Responses: Reinforcements needed Nutrition: Methods: Explain/Verbal Responses: Reinforcements needed Wound/Skin Impairment: Methods: Explain/Verbal Responses: Reinforcements needed Electronic Signature(s) Signed: 09/16/2020 5:22:11 PM By: [name] RN Entered By: [name] on 09/16/2020 15:45:11 -------------------------------------------------------------------------------- Wound Assessment Details Patient Name: Date of Service: Wayne Pratt, Wayne RY W. 09/16/2020 2:30 PM Medical Record Number: [id number] Patient Account Number: [id number] Date of Birth/Sex: Treating RN: 04-23-1960 (60 y.o. [name], [name] Primary Care Delron Comer: [name] Other Clinician: Referring Raeann Offner: Treating Taquana Bartley/Extender: Fredderick Severance Weeks in Treatment: 10 Wound Status Wound Number: 1 Primary Diabetic Wound/Ulcer of the Lower Extremity Etiology: Wound Location: Right, Posterior Lower Leg Wound Open Wounding Event: Surgical Injury Status: Date Acquired: 02/04/2020 Comorbid Anemia, Asthma, Sleep Apnea, Hypertension, Peripheral Arterial Weeks Of Treatment: 10 History: Disease, Type II Diabetes, Rheumatoid Arthritis, Neuropathy Clustered Wound: No Photos Wound Measurements Length: (cm) 1.6 Width: (cm) 0.7 Depth: (cm) 0.1 Area: (cm) 0.88 Volume: (cm) 0.088 % Reduction in Area: 99.4% % Reduction in Volume: 99.4% Epithelialization: Medium (34-66%) Wound Description Classification: Grade 2 Wound Margin: Flat and Intact Exudate Amount: Medium Exudate Type: Serosanguineous Exudate Color: red, brown Foul Odor After Cleansing: No Slough/Fibrino Yes Wound Bed Granulation Amount: Large (67-100%) Exposed Structure Granulation Quality: Red, Pink Fascia Exposed: No Necrotic Amount: Small (1-33%) Fat Layer (Subcutaneous Tissue) Exposed: Yes Necrotic Quality: Adherent Slough Tendon Exposed: No Muscle  Exposed: No Joint Exposed: No Bone Exposed: No Treatment Notes Wound #1 (Lower Leg) Wound Laterality: Right, Posterior Cleanser Normal Saline Discharge Instruction: Cleanse the wound with Normal Saline prior to applying a clean dressing using gauze sponges, not tissue or cotton balls. Soap and Water Discharge Instruction: May shower and wash wound with dial antibacterial soap and water  prior to dressing change. Peri-Wound Care Triamcinolone 15 (g) Discharge Instruction: Use triamcinolone apply in Zinc Oxide Ointment 30g tube Discharge Instruction: Apply Zinc Oxide to periwound with each dressing change Sween Lotion (Moisturizing lotion) Discharge Instruction: Apply moisturizing lotion as directed Topical Primary Dressing Hydrofera Blue Classic Foam, 4x4 in Discharge Instruction: Moisten with saline prior to applying to wound bed Secondary Dressing ABD Pad, 8x10 Discharge Instruction: Apply over primary dressing as directed. Secured With Compression Wrap ThreePress (3 layer compression wrap) Discharge Instruction: Apply three layer compression as directed. Compression Stockings Add-Ons Electronic Signature(s) Signed: 09/16/2020 5:22:11 PM By: [name] RN Signed: 09/18/2020 11:10:38 AM By: Sandre Kitty Entered By: Sandre Kitty on 09/16/2020 15:26:28 -------------------------------------------------------------------------------- Vitals Details Patient Name: Date of Service: Wayne Pratt, Wayne RY W. 09/16/2020 2:30 PM Medical Record Number: [id number] Patient Account Number: [id number] Date of Birth/Sex: Treating RN: 07-05-1960 (60 y.o. [name], [name] Primary Care Rose Hippler: [name] Other Clinician: Referring Zipporah Finamore: Treating Tollie Canada/Extender: Fredderick Severance Weeks in Treatment: 10 Vital Signs Time Taken: 15:21 Temperature (F): 98.2 Height (in): 73 Pulse (bpm): 87 Weight (lbs): 215 Respiratory Rate (breaths/min): 16 Body Mass  Index (BMI): 28.4 Blood Pressure (mmHg): 146/81 Reference Range: 80 - 120 mg / dl Electronic Signature(s) Signed: 09/18/2020 11:10:38 AM By: Sandre Kitty Entered By: Sandre Kitty on 09/16/2020 15:21:28

## 2020-09-22 DIAGNOSIS — I739 Peripheral vascular disease, unspecified: Secondary | ICD-10-CM | POA: Diagnosis not present

## 2020-09-22 DIAGNOSIS — E1142 Type 2 diabetes mellitus with diabetic polyneuropathy: Secondary | ICD-10-CM | POA: Diagnosis not present

## 2020-09-22 DIAGNOSIS — Z79891 Long term (current) use of opiate analgesic: Secondary | ICD-10-CM | POA: Diagnosis not present

## 2020-09-22 DIAGNOSIS — M4722 Other spondylosis with radiculopathy, cervical region: Secondary | ICD-10-CM | POA: Diagnosis not present

## 2020-09-24 ENCOUNTER — Encounter (HOSPITAL_BASED_OUTPATIENT_CLINIC_OR_DEPARTMENT_OTHER): Payer: PPO | Admitting: Internal Medicine

## 2020-09-24 ENCOUNTER — Other Ambulatory Visit: Payer: Self-pay

## 2020-09-24 DIAGNOSIS — E11622 Type 2 diabetes mellitus with other skin ulcer: Secondary | ICD-10-CM | POA: Diagnosis not present

## 2020-09-24 DIAGNOSIS — L97212 Non-pressure chronic ulcer of right calf with fat layer exposed: Secondary | ICD-10-CM | POA: Diagnosis not present

## 2020-09-25 NOTE — Progress Notes (Signed)
Wayne Pratt, Wayne Pratt ([id number]) Visit Report for 09/24/2020 Debridement Details Patient Name: Date of Service: Wayne Pratt, [place]. 09/24/2020 1:00 PM Medical Record Number: [id number] Patient Account Number: [id number] Date of Birth/Sex: Treating RN: March 19, 1960 (60 y.o. Wayne Pratt, [name] Primary Care Provider: [name] Other Clinician: Referring Provider: Treating Provider/Extender: Fredderick Severance Weeks in Treatment: 11 Debridement Performed for Assessment: Wound #1 Right,Posterior Lower Leg Performed By: Physician Ricard Dillon., MD Debridement Type: Debridement Severity of Tissue Pre Debridement: Fat layer exposed Level of Consciousness (Pre-procedure): Awake and Alert Pre-procedure Verification/Time Out Yes - 13:39 Taken: Start Time: 13:40 Pain Control: Other : Benzocaine T Area Debrided (L x W): otal 1.4 (cm) x 0.5 (cm) = 0.7 (cm) Tissue and other material debrided: Non-Viable, Slough, Subcutaneous, Slough Level: Skin/Subcutaneous Tissue Debridement Description: Excisional Instrument: Curette Bleeding: Minimum Hemostasis Achieved: Pressure End Time: 13:46 Response to Treatment: Procedure was tolerated well Level of Consciousness (Post- Awake and Alert procedure): Post Debridement Measurements of Total Wound Length: (cm) 1.4 Width: (cm) 0.5 Depth: (cm) 0.1 Volume: (cm) 0.055 Character of Wound/Ulcer Post Debridement: Stable Severity of Tissue Post Debridement: Fat layer exposed Post Procedure Diagnosis Same as Pre-procedure Electronic Signature(s) Signed: 09/24/2020 4:44:54 PM By: [name] Signed: 09/25/2020 4:34:42 PM By: [name] RN Entered By: Linton Ham on 09/24/2020 13:53:20 -------------------------------------------------------------------------------- HPI Details Patient Name: Date of Service: Wayne Pratt, Wayne RY W. 09/24/2020 1:00 PM Medical Record Number: [id number] Patient Account Number: [id number] Date of Birth/Sex:  Treating RN: 1960/09/09 (60 y.o. Wayne Pratt Primary Care Provider: [name] Other Clinician: Referring Provider: Treating Provider/Extender: Fredderick Severance Weeks in Treatment: 11 History of Present Illness HPI Description: ADMISSION 07/04/2020 This is a 60 year old woman who presented to Dr. Carlis Abbott in January of this year having critical limb ischemia. First angiogram was on 01/31/2020 showed proximal profound disease in the SFA with right foot runoff via diseased posterior tibia. He underwent a right femoral endarterectomy with a right common femoral to below-knee popliteal bypass on 02/04/2020. There were generally good vascular results however the day after the surgery according to his wife he developed a black eschar at presumably ischemic spot on the right posterior calf. He also ultimately had amputations on 02/29/2020 of the right first second and fifth toes in March she had a wound VAC on the right calf but this was too painful and was switched to wet-to-dry. At 1 point this clearly had exposed muscle and the wife was able to document this by showing me short movie on his phone. He had an operative debridement on the right calf in early April with a skin substitute oMyriad. In any case none of this really made any difference. He was hospitalized early in June with bilateral pulmonary emboli and is now on Eliquis. Long medical problem list but predominantly type 2 diabetes with peripheral neuropathy, spinal stenosis. He is a smoker Most recent arterial studies were done on 03/18/2020 on the right this showed a an ABI in the posterior tibial of 1.05 with biphasic waveforms on the left noncompressible at 1.38 but with a great toe pressure near normal at 1.11 again with triphasic waveforms. 6/29; patient arrives back in clinic. We use Santyl on the posterior calf wound on the right. Arrives in clinic with this looking a lot better. Healthy red granulated tissue  over the majority of this. 7/7 the wound is cleaned up quite nicely I think it is time to change from Santyl to [facility]. They are  using a local wrap here. There is edema in his upper lower leg. 7/21; using Hydrofera Blue. Nice improvement in surface area they are changing this every 2d 8/18; using Hydrofera Blue and bordered foam. Nice improvement in surface area 9/1; patient has some degree of lymphedema in the right leg we have not been wrapping him using Hydrofera Blue and bordered foam making decent progress in surface area. T oday he arrives with some erythema below the wound scaly skin this there is some form of contact dermatitis perhaps stasis dermatitis. I am not thinking that this is actually bacterial cellulitis as it is neither warm nor tender. The wound itself actually continues to look quite good 9/6; the patient's lymphedema in the right leg is considerably improved with compression. Also result of this the wound area is much better. We have been using Hydrofera Blue now under compression 9/14; still good edema control. Wound is smaller we have been using Hydrofera Blue for a prolonged period of time Electronic Signature(s) Signed: 09/24/2020 4:44:54 PM By: [name] Entered By: Linton Ham on 09/24/2020 13:54:01 -------------------------------------------------------------------------------- Physical Exam Details Patient Name: Date of Service: Wayne Pratt, Wayne RY W. 09/24/2020 1:00 PM Medical Record Number: [id number] Patient Account Number: [id number] Date of Birth/Sex: Treating RN: 15-Aug-1960 (60 y.o. Wayne Pratt Primary Care Provider: [name] Other Clinician: Referring Provider: Treating Provider/Extender: Fredderick Severance Weeks in Treatment: 11 Constitutional Sitting or standing Blood Pressure is within target range for patient.. Pulse regular and within target range for patient.[name] Kitchen Respirations regular, non-labored and within  target range.. Temperature is normal and within the target range for the patient.[name] Kitchen Appears in no distress. Notes Wound exam; right posterior calf. Debrided with a #3 curette to remove nonviable necrotic debris. Hemostasis with direct pressure. Into subcutaneous tissue. No evidence of surrounding infection Electronic Signature(s) Signed: 09/24/2020 4:44:54 PM By: [name] Entered By: Linton Ham on 09/24/2020 13:54:42 -------------------------------------------------------------------------------- Physician Orders Details Patient Name: Date of Service: Wayne Pratt, Wayne RY W. 09/24/2020 1:00 PM Medical Record Number: [id number] Patient Account Number: [id number] Date of Birth/Sex: Treating RN: [birth year] (60 y.o. 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 - Dr. Dellia Nims Return A Bathing/ Shower/ Hygiene May shower with protection but do not get wound dressing(s) wet. Edema Control - Lymphedema / SCD / Other Elevate legs to the level of the heart or above for 30 minutes daily and/or when sitting, a frequency of: - 3-4 times per day Avoid standing for long periods of time. Exercise regularly Wound Treatment Wound #1 - Lower Leg Wound Laterality: Right, Posterior Cleanser: Normal Saline (Generic) 1 x Per Week/30 Days Discharge Instructions: Cleanse the wound with Normal Saline prior to applying a clean dressing using gauze sponges, not tissue or cotton balls. Cleanser: Soap and Water 1 x Per Week/30 Days Discharge Instructions: May shower and wash wound with dial  antibacterial soap and water prior to dressing change. Peri-Wound Care: Triamcinolone 15 (g) 1 x Per Week/30 Days Discharge Instructions: Use triamcinolone apply in Peri-Wound Care: Zinc Oxide Ointment 30g tube 1 x Per Week/30 Days Discharge Instructions: Apply Zinc Oxide to periwound with each dressing change Peri-Wound Care: Sween Lotion (Moisturizing lotion) 1  x Per Week/30 Days Discharge Instructions: Apply moisturizing lotion as directed Prim Dressing: IODOFLEX 0.9% Cadexomer Iodine Pad 4x6 cm 1 x Per Week/30 Days ary Discharge Instructions: Apply to wound bed as instructed Secondary Dressing: ABD Pad, 8x10 1 x Per Week/30 Days Discharge Instructions: Apply over primary dressing as directed. Compression Wrap: ThreePress (3 layer compression wrap) 1 x Per Week/30 Days Discharge Instructions: Apply three layer compression as directed. Electronic Signature(s) Signed: 09/24/2020 4:44:54 PM By: [name] Signed: 09/24/2020 5:11:07 PM By: Lorrin Jackson Entered By: Lorrin Jackson on 09/24/2020 13:52:08 -------------------------------------------------------------------------------- Problem List Details Patient Name: Date of Service: Wayne Pratt, Wayne RY W. 09/24/2020 1:00 PM Medical Record Number: [id number] Patient Account Number: [id number] Date of Birth/Sex: Treating RN: [birth year] (60 y.o. 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 09/24/2020 1:00 PM Medical Record Number: [id number] Patient Account Number: [id number] Date of Birth/Sex: Treating RN: [birth year] (60 y.o. Wayne Pratt Primary Care Provider: [name] Other Clinician: Referring Provider: Treating Provider/Extender: Fredderick Severance Weeks in Treatment: 11 Subjective History of Present Illness (HPI) ADMISSION 07/04/2020 This is a 60 year old woman who presented to Dr. Carlis Abbott in January of this year having critical limb ischemia. First angiogram was on 01/31/2020 showed proximal profound disease in the SFA with right foot runoff via diseased posterior tibia. He underwent a right femoral endarterectomy with a right common femoral to below-knee popliteal bypass on 02/04/2020. There were generally good vascular results however the day after the surgery according to his wife he developed a black eschar at presumably ischemic spot on the right posterior calf. He also ultimately had amputations on 02/29/2020 of the right first second and fifth toes in March she had a wound VAC on the right calf but this was too painful and was switched to wet-to-dry. At 1 point this clearly had exposed muscle and the wife was able to document this by showing me short movie on his phone. He had an operative debridement on the right calf in early April with a skin substitute oMyriad. In any case none of this really made any difference. He was hospitalized early in June with bilateral pulmonary emboli and is now on Eliquis. Long medical problem list but predominantly type 2 diabetes with peripheral neuropathy, spinal stenosis. He is a smoker Most recent arterial studies were done on 03/18/2020 on the right this showed a an ABI in the posterior tibial of 1.05 with biphasic waveforms on  the left noncompressible at 1.38 but with a great toe pressure near normal at 1.11 again with triphasic waveforms. 6/29; patient arrives back in clinic. We use Santyl on the posterior calf wound on the right. Arrives in clinic with this looking a lot better. Healthy red granulated tissue over the majority of this. 7/7 the wound is cleaned up quite nicely I think it is time to change from Santyl to [facility]. They are using a local wrap here. There is edema in his upper lower leg. 7/21; using Hydrofera  Blue. Nice improvement in surface area they are changing this every 2d 8/18; using Hydrofera Blue and bordered foam. Nice improvement in surface area 9/1; patient has some degree of lymphedema in the right leg we have not been wrapping him using Hydrofera Blue and bordered foam making decent progress in surface area. T oday he arrives with some erythema below the wound scaly skin this there is some form of contact dermatitis perhaps stasis dermatitis. I am not thinking that this is actually bacterial cellulitis as it is neither warm nor tender. The wound itself actually continues to look quite good 9/6; the patient's lymphedema in the right leg is considerably improved with compression. Also result of this the wound area is much better. We have been using Hydrofera Blue now under compression 9/14; still good edema control. Wound is smaller we have been using Hydrofera Blue for a prolonged period of time Objective Constitutional Sitting or standing Blood Pressure is within target range for patient.. Pulse regular and within target range for patient.[name] Kitchen Respirations regular, non-labored and within target range.. Temperature is normal and within the target range for the patient.[name] Kitchen Appears in no distress. Vitals Time Taken: 1:13 PM, Height: 73 in, Weight: 215 lbs, BMI: 28.4, Temperature: 98.4 F, Pulse: 96 bpm, Respiratory Rate: 16 breaths/min, Blood Pressure: 120/74 mmHg. General Notes: Wound exam;  right posterior calf. Debrided with a #3 curette to remove nonviable necrotic debris. Hemostasis with direct pressure. Into subcutaneous tissue. No evidence of surrounding infection Integumentary (Hair, Skin) Wound #1 status is Open. Original cause of wound was Surgical Injury. The date acquired was: 02/04/2020. The wound has been in treatment 11 weeks. The wound is located on the Right,Posterior Lower Leg. The wound measures 1.4cm length x 0.5cm width x 0.1cm depth; 0.55cm^2 area and 0.055cm^3 volume. There is Fat Layer (Subcutaneous Tissue) exposed. There is a medium amount of serosanguineous drainage noted. The wound margin is distinct with the outline attached to the wound base. There is large (67-100%) red, pink granulation within the wound bed. There is a small (1-33%) amount of necrotic tissue within the wound bed including Adherent Slough. Assessment Active Problems ICD-10 Type 2 diabetes mellitus with diabetic peripheral angiopathy without gangrene Non-pressure chronic ulcer of right calf with other specified severity Procedures Wound #1 Pre-procedure diagnosis of Wound #1 is a Diabetic Wound/Ulcer of the Lower Extremity located on the Right,Posterior Lower Leg .Severity of Tissue Pre Debridement is: Fat layer exposed. There was a Excisional Skin/Subcutaneous Tissue Debridement with a total area of 0.7 sq cm performed by Ricard Dillon., MD. With the following instrument(s): Curette to remove Non-Viable tissue/material. Material removed includes Subcutaneous Tissue and Slough and after achieving pain control using Other (Benzocaine). No specimens were taken. A time out was conducted at 13:39, prior to the start of the procedure. A Minimum amount of bleeding was controlled with Pressure. The procedure was tolerated well. Post Debridement Measurements: 1.4cm length x 0.5cm width x 0.1cm depth; 0.055cm^3 volume. Character of Wound/Ulcer Post Debridement is stable. Severity of Tissue Post  Debridement is: Fat layer exposed. Post procedure Diagnosis Wound #1: Same as Pre-Procedure Pre-procedure diagnosis of Wound #1 is a Diabetic Wound/Ulcer of the Lower Extremity located on the Right,Posterior Lower Leg . There was a Three Layer Compression Therapy Procedure by Lorrin Jackson, RN. Post procedure Diagnosis Wound #1: Same as Pre-Procedure Plan Follow-up Appointments: Return Appointment in 1 week. - Dr. Dellia Nims Bathing/ Shower/ Hygiene: May shower with protection but do not get wound dressing(s) wet. Edema Control -  Lymphedema / SCD / Other: Elevate legs to the level of the heart or above for 30 minutes daily and/or when sitting, a frequency of: - 3-4 times per day Avoid standing for long periods of time. Exercise regularly WOUND #1: - Lower Leg Wound Laterality: Right, Posterior Cleanser: Normal Saline (Generic) 1 x Per Week/30 Days Discharge Instructions: Cleanse the wound with Normal Saline prior to applying a clean dressing using gauze sponges, not tissue or cotton balls. Cleanser: Soap and Water 1 x Per Week/30 Days Discharge Instructions: May shower and wash wound with dial antibacterial soap and water prior to dressing change. Peri-Wound Care: Triamcinolone 15 (g) 1 x Per Week/30 Days Discharge Instructions: Use triamcinolone apply in Peri-Wound Care: Zinc Oxide Ointment 30g tube 1 x Per Week/30 Days Discharge Instructions: Apply Zinc Oxide to periwound with each dressing change Peri-Wound Care: Sween Lotion (Moisturizing lotion) 1 x Per Week/30 Days Discharge Instructions: Apply moisturizing lotion as directed Prim Dressing: IODOFLEX 0.9% Cadexomer Iodine Pad 4x6 cm 1 x Per Week/30 Days ary Discharge Instructions: Apply to wound bed as instructed Secondary Dressing: ABD Pad, 8x10 1 x Per Week/30 Days Discharge Instructions: Apply over primary dressing as directed. Com pression Wrap: ThreePress (3 layer compression wrap) 1 x Per Week/30 Days Discharge Instructions:  Apply three layer compression as directed. 1. Still requiring debridement. I therefore substituted Iodoflex for Hydrofera Blue hoping to make a difference in this. 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 09/24/2020 Medical Record Number: [id number] Patient Account Number: [id number] Date of Birth/Sex: Treating RN: 1960/06/28 (60 y.o. 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

## 2020-09-25 NOTE — Progress Notes (Signed)
JOVE, BEYL ([id number]) Visit Report for 09/24/2020 Arrival Information Details Patient Name: Date of Service: [name], [place]. 09/24/2020 1:00 PM Medical Record Number: [id number] Patient Account Number: [id number] Date of Birth/Sex: Treating RN: [birth year] (60 y.o. [name], [name] Primary Care Thereasa Iannello: [name] Other Clinician: Referring Adilen Pavelko: Treating Walterine Amodei/Extender: Christel Mormon in Treatment: 11 Visit Information History Since Last Visit Added or deleted any medications: No Patient Arrived: Ambulatory Any new allergies or adverse reactions: No Arrival Time: 13:12 Had a fall or experienced change in No Accompanied By: wife activities of daily living that may affect Transfer Assistance: None risk of falls: Patient Identification Verified: Yes Signs or symptoms of abuse/neglect since last visito No Secondary Verification Process Completed: Yes Hospitalized since last visit: No Patient Requires Transmission-Based Precautions: No Implantable device outside of the clinic excluding No Patient Has Alerts: Yes cellular tissue based products placed in the center Patient Alerts: Patient on Blood Thinner since last visit: ABI's: R:1.05 03/22 Has Dressing in Place as Prescribed: Yes Pain Present Now: No Electronic Signature(s) Signed: 09/24/2020 2:59:51 PM By: Sandre Kitty Entered By: Sandre Kitty on 09/24/2020 13:13:05 -------------------------------------------------------------------------------- Compression Therapy Details Patient Name: Date of Service: [name], [name] 09/24/2020 1:00 PM Medical Record Number: [id number] Patient Account Number: [id number] Date of Birth/Sex: Treating RN: 22-Oct-1960 (60 y.o. Marcheta Grammes Primary Care Sherwood Castilla: [name] Other Clinician: Referring Falisha Osment: Treating Marcellino Fidalgo/Extender: Fredderick Severance Weeks in Treatment: 11 Compression Therapy Performed for Wound  Assessment: Wound #1 Right,Posterior Lower Leg Performed By: Clinician Lorrin Jackson, RN Compression Type: Three Layer Post Procedure Diagnosis Same as Pre-procedure Electronic Signature(s) Signed: 09/24/2020 5:11:07 PM By: Lorrin Jackson Entered By: Lorrin Jackson on 09/24/2020 13:51:28 -------------------------------------------------------------------------------- Encounter Discharge Information Details Patient Name: Date of Service: [name], [name] 09/24/2020 1:00 PM Medical Record Number: [id number] Patient Account Number: [id number] Date of Birth/Sex: Treating RN: [birth year] (60 y.o. Marcheta Grammes Primary Care Arisbel Maione: [name] Other Clinician: Referring Daquawn Seelman: Treating Earvin Blazier/Extender: Christel Mormon in Treatment: 11 Encounter Discharge Information Items Post Procedure Vitals Discharge Condition: Stable Temperature (F): 98.4 Ambulatory Status: Ambulatory Pulse (bpm): 96 Discharge Destination: Home Respiratory Rate (breaths/min): 16 Transportation: Private Auto Blood Pressure (mmHg): 120/74 Accompanied By: wife Schedule Follow-up Appointment: Yes Clinical Summary of Care: Provided on 09/24/2020 Form Type Recipient Paper Patient Patient Electronic Signature(s) Signed: 09/24/2020 2:59:51 PM By: Sandre Kitty Entered By: Sandre Kitty on 09/24/2020 14:40:33 -------------------------------------------------------------------------------- Lower Extremity Assessment Details Patient Name: Date of Service: [name], [name] 09/24/2020 1:00 PM Medical Record Number: [id number] Patient Account Number: [id number] Date of Birth/Sex: Treating RN: 06/22/1960 (60 y.o. Marcheta Grammes Primary Care Tysen Roesler: [name] Other Clinician: Referring Revel Stellmach: Treating Deniya Craigo/Extender: Fredderick Severance Weeks in Treatment: 11 Edema Assessment Assessed: [Left: No] [Right: Yes] Edema: [Left: Ye] [Right:  s] Calf Left: Right: Point of Measurement: 34 cm From Medial Instep 39 cm Ankle Left: Right: Point of Measurement: 9 cm From Medial Instep 26 cm Vascular Assessment Pulses: Dorsalis Pedis Palpable: [Right:Yes] Electronic Signature(s) Signed: 09/24/2020 5:11:07 PM By: Lorrin Jackson Entered By: Lorrin Jackson on 09/24/2020 13:37:54 -------------------------------------------------------------------------------- Multi Wound Chart Details Patient Name: Date of Service: [name], [name] 09/24/2020 1:00 PM Medical Record Number: [id number] Patient Account Number: [id number] Date of Birth/Sex: Treating RN: April 21, 1960 (60 y.o. [name] Primary Care Cavon Nicolls: [name] Other Clinician: Referring Sareen Randon: Treating Kinzi Frediani/Extender: Fredderick Severance Weeks in Treatment: 11 Vital Signs Height(in): 73  Pulse(bpm): 96 Weight(lbs): 215 Blood Pressure(mmHg): 120/74 Body Mass Index(BMI): 28 Temperature(F): 98.4 Respiratory Rate(breaths/min): 16 Photos: [N/A:N/A] Right, Posterior Lower Leg N/A N/A Wound Location: Surgical Injury N/A N/A Wounding Event: Diabetic Wound/Ulcer of the Lower N/A N/A Primary Etiology: Extremity Anemia, Asthma, Sleep Apnea, N/A N/A Comorbid History: Hypertension, Peripheral Arterial Disease, Type II Diabetes, Rheumatoid Arthritis, Neuropathy 02/04/2020 N/A N/A Date Acquired: 11 N/A N/A Weeks of Treatment: Open N/A N/A Wound Status: 1.4x0.5x0.1 N/A N/A Measurements L x W x D (cm) 0.55 N/A N/A A (cm) : rea 0.055 N/A N/A Volume (cm) : 99.60% N/A N/A % Reduction in A rea: 99.60% N/A N/A % Reduction in Volume: Grade 2 N/A N/A Classification: Medium N/A N/A Exudate A mount: Serosanguineous N/A N/A Exudate Type: red, brown N/A N/A Exudate Color: Distinct, outline attached N/A N/A Wound Margin: Large (67-100%) N/A N/A Granulation A mount: Red, Pink N/A N/A Granulation Quality: Small (1-33%) N/A  N/A Necrotic A mount: Fat Layer (Subcutaneous Tissue): Yes N/A N/A Exposed Structures: Fascia: No Tendon: No Muscle: No Joint: No Bone: No Medium (34-66%) N/A N/A Epithelialization: Debridement - Excisional N/A N/A Debridement: Pre-procedure Verification/Time Out 13:39 N/A N/A Taken: Other N/A N/A Pain Control: Subcutaneous, Slough N/A N/A Tissue Debrided: Skin/Subcutaneous Tissue N/A N/A Level: 0.7 N/A N/A Debridement A (sq cm): rea Curette N/A N/A Instrument: Minimum N/A N/A Bleeding: Pressure N/A N/A Hemostasis A chieved: Procedure was tolerated well N/A N/A Debridement Treatment Response: 1.4x0.5x0.1 N/A N/A Post Debridement Measurements L x W x D (cm) 0.055 N/A N/A Post Debridement Volume: (cm) Compression Therapy N/A N/A Procedures Performed: Debridement Treatment Notes Electronic Signature(s) Signed: 09/24/2020 4:44:54 PM By: [name] Signed: 09/25/2020 4:34:42 PM By: [name] RN Entered By: Linton Ham on 09/24/2020 13:53:09 -------------------------------------------------------------------------------- Multi-Disciplinary Care Plan Details Patient Name: Date of Service: [name], [name] 09/24/2020 1:00 PM Medical Record Number: [id number] Patient Account Number: [id number] Date of Birth/Sex: Treating RN: 1960-11-27 (60 y.o. Marcheta Grammes Primary Care Roizy Harold: [name] Other Clinician: Referring Doneta Bayman: Treating Servando Kyllonen/Extender: Christel Mormon in Treatment: Lambert reviewed with physician Active Inactive Nutrition Nursing Diagnoses: Impaired glucose control: actual or potential Potential for alteratiion in Nutrition/Potential for imbalanced nutrition Goals: Patient/caregiver will maintain therapeutic glucose control Date Initiated: 07/04/2020 Target Resolution Date: 10/08/2020 Goal Status: Active Interventions: Assess HgA1c results as ordered upon admission  and as needed Provide education on elevated blood sugars and impact on wound healing Provide education on nutrition Treatment Activities: Education provided on Nutrition : 09/16/2020 Patient referred to Primary Care Physician for further nutritional evaluation : 07/04/2020 Notes: Wound/Skin Impairment Nursing Diagnoses: Impaired tissue integrity Knowledge deficit related to ulceration/compromised skin integrity Goals: Patient/caregiver will verbalize understanding of skin care regimen Date Initiated: 07/04/2020 Target Resolution Date: 10/08/2020 Goal Status: Active Ulcer/skin breakdown will have a volume reduction of 30% by week 4 Date Initiated: 07/04/2020 Date Inactivated: 08/28/2020 Target Resolution Date: 08/08/2020 Goal Status: Met Interventions: Assess patient/caregiver ability to obtain necessary supplies Assess patient/caregiver ability to perform ulcer/skin care regimen upon admission and as needed Assess ulceration(s) every visit Provide education on ulcer and skin care Treatment Activities: Skin care regimen initiated : 07/04/2020 Topical wound management initiated : 07/04/2020 Notes: Electronic Signature(s) Signed: 09/24/2020 5:11:07 PM By: Lorrin Jackson Signed: 09/24/2020 5:11:07 PM By: Lorrin Jackson Entered By: Lorrin Jackson on 09/24/2020 13:38:46 -------------------------------------------------------------------------------- Pain Assessment Details Patient Name: Date of Service: [name], [name] 09/24/2020 1:00 PM Medical Record Number: [id number] Patient Account Number: [id number]  Date of Birth/Sex: Treating RN: August 06, 1960 (60 y.o. [name] Primary Care Avyay Coger: [name] Other Clinician: Referring Letonia Stead: Treating Braylen Denunzio/Extender: Fredderick Severance Weeks in Treatment: 11 Active Problems Location of Pain Severity and Description of Pain Patient Has Paino No Site Locations Pain Management and Medication Current Pain  Management: Electronic Signature(s) Signed: 09/24/2020 2:59:51 PM By: Sandre Kitty Signed: 09/25/2020 4:34:42 PM By: [name] RN Entered By: Sandre Kitty on 09/24/2020 13:13:31 -------------------------------------------------------------------------------- Patient/Caregiver Education Details Patient Name: Date of Service: [name], [facility]. 9/14/2022andnbsp1:00 PM Medical Record Number: [id number] Patient Account Number: [id number] Date of Birth/Gender: Treating RN: 14-Dec-1960 (60 y.o. Marcheta Grammes Primary Care Physician: [name] Other Clinician: Referring Physician: Treating Physician/Extender: Christel Mormon in Treatment: 11 Education Assessment Education Provided To: Patient Education Topics Provided Elevated Blood Sugar/ Impact on Healing: Methods: Explain/Verbal, Printed Responses: State content correctly Venous: Methods: Explain/Verbal Responses: State content correctly Wound/Skin Impairment: Methods: Explain/Verbal, Printed Responses: State content correctly Electronic Signature(s) Signed: 09/24/2020 5:11:07 PM By: Lorrin Jackson Entered By: Lorrin Jackson on 09/24/2020 13:39:26 -------------------------------------------------------------------------------- Wound Assessment Details Patient Name: Date of Service: [name], [name] 09/24/2020 1:00 PM Medical Record Number: [id number] Patient Account Number: [id number] Date of Birth/Sex: Treating RN: [birth year] (60 y.o. [name], [name] Primary Care Shawnee Gambone: [name] Other Clinician: Referring Jaionna Weisse: Treating Can Lucci/Extender: Fredderick Severance Weeks in Treatment: 11 Wound Status Wound Number: 1 Primary Diabetic Wound/Ulcer of the Lower Extremity Etiology: Wound Location: Right, Posterior Lower Leg Wound Open Wounding Event: Surgical Injury Status: Date Acquired: 02/04/2020 Comorbid Anemia, Asthma, Sleep Apnea, Hypertension,  Peripheral Arterial Weeks Of Treatment: 11 History: Disease, Type II Diabetes, Rheumatoid Arthritis, Neuropathy Clustered Wound: No Photos Wound Measurements Length: (cm) 1.4 Width: (cm) 0.5 Depth: (cm) 0.1 Area: (cm) 0.55 Volume: (cm) 0.055 % Reduction in Area: 99.6% % Reduction in Volume: 99.6% Epithelialization: Medium (34-66%) Wound Description Classification: Grade 2 Wound Margin: Distinct, outline attached Exudate Amount: Medium Exudate Type: Serosanguineous Exudate Color: red, brown Foul Odor After Cleansing: No Slough/Fibrino Yes Wound Bed Granulation Amount: Large (67-100%) Exposed Structure Granulation Quality: Red, Pink Fascia Exposed: No Necrotic Amount: Small (1-33%) Fat Layer (Subcutaneous Tissue) Exposed: Yes Necrotic Quality: Adherent Slough Tendon Exposed: No Muscle Exposed: No Joint Exposed: No Bone Exposed: No Treatment Notes Wound #1 (Lower Leg) Wound Laterality: Right, Posterior Cleanser Normal Saline Discharge Instruction: Cleanse the wound with Normal Saline prior to applying a clean dressing using gauze sponges, not tissue or cotton balls. Soap and Water Discharge Instruction: May shower and wash wound with dial antibacterial soap and water prior to dressing change. Peri-Wound Care Triamcinolone 15 (g) Discharge Instruction: Use triamcinolone apply in Zinc Oxide Ointment 30g tube Discharge Instruction: Apply Zinc Oxide to periwound with each dressing change Sween Lotion (Moisturizing lotion) Discharge Instruction: Apply moisturizing lotion as directed Topical Primary Dressing IODOFLEX 0.9% Cadexomer Iodine Pad 4x6 cm Discharge Instruction: Apply to wound bed as instructed Secondary Dressing ABD Pad, 8x10 Discharge Instruction: Apply over primary dressing as directed. Secured With Compression Wrap ThreePress (3 layer compression wrap) Discharge Instruction: Apply three layer compression as directed. Compression  Stockings Add-Ons Electronic Signature(s) Signed: 09/24/2020 5:11:07 PM By: Lorrin Jackson Signed: 09/25/2020 4:34:42 PM By: [name] RN Entered By: Lorrin Jackson on 09/24/2020 13:36:56 -------------------------------------------------------------------------------- Vitals Details Patient Name: Date of Service: [name], [name] 09/24/2020 1:00 PM Medical Record Number: [id number] Patient Account Number: [id number] Date of Birth/Sex: Treating RN: Dec 04, 1960 (60 y.o. [name] Primary Care Cherrill Scrima:  [name] Other Clinician: Referring Tallia Moehring: Treating Maurie Olesen/Extender: Fredderick Severance Weeks in Treatment: 11 Vital Signs Time Taken: 13:13 Temperature (F): 98.4 Height (in): 73 Pulse (bpm): 96 Weight (lbs): 215 Respiratory Rate (breaths/min): 16 Body Mass Index (BMI): 28.4 Blood Pressure (mmHg): 120/74 Reference Range: 80 - 120 mg / dl Electronic Signature(s) Signed: 09/24/2020 2:59:51 PM By: Sandre Kitty Entered By: Sandre Kitty on 09/24/2020 13:13:25

## 2020-09-30 ENCOUNTER — Other Ambulatory Visit: Payer: Self-pay

## 2020-09-30 ENCOUNTER — Encounter (HOSPITAL_BASED_OUTPATIENT_CLINIC_OR_DEPARTMENT_OTHER): Payer: PPO | Admitting: Internal Medicine

## 2020-09-30 DIAGNOSIS — E11622 Type 2 diabetes mellitus with other skin ulcer: Secondary | ICD-10-CM | POA: Diagnosis not present

## 2020-09-30 DIAGNOSIS — L97212 Non-pressure chronic ulcer of right calf with fat layer exposed: Secondary | ICD-10-CM | POA: Diagnosis not present

## 2020-09-30 DIAGNOSIS — T8189XA Other complications of procedures, not elsewhere classified, initial encounter: Secondary | ICD-10-CM | POA: Diagnosis not present

## 2020-09-30 NOTE — Progress Notes (Signed)
Wayne Pratt ([id number]) Visit Report for 09/30/2020 HPI Details Patient Name: Date of Service: Wayne Pratt. 09/30/2020 3:00 PM Medical Record Number: [id number] Patient Account Number: [id number] Date of Birth/Sex: Treating RN: 1960/05/01 (60 y.o. Wayne Pratt Other Clinician: Referring Pratt: Treating Pratt/Extender: Wayne Pratt in Treatment: 12 History of Present Illness HPI Description: ADMISSION 07/04/2020 This is a 60 year old woman who presented to Dr. Carlis Abbott in January of this year having critical limb ischemia. First angiogram was on 01/31/2020 showed proximal profound disease in the SFA with right foot runoff via diseased posterior tibia. He underwent a right femoral endarterectomy with a right common femoral to below-knee popliteal bypass on 02/04/2020. There were generally good vascular results however the day after the surgery according to his wife he developed a black eschar at presumably ischemic spot on the right posterior calf. He also ultimately had amputations on 02/29/2020 of the right first second and fifth toes in March she had a wound VAC on the right calf but this was too painful and was switched to wet-to-dry. At 1 point this clearly had exposed muscle and the wife was able to document this by showing me short movie on his phone. He had an operative debridement on the right calf in early April with a skin substitute oMyriad. In any case none of this really made any difference. He was hospitalized early in June with bilateral pulmonary emboli and is now on Eliquis. Long medical problem list but predominantly type 2 diabetes with peripheral neuropathy, spinal stenosis. He is a smoker Most recent arterial studies were done on 03/18/2020 on the right this showed a an ABI in the posterior tibial of 1.05 with biphasic waveforms on the left noncompressible at 1.38 but with a great toe pressure near  normal at 1.11 again with triphasic waveforms. 6/29; patient arrives back in clinic. We use Santyl on the posterior calf wound on the right. Arrives in clinic with this looking a lot better. Healthy red granulated tissue over the majority of this. 7/7 the wound is cleaned up quite nicely I think it is time to change from Santyl to [facility]. They are using a local wrap here. There is edema in his upper lower leg. 7/21; using Hydrofera Blue. Nice improvement in surface area they are changing this every 2d 8/18; using Hydrofera Blue and bordered foam. Nice improvement in surface area 9/1; patient has some degree of lymphedema in the right leg we have not been wrapping him using Hydrofera Blue and bordered foam making decent progress in surface area. T oday he arrives with some erythema below the wound scaly skin this there is some form of contact dermatitis perhaps stasis dermatitis. I am not thinking that this is actually bacterial cellulitis as it is neither warm nor tender. The wound itself actually continues to look quite good 9/6; the patient's lymphedema in the right leg is considerably improved with compression. Also result of this the wound area is much better. We have been using Hydrofera Blue now under compression 9/14; still good edema control. Wound is smaller we have been using Hydrofera Blue for a prolonged period of time 9/20; still good edema control. Wound is slightly smaller but with a much healthier surface I changed to Iodoflex last week still under compression Electronic Signature(s) Signed: 09/30/2020 4:30:53 PM By: [name] Entered By: Linton Ham on 09/30/2020 16:21:36 -------------------------------------------------------------------------------- Physical Exam Details Patient Name: Date of Service: [name],  [name] 09/30/2020 3:00 PM Medical Record Number: [id number] Patient Account Number: [id number] Date of Birth/Sex: Treating RN: 1960-03-05 (60 y.o. Wayne Pratt Other Clinician: Referring Pratt: Treating Pratt/Extender: Wayne Pratt in Treatment: 12 Constitutional Sitting or standing Blood Pressure is within target range for patient.. Pulse regular and within target range for patient.[name] Kitchen Respirations regular, non-labored and within target range.. Temperature is normal and within the target range for the patient.[name] Kitchen Appears in no distress. Cardiovascular Pedal pulses are palpable. Good edema control. Notes Wound exam; right posterior calf, better looking wound surface today. No need for debridement Electronic Signature(s) Signed: 09/30/2020 4:30:53 PM By: [name] Entered By: Linton Ham on 09/30/2020 16:22:32 -------------------------------------------------------------------------------- Physician Orders Details Patient Name: Date of Service: [name], [name] 09/30/2020 3:00 PM Medical Record Number: [id number] Patient Account Number: [id number] Date of Birth/Sex: Treating RN: Jan 16, 1960 (60 y.o. 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 - Dr. Dellia Nims Return A Bathing/ Shower/ Hygiene May shower with protection but do not get wound dressing(s) wet. Edema Control - Lymphedema / SCD / Other Elevate legs to the level of the heart or above for 30 minutes daily and/or when sitting, a frequency of: - 3-4 times per day Avoid standing for long periods of time. Exercise regularly Wound Treatment Wound #1 - Lower Leg Wound Laterality:  Right, Posterior Cleanser: Normal Saline (Generic) 1 x Per Week/30 Days Discharge Instructions: Cleanse the wound with Normal Saline prior to applying a clean dressing using gauze sponges, not tissue or cotton balls. Cleanser: Soap and Water 1 x Per Week/30 Days Discharge Instructions: May shower and wash wound with dial antibacterial soap and water prior to dressing change. Peri-Wound Care: Triamcinolone 15 (g) 1 x Per Week/30 Days Discharge Instructions: Use triamcinolone apply in Peri-Wound Care: Zinc Oxide Ointment 30g tube 1 x Per Week/30 Days Discharge Instructions: Apply Zinc Oxide to periwound with each dressing change Peri-Wound Care: Sween Lotion (Moisturizing lotion) 1 x Per Week/30 Days Discharge Instructions: Apply moisturizing lotion as directed Prim Dressing: IODOFLEX 0.9% Cadexomer Iodine Pad 4x6 cm 1 x Per Week/30 Days ary Discharge Instructions: Apply to wound bed as instructed Secondary Dressing: ABD Pad, 8x10 1 x Per Week/30 Days Discharge Instructions: Apply over primary dressing as directed. Compression Wrap: ThreePress (3 layer compression wrap) 1 x Per Week/30 Days Discharge Instructions: Apply three layer compression as directed. Electronic Signature(s) Signed: 09/30/2020 4:30:53 PM By: [name] Signed: 09/30/2020 5:09:48 PM By: Lorrin Jackson Entered By: Lorrin Jackson on 09/30/2020 16:14:03 -------------------------------------------------------------------------------- Problem List Details Patient Name: Date of Service: [name], [name] 09/30/2020 3:00 PM Medical Record Number: [id number] Patient Account Number: [id number] Date of Birth/Sex: Treating RN: 11-09-1960 (60 y.o. 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 09/30/2020 3:00 PM Medical Record Number: [id number] Patient Account Number: [id number] Date of Birth/Sex: Treating RN: 10-21-1960 (60 y.o. Wayne Pratt Other Clinician: Referring Pratt: Treating Pratt/Extender: Wayne Pratt in Treatment: 12 Subjective History of Present Illness (HPI) ADMISSION 07/04/2020 This is a 60 year old woman who presented to Dr. Carlis Abbott in January of this year having critical limb ischemia. First angiogram was on 01/31/2020 showed proximal profound disease in the SFA with right foot runoff via diseased posterior tibia. He underwent a right femoral endarterectomy with a right common femoral to below-knee popliteal bypass on 02/04/2020. There were generally good vascular results however the day after the surgery according to his wife he developed a black eschar at presumably ischemic spot on the right posterior calf. He also ultimately had amputations on 02/29/2020 of the right first second and fifth toes in March she had a wound VAC on the right calf but this was too painful and was switched to wet-to-dry. At 1 point this clearly had exposed muscle and the wife was able to document this by showing me short movie on his phone. He had an operative debridement on the right calf in early April with a skin substitute oMyriad. In any case none of this really made any difference. He was hospitalized early in  June with bilateral pulmonary emboli and is now on Eliquis. Long medical problem list but predominantly type 2 diabetes with peripheral neuropathy, spinal stenosis. He is a smoker Most recent arterial studies were done on 03/18/2020 on the right this showed a an ABI in the posterior tibial of 1.05 with biphasic waveforms on the left noncompressible at 1.38 but with a great toe pressure near normal at 1.11 again with triphasic waveforms. 6/29; patient arrives back in clinic. We use Santyl on the posterior calf wound on the right. Arrives in clinic with this looking a lot better. Healthy red granulated tissue over the majority of this. 7/7 the wound is cleaned up quite nicely I think it is time to change from Santyl to [facility]. They are using a local wrap here. There is edema in his upper lower leg. 7/21; using Hydrofera Blue. Nice improvement in surface area they are changing this every 2d 8/18; using Hydrofera Blue and bordered foam. Nice improvement in surface area 9/1; patient has some degree of lymphedema in the right leg we have not been wrapping him using Hydrofera Blue and bordered foam making decent progress in surface area. T oday he arrives with some erythema below the wound scaly skin this there is some form of contact dermatitis perhaps stasis dermatitis. I am not thinking that this is actually bacterial cellulitis as it is neither warm nor tender. The wound itself actually continues to look quite good 9/6; the patient's lymphedema in the right leg is considerably improved with compression. Also result of this the wound area is much better. We have been using Hydrofera Blue now under compression 9/14; still good edema control. Wound is smaller we have been using Hydrofera Blue for a prolonged period of time 9/20; still good edema control. Wound is slightly smaller but with a much healthier surface I changed to Iodoflex last week still under  compression Objective Constitutional Sitting or standing Blood Pressure is within target range for patient.. Pulse regular and within target range for patient.[name] Kitchen Respirations regular, non-labored and  within target range.. Temperature is normal and within the target range for the patient.[name] Kitchen Appears in no distress. Vitals Time Taken: 3:51 PM, Height: 73 in, Weight: 215 lbs, BMI: 28.4, Temperature: 98.4 F, Pulse: 96 bpm, Respiratory Rate: 16 breaths/min, Blood Pressure: 144/87 mmHg. Cardiovascular Pedal pulses are palpable. Good edema control. General Notes: Wound exam; right posterior calf, better looking wound surface today. No need for debridement Integumentary (Hair, Skin) Wound #1 status is Open. Original cause of wound was Surgical Injury. The date acquired was: 02/04/2020. The wound has been in treatment 12 Pratt. The wound is located on the Right,Posterior Lower Leg. The wound measures 1.2cm length x 0.5cm width x 0.1cm depth; 0.471cm^2 area and 0.047cm^3 volume. There is Fat Layer (Subcutaneous Tissue) exposed. There is no tunneling or undermining noted. There is a medium amount of serosanguineous drainage noted. The wound margin is distinct with the outline attached to the wound base. There is large (67-100%) red, pink granulation within the wound bed. There is a small (1-33%) amount of necrotic tissue within the wound bed including Adherent Slough. Assessment Active Problems ICD-10 Type 2 diabetes mellitus with diabetic peripheral angiopathy without gangrene Non-pressure chronic ulcer of right calf with other specified severity Procedures Wound #1 Pre-procedure diagnosis of Wound #1 is a Diabetic Wound/Ulcer of the Lower Extremity located on the Right,Posterior Lower Leg . There was a Three Layer Compression Therapy Procedure by Lorrin Jackson, RN. Post procedure Diagnosis Wound #1: Same as Pre-Procedure Plan Follow-up Appointments: Return Appointment in 1 week. - Dr.  Dellia Nims Bathing/ Shower/ Hygiene: May shower with protection but do not get wound dressing(s) wet. Edema Control - Lymphedema / SCD / Other: Elevate legs to the level of the heart or above for 30 minutes daily and/or when sitting, a frequency of: - 3-4 times per day Avoid standing for long periods of time. Exercise regularly WOUND #1: - Lower Leg Wound Laterality: Right, Posterior Cleanser: Normal Saline (Generic) 1 x Per Week/30 Days Discharge Instructions: Cleanse the wound with Normal Saline prior to applying a clean dressing using gauze sponges, not tissue or cotton balls. Cleanser: Soap and Water 1 x Per Week/30 Days Discharge Instructions: May shower and wash wound with dial antibacterial soap and water prior to dressing change. Peri-Wound Care: Triamcinolone 15 (g) 1 x Per Week/30 Days Discharge Instructions: Use triamcinolone apply in Peri-Wound Care: Zinc Oxide Ointment 30g tube 1 x Per Week/30 Days Discharge Instructions: Apply Zinc Oxide to periwound with each dressing change Peri-Wound Care: Sween Lotion (Moisturizing lotion) 1 x Per Week/30 Days Discharge Instructions: Apply moisturizing lotion as directed Prim Dressing: IODOFLEX 0.9% Cadexomer Iodine Pad 4x6 cm 1 x Per Week/30 Days ary Discharge Instructions: Apply to wound bed as instructed Secondary Dressing: ABD Pad, 8x10 1 x Per Week/30 Days Discharge Instructions: Apply over primary dressing as directed. Com pression Wrap: ThreePress (3 layer compression wrap) 1 x Per Week/30 Days Discharge Instructions: Apply three layer compression as directed. 1. I am continuing with Iodoflex under compression. 2. Surface of the wound looks a lot better today hopefully improvement in dimensions next week Electronic Signature(s) Signed: 09/30/2020 4:30:53 PM By: [name] Entered By: Linton Ham on 09/30/2020 16:23:38 -------------------------------------------------------------------------------- SuperBill  Details Patient Name: Date of Service: [name], [name] 09/30/2020 Medical Record Number: [id number] Patient Account Number: [id number] Date of Birth/Sex: Treating RN: 1960-02-26 (60 y.o. 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 Facility Procedures CPT4 Code: [id number] Description: (Facility Use Only) [phone] - [facility] RT LEG ICD-10 Diagnosis Description A74.255 Non-pressure chronic ulcer of right calf with other specified severity Modifier: Quantity: 1 Physician Procedures : CPT4 Code Description Modifier [id number] - WC PHYS LEVEL 3 - EST PT ICD-10 Diagnosis Description E11.51 Type 2 diabetes mellitus with diabetic peripheral angiopathy without gangrene L97.218 Non-pressure chronic ulcer of right calf with other  specified severity Quantity: 1 Electronic Signature(s) Signed: 09/30/2020 4:30:53 PM By: [name] Entered By: Linton Ham on 09/30/2020 16:23:59

## 2020-09-30 NOTE — Progress Notes (Signed)
NIVEK, POWLEY ([id number]) Visit Report for 09/30/2020 Arrival Information Details Patient Name: Date of Service: Wayne Pratt. 09/30/2020 3:00 PM Medical Record Number: [id number] Patient Account Number: [id number] Date of Birth/Sex: Treating RN: 1960/02/04 (60 y.o. [name], [name] Primary Care Samul Mcinroy: [name] Other Clinician: Referring Jaqwon Manfred: Treating Kumiko Fishman/Extender: Christel Mormon in Treatment: 12 Visit Information History Since Last Visit Added or deleted any medications: No Patient Arrived: Ambulatory Any new allergies or adverse reactions: No Arrival Time: 15:49 Had a fall or experienced change in No Accompanied By: wife activities of daily living that may affect Transfer Assistance: None risk of falls: Patient Identification Verified: Yes Signs or symptoms of abuse/neglect since last visito No Secondary Verification Process Completed: Yes Hospitalized since last visit: No Patient Requires Transmission-Based Precautions: No Implantable device outside of the clinic excluding No Patient Has Alerts: Yes cellular tissue based products placed in the center Patient Alerts: Patient on Blood Thinner since last visit: ABI's: R:1.05 03/22 Has Dressing in Place as Prescribed: Yes Pain Present Now: No Electronic Signature(s) Signed: 09/30/2020 4:02:45 PM By: Sandre Kitty Entered By: Sandre Kitty on 09/30/2020 15:51:08 -------------------------------------------------------------------------------- Compression Therapy Details Patient Name: Date of Service: Wayne Pratt, [name] 09/30/2020 3:00 PM Medical Record Number: [id number] Patient Account Number: [id number] Date of Birth/Sex: Treating RN: 08-21-1960 (60 y.o. Wayne Pratt Primary Care Etter Royall: [name] Other Clinician: Referring Maxamillion Banas: Treating Shedrick Sarli/Extender: Fredderick Severance Weeks in Treatment: 12 Compression Therapy Performed for Wound  Assessment: Wound #1 Right,Posterior Lower Leg Performed By: Clinician Lorrin Jackson, RN Compression Type: Three Layer Post Procedure Diagnosis Same as Pre-procedure Electronic Signature(s) Signed: 09/30/2020 5:09:48 PM By: Lorrin Jackson Entered By: Lorrin Jackson on 09/30/2020 16:13:32 -------------------------------------------------------------------------------- Encounter Discharge Information Details Patient Name: Date of Service: Wayne Pratt, [name] 09/30/2020 3:00 PM Medical Record Number: [id number] Patient Account Number: [id number] Date of Birth/Sex: Treating RN: 02-15-1960 (60 y.o. Wayne Pratt Primary Care Shontez Sermon: [name] Other Clinician: Referring Zarion Oliff: Treating Sabeen Piechocki/Extender: Christel Mormon in Treatment: 12 Encounter Discharge Information Items Discharge Condition: Stable Ambulatory Status: Ambulatory Discharge Destination: Home Transportation: Private Auto Schedule Follow-up Appointment: Yes Clinical Summary of Care: Provided on 09/30/2020 Form Type Recipient Paper Patient Patient Electronic Signature(s) Signed: 09/30/2020 5:09:48 PM By: Lorrin Jackson Entered By: Lorrin Jackson on 09/30/2020 16:44:22 -------------------------------------------------------------------------------- Lower Extremity Assessment Details Patient Name: Date of Service: Wayne Pratt, [name] 09/30/2020 3:00 PM Medical Record Number: [id number] Patient Account Number: [id number] Date of Birth/Sex: Treating RN: [birth year] (60 y.o. Wayne Pratt Primary Care Johnrobert Foti: [name] Other Clinician: Referring Shadeed Colberg: Treating Lulani Bour/Extender: Fredderick Severance Weeks in Treatment: 12 Edema Assessment Assessed: [Left: No] [Right: Yes] Edema: [Left: Ye] [Right: s] Calf Left: Right: Point of Measurement: 34 cm From Medial Instep 38.5 cm Ankle Left: Right: Point of Measurement: 9 cm From Medial Instep 23 cm Vascular  Assessment Pulses: Dorsalis Pedis Palpable: [Right:Yes] Electronic Signature(s) Signed: 09/30/2020 5:09:48 PM By: Lorrin Jackson Entered By: Lorrin Jackson on 09/30/2020 16:08:15 -------------------------------------------------------------------------------- Multi Wound Chart Details Patient Name: Date of Service: Wayne Pratt, [name] 09/30/2020 3:00 PM Medical Record Number: [id number] Patient Account Number: [id number] Date of Birth/Sex: Treating RN: 1960-05-04 (60 y.o. Wayne Pratt Primary Care Winola Drum: [name] Other Clinician: Referring Eytan Carrigan: Treating Velda Wendt/Extender: Fredderick Severance Weeks in Treatment: 12 Vital Signs Height(in): 73 Pulse(bpm): 96 Weight(lbs): 215 Blood Pressure(mmHg): 144/87 Body Mass Index(BMI): 28 Temperature(F): 98.4 Respiratory Rate(breaths/min): 16 Photos: [N/A:N/A] Right, Posterior  Lower Leg N/A N/A Wound Location: Surgical Injury N/A N/A Wounding Event: Diabetic Wound/Ulcer of the Lower N/A N/A Primary Etiology: Extremity Anemia, Asthma, Sleep Apnea, N/A N/A Comorbid History: Hypertension, Peripheral Arterial Disease, Type II Diabetes, Rheumatoid Arthritis, Neuropathy 02/04/2020 N/A N/A Date Acquired: 12 N/A N/A Weeks of Treatment: Open N/A N/A Wound Status: 1.2x0.5x0.1 N/A N/A Measurements L x W x D (cm) 0.471 N/A N/A A (cm) : rea 0.047 N/A N/A Volume (cm) : 99.70% N/A N/A % Reduction in A rea: 99.70% N/A N/A % Reduction in Volume: Grade 2 N/A N/A Classification: Medium N/A N/A Exudate A mount: Serosanguineous N/A N/A Exudate Type: red, brown N/A N/A Exudate Color: Distinct, outline attached N/A N/A Wound Margin: Large (67-100%) N/A N/A Granulation A mount: Red, Pink N/A N/A Granulation Quality: Small (1-33%) N/A N/A Necrotic A mount: Fat Layer (Subcutaneous Tissue): Yes N/A N/A Exposed Structures: Fascia: No Tendon: No Muscle: No Joint: No Bone: No Large (67-100%) N/A  N/A Epithelialization: Compression Therapy N/A N/A Procedures Performed: Treatment Notes Electronic Signature(s) Signed: 09/30/2020 4:30:53 PM By: [name] Signed: 09/30/2020 6:09:57 PM By: [name] RN Entered By: Linton Ham on 09/30/2020 16:20:44 -------------------------------------------------------------------------------- Multi-Disciplinary Care Plan Details Patient Name: Date of Service: Wayne Pratt, [name] 09/30/2020 3:00 PM Medical Record Number: [id number] Patient Account Number: [id number] Date of Birth/Sex: Treating RN: November 01, 1960 (60 y.o. Wayne Pratt Primary Care Jaymari Cromie: [name] Other Clinician: Referring Khush Pasion: Treating Kelin Borum/Extender: Christel Mormon in Treatment: [place] reviewed with physician Active Inactive Nutrition Nursing Diagnoses: Impaired glucose control: actual or potential Potential for alteratiion in Nutrition/Potential for imbalanced nutrition Goals: Patient/caregiver will maintain therapeutic glucose control Date Initiated: 07/04/2020 Target Resolution Date: 10/08/2020 Goal Status: Active Interventions: Assess HgA1c results as ordered upon admission and as needed Provide education on elevated blood sugars and impact on wound healing Provide education on nutrition Treatment Activities: Education provided on Nutrition : 08/14/2020 Patient referred to Primary Care Physician for further nutritional evaluation : 07/04/2020 Notes: Wound/Skin Impairment Nursing Diagnoses: Impaired tissue integrity Knowledge deficit related to ulceration/compromised skin integrity Goals: Patient/caregiver will verbalize understanding of skin care regimen Date Initiated: 07/04/2020 Target Resolution Date: 10/08/2020 Goal Status: Active Ulcer/skin breakdown will have a volume reduction of 30% by week 4 Date Initiated: 07/04/2020 Date Inactivated: 08/28/2020 Target Resolution Date:  08/08/2020 Goal Status: Met Interventions: Assess patient/caregiver ability to obtain necessary supplies Assess patient/caregiver ability to perform ulcer/skin care regimen upon admission and as needed Assess ulceration(s) every visit Provide education on ulcer and skin care Treatment Activities: Skin care regimen initiated : 07/04/2020 Topical wound management initiated : 07/04/2020 Notes: Electronic Signature(s) Signed: 09/30/2020 5:09:48 PM By: Lorrin Jackson Entered By: Lorrin Jackson on 09/30/2020 16:08:36 -------------------------------------------------------------------------------- Pain Assessment Details Patient Name: Date of Service: Wayne Pratt, [name] 09/30/2020 3:00 PM Medical Record Number: [id number] Patient Account Number: [id number] Date of Birth/Sex: Treating RN: [birth year] (60 y.o. Wayne Pratt Primary Care Deron Poole: [name] Other Clinician: Referring Milen Lengacher: Treating Daeron Carreno/Extender: Fredderick Severance Weeks in Treatment: 12 Active Problems Location of Pain Severity and Description of Pain Patient Has Paino No Site Locations Pain Management and Medication Current Pain Management: Electronic Signature(s) Signed: 09/30/2020 4:02:45 PM By: Sandre Kitty Signed: 09/30/2020 6:09:57 PM By: [name] RN Entered By: Sandre Kitty on 09/30/2020 15:51:32 -------------------------------------------------------------------------------- Patient/Caregiver Education Details Patient Name: Date of Service: Wayne Pratt, [facility] 9/20/2022andnbsp3:00 PM Medical Record Number: [id number] Patient Account Number: [id number] Date of Birth/Gender: Treating RN: 10/11/1960 (  60 y.o. Wayne Pratt Primary Care Physician: [name] Other Clinician: Referring Physician: Treating Physician/Extender: Christel Mormon in Treatment: 12 Education Assessment Education Provided To: Patient Education Topics  Provided Venous: Methods: Explain/Verbal, Printed Responses: State content correctly Wound/Skin Impairment: Methods: Explain/Verbal, Printed Responses: State content correctly Electronic Signature(s) Signed: 09/30/2020 5:09:48 PM By: Lorrin Jackson Entered By: Lorrin Jackson on 09/30/2020 16:08:57 -------------------------------------------------------------------------------- Wound Assessment Details Patient Name: Date of Service: Wayne Pratt, [name] 09/30/2020 3:00 PM Medical Record Number: [id number] Patient Account Number: [id number] Date of Birth/Sex: Treating RN: 1960/10/11 (60 y.o. [name], [name] Primary Care Bates Collington: [name] Other Clinician: Referring Maxen Rowland: Treating Antavius Sperbeck/Extender: Fredderick Severance Weeks in Treatment: 12 Wound Status Wound Number: 1 Primary Diabetic Wound/Ulcer of the Lower Extremity Etiology: Wound Location: Right, Posterior Lower Leg Wound Open Wounding Event: Surgical Injury Status: Date Acquired: 02/04/2020 Comorbid Anemia, Asthma, Sleep Apnea, Hypertension, Peripheral Arterial Weeks Of Treatment: 12 History: Disease, Type II Diabetes, Rheumatoid Arthritis, Neuropathy Clustered Wound: No Photos Wound Measurements Length: (cm) 1.2 Width: (cm) 0.5 Depth: (cm) 0.1 Area: (cm) 0.471 Volume: (cm) 0.047 % Reduction in Area: 99.7% % Reduction in Volume: 99.7% Epithelialization: Large (67-100%) Tunneling: No Undermining: No Wound Description Classification: Grade 2 Wound Margin: Distinct, outline attached Exudate Amount: Medium Exudate Type: Serosanguineous Exudate Color: red, brown Foul Odor After Cleansing: No Slough/Fibrino Yes Wound Bed Granulation Amount: Large (67-100%) Exposed Structure Granulation Quality: Red, Pink Fascia Exposed: No Necrotic Amount: Small (1-33%) Fat Layer (Subcutaneous Tissue) Exposed: Yes Necrotic Quality: Adherent Slough Tendon Exposed: No Muscle Exposed: No Joint  Exposed: No Bone Exposed: No Treatment Notes Wound #1 (Lower Leg) Wound Laterality: Right, Posterior Cleanser Normal Saline Discharge Instruction: Cleanse the wound with Normal Saline prior to applying a clean dressing using gauze sponges, not tissue or cotton balls. Soap and Water Discharge Instruction: May shower and wash wound with dial antibacterial soap and water prior to dressing change. Peri-Wound Care Triamcinolone 15 (g) Discharge Instruction: Use triamcinolone apply in Zinc Oxide Ointment 30g tube Discharge Instruction: Apply Zinc Oxide to periwound with each dressing change Sween Lotion (Moisturizing lotion) Discharge Instruction: Apply moisturizing lotion as directed Topical Primary Dressing IODOFLEX 0.9% Cadexomer Iodine Pad 4x6 cm Discharge Instruction: Apply to wound bed as instructed Secondary Dressing ABD Pad, 8x10 Discharge Instruction: Apply over primary dressing as directed. Secured With Compression Wrap ThreePress (3 layer compression wrap) Discharge Instruction: Apply three layer compression as directed. Compression Stockings Add-Ons Electronic Signature(s) Signed: 09/30/2020 5:09:48 PM By: Lorrin Jackson Signed: 09/30/2020 6:09:57 PM By: [name] RN Previous Signature: 09/30/2020 4:02:45 PM Version By: Sandre Kitty Entered By: Lorrin Jackson on 09/30/2020 16:07:31 -------------------------------------------------------------------------------- Vitals Details Patient Name: Date of Service: Wayne Pratt, [name] 09/30/2020 3:00 PM Medical Record Number: [id number] Patient Account Number: [id number] Date of Birth/Sex: Treating RN: 23-Aug-1960 (60 y.o. [name], [name] Primary Care Mahari Strahm: [name] Other Clinician: Referring Jahkai Yandell: Treating Chakara Bognar/Extender: Fredderick Severance Weeks in Treatment: 12 Vital Signs Time Taken: 15:51 Temperature (F): 98.4 Height (in): 73 Pulse (bpm): 96 Weight (lbs): 215 Respiratory  Rate (breaths/min): 16 Body Mass Index (BMI): 28.4 Blood Pressure (mmHg): 144/87 Reference Range: 80 - 120 mg / dl Electronic Signature(s) Signed: 09/30/2020 4:02:45 PM By: Sandre Kitty Entered By: Sandre Kitty on 09/30/2020 15:51:23

## 2020-10-10 ENCOUNTER — Other Ambulatory Visit: Payer: Self-pay

## 2020-10-10 ENCOUNTER — Encounter (HOSPITAL_BASED_OUTPATIENT_CLINIC_OR_DEPARTMENT_OTHER): Payer: PPO | Admitting: Internal Medicine

## 2020-10-10 DIAGNOSIS — E11622 Type 2 diabetes mellitus with other skin ulcer: Secondary | ICD-10-CM | POA: Diagnosis not present

## 2020-10-10 DIAGNOSIS — T8189XA Other complications of procedures, not elsewhere classified, initial encounter: Secondary | ICD-10-CM | POA: Diagnosis not present

## 2020-10-10 NOTE — Progress Notes (Signed)
JERELD, PRESTI ([id number]) Visit Report for 10/10/2020 Arrival Information Details Patient Name: Date of Service: [name]. 10/10/2020 2:00 PM Medical Record Number: [id number] Patient Account Number: [id number] Date of Birth/Sex: Treating RN: 1960-12-10 (60 y.o. [name] Primary Care Nirvi Boehler: [name] Other Clinician: Referring Javis Abboud: Treating Julaine Zimny/Extender: Christel Mormon in Treatment: 14 Visit Information History Since Last Visit Added or deleted any medications: No Patient Arrived: Ambulatory Any new allergies or adverse reactions: No Arrival Time: 13:43 Had a fall or experienced change in No Accompanied By: spouse activities of daily living that may affect Transfer Assistance: None risk of falls: Patient Identification Verified: Yes Signs or symptoms of abuse/neglect since last visito No Secondary Verification Process Completed: Yes Hospitalized since last visit: No Patient Requires Transmission-Based Precautions: No Implantable device outside of the clinic excluding No Patient Has Alerts: Yes cellular tissue based products placed in the center Patient Alerts: Patient on Blood Thinner since last visit: ABI's: R:1.05 03/22 Has Dressing in Place as Prescribed: Yes Has Compression in Place as Prescribed: Yes Pain Present Now: No Electronic Signature(s) Signed: 10/10/2020 4:13:46 PM By: Baruch Gouty RN, BSN Entered By: Baruch Gouty on 10/10/2020 13:45:46 -------------------------------------------------------------------------------- Compression Therapy Details Patient Name: Date of Service: [name], [name] 10/10/2020 2:00 PM Medical Record Number: [id number] Patient Account Number: [id number] Date of Birth/Sex: Treating RN: 1960-08-09 (60 y.o. [name] Primary Care Collins Kerby: [name] Other Clinician: Referring Krislyn Donnan: Treating Derril Franek/Extender: Fredderick Severance Weeks in  Treatment: 14 Compression Therapy Performed for Wound Assessment: Wound #1 Right,Posterior Lower Leg Performed By: Clinician Baruch Gouty, RN Compression Type: Three Layer Post Procedure Diagnosis Same as Pre-procedure Electronic Signature(s) Signed: 10/10/2020 4:13:46 PM By: Baruch Gouty RN, BSN Entered By: Baruch Gouty on 10/10/2020 14:41:40 -------------------------------------------------------------------------------- Encounter Discharge Information Details Patient Name: Date of Service: [name], [name] 10/10/2020 2:00 PM Medical Record Number: [id number] Patient Account Number: [id number] Date of Birth/Sex: Treating RN: 1960/07/20 (60 y.o. [name] Primary Care Nima Bamburg: [name] Other Clinician: Referring Shital Crayton: Treating Hyde Sires/Extender: Christel Mormon in Treatment: 14 Encounter Discharge Information Items Discharge Condition: Stable Ambulatory Status: Ambulatory Discharge Destination: Home Transportation: Private Auto Accompanied By: spouse Schedule Follow-up Appointment: Yes Clinical Summary of Care: Patient Declined Electronic Signature(s) Signed: 10/10/2020 4:13:46 PM By: Baruch Gouty RN, BSN Entered By: Baruch Gouty on 10/10/2020 14:52:11 -------------------------------------------------------------------------------- Lower Extremity Assessment Details Patient Name: Date of Service: [name], [name] 10/10/2020 2:00 PM Medical Record Number: [id number] Patient Account Number: [id number] Date of Birth/Sex: Treating RN: 1960/03/09 (60 y.o. [name] Primary Care Grayson White: [name] Other Clinician: Referring Randel Hargens: Treating Keyonta Barradas/Extender: Fredderick Severance Weeks in Treatment: 14 Edema Assessment Assessed: [Left: No] [Right: No] Edema: [Left: Ye] [Right: s] Calf Left: Right: Point of Measurement: 34 cm From Medial Instep 38.5 cm Ankle Left: Right: Point of  Measurement: 9 cm From Medial Instep 23.5 cm Vascular Assessment Pulses: Dorsalis Pedis Palpable: [Right:No] Electronic Signature(s) Signed: 10/10/2020 4:13:46 PM By: Baruch Gouty RN, BSN Entered By: Baruch Gouty on 10/10/2020 13:54:02 -------------------------------------------------------------------------------- Multi Wound Chart Details Patient Name: Date of Service: [name], [name] 10/10/2020 2:00 PM Medical Record Number: [id number] Patient Account Number: [id number] Date of Birth/Sex: Treating RN: 03/22/1960 (60 y.o. [name] Primary Care Makailah Slavick: [name] Other Clinician: Referring Shelby Anderle: Treating Cabela Pacifico/Extender: Fredderick Severance Weeks in Treatment: 14 Vital Signs Height(in): 73 Pulse(bpm): 94 Weight(lbs): 215 Blood Pressure(mmHg): 128/81 Body Mass  Index(BMI): 28 Temperature(F): 98.4 Respiratory Rate(breaths/min): 18 Photos: [N/A:N/A] Right, Posterior Lower Leg N/A N/A Wound Location: Surgical Injury N/A N/A Wounding Event: Diabetic Wound/Ulcer of the Lower N/A N/A Primary Etiology: Extremity Anemia, Asthma, Sleep Apnea, N/A N/A Comorbid History: Hypertension, Peripheral Arterial Disease, Type II Diabetes, Rheumatoid Arthritis, Neuropathy 02/04/2020 N/A N/A Date Acquired: 14 N/A N/A Weeks of Treatment: Open N/A N/A Wound Status: 1x0.3x0.1 N/A N/A Measurements L x W x D (cm) 0.236 N/A N/A A (cm) : rea 0.024 N/A N/A Volume (cm) : 99.80% N/A N/A % Reduction in A rea: 99.80% N/A N/A % Reduction in Volume: Grade 2 N/A N/A Classification: Small N/A N/A Exudate A mount: Serosanguineous N/A N/A Exudate Type: red, brown N/A N/A Exudate Color: Distinct, outline attached N/A N/A Wound Margin: Large (67-100%) N/A N/A Granulation A mount: Red, Pink N/A N/A Granulation Quality: None Present (0%) N/A N/A Necrotic A mount: Fat Layer (Subcutaneous Tissue): Yes N/A N/A Exposed Structures: Fascia:  No Tendon: No Muscle: No Joint: No Bone: No Small (1-33%) N/A N/A Epithelialization: Compression Therapy N/A N/A Procedures Performed: Treatment Notes Wound #1 (Lower Leg) Wound Laterality: Right, Posterior Cleanser Normal Saline Discharge Instruction: Cleanse the wound with Normal Saline prior to applying a clean dressing using gauze sponges, not tissue or cotton balls. Soap and Water Discharge Instruction: May shower and wash wound with dial antibacterial soap and water prior to dressing change. Peri-Wound Care Sween Lotion (Moisturizing lotion) Discharge Instruction: Apply moisturizing lotion as directed Topical Primary Dressing Promogran Prisma Matrix, 4.34 (sq in) (silver collagen) Discharge Instruction: Moisten collagen with saline or hydrogel Secondary Dressing Woven Gauze Sponge, Non-Sterile 4x4 in Discharge Instruction: Apply over primary dressing as directed. Secured With Compression Wrap ThreePress (3 layer compression wrap) Discharge Instruction: Apply three layer compression as directed. Compression Stockings Add-Ons Electronic Signature(s) Signed: 10/10/2020 3:46:44 PM By: [name] Signed: 10/10/2020 4:13:46 PM By: Baruch Gouty RN, BSN Entered By: Linton Ham on 10/10/2020 34:35:68 -------------------------------------------------------------------------------- Multi-Disciplinary Care Plan Details Patient Name: Date of Service: [name], [name] 10/10/2020 2:00 PM Medical Record Number: [id number] Patient Account Number: [id number] Date of Birth/Sex: Treating RN: 30-Dec-1960 (60 y.o. [name] Primary Care Clara Herbison: [name] Other Clinician: Referring Letishia Elliott: Treating Layney Gillson/Extender: Christel Mormon in Treatment: [place] reviewed with physician Active Inactive Nutrition Nursing Diagnoses: Impaired glucose control: actual or potential Potential for alteratiion in  Nutrition/Potential for imbalanced nutrition Goals: Patient/caregiver will maintain therapeutic glucose control Date Initiated: 07/04/2020 Target Resolution Date: 11/05/2020 Goal Status: Active Interventions: Assess HgA1c results as ordered upon admission and as needed Provide education on elevated blood sugars and impact on wound healing Provide education on nutrition Treatment Activities: Education provided on Nutrition : 09/16/2020 Patient referred to Primary Care Physician for further nutritional evaluation : 07/04/2020 Notes: Wound/Skin Impairment Nursing Diagnoses: Impaired tissue integrity Knowledge deficit related to ulceration/compromised skin integrity Goals: Patient/caregiver will verbalize understanding of skin care regimen Date Initiated: 07/04/2020 Target Resolution Date: 11/05/2020 Goal Status: Active Ulcer/skin breakdown will have a volume reduction of 30% by week 4 Date Initiated: 07/04/2020 Date Inactivated: 08/28/2020 Target Resolution Date: 08/08/2020 Goal Status: Met Interventions: Assess patient/caregiver ability to obtain necessary supplies Assess patient/caregiver ability to perform ulcer/skin care regimen upon admission and as needed Assess ulceration(s) every visit Provide education on ulcer and skin care Treatment Activities: Skin care regimen initiated : 07/04/2020 Topical wound management initiated : 07/04/2020 Notes: Electronic Signature(s) Signed: 10/10/2020 4:13:46 PM By: Baruch Gouty RN, BSN Entered By: Johna Roles,  Linda on 10/10/2020 14:00:34 -------------------------------------------------------------------------------- Pain Assessment Details Patient Name: Date of Service: [name]. 10/10/2020 2:00 PM Medical Record Number: [id number] Patient Account Number: [id number] Date of Birth/Sex: Treating RN: [birth year] (60 y.o. [name] Primary Care Oliver Neuwirth: [name] Other Clinician: Referring Elexis Pollak: Treating  Taijon Vink/Extender: Fredderick Severance Weeks in Treatment: 14 Active Problems Location of Pain Severity and Description of Pain Patient Has Paino No Site Locations Rate the pain. Current Pain Level: 0 Pain Management and Medication Current Pain Management: Electronic Signature(s) Signed: 10/10/2020 4:13:46 PM By: Baruch Gouty RN, BSN Entered By: Baruch Gouty on 10/10/2020 13:48:16 -------------------------------------------------------------------------------- Patient/Caregiver Education Details Patient Name: Date of Service: [name], [facility]. 9/30/2022andnbsp2:00 PM Medical Record Number: [id number] Patient Account Number: [id number] Date of Birth/Gender: Treating RN: Feb 15, 1960 (60 y.o. [name] Primary Care Physician: [name] Other Clinician: Referring Physician: Treating Physician/Extender: Christel Mormon in Treatment: 14 Education Assessment Education Provided To: Patient Education Topics Provided Elevated Blood Sugar/ Impact on Healing: Methods: Explain/Verbal Responses: Reinforcements needed, State content correctly Wound/Skin Impairment: Methods: Explain/Verbal Responses: Reinforcements needed, State content correctly Electronic Signature(s) Signed: 10/10/2020 4:13:46 PM By: Baruch Gouty RN, BSN Entered By: Baruch Gouty on 10/10/2020 14:01:23 -------------------------------------------------------------------------------- Wound Assessment Details Patient Name: Date of Service: [name], [name] 10/10/2020 2:00 PM Medical Record Number: [id number] Patient Account Number: [id number] Date of Birth/Sex: Treating RN: 01/24/1960 (60 y.o. [name] Primary Care Teofilo Lupinacci: [name] Other Clinician: Referring Anuoluwapo Mefferd: Treating Geron Mulford/Extender: Fredderick Severance Weeks in Treatment: 14 Wound Status Wound Number: 1 Primary Diabetic Wound/Ulcer of the Lower  Extremity Etiology: Wound Location: Right, Posterior Lower Leg Wound Open Wounding Event: Surgical Injury Status: Date Acquired: 02/04/2020 Comorbid Anemia, Asthma, Sleep Apnea, Hypertension, Peripheral Arterial Weeks Of Treatment: 14 History: Disease, Type II Diabetes, Rheumatoid Arthritis, Neuropathy Clustered Wound: No Photos Wound Measurements Length: (cm) 1 Width: (cm) 0.3 Depth: (cm) 0.1 Area: (cm) 0.236 Volume: (cm) 0.024 % Reduction in Area: 99.8% % Reduction in Volume: 99.8% Epithelialization: Small (1-33%) Tunneling: No Undermining: No Wound Description Classification: Grade 2 Wound Margin: Distinct, outline attached Exudate Amount: Small Exudate Type: Serosanguineous Exudate Color: red, brown Foul Odor After Cleansing: No Slough/Fibrino No Wound Bed Granulation Amount: Large (67-100%) Exposed Structure Granulation Quality: Red, Pink Fascia Exposed: No Necrotic Amount: None Present (0%) Fat Layer (Subcutaneous Tissue) Exposed: Yes Tendon Exposed: No Muscle Exposed: No Joint Exposed: No Bone Exposed: No Treatment Notes Wound #1 (Lower Leg) Wound Laterality: Right, Posterior Cleanser Normal Saline Discharge Instruction: Cleanse the wound with Normal Saline prior to applying a clean dressing using gauze sponges, not tissue or cotton balls. Soap and Water Discharge Instruction: May shower and wash wound with dial antibacterial soap and water prior to dressing change. Peri-Wound Care Sween Lotion (Moisturizing lotion) Discharge Instruction: Apply moisturizing lotion as directed Topical Primary Dressing Promogran Prisma Matrix, 4.34 (sq in) (silver collagen) Discharge Instruction: Moisten collagen with saline or hydrogel Secondary Dressing Woven Gauze Sponge, Non-Sterile 4x4 in Discharge Instruction: Apply over primary dressing as directed. Secured With Compression Wrap ThreePress (3 layer compression wrap) Discharge Instruction: Apply three layer  compression as directed. Compression Stockings Add-Ons Electronic Signature(s) Signed: 10/10/2020 4:13:46 PM By: Baruch Gouty RN, BSN Entered By: Baruch Gouty on 10/10/2020 14:00:16 -------------------------------------------------------------------------------- Vitals Details Patient Name: Date of Service: [name], [name] 10/10/2020 2:00 PM Medical Record Number: [id number] Patient Account Number: [id number] Date of Birth/Sex: Treating RN: 1960-05-06 (60 y.o. [name] Primary Care  Eun Vermeer: [name] Other Clinician: Referring Atha Mcbain: Treating Kennedey Digilio/Extender: Fredderick Severance Weeks in Treatment: 14 Vital Signs Time Taken: 13:45 Temperature (F): 98.4 Height (in): 73 Pulse (bpm): 94 Source: Stated Respiratory Rate (breaths/min): 18 Weight (lbs): 215 Blood Pressure (mmHg): 128/81 Source: Stated Reference Range: 80 - 120 mg / dl Body Mass Index (BMI): 28.4 Electronic Signature(s) Signed: 10/10/2020 4:13:46 PM By: Baruch Gouty RN, BSN Entered By: Baruch Gouty on 10/10/2020 13:48:03

## 2020-10-10 NOTE — Progress Notes (Signed)
Wayne Pratt ([id number]) Visit Report for 10/10/2020 HPI Details Patient Name: Date of Service: Wayne Pratt. 10/10/2020 2:00 PM Medical Record Number: [id number] Patient Account Number: [id number] Date of Birth/Sex: Treating RN: 11-22-1960 (60 y.o. Wayne Pratt Primary Care Provider: [name] Other Clinician: Referring Provider: Treating Provider/Extender: Fredderick Severance Weeks in Treatment: 14 History of Present Illness HPI Description: ADMISSION 07/04/2020 This is a 60 year old woman who presented to Dr. Carlis Abbott in January of this year having critical limb ischemia. First angiogram was on 01/31/2020 showed proximal profound disease in the SFA with right foot runoff via diseased posterior tibia. He underwent a right femoral endarterectomy with a right common femoral to below-knee popliteal bypass on 02/04/2020. There were generally good vascular results however the day after the surgery according to his wife he developed a black eschar at presumably ischemic spot on the right posterior calf. He also ultimately had amputations on 02/29/2020 of the right first second and fifth toes in March she had a wound VAC on the right calf but this was too painful and was switched to wet-to-dry. At 1 point this clearly had exposed muscle and the wife was able to document this by showing me short movie on his phone. He had an operative debridement on the right calf in early April with a skin substitute oMyriad. In any case none of this really made any difference. He was hospitalized early in June with bilateral pulmonary emboli and is now on Eliquis. Long medical problem list but predominantly type 2 diabetes with peripheral neuropathy, spinal stenosis. He is a smoker Most recent arterial studies were done on 03/18/2020 on the right this showed a an ABI in the posterior tibial of 1.05 with biphasic waveforms on the left noncompressible at 1.38 but with a great toe pressure near  normal at 1.11 again with triphasic waveforms. 6/29; patient arrives back in clinic. We use Santyl on the posterior calf wound on the right. Arrives in clinic with this looking a lot better. Healthy red granulated tissue over the majority of this. 7/7 the wound is cleaned up quite nicely I think it is time to change from Santyl to [facility]. They are using a local wrap here. There is edema in his upper lower leg. 7/21; using Hydrofera Blue. Nice improvement in surface area they are changing this every 2d 8/18; using Hydrofera Blue and bordered foam. Nice improvement in surface area 9/1; patient has some degree of lymphedema in the right leg we have not been wrapping him using Hydrofera Blue and bordered foam making decent progress in surface area. T oday he arrives with some erythema below the wound scaly skin this there is some form of contact dermatitis perhaps stasis dermatitis. I am not thinking that this is actually bacterial cellulitis as it is neither warm nor tender. The wound itself actually continues to look quite good 9/6; the patient's lymphedema in the right leg is considerably improved with compression. Also result of this the wound area is much better. We have been using Hydrofera Blue now under compression 9/14; still good edema control. Wound is smaller we have been using Hydrofera Blue for a prolonged period of time 9/20; still good edema control. Wound is slightly smaller but with a much healthier surface I changed to Iodoflex last week still under compression 9/30; edema control is good. Wound is smaller very healthy surface. I changed back to silver collagen today Electronic Signature(s) Signed: 10/10/2020 3:46:44 PM By: [name]  Entered By: Linton Ham on 10/10/2020 15:11:21 -------------------------------------------------------------------------------- Physical Exam Details Patient Name: Date of Service: Wayne Pratt. 10/10/2020 2:00 PM Medical Record  Number: [id number] Patient Account Number: [id number] Date of Birth/Sex: Treating RN: Oct 11, 1960 (60 y.o. Wayne Pratt Primary Care Provider: [name] Other Clinician: Referring Provider: Treating Provider/Extender: Fredderick Severance Weeks in Treatment: 14 Constitutional Sitting or standing Blood Pressure is within target range for patient.. Pulse regular and within target range for patient.[name] Kitchen Respirations regular, non-labored and within target range.. Temperature is normal and within the target range for the patient.[name] Kitchen Appears in no distress. Notes Wound exam; right posterior lateral calf small wound healthy surface. Some depth perhaps 2 mm no surrounding erythema. Pedal pulses are palpable we have good edema control in the right leg. No debridement is required Electronic Signature(s) Signed: 10/10/2020 3:46:44 PM By: [name] Entered By: Linton Ham on 10/10/2020 15:12:07 -------------------------------------------------------------------------------- Physician Orders Details Patient Name: Date of Service: Wayne Pratt, Wayne RY W. 10/10/2020 2:00 PM Medical Record Number: [id number] Patient Account Number: [id number] Date of Birth/Sex: Treating RN: September 29, 1960 (60 y.o. Wayne Pratt Primary Care Provider: [name] Other Clinician: Referring Provider: Treating Provider/Extender: Christel Mormon in Treatment: 14 Verbal / Phone Orders: No Diagnosis Coding ICD-10 Coding Code Description E11.51 Type 2 diabetes mellitus with diabetic peripheral angiopathy without gangrene L97.218 Non-pressure chronic ulcer of right calf with other specified severity Follow-up Appointments ppointment in 1 week. - Dr. Dellia Nims Return A Bathing/ Shower/ Hygiene May shower with protection but do not get wound dressing(s) wet. Edema Control - Lymphedema / SCD / Other Elevate legs to the level of the heart or above for 30 minutes daily and/or  when sitting, a frequency of: - 3-4 times per day Avoid standing for long periods of time. Exercise regularly Wound Treatment Wound #1 - Lower Leg Wound Laterality: Right, Posterior Cleanser: Normal Saline (Generic) 1 x Per Week/30 Days Discharge Instructions: Cleanse the wound with Normal Saline prior to applying a clean dressing using gauze sponges, not tissue or cotton balls. Cleanser: Soap and Water 1 x Per Week/30 Days Discharge Instructions: May shower and wash wound with dial antibacterial soap and water prior to dressing change. Peri-Wound Care: Sween Lotion (Moisturizing lotion) 1 x Per Week/30 Days Discharge Instructions: Apply moisturizing lotion as directed Prim Dressing: Promogran Prisma Matrix, 4.34 (sq in) (silver collagen) 1 x Per Week/30 Days ary Discharge Instructions: Moisten collagen with saline or hydrogel Secondary Dressing: Woven Gauze Sponge, Non-Sterile 4x4 in 1 x Per Week/30 Days Discharge Instructions: Apply over primary dressing as directed. Compression Wrap: ThreePress (3 layer compression wrap) 1 x Per Week/30 Days Discharge Instructions: Apply three layer compression as directed. Electronic Signature(s) Signed: 10/10/2020 3:46:44 PM By: [name] Signed: 10/10/2020 4:13:46 PM By: Baruch Gouty RN, BSN Entered By: Baruch Gouty on 10/10/2020 14:42:43 -------------------------------------------------------------------------------- Problem List Details Patient Name: Date of Service: Wayne Pratt, [name] 10/10/2020 2:00 PM Medical Record Number: [id number] Patient Account Number: [id number] Date of Birth/Sex: Treating RN: 1960-08-24 (60 y.o. 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 10/10/2020 2:00 PM Medical Record Number: [id number] Patient Account Number: [id number] Date of Birth/Sex: Treating RN: March 24, 1960 (60 y.o. Wayne Pratt Primary Care Provider: [name] Other Clinician: Referring Provider: Treating Provider/Extender: Fredderick Severance Weeks in Treatment: 14 Subjective History of Present Illness (HPI) ADMISSION 07/04/2020 This is a 60 year old woman who presented to Dr. Carlis Abbott in January of this year having critical limb ischemia. First angiogram was on 01/31/2020 showed proximal profound disease in the SFA with right foot runoff via diseased posterior tibia. He underwent a right femoral endarterectomy with a right common femoral to below-knee popliteal bypass on 02/04/2020. There were generally good vascular results however the day after the surgery according to his wife he developed a black eschar at presumably ischemic spot on the right posterior calf. He also ultimately had amputations on 02/29/2020 of the right first second and fifth toes in March she had a wound VAC on the right calf but this was too painful and was switched to wet-to-dry. At 1 point this clearly had exposed muscle and the wife was able to document this by showing me short movie on his phone. He had an operative debridement on the right calf in early April with a skin substitute oMyriad. In any case none of this really made any difference. He was hospitalized early in June with bilateral pulmonary emboli and is now  on Eliquis. Long medical problem list but predominantly type 2 diabetes with peripheral neuropathy, spinal stenosis. He is a smoker Most recent arterial studies were done on 03/18/2020 on the right this showed a an ABI in the posterior tibial of 1.05 with biphasic waveforms on the left noncompressible at 1.38 but with a great toe pressure near normal at 1.11 again with triphasic waveforms. 6/29; patient arrives back in clinic. We use Santyl on the posterior calf wound on the right. Arrives in clinic with this looking a lot better. Healthy red granulated tissue over the majority of this. 7/7 the wound is cleaned up quite nicely I think it is time to change from Santyl to [facility]. They are using a local wrap here. There is edema in his upper lower leg. 7/21; using Hydrofera Blue. Nice improvement in surface area they are changing this every 2d 8/18; using Hydrofera Blue and bordered foam. Nice improvement in surface area 9/1; patient has some degree of lymphedema in the right leg we have not been wrapping him using Hydrofera Blue and bordered foam making decent progress in surface area. T oday he arrives with some erythema below the wound scaly skin this there is some form of contact dermatitis perhaps stasis dermatitis. I am not thinking that this is actually bacterial cellulitis as it is neither warm nor tender. The wound itself actually continues to look quite good 9/6; the patient's lymphedema in the right leg is considerably improved with compression. Also result of this the wound area is much better. We have been using Hydrofera Blue now under compression 9/14; still good edema control. Wound is smaller we have been using Hydrofera Blue for a prolonged period of time 9/20; still good edema control. Wound is slightly smaller but with a much healthier surface I changed to Iodoflex last week still under compression 9/30; edema control is good. Wound is smaller very healthy surface. I changed  back to silver collagen today Objective Constitutional Sitting or standing Blood Pressure is within  target range for patient.. Pulse regular and within target range for patient.[name] Kitchen Respirations regular, non-labored and within target range.. Temperature is normal and within the target range for the patient.[name] Kitchen Appears in no distress. Vitals Time Taken: 1:45 PM, Height: 73 in, Source: Stated, Weight: 215 lbs, Source: Stated, BMI: 28.4, Temperature: 98.4 F, Pulse: 94 bpm, Respiratory Rate: 18 breaths/min, Blood Pressure: 128/81 mmHg. General Notes: Wound exam; right posterior lateral calf small wound healthy surface. Some depth perhaps 2 mm no surrounding erythema. Pedal pulses are palpable we have good edema control in the right leg. No debridement is required Integumentary (Hair, Skin) Wound #1 status is Open. Original cause of wound was Surgical Injury. The date acquired was: 02/04/2020. The wound has been in treatment 14 weeks. The wound is located on the Right,Posterior Lower Leg. The wound measures 1cm length x 0.3cm width x 0.1cm depth; 0.236cm^2 area and 0.024cm^3 volume. There is Fat Layer (Subcutaneous Tissue) exposed. There is no tunneling or undermining noted. There is a small amount of serosanguineous drainage noted. The wound margin is distinct with the outline attached to the wound base. There is large (67-100%) red, pink granulation within the wound bed. There is no necrotic tissue within the wound bed. Assessment Active Problems ICD-10 Type 2 diabetes mellitus with diabetic peripheral angiopathy without gangrene Non-pressure chronic ulcer of right calf with other specified severity Procedures Wound #1 Pre-procedure diagnosis of Wound #1 is a Diabetic Wound/Ulcer of the Lower Extremity located on the Right,Posterior Lower Leg . There was a Three Layer Compression Therapy Procedure by Baruch Gouty, RN. Post procedure Diagnosis Wound #1: Same as Pre-Procedure Plan Follow-up  Appointments: Return Appointment in 1 week. - Dr. Dellia Nims Bathing/ Shower/ Hygiene: May shower with protection but do not get wound dressing(s) wet. Edema Control - Lymphedema / SCD / Other: Elevate legs to the level of the heart or above for 30 minutes daily and/or when sitting, a frequency of: - 3-4 times per day Avoid standing for long periods of time. Exercise regularly WOUND #1: - Lower Leg Wound Laterality: Right, Posterior Cleanser: Normal Saline (Generic) 1 x Per Week/30 Days Discharge Instructions: Cleanse the wound with Normal Saline prior to applying a clean dressing using gauze sponges, not tissue or cotton balls. Cleanser: Soap and Water 1 x Per Week/30 Days Discharge Instructions: May shower and wash wound with dial antibacterial soap and water prior to dressing change. Peri-Wound Care: Sween Lotion (Moisturizing lotion) 1 x Per Week/30 Days Discharge Instructions: Apply moisturizing lotion as directed Prim Dressing: Promogran Prisma Matrix, 4.34 (sq in) (silver collagen) 1 x Per Week/30 Days ary Discharge Instructions: Moisten collagen with saline or hydrogel Secondary Dressing: Woven Gauze Sponge, Non-Sterile 4x4 in 1 x Per Week/30 Days Discharge Instructions: Apply over primary dressing as directed. Compression Wrap: ThreePress (3 layer compression wrap) 1 x Per Week/30 Days Discharge Instructions: Apply three layer compression as directed. 1. I change the primary dressing to silver collagen to see if we can get this to epithelialize over. 2. We have excellent edema control with 3 layer compression. No evidence of infection or ischemia Electronic Signature(s) Signed: 10/10/2020 3:46:44 PM By: [name] Entered By: Linton Ham on 10/10/2020 15:12:46 -------------------------------------------------------------------------------- SuperBill Details Patient Name: Date of Service: Wayne Pratt, [name] 10/10/2020 Medical Record Number: [id number] Patient Account  Number: [id number] Date of Birth/Sex: Treating RN: 1960-07-25 (60 y.o. 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

## 2020-10-14 ENCOUNTER — Encounter: Payer: Self-pay | Admitting: Vascular Surgery

## 2020-10-14 ENCOUNTER — Ambulatory Visit: Payer: PPO | Admitting: Vascular Surgery

## 2020-10-14 ENCOUNTER — Other Ambulatory Visit: Payer: Self-pay

## 2020-10-14 ENCOUNTER — Ambulatory Visit (HOSPITAL_COMMUNITY)
Admission: RE | Admit: 2020-10-14 | Discharge: 2020-10-14 | Disposition: A | Discharge status: Home / Self Care | Payer: PPO | Source: Ambulatory Visit | Attending: Vascular Surgery | Admitting: Vascular Surgery

## 2020-10-14 VITALS — BP 116/79 | HR 100 | Temp 98.6°F | Resp 16 | Ht 73.0 in | Wt 238.0 lb

## 2020-10-14 DIAGNOSIS — I70229 Atherosclerosis of native arteries of extremities with rest pain, unspecified extremity: Secondary | ICD-10-CM | POA: Insufficient documentation

## 2020-10-14 DIAGNOSIS — I70221 Atherosclerosis of native arteries of extremities with rest pain, right leg: Secondary | ICD-10-CM

## 2020-10-14 NOTE — Progress Notes (Signed)
Patient name: ROLLO Pratt MRN: [id number] DOB: February 25, 1960 Sex: male  REASON FOR VISIT: 64-monthfollow-up for surveillance of right leg bypass  HPI: Wayne MAJERUSis a 60y.o. male with multiple medical problems including DM, HTN, HLD who presents for 3 month follow-up for surveillance of right leg bypass.  He underwent right common femoral endarterectomy with profundoplasty and bovine patch with a right common femoral to below-knee pop bypass with saphenous vein on 02/04/2020 for CLI with rest pain.  At the time of his bypass had advanced ischemia with ABIs that were 0.  He subsequently underwent partial toe amputations of right toes 1 2 and 5.  The toe amputations healed but unfortunately had a calf wound that has persisted.  He has undergone debridement and then additional debridement with myriad application as a skin substitute.  We have since referred him to Dr. RDellia Nimsin the wound clinic.   He has no new concerns today.  States the wound in his calf has made significant progress that has almost healed.  He is having no pain in the foot.  The right calf is now wrapped in a compression Ace wrap like an Unna boot and this is getting changed every Thursday.  Past Medical History:  Diagnosis Date   Anemia    takes Iron pill daily   Anxiety    takes Xanax daily, 02/28/20- no longer   Arthritis    Asthma    [place]    skin    [place]    takes Metformin daily   Headache    migraine  - not lately   History of bronchitis    mid 90's   History of hypertension    lost weight and been off meds over a yr   History of kidney stones    Passed 31   History of pleural empyema    Hypercholesterolemia    lost weight and been off meds over a yr   Hypertension    Insomnia    takes Trazodone nightly   Neuropathy    Pneumonia    hx of-15+ yrs ago   Sleep apnea    Stop Bang score of 7   Weakness    numbness and tingling    Past Surgical History:  Procedure Laterality Date    ABDOMINAL AORTOGRAM W/LOWER EXTREMITY N/A 01/31/2020   Procedure: ABDOMINAL AORTOGRAM W/LOWER EXTREMITY;  Surgeon: CMarty Heck MD;  [facility];  Service: Cardiovascular;  Laterality: N/A;   AMPUTATION Right 02/29/2020   Procedure: RIGHT TOE AMPUTATIONS, 1, 2, 5;  Surgeon: CMarty Heck MD;  [facility]  Service: Vascular;  Laterality: Right;   APPLICATION OF WOUND VAC Right 02/29/2020   Procedure: APPLICATION OF WOUND VAC;  Surgeon: CMarty Heck MD;  [facility];  Service: Vascular;  Laterality: Right;   APPLICATION OF WOUND VAC Right 04/16/2020   Procedure: APPLICATION OF WOUND VAC;  Surgeon: BSerafina Mitchell MD;  [facility] [facility]  Service: Vascular;  Laterality: Right;   BIOPSY  10/05/2011   Dr. FOneida Alar:non-erosive gastritis   BIOPSY  08/06/2014   Procedure: BIOPSY;  Surgeon: SDanie Binder MD;  Location: [facility];  Service: Endoscopy;;   CERVICAL DISC SURGERY     ELBOW SURGERY Right    ENDARTERECTOMY FEMORAL Right 02/04/2020   Procedure: RIGHT FEMORAL ENDARTERECTOMY with Profundaplasty;  Surgeon: CMarty Heck MD;  [place] [facility]  Service: Vascular;  Laterality: Right;  ESOPHAGOGASTRODUODENOSCOPY  Sept 2013   Dr. Oneida Alar: chronic inactive gastritis    ESOPHAGOGASTRODUODENOSCOPY N/A 08/18/2017   Procedure: ESOPHAGOGASTRODUODENOSCOPY (EGD);  Surgeon: Milus Banister, MD;  [facility];  Service: Endoscopy;  Laterality: N/A;   ESOPHAGOGASTRODUODENOSCOPY N/A 03/09/2018   Procedure: ESOPHAGOGASTRODUODENOSCOPY (EGD);  Surgeon: Milus Banister, MD;  [facility];  Service: Endoscopy;  Laterality: N/A;   ESOPHAGOGASTRODUODENOSCOPY (EGD) WITH PROPOFOL N/A 08/06/2014   mild non-erosive gastritis, duodenitis   EUS N/A 08/18/2017   Procedure: UPPER ENDOSCOPIC ULTRASOUND (EUS) RADIAL;  Surgeon: Milus Banister, MD;  [facility];  Service: Endoscopy;  Laterality: N/A;   EUS N/A 03/09/2018   Procedure: UPPER  ENDOSCOPIC ULTRASOUND (EUS) RADIAL;  Surgeon: Milus Banister, MD;  [facility];  Service: Endoscopy;  Laterality: N/A;   FEMORAL-POPLITEAL BYPASS GRAFT Right 02/04/2020   Procedure: BYPASS GRAFT FEMORAL-POPLITEAL ARTERY With Vein Harvest;  Surgeon: Marty Heck, MD;  Location: [place];  Service: Vascular;  Laterality: Right;   FINE NEEDLE ASPIRATION  03/09/2018   Procedure: FINE NEEDLE ASPIRATION;  Surgeon: Milus Banister, MD;  [facility];  Service: Endoscopy;;   FLEXIBLE SIGMOIDOSCOPY  10/05/2011   Dr. Fields:poor prep   I & D EXTREMITY Right 04/16/2020   Procedure: RIGHT CALF WOUND IRRIGATION, DEBRIDEMENT AND GRAFTING;  Surgeon: Serafina Mitchell, MD;  Location: [place];  Service: Vascular;  Laterality: Right;   LUNG SURGERY  2010   for empyema   PATCH ANGIOPLASTY Right 02/04/2020   Procedure: PATCH ANGIOPLASTY;  Surgeon: Marty Heck, MD;  Location: [place];  Service: Vascular;  Laterality: Right;   SPINAL CORD STIMULATOR INSERTION N/A 08/01/2015   Procedure: Spinal Cord Stimulator Implant;  Surgeon: Eustace Moore, MD;  [facility];  Service: Neurosurgery;  Laterality: N/A;   TONSILLECTOMY     VASECTOMY Bilateral 1995   VEIN HARVEST Right 02/04/2020   Procedure: VEIN HARVEST Greater Saphenous Vein;  Surgeon: Marty Heck, MD;  Location: [place];  Service: Vascular;  Laterality: Right;   WOUND DEBRIDEMENT Right 02/29/2020   Procedure: [place];  Surgeon: Marty Heck, MD;  Location: [place];  Service: Vascular;  Laterality: Right;    Family History  Problem Relation Age of Onset   Prostate cancer Other        all male members on mother's side   Heart attack Father    COPD Maternal Grandmother    Cancer Paternal Grandmother    Colon cancer Neg Hx     SOCIAL HISTORY: Social History   Tobacco Use   Smoking status: Former    Packs/day: 1.00    Years: 39.00    Pack years: 39.00    Types: Cigarettes    Quit date:  02/04/2020    Years since quitting: 0.6   Smokeless tobacco: Never  Substance Use Topics   Alcohol use: No    Alcohol/week: 0.0 standard drinks    Allergies  Allergen Reactions   Penicillins Anaphylaxis and Other (See Comments)    Has patient had a PCN reaction causing immediate rash, facial/tongue/throat swelling, SOB or lightheadedness with hypotension: Yes Has patient had a PCN reaction causing severe rash involving mucus membranes or skin necrosis: Yes Has patient had a PCN reaction that required hospitalization: No Has patient had a PCN reaction occurring within the last 10 years: No If all of the above answers are "NO", then may proceed with Cephalosporin use.    Vancomycin Anaphylaxis   Ativan [  Lorazepam] Other (See Comments)    Violent and Mean     Current Outpatient Medications  Medication Sig Dispense Refill   acetaminophen (TYLENOL) 650 MG CR tablet Take 1,300 mg by mouth every 8 (eight) hours as needed for pain.     albuterol (VENTOLIN HFA) 108 (90 Base) MCG/ACT inhaler Inhale 1-2 puffs into the lungs every 6 (six) hours as needed for wheezing or shortness of breath. 18 g 0   apixaban (ELIQUIS) 5 MG TABS tablet Take 1 tablet (5 mg total) by mouth 2 (two) times daily. 60 tablet 2   clonazePAM (KLONOPIN) 0.5 MG tablet Take 0.5 mg by mouth 2 (two) times daily.      GLIPIZIDE XL 10 MG 24 hr tablet Take 10 mg by mouth 2 (two) times daily.     lisinopril (ZESTRIL) 10 MG tablet Take 1 tablet (10 mg total) by mouth daily. Please call to schedule appointment for further refills. Thanks! 30 tablet 0   lovastatin (MEVACOR) 40 MG tablet Take 40 mg by mouth at bedtime.      Melatonin 10 MG TABS Take 40 mg by mouth at bedtime.     metFORMIN (GLUCOPHAGE) 1000 MG tablet Take 1,000 mg by mouth 2 (two) times daily.      oxyCODONE 10 MG TABS Take 1 tablet (10 mg total) by mouth every 6 (six) hours as needed for breakthrough pain. 28 tablet 0   pioglitazone (ACTOS) 15 MG tablet Take 15 mg by  mouth at bedtime.     APIXABAN (ELIQUIS) VTE STARTER PACK (10MG AND 5MG) Take as directed on package: start with two-5m tablets twice daily for 7 days. On day 8, switch to one-520mtablet twice daily. (Patient not taking: Reported on 10/14/2020) 1 each 0   aspirin EC 81 MG EC tablet Take 1 tablet (81 mg total) by mouth daily at 6 (six) AM. Swallow whole. (Patient not taking: Reported on 07/15/2020) 30 tablet 11   HYDROmorphone (DILAUDID) 4 MG tablet Take 4 mg by mouth 4 (four) times daily as needed.     No current facility-administered medications for this visit.    REVIEW OF SYSTEMS:  [X]  denotes positive finding, [ ]  denotes negative finding Cardiac  Comments:  Chest pain or chest pressure:    Shortness of breath upon exertion:    Short of breath when lying flat:    Irregular heart rhythm:        Vascular    Pain in calf, thigh, or hip brought on by ambulation:    Pain in feet at night that wakes you up from your sleep:     Blood clot in your veins:    Leg swelling:         Pulmonary    Oxygen at home:    Productive cough:     Wheezing:         Neurologic    Sudden weakness in arms or legs:     Sudden numbness in arms or legs:     Sudden onset of difficulty speaking or slurred speech:    Temporary loss of vision in one eye:     Problems with dizziness:         Gastrointestinal    Blood in stool:     Vomited blood:         Genitourinary    Burning when urinating:     Blood in urine:        Psychiatric    Major depression:  Hematologic    Bleeding problems:    Problems with blood clotting too easily:        Skin    Rashes or ulcers:        Constitutional    Fever or chills:      PHYSICAL EXAM: Vitals:   10/14/20 1351  BP: 116/79  Pulse: 100  Resp: 16  Temp: 98.6 F (37 C)  TempSrc: Temporal  SpO2: 95%  Weight: 238 lb (108 kg)  Height: 6' 1"  (1.854 m)    GENERAL: The patient is a well-nourished male, in no acute distress. The vital signs are  documented above. CARDIAC: There is a regular rate and rhythm.  VASCULAR:  Right foot is warm and all previous to amputations of healed Right calf wrapped with multilayer compression wrap from the wound clinic   DATA:   Right leg arterial duplex shows widely patent bypass with no obvious stenosis.  Assessment/Plan:  60 year old male status post right common femoral endarterectomy with profundoplasty and bovine patch and then a right common femoral to BK pop bypass with vein on 02/04/2020 for CLI with rest pain and subsequent toe amputations.  His bypass is widely patent on duplex today.  Appreciate the assistance of Dr. Dellia Nims in the wound clinic.  Discussed he can follow-up with me in 1 year with noninvasive imaging including right leg arterial duplex and ABIs.  We will arrange for the PA clinic as discussed with him today.   Marty Heck, MD [facility] Office: [phone]

## 2020-10-16 ENCOUNTER — Other Ambulatory Visit: Payer: Self-pay

## 2020-10-16 ENCOUNTER — Encounter (HOSPITAL_BASED_OUTPATIENT_CLINIC_OR_DEPARTMENT_OTHER): Payer: PPO | Attending: Internal Medicine | Admitting: Internal Medicine

## 2020-10-16 DIAGNOSIS — E11621 Type 2 diabetes mellitus with foot ulcer: Secondary | ICD-10-CM | POA: Diagnosis not present

## 2020-10-16 DIAGNOSIS — F17200 Nicotine dependence, unspecified, uncomplicated: Secondary | ICD-10-CM | POA: Diagnosis not present

## 2020-10-16 DIAGNOSIS — I890 Lymphedema, not elsewhere classified: Secondary | ICD-10-CM | POA: Insufficient documentation

## 2020-10-16 DIAGNOSIS — L97218 Non-pressure chronic ulcer of right calf with other specified severity: Secondary | ICD-10-CM | POA: Diagnosis not present

## 2020-10-16 DIAGNOSIS — E11622 Type 2 diabetes mellitus with other skin ulcer: Secondary | ICD-10-CM | POA: Diagnosis not present

## 2020-10-16 DIAGNOSIS — E1151 Type 2 diabetes mellitus with diabetic peripheral angiopathy without gangrene: Secondary | ICD-10-CM | POA: Diagnosis not present

## 2020-10-16 DIAGNOSIS — L97212 Non-pressure chronic ulcer of right calf with fat layer exposed: Secondary | ICD-10-CM | POA: Diagnosis not present

## 2020-10-16 DIAGNOSIS — Z86711 Personal history of pulmonary embolism: Secondary | ICD-10-CM | POA: Diagnosis not present

## 2020-10-16 NOTE — Progress Notes (Signed)
SAXTON, CHAIN ([id number]) Visit Report for 10/16/2020 Arrival Information Details Patient Name: Date of Service: Wayne Pratt. 10/16/2020 10:30 A M Medical Record Number: [id number] Patient Account Number: [id number] Date of Birth/Sex: Treating RN: [birth year] (60 y.o. Wayne Pratt Primary Care Wayne Pratt: Wayne Pratt Other Clinician: Referring Kimbley Sprague: Treating Wayne Pratt/Extender: Christel Mormon in Treatment: 14 Visit Information History Since Last Visit Added or deleted any medications: No Patient Arrived: Ambulatory Any new allergies or adverse reactions: No Arrival Time: 10:47 Had a fall or experienced change in No Accompanied By: spouse activities of daily living that may affect Transfer Assistance: None risk of falls: Patient Identification Verified: Yes Signs or symptoms of abuse/neglect since last visito No Secondary Verification Process Completed: Yes Hospitalized since last visit: No Patient Requires Transmission-Based Precautions: No Implantable device outside of the clinic excluding No Patient Has Alerts: Yes cellular tissue based products placed in the center Patient Alerts: Patient on Blood Thinner since last visit: ABI's: R:1.05 03/22 Has Dressing in Place as Prescribed: Yes Has Compression in Place as Prescribed: Yes Pain Present Now: No Electronic Signature(s) Signed: 10/16/2020 6:21:03 PM By: Levan Hurst RN, BSN Entered By: Levan Hurst on 10/16/2020 10:48:15 -------------------------------------------------------------------------------- Compression Therapy Details Patient Name: Date of Service: Wayne Pratt, [name] 10/16/2020 10:30 A M Medical Record Number: [id number] Patient Account Number: [id number] Date of Birth/Sex: Treating RN: [birth year] (60 y.o. Wayne Pratt Primary Care Wayne Pratt: Wayne Pratt Other Clinician: Referring Saylah Ketner: Treating Rigel Filsinger/Extender: Fredderick Severance Weeks in  Treatment: 14 Compression Therapy Performed for Wound Assessment: Wound #1 Right,Posterior Lower Leg Performed By: Clinician Baruch Gouty, RN Compression Type: Three Layer Post Procedure Diagnosis Same as Pre-procedure Electronic Signature(s) Signed: 10/16/2020 6:17:04 PM By: Baruch Gouty RN, BSN Entered By: Baruch Gouty on 10/16/2020 11:41:38 -------------------------------------------------------------------------------- Encounter Discharge Information Details Patient Name: Date of Service: Wayne Pratt, [place] W. 10/16/2020 10:30 A M Medical Record Number: [id number] Patient Account Number: [id number] Date of Birth/Sex: Treating RN: 10-Oct-1960 (60 y.o. Wayne Pratt Primary Care Wayne Pratt: Wayne Pratt Other Clinician: Referring Ji Fairburn: Treating Dai Apel/Extender: Christel Mormon in Treatment: 14 Encounter Discharge Information Items Post Procedure Vitals Discharge Condition: Stable Temperature (F): 98.2 Ambulatory Status: Ambulatory Pulse (bpm): 84 Discharge Destination: Home Respiratory Rate (breaths/min): 18 Transportation: Private Auto Blood Pressure (mmHg): 117/72 Accompanied By: spouse Schedule Follow-up Appointment: Yes Clinical Summary of Care: Patient Declined Electronic Signature(s) Signed: 10/16/2020 6:17:04 PM By: Baruch Gouty RN, BSN Entered By: Baruch Gouty on 10/16/2020 11:53:24 -------------------------------------------------------------------------------- Lower Extremity Assessment Details Patient Name: Date of Service: Wayne Pratt, [name] 10/16/2020 10:30 A M Medical Record Number: [id number] Patient Account Number: [id number] Date of Birth/Sex: Treating RN: [birth year] (60 y.o. Wayne Pratt Primary Care Wayne Pratt: Wayne Pratt Other Clinician: Referring Charlie Char: Treating Andrell Tallman/Extender: Fredderick Severance Weeks in Treatment: 14 Edema Assessment Assessed: [Left: No] [Right: No] Edema:  [Left: Ye] [Right: s] Calf Left: Right: Point of Measurement: 34 cm From Medial Instep 37.3 cm Ankle Left: Right: Point of Measurement: 9 cm From Medial Instep 22 cm Vascular Assessment Pulses: Dorsalis Pedis Palpable: [Right:Yes] Electronic Signature(s) Signed: 10/16/2020 6:21:03 PM By: Levan Hurst RN, BSN Entered By: Levan Hurst on 10/16/2020 10:56:15 -------------------------------------------------------------------------------- Multi Wound Chart Details Patient Name: Date of Service: Wayne Wayne Pratt, [name] 10/16/2020 10:30 A M Medical Record Number: [id number] Patient Account Number: [id number] Date of Birth/Sex: Treating RN: Nov 13, 1960 (60 y.o. Wayne Pratt Primary Care Mubashir Mallek: Wayne Pratt Other Clinician: Referring Dequann Vandervelden: Treating  Brendan Gruwell/Extender: Martie Lee, Alethea Weeks in Treatment: 14 Vital Signs Height(in): 73 Capillary Blood Glucose(mg/dl): 156 Weight(lbs): 215 Pulse(bpm): 72 Body Mass Index(BMI): 28 Blood Pressure(mmHg): 117/72 Temperature(F): 98.2 Respiratory Rate(breaths/min): 18 Photos: [N/A:N/A] Right, Posterior Lower Leg N/A N/A Wound Location: Surgical Injury N/A N/A Wounding Event: Diabetic Wound/Ulcer of the Lower N/A N/A Primary Etiology: Extremity Anemia, Asthma, Sleep Apnea, N/A N/A Comorbid History: Hypertension, Peripheral Arterial Disease, Type II Diabetes, Rheumatoid Arthritis, Neuropathy 02/04/2020 N/A N/A Date Acquired: 14 N/A N/A Weeks of Treatment: Open N/A N/A Wound Status: 0.5x0.2x0.1 N/A N/A Measurements L x W x D (cm) 0.079 N/A N/A A (cm) : rea 0.008 N/A N/A Volume (cm) : 99.90% N/A N/A % Reduction in A rea: 99.90% N/A N/A % Reduction in Volume: Grade 2 N/A N/A Classification: Small N/A N/A Exudate A mount: Serosanguineous N/A N/A Exudate Type: red, brown N/A N/A Exudate Color: Distinct, outline attached N/A N/A Wound Margin: Large (67-100%) N/A N/A Granulation A  mount: Pink, Pale N/A N/A Granulation Quality: None Present (0%) N/A N/A Necrotic A mount: Fat Layer (Subcutaneous Tissue): Yes N/A N/A Exposed Structures: Fascia: No Tendon: No Muscle: No Joint: No Bone: No Large (67-100%) N/A N/A Epithelialization: Debridement - Selective/Open Wound N/A N/A Debridement: Pre-procedure Verification/Time Out 11:35 N/A N/A Taken: Lidocaine 4% Topical Solution N/A N/A Pain Control: Callus N/A N/A Tissue Debrided: Skin/Epidermis N/A N/A Level: 0.1 N/A N/A Debridement A (sq cm): rea Curette N/A N/A Instrument: Minimum N/A N/A Bleeding: Pressure N/A N/A Hemostasis A chieved: 0 N/A N/A Procedural Pain: 0 N/A N/A Post Procedural Pain: Procedure was tolerated well N/A N/A Debridement Treatment Response: 0.5x0.2x0.1 N/A N/A Post Debridement Measurements L x W x D (cm) 0.008 N/A N/A Post Debridement Volume: (cm) Compression Therapy N/A N/A Procedures Performed: Debridement Treatment Notes Wound #1 (Lower Leg) Wound Laterality: Right, Posterior Cleanser Normal Saline Discharge Instruction: Cleanse the wound with Normal Saline prior to applying a clean dressing using gauze sponges, not tissue or cotton balls. Soap and Water Discharge Instruction: May shower and wash wound with dial antibacterial soap and water prior to dressing change. Peri-Wound Care Sween Lotion (Moisturizing lotion) Discharge Instruction: Apply moisturizing lotion as directed Topical Primary Dressing Promogran Prisma Matrix, 4.34 (sq in) (silver collagen) Discharge Instruction: Moisten collagen with hydrogel Secondary Dressing Woven Gauze Sponge, Non-Sterile 4x4 in Discharge Instruction: Apply over primary dressing as directed. Secured With Compression Wrap ThreePress (3 layer compression wrap) Discharge Instruction: Apply three layer compression as directed. Compression Stockings Add-Ons Electronic Signature(s) Signed: 10/16/2020 5:45:05 PM By: [name] Signed: 10/16/2020 6:25:33 PM By: Deon Pilling RN, BSN Entered By: Linton Ham on 10/16/2020 29:51:88 -------------------------------------------------------------------------------- Multi-Disciplinary Care Plan Details Patient Name: Date of Service: Wayne Pratt, [place] W. 10/16/2020 10:30 A M Medical Record Number: [id number] Patient Account Number: [id number] Date of Birth/Sex: Treating RN: Sep 24, 1960 (60 y.o. Wayne Pratt Primary Care Macallister Ashmead: Wayne Pratt Other Clinician: Referring Angelea Penny: Treating Corliss Coggeshall/Extender: Christel Mormon in Treatment: [place] reviewed with physician Active Inactive Nutrition Nursing Diagnoses: Impaired glucose control: actual or potential Potential for alteratiion in Nutrition/Potential for imbalanced nutrition Goals: Patient/caregiver will maintain therapeutic glucose control Date Initiated: 07/04/2020 Target Resolution Date: 11/05/2020 Goal Status: Active Interventions: Assess HgA1c results as ordered upon admission and as needed Provide education on elevated blood sugars and impact on wound healing Provide education on nutrition Treatment Activities: Education provided on Nutrition : 08/14/2020 Patient referred to Primary Care Physician for further nutritional evaluation : 07/04/2020 Notes: Wound/Skin  Impairment Nursing Diagnoses: Impaired tissue integrity Knowledge deficit related to ulceration/compromised skin integrity Goals: Patient/caregiver will verbalize understanding of skin care regimen Date Initiated: 07/04/2020 Target Resolution Date: 11/05/2020 Goal Status: Active Ulcer/skin breakdown will have a volume reduction of 30% by week 4 Date Initiated: 07/04/2020 Date Inactivated: 08/28/2020 Target Resolution Date: 08/08/2020 Goal Status: Met Interventions: Assess patient/caregiver ability to obtain necessary supplies Assess patient/caregiver ability to perform  ulcer/skin care regimen upon admission and as needed Assess ulceration(s) every visit Provide education on ulcer and skin care Treatment Activities: Skin care regimen initiated : 07/04/2020 Topical wound management initiated : 07/04/2020 Notes: Electronic Signature(s) Signed: 10/16/2020 6:21:03 PM By: Levan Hurst RN, BSN Entered By: Levan Hurst on 10/16/2020 10:58:52 -------------------------------------------------------------------------------- Pain Assessment Details Patient Name: Date of Service: Wayne Pratt, [place] [employer] W. 10/16/2020 10:30 A M Medical Record Number: [id number] Patient Account Number: [id number] Date of Birth/Sex: Treating RN: [birth year] (60 y.o. Wayne Pratt Primary Care Emberly Tomasso: Wayne Pratt Other Clinician: Referring Manaal Mandala: Treating Gurveer Colucci/Extender: Fredderick Severance Weeks in Treatment: 14 Active Problems Location of Pain Severity and Description of Pain Patient Has Paino No Site Locations Pain Management and Medication Current Pain Management: Electronic Signature(s) Signed: 10/16/2020 6:21:03 PM By: Levan Hurst RN, BSN Entered By: Levan Hurst on 10/16/2020 10:49:28 -------------------------------------------------------------------------------- Patient/Caregiver Education Details Patient Name: Date of Service: Wayne Pratt, [place] 10/6/2022andnbsp10:30 A M Medical Record Number: [id number] Patient Account Number: [id number] Date of Birth/Gender: Treating RN: [birth year] (60 y.o. Wayne Pratt Primary Care Physician: Wayne Pratt Other Clinician: Referring Physician: Treating Physician/Extender: Christel Mormon in Treatment: 14 Education Assessment Education Provided To: Patient Education Topics Provided Wound/Skin Impairment: Methods: Explain/Verbal Responses: State content correctly [employer]) Signed: 10/16/2020 6:21:03 PM By: Levan Hurst RN, BSN Entered By: Levan Hurst  on 10/16/2020 10:59:24 -------------------------------------------------------------------------------- Wound Assessment Details Patient Name: Date of Service: Wayne Pratt, [place] RY W. 10/16/2020 10:30 A M Medical Record Number: [id number] Patient Account Number: [id number] Date of Birth/Sex: Treating RN: Jun 22, 1960 (60 y.o. Wayne Pratt Primary Care Argel Pablo: Wayne Pratt Other Clinician: Referring Callee Rohrig: Treating Jacqulyne Gladue/Extender: Fredderick Severance Weeks in Treatment: 14 Wound Status Wound Number: 1 Primary Diabetic Wound/Ulcer of the Lower Extremity Etiology: Wound Location: Right, Posterior Lower Leg Wound Open Wounding Event: Surgical Injury Status: Date Acquired: 02/04/2020 Comorbid Anemia, Asthma, Sleep Apnea, Hypertension, Peripheral Arterial Weeks Of Treatment: 14 History: Disease, Type II Diabetes, Rheumatoid Arthritis, Neuropathy Clustered Wound: No Photos Wound Measurements Length: (cm) 0.5 Width: (cm) 0.2 Depth: (cm) 0.1 Area: (cm) 0.079 Volume: (cm) 0.008 % Reduction in Area: 99.9% % Reduction in Volume: 99.9% Epithelialization: Large (67-100%) Tunneling: No Undermining: No Wound Description Classification: Grade 2 Wound Margin: Distinct, outline attached Exudate Amount: Small Exudate Type: Serosanguineous Exudate Color: red, brown Foul Odor After Cleansing: No Slough/Fibrino No Wound Bed Granulation Amount: Large (67-100%) Exposed Structure Granulation Quality: Pink, Pale Fascia Exposed: No Necrotic Amount: None Present (0%) Fat Layer (Subcutaneous Tissue) Exposed: Yes Tendon Exposed: No Muscle Exposed: No Joint Exposed: No Bone Exposed: No Treatment Notes Wound #1 (Lower Leg) Wound Laterality: Right, Posterior Cleanser Normal Saline Discharge Instruction: Cleanse the wound with Normal Saline prior to applying a clean dressing using gauze sponges, not tissue or cotton balls. Soap and Water Discharge Instruction: May shower  and wash wound with dial antibacterial soap and water prior to dressing change. Peri-Wound Care Sween Lotion (Moisturizing lotion) Discharge Instruction: Apply moisturizing lotion as directed Topical Primary Dressing Promogran Prisma Matrix, 4.34 (sq in) (silver collagen) Discharge  Instruction: Moisten collagen with hydrogel Secondary Dressing Woven Gauze Sponge, Non-Sterile 4x4 in Discharge Instruction: Apply over primary dressing as directed. Secured With Compression Wrap ThreePress (3 layer compression wrap) Discharge Instruction: Apply three layer compression as directed. Compression Stockings Add-Ons Electronic Signature(s) Signed: 10/16/2020 6:17:04 PM By: Baruch Gouty RN, BSN Signed: 10/16/2020 6:21:03 PM By: Levan Hurst RN, BSN Entered By: Baruch Gouty on 10/16/2020 10:58:34 -------------------------------------------------------------------------------- [place] Details Patient Name: Date of Service: Wayne Wayne Pratt, [place] W. 10/16/2020 10:30 A M Medical Record Number: [id number] Patient Account Number: [id number] Date of Birth/Sex: Treating RN: 05-Nov-1960 (60 y.o. Wayne Pratt Primary Care Joanathan Affeldt: Wayne Pratt Other Clinician: Referring Jaylynn Mcaleer: Treating Janet Decesare/Extender: Fredderick Severance Weeks in Treatment: 14 Vital Signs Time Taken: 10:47 Temperature (F): 98.2 Height (in): 73 Pulse (bpm): 84 Weight (lbs): 215 Respiratory Rate (breaths/min): 18 Body Mass Index (BMI): 28.4 Blood Pressure (mmHg): 117/72 Capillary Blood Glucose (mg/dl): 156 Reference Range: 80 - 120 mg / dl Notes glucose per pt report this am Electronic Signature(s) Signed: 10/16/2020 6:21:03 PM By: Levan Hurst RN, BSN Entered By: Levan Hurst on 10/16/2020 10:48:54

## 2020-10-16 NOTE — Progress Notes (Signed)
EDWARD, GUTHMILLER ([id number]) Visit Report for 10/16/2020 Debridement Details Patient Name: Date of Service: Wayne Pratt. 10/16/2020 10:30 A M Medical Record Number: [id number] Patient Account Number: [id number] Date of Birth/Sex: Treating RN: Aug 12, 1960 (60 y.o. [name] Primary Care Provider: [name] Other Clinician: Referring Provider: Treating Provider/Extender: Christel Mormon in Treatment: 14 Debridement Performed for Assessment: Wound #1 Right,Posterior Lower Leg Performed By: Physician Ricard Dillon., MD Debridement Type: Debridement Severity of Tissue Pre Debridement: Fat layer exposed Level of Consciousness (Pre-procedure): Awake and Alert Pre-procedure Verification/Time Out Yes - 11:35 Taken: Start Time: 11:35 Pain Control: Lidocaine 4% T opical Solution T Area Debrided (L x W): otal 0.5 (cm) x 0.2 (cm) = 0.1 (cm) Tissue and other material debrided: Non-Viable, Callus, Skin: Epidermis Level: Skin/Epidermis Debridement Description: Selective/Open Wound Instrument: Curette Bleeding: Minimum Hemostasis Achieved: Pressure Procedural Pain: 0 Post Procedural Pain: 0 Response to Treatment: Procedure was tolerated well Level of Consciousness (Post- Awake and Alert procedure): Post Debridement Measurements of Total Wound Length: (cm) 0.5 Width: (cm) 0.2 Depth: (cm) 0.1 Volume: (cm) 0.008 Character of Wound/Ulcer Post Debridement: Improved Severity of Tissue Post Debridement: Fat layer exposed Post Procedure Diagnosis Same as Pre-procedure Electronic Signature(s) Signed: 10/16/2020 5:45:05 PM By: [name] Signed: 10/16/2020 6:25:33 PM By: Deon Pilling RN, BSN Entered By: Linton Ham on 10/16/2020 12:28:34 -------------------------------------------------------------------------------- HPI Details Patient Name: Date of Service: Wayne Pratt, [place] RY W. 10/16/2020 10:30 A M Medical Record Number: [id number] Patient Account  Number: [id number] Date of Birth/Sex: Treating RN: 05-Apr-1960 (60 y.o. [name] Primary Care Provider: [name] Other Clinician: Referring Provider: Treating Provider/Extender: Fredderick Severance Weeks in Treatment: 14 History of Present Illness HPI Description: ADMISSION 07/04/2020 This is a 60 year old woman who presented to Dr. Carlis Abbott in January of this year having critical limb ischemia. First angiogram was on 01/31/2020 showed proximal profound disease in the SFA with right foot runoff via diseased posterior tibia. He underwent a right femoral endarterectomy with a right common femoral to below-knee popliteal bypass on 02/04/2020. There were generally good vascular results however the day after the surgery according to his wife he developed a black eschar at presumably ischemic spot on the right posterior calf. He also ultimately had amputations on 02/29/2020 of the right first second and fifth toes in March she had a wound VAC on the right calf but this was too painful and was switched to wet-to-dry. At 1 point this clearly had exposed muscle and the wife was able to document this by showing me short movie on his phone. He had an operative debridement on the right calf in early April with a skin substitute oMyriad. In any case none of this really made any difference. He was hospitalized early in June with bilateral pulmonary emboli and is now on Eliquis. Long medical problem list but predominantly type 2 diabetes with peripheral neuropathy, spinal stenosis. He is a smoker Most recent arterial studies were done on 03/18/2020 on the right this showed a an ABI in the posterior tibial of 1.05 with biphasic waveforms on the left noncompressible at 1.38 but with a great toe pressure near normal at 1.11 again with triphasic waveforms. 6/29; patient arrives back in clinic. We use Santyl on the posterior calf wound on the right. Arrives in clinic with this looking a lot better.  Healthy red granulated tissue over the majority of this. 7/7 the wound is cleaned up quite nicely I think it is time  to change from Santyl to [facility]. They are using a local wrap here. There is edema in his upper lower leg. 7/21; using Hydrofera Blue. Nice improvement in surface area they are changing this every 2d 8/18; using Hydrofera Blue and bordered foam. Nice improvement in surface area 9/1; patient has some degree of lymphedema in the right leg we have not been wrapping him using Hydrofera Blue and bordered foam making decent progress in surface area. T oday he arrives with some erythema below the wound scaly skin this there is some form of contact dermatitis perhaps stasis dermatitis. I am not thinking that this is actually bacterial cellulitis as it is neither warm nor tender. The wound itself actually continues to look quite good 9/6; the patient's lymphedema in the right leg is considerably improved with compression. Also result of this the wound area is much better. We have been using Hydrofera Blue now under compression 9/14; still good edema control. Wound is smaller we have been using Hydrofera Blue for a prolonged period of time 9/20; still good edema control. Wound is slightly smaller but with a much healthier surface I changed to Iodoflex last week still under compression 9/30; edema control is good. Wound is smaller very healthy surface. I changed back to silver collagen today 10/6; edema control is adequate with 4-layer I am using silver collagen the wound measures smaller. Still some eschar and flaking skin around the margins Electronic Signature(s) Signed: 10/16/2020 5:45:05 PM By: [name] Entered By: Linton Ham on 10/16/2020 12:29:11 -------------------------------------------------------------------------------- Physical Exam Details Patient Name: Date of Service: Wayne Pratt, [place] RY W. 10/16/2020 10:30 A M Medical Record Number: [id number] Patient Account  Number: [id number] Date of Birth/Sex: Treating RN: [birth year] (60 y.o. [name] Primary Care Provider: [name] Other Clinician: Referring Provider: Treating Provider/Extender: Fredderick Severance Weeks in Treatment: 14 Constitutional Sitting or standing Blood Pressure is within target range for patient.. Pulse regular and within target range for patient.[name] Kitchen Respirations regular, non-labored and within target range.. Temperature is normal and within the target range for the patient.[name] Kitchen Appears in no distress. Notes Wound exam; right posterior lateral calf. Smaller wound. Surface does not look too bad there is some thick skin and callus around the edge that I debrided with a #3 curette. Hemostasis with direct pressure. The wound appears to be contracting I am hopeful debridement will allow for final epithelialization Electronic Signature(s) Signed: 10/16/2020 5:45:05 PM By: [name] Entered By: Linton Ham on 10/16/2020 12:29:52 -------------------------------------------------------------------------------- Physician Orders Details Patient Name: Date of Service: Wayne Pratt, Wayne W. 10/16/2020 10:30 A M Medical Record Number: [id number] Patient Account Number: [id number] Date of Birth/Sex: Treating RN: [birth year] (60 y.o. 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 - Dr. Dellia Nims Return A Bathing/ Shower/ Hygiene May shower with protection but do not get wound dressing(s) wet. Edema Control - Lymphedema / SCD / Other Elevate legs to the  level of the heart or above for 30 minutes daily and/or when sitting, a frequency of: - 3-4 times per day Avoid standing for long periods of time. Exercise regularly Wound Treatment Wound #1 - Lower Leg Wound Laterality: Right, Posterior Cleanser: Normal  Saline (Generic) 1 x Per Week/30 Days Discharge Instructions: Cleanse the wound with Normal Saline prior to applying a clean dressing using gauze sponges, not tissue or cotton balls. Cleanser: Soap and Water 1 x Per Week/30 Days Discharge Instructions: May shower and wash wound with dial antibacterial soap and water prior to dressing change. Peri-Wound Care: Sween Lotion (Moisturizing lotion) 1 x Per Week/30 Days Discharge Instructions: Apply moisturizing lotion as directed Prim Dressing: Promogran Prisma Matrix, 4.34 (sq in) (silver collagen) ary 1 x Per Week/30 Days Discharge Instructions: Moisten collagen with hydrogel Secondary Dressing: Woven Gauze Sponge, Non-Sterile 4x4 in 1 x Per Week/30 Days Discharge Instructions: Apply over primary dressing as directed. Compression Wrap: ThreePress (3 layer compression wrap) 1 x Per Week/30 Days Discharge Instructions: Apply three layer compression as directed. Electronic Signature(s) Signed: 10/16/2020 5:45:05 PM By: [name] Signed: 10/16/2020 6:17:04 PM By: Baruch Gouty RN, BSN Entered By: Baruch Gouty on 10/16/2020 11:40:43 -------------------------------------------------------------------------------- Problem List Details Patient Name: Date of Service: Wayne Pratt, [place] RY W. 10/16/2020 10:30 A M Medical Record Number: [id number] Patient Account Number: [id number] Date of Birth/Sex: Treating RN: [birth year] (60 y.o. 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 10/16/2020 10:30 A M Medical Record Number: [id number] Patient Account Number: [id number] Date of Birth/Sex: Treating RN: Apr 08, 1960 (60 y.o. [name] Primary Care Provider: [name] Other Clinician: Referring Provider: Treating Provider/Extender: Fredderick Severance Weeks in Treatment: 14 Subjective History of Present Illness (HPI) ADMISSION 07/04/2020 This is a 60 year old woman who presented to Dr. Carlis Abbott in January of this year having critical limb ischemia. First angiogram was on 01/31/2020 showed proximal profound disease in the SFA with right foot runoff via diseased posterior tibia. He underwent a right femoral endarterectomy with a right common femoral to below-knee popliteal bypass on 02/04/2020. There were generally good vascular results however the day after the surgery according to his wife he developed a black eschar at presumably ischemic spot on the right posterior calf. He also ultimately had amputations on 02/29/2020 of the right first second and fifth toes in March she had a wound VAC on the right calf but this was too painful and was switched to wet-to-dry. At 1 point this clearly had exposed muscle and the wife was able to document this by showing me short movie on his phone. He had an operative debridement on the right calf in early April with a skin substitute oMyriad. In any case none of this really made any difference. He was hospitalized early in  June with bilateral pulmonary emboli and is now on Eliquis. Long medical problem list but predominantly type 2 diabetes with peripheral neuropathy, spinal stenosis. He is a smoker Most recent arterial studies were done on 03/18/2020 on the right this showed a an ABI in the posterior tibial of 1.05 with biphasic waveforms on the left noncompressible at 1.38 but with a great toe pressure near normal at 1.11 again with triphasic waveforms. 6/29; patient arrives back in clinic.  We use Santyl on the posterior calf wound on the right. Arrives in clinic with this looking a lot better. Healthy red granulated tissue over the majority of this. 7/7 the wound is cleaned up quite nicely I think it is time to change from Santyl to [facility]. They are using a local wrap here. There is edema in his upper lower leg. 7/21; using Hydrofera Blue. Nice improvement in surface area they are changing this every 2d 8/18; using Hydrofera Blue and bordered foam. Nice improvement in surface area 9/1; patient has some degree of lymphedema in the right leg we have not been wrapping him using Hydrofera Blue and bordered foam making decent progress in surface area. T oday he arrives with some erythema below the wound scaly skin this there is some form of contact dermatitis perhaps stasis dermatitis. I am not thinking that this is actually bacterial cellulitis as it is neither warm nor tender. The wound itself actually continues to look quite good 9/6; the patient's lymphedema in the right leg is considerably improved with compression. Also result of this the wound area is much better. We have been using Hydrofera Blue now under compression 9/14; still good edema control. Wound is smaller we have been using Hydrofera Blue for a prolonged period of time 9/20; still good edema control. Wound is slightly smaller but with a much healthier surface I changed to Iodoflex last week still under compression 9/30; edema control is good.  Wound is smaller very healthy surface. I changed back to silver collagen today 10/6; edema control is adequate with 4-layer I am using silver collagen the wound measures smaller. Still some eschar and flaking skin around the margins Objective Constitutional Sitting or standing Blood Pressure is within target range for patient.. Pulse regular and within target range for patient.[name] Kitchen Respirations regular, non-labored and within target range.. Temperature is normal and within the target range for the patient.[name] Kitchen Appears in no distress. Vitals Time Taken: 10:47 AM, Height: 73 in, Weight: 215 lbs, BMI: 28.4, Temperature: 98.2 F, Pulse: 84 bpm, Respiratory Rate: 18 breaths/min, Blood Pressure: 117/72 mmHg, Capillary Blood Glucose: 156 mg/dl. General Notes: glucose per pt report this am General Notes: Wound exam; right posterior lateral calf. Smaller wound. Surface does not look too bad there is some thick skin and callus around the edge that I debrided with a #3 curette. Hemostasis with direct pressure. The wound appears to be contracting I am hopeful debridement will allow for final epithelialization Integumentary (Hair, Skin) Wound #1 status is Open. Original cause of wound was Surgical Injury. The date acquired was: 02/04/2020. The wound has been in treatment 14 weeks. The wound is located on the Right,Posterior Lower Leg. The wound measures 0.5cm length x 0.2cm width x 0.1cm depth; 0.079cm^2 area and 0.008cm^3 volume. There is Fat Layer (Subcutaneous Tissue) exposed. There is no tunneling or undermining noted. There is a small amount of serosanguineous drainage noted. The wound margin is distinct with the outline attached to the wound base. There is large (67-100%) pink, pale granulation within the wound bed. There is no necrotic tissue within the wound bed. Assessment Active Problems ICD-10 Type 2 diabetes mellitus with diabetic peripheral angiopathy without gangrene Non-pressure chronic ulcer of  right calf with other specified severity Procedures Wound #1 Pre-procedure diagnosis of Wound #1 is a Diabetic Wound/Ulcer of the Lower Extremity located on the Right,Posterior Lower Leg .Severity of Tissue Pre Debridement is: Fat layer exposed. There was a Selective/Open Wound Skin/Epidermis Debridement with a total area  of 0.1 sq cm performed by Ricard Dillon., MD. With the following instrument(s): Curette to remove Non-Viable tissue/material. Material removed includes Callus and Skin: Epidermis and after achieving pain control using Lidocaine 4% Topical Solution. No specimens were taken. A time out was conducted at 11:35, prior to the start of the procedure. A Minimum amount of bleeding was controlled with Pressure. The procedure was tolerated well with a pain level of 0 throughout and a pain level of 0 following the procedure. Post Debridement Measurements: 0.5cm length x 0.2cm width x 0.1cm depth; 0.008cm^3 volume. Character of Wound/Ulcer Post Debridement is improved. Severity of Tissue Post Debridement is: Fat layer exposed. Post procedure Diagnosis Wound #1: Same as Pre-Procedure Pre-procedure diagnosis of Wound #1 is a Diabetic Wound/Ulcer of the Lower Extremity located on the Right,Posterior Lower Leg . There was a Three Layer Compression Therapy Procedure by Baruch Gouty, RN. Post procedure Diagnosis Wound #1: Same as Pre-Procedure Plan Follow-up Appointments: Return Appointment in 1 week. - Dr. Dellia Nims Bathing/ Shower/ Hygiene: May shower with protection but do not get wound dressing(s) wet. Edema Control - Lymphedema / SCD / Other: Elevate legs to the level of the heart or above for 30 minutes daily and/or when sitting, a frequency of: - 3-4 times per day Avoid standing for long periods of time. Exercise regularly WOUND #1: - Lower Leg Wound Laterality: Right, Posterior Cleanser: Normal Saline (Generic) 1 x Per Week/30 Days Discharge Instructions: Cleanse the wound with  Normal Saline prior to applying a clean dressing using gauze sponges, not tissue or cotton balls. Cleanser: Soap and Water 1 x Per Week/30 Days Discharge Instructions: May shower and wash wound with dial antibacterial soap and water prior to dressing change. Peri-Wound Care: Sween Lotion (Moisturizing lotion) 1 x Per Week/30 Days Discharge Instructions: Apply moisturizing lotion as directed Prim Dressing: Promogran Prisma Matrix, 4.34 (sq in) (silver collagen) 1 x Per Week/30 Days ary Discharge Instructions: Moisten collagen with hydrogel Secondary Dressing: Woven Gauze Sponge, Non-Sterile 4x4 in 1 x Per Week/30 Days Discharge Instructions: Apply over primary dressing as directed. Com pression Wrap: ThreePress (3 layer compression wrap) 1 x Per Week/30 Days Discharge Instructions: Apply three layer compression as directed. 1. I am still using silver collagen under 4-layer compression 2. Debridement today as described. Still looking for a wound edge in surface to allow for final epithelialization Electronic Signature(s) Signed: 10/16/2020 5:45:05 PM By: [name] Entered By: Linton Ham on 10/16/2020 12:30:35 -------------------------------------------------------------------------------- SuperBill Details Patient Name: Date of Service: Wayne Pratt, Wayne RY W. 10/16/2020 Medical Record Number: [id number] Patient Account Number: [id number] Date of Birth/Sex: Treating RN: December 03, 1960 (60 y.o. 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

## 2020-10-23 ENCOUNTER — Encounter (HOSPITAL_BASED_OUTPATIENT_CLINIC_OR_DEPARTMENT_OTHER): Payer: PPO | Admitting: Internal Medicine

## 2020-10-23 ENCOUNTER — Other Ambulatory Visit: Payer: Self-pay

## 2020-10-23 DIAGNOSIS — E1151 Type 2 diabetes mellitus with diabetic peripheral angiopathy without gangrene: Secondary | ICD-10-CM | POA: Diagnosis not present

## 2020-10-23 DIAGNOSIS — T8189XA Other complications of procedures, not elsewhere classified, initial encounter: Secondary | ICD-10-CM | POA: Diagnosis not present

## 2020-10-23 NOTE — Progress Notes (Signed)
[name], [name] ([id number]) Visit Report for 10/23/2020 HPI Details Patient Name: Date of Service: Wayne Pratt. 10/23/2020 2:00 PM Medical Record Number: [id number] Patient Account Number: [id number] Date of Birth/Sex: Treating RN: Jan 16, 1960 (60 y.o. Wayne Pratt Primary Care Provider: [name] Other Clinician: Referring Provider: Treating Provider/Extender: Fredderick Severance Weeks in Treatment: 15 History of Present Illness HPI Description: ADMISSION 07/04/2020 This is a 60 year old woman who presented to Dr. Carlis Abbott in January of this year having critical limb ischemia. First angiogram was on 01/31/2020 showed proximal profound disease in the SFA with right foot runoff via diseased posterior tibia. He underwent a right femoral endarterectomy with a right common femoral to below-knee popliteal bypass on 02/04/2020. There were generally good vascular results however the day after the surgery according to his wife he developed a black eschar at presumably ischemic spot on the right posterior calf. He also ultimately had amputations on 02/29/2020 of the right first second and fifth toes in March she had a wound VAC on the right calf but this was too painful and was switched to wet-to-dry. At 1 point this clearly had exposed muscle and the wife was able to document this by showing me short movie on his phone. He had an operative debridement on the right calf in early April with a skin substitute oMyriad. In any case none of this really made any difference. He was hospitalized early in June with bilateral pulmonary emboli and is now on Eliquis. Long medical problem list but predominantly type 2 diabetes with peripheral neuropathy, spinal stenosis. He is a smoker Most recent arterial studies were done on 03/18/2020 on the right this showed a an ABI in the posterior tibial of 1.05 with biphasic waveforms on the left noncompressible at 1.38 but with a great toe pressure near  normal at 1.11 again with triphasic waveforms. 6/29; patient arrives back in clinic. We use Santyl on the posterior calf wound on the right. Arrives in clinic with this looking a lot better. Healthy red granulated tissue over the majority of this. 7/7 the wound is cleaned up quite nicely I think it is time to change from Santyl to [facility]. They are using a local wrap here. There is edema in his upper lower leg. 7/21; using Hydrofera Blue. Nice improvement in surface area they are changing this every 2d 8/18; using Hydrofera Blue and bordered foam. Nice improvement in surface area 9/1; patient has some degree of lymphedema in the right leg we have not been wrapping him using Hydrofera Blue and bordered foam making decent progress in surface area. T oday he arrives with some erythema below the wound scaly skin this there is some form of contact dermatitis perhaps stasis dermatitis. I am not thinking that this is actually bacterial cellulitis as it is neither warm nor tender. The wound itself actually continues to look quite good 9/6; the patient's lymphedema in the right leg is considerably improved with compression. Also result of this the wound area is much better. We have been using Hydrofera Blue now under compression 9/14; still good edema control. Wound is smaller we have been using Hydrofera Blue for a prolonged period of time 9/20; still good edema control. Wound is slightly smaller but with a much healthier surface I changed to Iodoflex last week still under compression 9/30; edema control is good. Wound is smaller very healthy surface. I changed back to silver collagen today 10/6; edema control is adequate with 4-layer I am using  silver collagen the wound measures smaller. Still some eschar and flaking skin around the margins 10/13; edema control is adequate. He had eschar over the wound area. Although they thought this might be closed when we remove the eschar there is still an open  area. Therefore we will redress this and rewrap Electronic Signature(s) Signed: 10/23/2020 4:49:53 PM By: Wayne Ham MD Entered By: Wayne Pratt on 10/23/2020 16:37:58 -------------------------------------------------------------------------------- Physical Exam Details Patient Name: Date of Service: Wayne Pratt, [name] 10/23/2020 2:00 PM Medical Record Number: [id number] Patient Account Number: [id number] Date of Birth/Sex: Treating RN: 1960/11/14 (60 y.o. Wayne Pratt Primary Care Provider: [name] Other Clinician: Referring Provider: Treating Provider/Extender: Fredderick Severance Weeks in Treatment: 15 Notes Wound exam; right posterior lateral calf. T otally eschared over the surface I used a #5 curette the painstakingly and gently remove this in spite of this not but not surprisingly there is a small linear open area underneath this. This is accounting for some of the drainage that coalesces over this area. There is no evidence of infection Electronic Signature(s) Signed: 10/23/2020 4:49:53 PM By: Wayne Ham MD Entered By: Wayne Pratt on 10/23/2020 16:38:41 -------------------------------------------------------------------------------- Physician Orders Details Patient Name: Date of Service: Wayne Pratt, [name] 10/23/2020 2:00 PM Medical Record Number: [id number] Patient Account Number: [id number] Date of Birth/Sex: Treating RN: 1960-09-20 (60 y.o. [name] Primary Care Provider: [name] Other Clinician: Referring Provider: Treating Provider/Extender: Fredderick Severance Weeks in Treatment: 15 Verbal / Phone Orders: No Diagnosis Coding Follow-up Appointments ppointment in 1 week. - Dr. Dellia Nims Return A Bathing/ Shower/ Hygiene May shower with protection but do not get wound dressing(s) wet. Edema Control - Lymphedema / SCD / Other Elevate legs to the level of the heart or above for 30 minutes daily and/or  when sitting, a frequency of: - 3-4 times per day Avoid standing for long periods of time. Exercise regularly Wound Treatment Wound #1 - Lower Leg Wound Laterality: Right, Posterior Cleanser: Normal Saline (Generic) 1 x Per Week/30 Days Discharge Instructions: Cleanse the wound with Normal Saline prior to applying a clean dressing using gauze sponges, not tissue or cotton balls. Cleanser: Soap and Water 1 x Per Week/30 Days Discharge Instructions: May shower and wash wound with dial antibacterial soap and water prior to dressing change. Peri-Wound Care: Sween Lotion (Moisturizing lotion) 1 x Per Week/30 Days Discharge Instructions: Apply moisturizing lotion as directed Prim Dressing: KerraCel Ag Gelling Fiber Dressing, 4x5 in (silver alginate) 1 x Per Week/30 Days ary Discharge Instructions: Apply silver alginate to wound bed as instructed Secondary Dressing: Woven Gauze Sponge, Non-Sterile 4x4 in 1 x Per Week/30 Days Discharge Instructions: Apply over primary dressing as directed. Compression Wrap: ThreePress (3 layer compression wrap) 1 x Per Week/30 Days Discharge Instructions: Apply three layer compression as directed. Electronic Signature(s) Signed: 10/23/2020 4:45:09 PM By: [name] RN Signed: 10/23/2020 4:49:53 PM By: Wayne Ham MD Entered By: [name] on 10/23/2020 14:19:05 -------------------------------------------------------------------------------- Problem List Details Patient Name: Date of Service: Wayne Pratt, [place] RY W. 10/23/2020 2:00 PM Medical Record Number: [id number] Patient Account Number: [id number] Date of Birth/Sex: Treating RN: 09-29-1960 (60 y.o. Wayne Pratt Primary Care Provider: [name] Other Clinician: Referring Provider: Treating Provider/Extender: Fredderick Severance Weeks in Treatment: 15 Active Problems ICD-10 Encounter Code Description Active Date MDM Diagnosis E11.51 Type 2 diabetes mellitus with  diabetic peripheral angiopathy without gangrene 07/04/2020 No Yes L97.218 Non-pressure chronic ulcer of right calf with other  specified severity 07/04/2020 No Yes Inactive Problems Resolved Problems Electronic Signature(s) Signed: 10/23/2020 4:49:53 PM By: Wayne Ham MD Entered By: Wayne Pratt on 10/23/2020 16:37:09 -------------------------------------------------------------------------------- Progress Note Details Patient Name: Date of Service: Wayne Pratt, [name] 10/23/2020 2:00 PM Medical Record Number: [id number] Patient Account Number: [id number] Date of Birth/Sex: Treating RN: 12/08/1960 (60 y.o. Wayne Pratt Primary Care Provider: [name] Other Clinician: Referring Provider: Treating Provider/Extender: Fredderick Severance Weeks in Treatment: 15 Subjective History of Present Illness (HPI) ADMISSION 07/04/2020 This is a 60 year old woman who presented to Dr. Carlis Abbott in January of this year having critical limb ischemia. First angiogram was on 01/31/2020 showed proximal profound disease in the SFA with right foot runoff via diseased posterior tibia. He underwent a right femoral endarterectomy with a right common femoral to below-knee popliteal bypass on 02/04/2020. There were generally good vascular results however the day after the surgery according to his wife he developed a black eschar at presumably ischemic spot on the right posterior calf. He also ultimately had amputations on 02/29/2020 of the right first second and fifth toes in March she had a wound VAC on the right calf but this was too painful and was switched to wet-to-dry. At 1 point this clearly had exposed muscle and the wife was able to document this by showing me short movie on his phone. He had an operative debridement on the right calf in early April with a skin substitute oMyriad. In any case none of this really made any difference. He was hospitalized early in June with bilateral pulmonary  emboli and is now on Eliquis. Long medical problem list but predominantly type 2 diabetes with peripheral neuropathy, spinal stenosis. He is a smoker Most recent arterial studies were done on 03/18/2020 on the right this showed a an ABI in the posterior tibial of 1.05 with biphasic waveforms on the left noncompressible at 1.38 but with a great toe pressure near normal at 1.11 again with triphasic waveforms. 6/29; patient arrives back in clinic. We use Santyl on the posterior calf wound on the right. Arrives in clinic with this looking a lot better. Healthy red granulated tissue over the majority of this. 7/7 the wound is cleaned up quite nicely I think it is time to change from Santyl to [facility]. They are using a local wrap here. There is edema in his upper lower leg. 7/21; using Hydrofera Blue. Nice improvement in surface area they are changing this every 2d 8/18; using Hydrofera Blue and bordered foam. Nice improvement in surface area 9/1; patient has some degree of lymphedema in the right leg we have not been wrapping him using Hydrofera Blue and bordered foam making decent progress in surface area. T oday he arrives with some erythema below the wound scaly skin this there is some form of contact dermatitis perhaps stasis dermatitis. I am not thinking that this is actually bacterial cellulitis as it is neither warm nor tender. The wound itself actually continues to look quite good 9/6; the patient's lymphedema in the right leg is considerably improved with compression. Also result of this the wound area is much better. We have been using Hydrofera Blue now under compression 9/14; still good edema control. Wound is smaller we have been using Hydrofera Blue for a prolonged period of time 9/20; still good edema control. Wound is slightly smaller but with a much healthier surface I changed to Iodoflex last week still under compression 9/30; edema control is good. Wound is smaller  very healthy  surface. I changed back to silver collagen today 10/6; edema control is adequate with 4-layer I am using silver collagen the wound measures smaller. Still some eschar and flaking skin around the margins 10/13; edema control is adequate. He had eschar over the wound area. Although they thought this might be closed when we remove the eschar there is still an open area. Therefore we will redress this and rewrap Objective Constitutional Vitals Time Taken: 1:47 PM, Height: 73 in, Weight: 215 lbs, BMI: 28.4, Temperature: 98.4 F, Pulse: 94 bpm, Respiratory Rate: 18 breaths/min, Blood Pressure: 137/80 mmHg. Integumentary (Hair, Skin) Wound #1 status is Open. Original cause of wound was Surgical Injury. The date acquired was: 02/04/2020. The wound has been in treatment 15 weeks. The wound is located on the Right,Posterior Lower Leg. The wound measures 0.1cm length x 0.1cm width x 0.1cm depth; 0.008cm^2 area and 0.001cm^3 volume. There is Fat Layer (Subcutaneous Tissue) exposed. There is a small amount of serosanguineous drainage noted. The wound margin is distinct with the outline attached to the wound base. There is large (67-100%) pink, pale granulation within the wound bed. There is no necrotic tissue within the wound bed. Assessment Active Problems ICD-10 Type 2 diabetes mellitus with diabetic peripheral angiopathy without gangrene Non-pressure chronic ulcer of right calf with other specified severity Procedures Wound #1 Pre-procedure diagnosis of Wound #1 is a Diabetic Wound/Ulcer of the Lower Extremity located on the Right,Posterior Lower Leg . There was a Three Layer Compression Therapy Procedure by [name], RN. Post procedure Diagnosis Wound #1: Same as Pre-Procedure Plan Follow-up Appointments: Return Appointment in 1 week. - Dr. Dellia Nims Bathing/ Shower/ Hygiene: May shower with protection but do not get wound dressing(s) wet. Edema Control - Lymphedema / SCD / Other: Elevate  legs to the level of the heart or above for 30 minutes daily and/or when sitting, a frequency of: - 3-4 times per day Avoid standing for long periods of time. Exercise regularly WOUND #1: - Lower Leg Wound Laterality: Right, Posterior Cleanser: Normal Saline (Generic) 1 x Per Week/30 Days Discharge Instructions: Cleanse the wound with Normal Saline prior to applying a clean dressing using gauze sponges, not tissue or cotton balls. Cleanser: Soap and Water 1 x Per Week/30 Days Discharge Instructions: May shower and wash wound with dial antibacterial soap and water prior to dressing change. Peri-Wound Care: Sween Lotion (Moisturizing lotion) 1 x Per Week/30 Days Discharge Instructions: Apply moisturizing lotion as directed Prim Dressing: KerraCel Ag Gelling Fiber Dressing, 4x5 in (silver alginate) 1 x Per Week/30 Days ary Discharge Instructions: Apply silver alginate to wound bed as instructed Secondary Dressing: Woven Gauze Sponge, Non-Sterile 4x4 in 1 x Per Week/30 Days Discharge Instructions: Apply over primary dressing as directed. Com pression Wrap: ThreePress (3 layer compression wrap) 1 x Per Week/30 Days Discharge Instructions: Apply three layer compression as directed. 1. Continuing with silver alginate under compression. Hopefully closed by next week. We are using 3 layer compression Electronic Signature(s) Signed: 10/23/2020 4:49:53 PM By: Wayne Ham MD Entered By: Wayne Pratt on 10/23/2020 16:39:16 -------------------------------------------------------------------------------- SuperBill Details Patient Name: Date of Service: Wayne Pratt, [name] 10/23/2020 Medical Record Number: [id number] Patient Account Number: [id number] Date of Birth/Sex: Treating RN: 10-28-1960 (60 y.o. [name] Primary Care Provider: [name] Other Clinician: Referring Provider: Treating Provider/Extender: Fredderick Severance Weeks in Treatment: 15 Diagnosis  Coding ICD-10 Codes Code Description E11.51 Type 2 diabetes mellitus with diabetic peripheral angiopathy without gangrene L97.218 Non-pressure chronic  ulcer of right calf with other specified severity Facility Procedures CPT4 Code: [id number] Description: (Facility Use Only) [phone] - [facility] RT LEG Modifier: Quantity: 1 Physician Procedures : CPT4 Code Description Modifier [id number] - WC PHYS LEVEL 3 - EST PT ICD-10 Diagnosis Description E11.51 Type 2 diabetes mellitus with diabetic peripheral angiopathy without gangrene L97.218 Non-pressure chronic ulcer of right calf with other  specified severity Quantity: 1 Electronic Signature(s) Signed: 10/23/2020 4:49:53 PM By: Wayne Ham MD Entered By: Wayne Pratt on 10/23/2020 16:39:36

## 2020-10-30 ENCOUNTER — Other Ambulatory Visit: Payer: Self-pay

## 2020-10-30 ENCOUNTER — Encounter (HOSPITAL_BASED_OUTPATIENT_CLINIC_OR_DEPARTMENT_OTHER): Payer: PPO | Admitting: Internal Medicine

## 2020-10-30 DIAGNOSIS — E1151 Type 2 diabetes mellitus with diabetic peripheral angiopathy without gangrene: Secondary | ICD-10-CM | POA: Diagnosis not present

## 2020-10-30 DIAGNOSIS — E11622 Type 2 diabetes mellitus with other skin ulcer: Secondary | ICD-10-CM | POA: Diagnosis not present

## 2020-10-30 DIAGNOSIS — L97212 Non-pressure chronic ulcer of right calf with fat layer exposed: Secondary | ICD-10-CM | POA: Diagnosis not present

## 2020-10-30 NOTE — Progress Notes (Signed)
DAMICHAEL, HOFMAN ([id number]) Visit Report for 10/30/2020 Debridement Details Patient Name: Date of Service: Wayne Pratt 10/30/2020 2:30 PM Medical Record Number: [id number] Patient Account Number: [id number] Date of Birth/Sex: Treating RN: 07/01/1960 (60 y.o. [name] Primary Care Provider: [name] Other Clinician: Referring Provider: Treating Provider/Extender: Christel Mormon in Treatment: 16 Debridement Performed for Assessment: Wound #1 Right,Posterior Lower Leg Performed By: Physician Ricard Dillon., MD Debridement Type: Debridement Severity of Tissue Pre Debridement: Fat layer exposed Level of Consciousness (Pre-procedure): Awake and Alert Pre-procedure Verification/Time Out Yes - 15:30 Taken: Start Time: 15:31 Pain Control: Lidocaine 5% topical ointment T Area Debrided (L x W): otal 2 (cm) x 2 (cm) = 4 (cm) Tissue and other material debrided: Non-Viable, Eschar, Skin: Epidermis Level: Skin/Epidermis Debridement Description: Selective/Open Wound Instrument: Curette Bleeding: None End Time: 15:39 Response to Treatment: Procedure was tolerated well Level of Consciousness (Post- Awake and Alert procedure): Post Debridement Measurements of Total Wound Length: (cm) 0.1 Width: (cm) 0.1 Depth: (cm) 0.1 Volume: (cm) 0.001 Character of Wound/Ulcer Post Debridement: Improved Severity of Tissue Post Debridement: Fat layer exposed Post Procedure Diagnosis Same as Pre-procedure Electronic Signature(s) Signed: 10/30/2020 5:12:42 PM By: [name] Signed: 10/30/2020 5:40:08 PM By: Deon Pilling RN, BSN Entered By: Linton Ham on 10/30/2020 16:20:48 -------------------------------------------------------------------------------- HPI Details Patient Name: Date of Service: Wayne Pratt, Wayne RY W. 10/30/2020 2:30 PM Medical Record Number: [id number] Patient Account Number: [id number] Date of Birth/Sex: Treating RN: [birth year] (60  y.o. [name] Primary Care Provider: [name] Other Clinician: Referring Provider: Treating Provider/Extender: Fredderick Severance Weeks in Treatment: 16 History of Present Illness HPI Description: ADMISSION 07/04/2020 This is a 60 year old woman who presented to Dr. Carlis Abbott in January of this year having critical limb ischemia. First angiogram was on 01/31/2020 showed proximal profound disease in the SFA with right foot runoff via diseased posterior tibia. He underwent a right femoral endarterectomy with a right common femoral to below-knee popliteal bypass on 02/04/2020. There were generally good vascular results however the day after the surgery according to his wife he developed a black eschar at presumably ischemic spot on the right posterior calf. He also ultimately had amputations on 02/29/2020 of the right first second and fifth toes in March she had a wound VAC on the right calf but this was too painful and was switched to wet-to-dry. At 1 point this clearly had exposed muscle and the wife was able to document this by showing me short movie on his phone. He had an operative debridement on the right calf in early April with a skin substitute oMyriad. In any case none of this really made any difference. He was hospitalized early in June with bilateral pulmonary emboli and is now on Eliquis. Long medical problem list but predominantly type 2 diabetes with peripheral neuropathy, spinal stenosis. He is a smoker Most recent arterial studies were done on 03/18/2020 on the right this showed a an ABI in the posterior tibial of 1.05 with biphasic waveforms on the left noncompressible at 1.38 but with a great toe pressure near normal at 1.11 again with triphasic waveforms. 6/29; patient arrives back in clinic. We use Santyl on the posterior calf wound on the right. Arrives in clinic with this looking a lot better. Healthy red granulated tissue over the majority of this. 7/7 the  wound is cleaned up quite nicely I think it is time to change from Santyl to [facility]. They are using  a local wrap here. There is edema in his upper lower leg. 7/21; using Hydrofera Blue. Nice improvement in surface area they are changing this every 2d 8/18; using Hydrofera Blue and bordered foam. Nice improvement in surface area 9/1; patient has some degree of lymphedema in the right leg we have not been wrapping him using Hydrofera Blue and bordered foam making decent progress in surface area. T oday he arrives with some erythema below the wound scaly skin this there is some form of contact dermatitis perhaps stasis dermatitis. I am not thinking that this is actually bacterial cellulitis as it is neither warm nor tender. The wound itself actually continues to look quite good 9/6; the patient's lymphedema in the right leg is considerably improved with compression. Also result of this the wound area is much better. We have been using Hydrofera Blue now under compression 9/14; still good edema control. Wound is smaller we have been using Hydrofera Blue for a prolonged period of time 9/20; still good edema control. Wound is slightly smaller but with a much healthier surface I changed to Iodoflex last week still under compression 9/30; edema control is good. Wound is smaller very healthy surface. I changed back to silver collagen today 10/6; edema control is adequate with 4-layer I am using silver collagen the wound measures smaller. Still some eschar and flaking skin around the margins 10/13; edema control is adequate. He had eschar over the wound area. Although they thought this might be closed when we remove the eschar there is still an open area. Therefore we will redress this and rewrap 10/20; edema control is good. He still does not have his stockings again the area is heavily covered in eschar. I gently remove this with a #5 curette I did not see anything precisely open here. However small  draining area could easily do this. We are going to change him to polymen still under the same compression while he orders stockings Electronic Signature(s) Signed: 10/30/2020 5:12:42 PM By: [name] Entered By: Linton Ham on 10/30/2020 16:21:44 -------------------------------------------------------------------------------- Physical Exam Details Patient Name: Date of Service: Wayne Pratt, Wayne RY W. 10/30/2020 2:30 PM Medical Record Number: [id number] Patient Account Number: [id number] Date of Birth/Sex: Treating RN: 08-Jul-1960 (60 y.o. [name] Primary Care Provider: [name] Other Clinician: Referring Provider: Treating Provider/Extender: Fredderick Severance Weeks in Treatment: 16 Constitutional Sitting or standing Blood Pressure is within target range for patient.. Pulse regular and within target range for patient.[name] Kitchen Respirations regular, non-labored and within target range.. Temperature is normal and within the target range for the patient.[name] Kitchen Appears in no distress. Notes Exam; right posterior lateral calf thick dried skin and callus I gently removed all of this. Very small open area perhaps accounting for some of the drainage. Perhaps 0.1 x 0.1 by point 1 there is no evidence of surrounding infection Electronic Signature(s) Signed: 10/30/2020 5:12:42 PM By: [name] Entered By: Linton Ham on 10/30/2020 16:22:29 -------------------------------------------------------------------------------- Physician Orders Details Patient Name: Date of Service: Wayne Pratt, Wayne RY W. 10/30/2020 2:30 PM Medical Record Number: [id number] Patient Account Number: [id number] Date of Birth/Sex: Treating RN: December 08, 1960 (60 y.o. [name] Primary Care Provider: [name] Other Clinician: Referring Provider: Treating Provider/Extender: Christel Mormon in Treatment: 16 Verbal / Phone Orders: No Diagnosis Coding ICD-10  Coding Code Description E11.51 Type 2 diabetes mellitus with diabetic peripheral angiopathy without gangrene L97.218 Non-pressure chronic ulcer of right calf with other specified severity Follow-up Appointments ppointment  in 1 week. - Dr. Dellia Nims Return A Purchase and bring in stockings next week. Bathing/ Shower/ Hygiene May shower with protection but do not get wound dressing(s) wet. Edema Control - Lymphedema / SCD / Other Elevate legs to the level of the heart or above for 30 minutes daily and/or when sitting, a frequency of: - 3-4 times per day Avoid standing for long periods of time. Exercise regularly Wound Treatment Wound #1 - Lower Leg Wound Laterality: Right, Posterior Cleanser: Soap and Water 1 x Per Week Discharge Instructions: May shower and wash wound with dial antibacterial soap and water prior to dressing change. Cleanser: Wound Cleanser 1 x Per Week Discharge Instructions: Cleanse the wound with wound cleanser prior to applying a clean dressing using gauze sponges, not tissue or cotton balls. Peri-Wound Care: Sween Lotion (Moisturizing lotion) 1 x Per Week Discharge Instructions: Apply moisturizing lotion as directed Prim Dressing: PolyMem Silver Non-Adhesive Dressing, 4.25x4.25 in 1 x Per Week ary Discharge Instructions: Apply to wound bed as instructed Secondary Dressing: ABD Pad, 5x9 1 x Per Week Discharge Instructions: Apply over primary dressing as directed. Compression Wrap: ThreePress (3 layer compression wrap) 1 x Per Week Discharge Instructions: Apply three layer compression as directed. Electronic Signature(s) Signed: 10/30/2020 5:12:42 PM By: [name] Signed: 10/30/2020 5:40:08 PM By: Deon Pilling RN, BSN Entered By: Deon Pilling on 10/30/2020 15:42:13 -------------------------------------------------------------------------------- Problem List Details Patient Name: Date of Service: Wayne Pratt, Wayne RY W. 10/30/2020 2:30 PM Medical Record Number:  [id number] Patient Account Number: [id number] Date of Birth/Sex: Treating RN: 03/08/1960 (60 y.o. 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 10/30/2020 2:30 PM Medical Record Number: [id number] Patient Account Number: [id number] Date of Birth/Sex: Treating RN: 09/09/1960 (60 y.o. [name] Primary Care Provider: [name] Other Clinician: Referring Provider: Treating Provider/Extender: Fredderick Severance Weeks in Treatment: 16 Subjective History of Present Illness (HPI) ADMISSION 07/04/2020 This is a 61 year old woman who presented to Dr. Carlis Abbott in January of this year having critical limb ischemia. First angiogram was on 01/31/2020 showed proximal profound disease in the SFA with right foot runoff via diseased posterior tibia. He underwent a right femoral endarterectomy with a right common femoral to below-knee popliteal bypass on 02/04/2020. There were generally good vascular results however the day after the surgery according to his wife he developed a black eschar at presumably ischemic spot on the right posterior calf. He also ultimately had amputations on 02/29/2020 of the right first second and fifth toes in March she had a wound VAC on the  right calf but this was too painful and was switched to wet-to-dry. At 1 point this clearly had exposed muscle and the wife was able to document this by showing me short movie on his phone. He had an operative debridement on the right calf in early April with a skin substitute oMyriad. In any case none of this really made any difference. He was hospitalized early in June with bilateral pulmonary emboli and is  now on Eliquis. Long medical problem list but predominantly type 2 diabetes with peripheral neuropathy, spinal stenosis. He is a smoker Most recent arterial studies were done on 03/18/2020 on the right this showed a an ABI in the posterior tibial of 1.05 with biphasic waveforms on the left noncompressible at 1.38 but with a great toe pressure near normal at 1.11 again with triphasic waveforms. 6/29; patient arrives back in clinic. We use Santyl on the posterior calf wound on the right. Arrives in clinic with this looking a lot better. Healthy red granulated tissue over the majority of this. 7/7 the wound is cleaned up quite nicely I think it is time to change from Santyl to [facility]. They are using a local wrap here. There is edema in his upper lower leg. 7/21; using Hydrofera Blue. Nice improvement in surface area they are changing this every 2d 8/18; using Hydrofera Blue and bordered foam. Nice improvement in surface area 9/1; patient has some degree of lymphedema in the right leg we have not been wrapping him using Hydrofera Blue and bordered foam making decent progress in surface area. T oday he arrives with some erythema below the wound scaly skin this there is some form of contact dermatitis perhaps stasis dermatitis. I am not thinking that this is actually bacterial cellulitis as it is neither warm nor tender. The wound itself actually continues to look quite good 9/6; the patient's lymphedema in the right leg is considerably improved with compression. Also result of this the wound  area is much better. We have been using Hydrofera Blue now under compression 9/14; still good edema control. Wound is smaller we have been using Hydrofera Blue for a prolonged period of time 9/20; still good edema control. Wound is slightly smaller but with a much healthier surface I changed to Iodoflex last week still under compression 9/30; edema control is good. Wound is smaller very healthy surface. I changed back to silver collagen today 10/6; edema control is adequate with 4-layer I am using silver collagen the wound measures smaller. Still some eschar and flaking skin around the margins 10/13; edema control is adequate. He had eschar over the wound area. Although they thought this might be closed when we remove the eschar there is still an open area. Therefore we will redress this and rewrap 10/20; edema control is good. He still does not have his stockings again the area is heavily covered in eschar. I gently remove this with a #5 curette I did not see anything precisely open here. However small draining area could easily do this. We are going to change him to polymen still under the same compression while he orders stockings Objective Constitutional Sitting or standing Blood Pressure is within target range for patient.. Pulse regular and within target range for patient.[name] Kitchen Respirations regular, non-labored and within target range.. Temperature is normal and within the target range for the patient.[name] Kitchen Appears in no distress. Vitals Time Taken: 3:00 PM, Height: 73 in, Weight: 215 lbs, BMI: 28.4, Temperature: 99 F, Pulse: 111 bpm, Respiratory Rate: 18 breaths/min, Blood Pressure: 113/74 mmHg. General Notes: Exam; right posterior lateral calf thick dried skin and callus I gently removed all of this. Very small open area perhaps accounting for some of the drainage. Perhaps 0.1 x 0.1 by point 1 there is no evidence of surrounding infection Integumentary (Hair, Skin) Wound #1 status is Open.  Original cause of wound was Surgical Injury. The date acquired was: 02/04/2020. The wound has been in treatment 16  weeks. The wound is located on the Right,Posterior Lower Leg. The wound measures 0.1cm length x 0.1cm width x 0.1cm depth; 0.008cm^2 area and 0.001cm^3 volume. There is Fat Layer (Subcutaneous Tissue) exposed. There is no tunneling or undermining noted. There is a none present amount of drainage noted. The wound margin is distinct with the outline attached to the wound base. There is no granulation within the wound bed. There is no necrotic tissue within the wound bed. Assessment Active Problems ICD-10 Type 2 diabetes mellitus with diabetic peripheral angiopathy without gangrene Non-pressure chronic ulcer of right calf with other specified severity Procedures Wound #1 Pre-procedure diagnosis of Wound #1 is a Diabetic Wound/Ulcer of the Lower Extremity located on the Right,Posterior Lower Leg .Severity of Tissue Pre Debridement is: Fat layer exposed. There was a Selective/Open Wound Skin/Epidermis Debridement with a total area of 4 sq cm performed by Ricard Dillon., MD. With the following instrument(s): Curette to remove Non-Viable tissue/material. Material removed includes Eschar and Skin: Epidermis and after achieving pain control using Lidocaine 5% topical ointment. A time out was conducted at 15:30, prior to the start of the procedure. There was no bleeding. The procedure was tolerated well. Post Debridement Measurements: 0.1cm length x 0.1cm width x 0.1cm depth; 0.001cm^3 volume. Character of Wound/Ulcer Post Debridement is improved. Severity of Tissue Post Debridement is: Fat layer exposed. Post procedure Diagnosis Wound #1: Same as Pre-Procedure Pre-procedure diagnosis of Wound #1 is a Diabetic Wound/Ulcer of the Lower Extremity located on the Right,Posterior Lower Leg . There was a Three Layer Compression Therapy Procedure by Deon Pilling, RN. Post procedure Diagnosis  Wound #1: Same as Pre-Procedure Plan Follow-up Appointments: Return Appointment in 1 week. - Dr. Dellia Nims Purchase and bring in stockings next week. Bathing/ Shower/ Hygiene: May shower with protection but do not get wound dressing(s) wet. Edema Control - Lymphedema / SCD / Other: Elevate legs to the level of the heart or above for 30 minutes daily and/or when sitting, a frequency of: - 3-4 times per day Avoid standing for long periods of time. Exercise regularly WOUND #1: - Lower Leg Wound Laterality: Right, Posterior Cleanser: Soap and Water 1 x Per Week/ Discharge Instructions: May shower and wash wound with dial antibacterial soap and water prior to dressing change. Cleanser: Wound Cleanser 1 x Per Week/ Discharge Instructions: Cleanse the wound with wound cleanser prior to applying a clean dressing using gauze sponges, not tissue or cotton balls. Peri-Wound Care: Sween Lotion (Moisturizing lotion) 1 x Per Week/ Discharge Instructions: Apply moisturizing lotion as directed Prim Dressing: PolyMem Silver Non-Adhesive Dressing, 4.25x4.25 in 1 x Per Week/ ary Discharge Instructions: Apply to wound bed as instructed Secondary Dressing: ABD Pad, 5x9 1 x Per Week/ Discharge Instructions: Apply over primary dressing as directed. Com pression Wrap: ThreePress (3 layer compression wrap) 1 x Per Week/ Discharge Instructions: Apply three layer compression as directed. 1. We used polymen under compression 2. I am hopeful to be able to discharge him next week and to 20/30 below-knee stockings from elastic therapy. Electronic Signature(s) Signed: 10/30/2020 5:12:42 PM By: [name] Entered By: Linton Ham on 10/30/2020 16:23:14 -------------------------------------------------------------------------------- SuperBill Details Patient Name: Date of Service: Wayne Pratt, Wayne RY W. 10/30/2020 Medical Record Number: [id number] Patient Account Number: [id number] Date of Birth/Sex: Treating  RN: Apr 04, 1960 (60 y.o. [name] Primary Care Provider: [name] Other Clinician: Referring Provider: Treating Provider/Extender: Fredderick Severance Weeks in Treatment: 16 Diagnosis Coding ICD-10 Codes Code Description E11.51 Type 2 diabetes  mellitus with diabetic peripheral angiopathy without gangrene L97.218 Non-pressure chronic ulcer of right calf with other specified severity Facility Procedures CPT4 Code: [id number] Description: [phone] - DEBRIDE WOUND 1ST 20 SQ CM OR < ICD-10 Diagnosis Description L97.218 Non-pressure chronic ulcer of right calf with other specified severity Modifier: Quantity: 1 Physician Procedures : CPT4 Code Description Modifier [id number] - WC PHYS DEBR WO ANESTH 20 SQ CM ICD-10 Diagnosis Description Z83.672 Non-pressure chronic ulcer of right calf with other specified severity Quantity: 1 Electronic Signature(s) Signed: 10/30/2020 5:12:42 PM By: [name] Entered By: Linton Ham on 10/30/2020 16:23:37

## 2020-10-30 NOTE — Progress Notes (Signed)
TEAGEN, MCLEARY ([id number]) Visit Report for 10/30/2020 Arrival Information Details Patient Name: Date of Service: Wayne Pratt 10/30/2020 2:30 PM Medical Record Number: [id number] Patient Account Number: [id number] Date of Birth/Sex: Treating RN: 27-Jan-1960 (60 y.o. Wayne Pratt, Meta.Reding Primary Care Monchel Pollitt: [name] Other Clinician: Referring Misbah Hornaday: Treating Cherith Tewell/Extender: Christel Mormon in Treatment: 16 Visit Information History Since Last Visit Added or deleted any medications: No Patient Arrived: Ambulatory Any new allergies or adverse reactions: No Arrival Time: 15:00 Had a fall or experienced change in No Accompanied By: wife activities of daily living that may affect Transfer Assistance: None risk of falls: Patient Identification Verified: Yes Signs or symptoms of abuse/neglect since last visito No Secondary Verification Process Completed: Yes Hospitalized since last visit: No Patient Requires Transmission-Based Precautions: No Implantable device outside of the clinic excluding No Patient Has Alerts: Yes cellular tissue based products placed in the center Patient Alerts: Patient on Blood Thinner since last visit: ABI's: R:1.05 03/22 Has Dressing in Place as Prescribed: Yes Has Compression in Place as Prescribed: Yes Pain Present Now: No Electronic Signature(s) Signed: 10/30/2020 5:40:08 PM By: Deon Pilling RN, BSN Entered By: Deon Pilling on 10/30/2020 15:00:28 -------------------------------------------------------------------------------- Compression Therapy Details Patient Name: Date of Service: Wayne Pratt, [name] 10/30/2020 2:30 PM Medical Record Number: [id number] Patient Account Number: [id number] Date of Birth/Sex: Treating RN: Jul 18, 1960 (60 y.o. Wayne Pratt Primary Care Cesilia Shinn: [name] Other Clinician: Referring Kees Idrovo: Treating Rainee Sweatt/Extender: Fredderick Severance Weeks in Treatment:  16 Compression Therapy Performed for Wound Assessment: Wound #1 Right,Posterior Lower Leg Performed By: Clinician Deon Pilling, RN Compression Type: Three Layer Post Procedure Diagnosis Same as Pre-procedure Electronic Signature(s) Signed: 10/30/2020 5:40:08 PM By: Deon Pilling RN, BSN Entered By: Deon Pilling on 10/30/2020 15:40:21 -------------------------------------------------------------------------------- Encounter Discharge Information Details Patient Name: Date of Service: Wayne Pratt, [name] 10/30/2020 2:30 PM Medical Record Number: [id number] Patient Account Number: [id number] Date of Birth/Sex: Treating RN: 13-Apr-1960 (60 y.o. Wayne Pratt Primary Care Shandora Koogler: [name] Other Clinician: Referring Tyronza Happe: Treating Venisa Frampton/Extender: Christel Mormon in Treatment: 16 Encounter Discharge Information Items Post Procedure Vitals Discharge Condition: Stable Temperature (F): 99 Ambulatory Status: Ambulatory Pulse (bpm): 111 Discharge Destination: Home Respiratory Rate (breaths/min): 18 Transportation: Private Auto Blood Pressure (mmHg): 113/74 Accompanied By: self Schedule Follow-up Appointment: Yes Clinical Summary of Care: Electronic Signature(s) Signed: 10/30/2020 5:40:08 PM By: Deon Pilling RN, BSN Entered By: Deon Pilling on 10/30/2020 15:43:33 -------------------------------------------------------------------------------- Lower Extremity Assessment Details Patient Name: Date of Service: Wayne Pratt, [name] 10/30/2020 2:30 PM Medical Record Number: [id number] Patient Account Number: [id number] Date of Birth/Sex: Treating RN: 02-14-1960 (60 y.o. Wayne Pratt Primary Care Geneive Sandstrom: [name] Other Clinician: Referring Rukaya Kleinschmidt: Treating Lucina Betty/Extender: Fredderick Severance Weeks in Treatment: 16 Edema Assessment Assessed: [Left: No] [Right: Yes] Edema: [Left: N] [Right: o] Calf Left: Right: Point  of Measurement: 34 cm From Medial Instep 37 cm Ankle Left: Right: Point of Measurement: 9 cm From Medial Instep 22 cm Vascular Assessment Pulses: Dorsalis Pedis Palpable: [Right:Yes] Electronic Signature(s) Signed: 10/30/2020 5:40:08 PM By: Deon Pilling RN, BSN Entered By: Deon Pilling on 10/30/2020 15:03:26 -------------------------------------------------------------------------------- Multi Wound Chart Details Patient Name: Date of Service: Wayne Pratt, [name] 10/30/2020 2:30 PM Medical Record Number: [id number] Patient Account Number: [id number] Date of Birth/Sex: Treating RN: 1960/05/26 (60 y.o. Wayne Pratt Primary Care Gracianna Vink: [name] Other Clinician: Referring Johnda Billiot: Treating Edwar Coe/Extender: Martie Lee, Sabino Donovan Weeks in  Treatment: 16 Vital Signs Height(in): 73 Pulse(bpm): 111 Weight(lbs): 215 Blood Pressure(mmHg): 113/74 Body Mass Index(BMI): 28 Temperature(F): 99 Respiratory Rate(breaths/min): 18 Photos: [N/A:N/A] Right, Posterior Lower Leg N/A N/A Wound Location: Surgical Injury N/A N/A Wounding Event: Diabetic Wound/Ulcer of the Lower N/A N/A Primary Etiology: Extremity Anemia, Asthma, Sleep Apnea, N/A N/A Comorbid History: Hypertension, Peripheral Arterial Disease, Type II Diabetes, Rheumatoid Arthritis, Neuropathy 02/04/2020 N/A N/A Date Acquired: 16 N/A N/A Weeks of Treatment: Open N/A N/A Wound Status: 0.1x0.1x0.1 N/A N/A Measurements L x W x D (cm) 0.008 N/A N/A A (cm) : rea 0.001 N/A N/A Volume (cm) : 100.00% N/A N/A % Reduction in A rea: 100.00% N/A N/A % Reduction in Volume: Grade 2 N/A N/A Classification: None Present N/A N/A Exudate A mount: Distinct, outline attached N/A N/A Wound Margin: None Present (0%) N/A N/A Granulation A mount: None Present (0%) N/A N/A Necrotic A mount: Fat Layer (Subcutaneous Tissue): Yes N/A N/A Exposed Structures: Fascia: No Tendon: No Muscle: No Joint:  No Bone: No Large (67-100%) N/A N/A Epithelialization: Debridement - Selective/Open Wound N/A N/A Debridement: Pre-procedure Verification/Time Out 15:30 N/A N/A Taken: Lidocaine 5% topical ointment N/A N/A Pain Control: Necrotic/Eschar N/A N/A Tissue Debrided: Skin/Epidermis N/A N/A Level: 4 N/A N/A Debridement A (sq cm): rea Curette N/A N/A Instrument: None N/A N/A Bleeding: Procedure was tolerated well N/A N/A Debridement Treatment Response: 0.1x0.1x0.1 N/A N/A Post Debridement Measurements L x W x D (cm) 0.001 N/A N/A Post Debridement Volume: (cm) Compression Therapy N/A N/A Procedures Performed: Debridement Treatment Notes Wound #1 (Lower Leg) Wound Laterality: Right, Posterior Cleanser Soap and Water Discharge Instruction: May shower and wash wound with dial antibacterial soap and water prior to dressing change. Wound Cleanser Discharge Instruction: Cleanse the wound with wound cleanser prior to applying a clean dressing using gauze sponges, not tissue or cotton balls. Peri-Wound Care Sween Lotion (Moisturizing lotion) Discharge Instruction: Apply moisturizing lotion as directed Topical Primary Dressing PolyMem Silver Non-Adhesive Dressing, 4.25x4.25 in Discharge Instruction: Apply to wound bed as instructed Secondary Dressing ABD Pad, 5x9 Discharge Instruction: Apply over primary dressing as directed. Secured With Compression Wrap ThreePress (3 layer compression wrap) Discharge Instruction: Apply three layer compression as directed. Compression Stockings Add-Ons Electronic Signature(s) Signed: 10/30/2020 5:12:42 PM By: [name] Signed: 10/30/2020 5:40:08 PM By: Deon Pilling RN, BSN Entered By: Linton Ham on 10/30/2020 16:20:36 -------------------------------------------------------------------------------- Multi-Disciplinary Care Plan Details Patient Name: Date of Service: Wayne Pratt, [name] 10/30/2020 2:30 PM Medical Record Number:  [id number] Patient Account Number: [id number] Date of Birth/Sex: Treating RN: Dec 05, 1960 (60 y.o. Wayne Pratt Primary Care Young Brim: [name] Other Clinician: Referring Rie Mcneil: Treating Carlie Solorzano/Extender: Christel Mormon in Treatment: [place] reviewed with physician Active Inactive Nutrition Nursing Diagnoses: Impaired glucose control: actual or potential Potential for alteratiion in Nutrition/Potential for imbalanced nutrition Goals: Patient/caregiver will maintain therapeutic glucose control Date Initiated: 07/04/2020 Target Resolution Date: 11/05/2020 Goal Status: Active Interventions: Assess HgA1c results as ordered upon admission and as needed Provide education on elevated blood sugars and impact on wound healing Provide education on nutrition Treatment Activities: Education provided on Nutrition : 10/23/2020 Patient referred to Primary Care Physician for further nutritional evaluation : 07/04/2020 Notes: Wound/Skin Impairment Nursing Diagnoses: Impaired tissue integrity Knowledge deficit related to ulceration/compromised skin integrity Goals: Patient/caregiver will verbalize understanding of skin care regimen Date Initiated: 07/04/2020 Target Resolution Date: 11/05/2020 Goal Status: Active Ulcer/skin breakdown will have a volume reduction of 30% by week 4 Date Initiated: 07/04/2020  Date Inactivated: 08/28/2020 Target Resolution Date: 08/08/2020 Goal Status: Met Interventions: Assess patient/caregiver ability to obtain necessary supplies Assess patient/caregiver ability to perform ulcer/skin care regimen upon admission and as needed Assess ulceration(s) every visit Provide education on ulcer and skin care Treatment Activities: Skin care regimen initiated : 07/04/2020 Topical wound management initiated : 07/04/2020 Notes: Electronic Signature(s) Signed: 10/30/2020 5:40:08 PM By: Deon Pilling RN, BSN Entered  By: Deon Pilling on 10/30/2020 15:39:17 -------------------------------------------------------------------------------- Pain Assessment Details Patient Name: Date of Service: Wayne Pratt, [name] 10/30/2020 2:30 PM Medical Record Number: [id number] Patient Account Number: [id number] Date of Birth/Sex: Treating RN: [birth year] (60 y.o. Wayne Pratt Primary Care Denis Koppel: [name] Other Clinician: Referring Manuela Halbur: Treating Lakeisha Waldrop/Extender: Fredderick Severance Weeks in Treatment: 16 Active Problems Location of Pain Severity and Description of Pain Patient Has Paino No Site Locations Rate the pain. Current Pain Level: 0 Pain Management and Medication Current Pain Management: Medication: No Cold Application: No Rest: No Massage: No Activity: No T.E.N.S.: No Heat Application: No Leg drop or elevation: No Is the Current Pain Management Adequate: Adequate How does your wound impact your activities of daily livingo Sleep: No Bathing: No Appetite: No Relationship With Others: No Bladder Continence: No Emotions: No Bowel Continence: No Work: No Toileting: No Drive: No Dressing: No Hobbies: No Engineer, maintenance) Signed: 10/30/2020 5:40:08 PM By: Deon Pilling RN, BSN Entered By: Deon Pilling on 10/30/2020 15:01:11 -------------------------------------------------------------------------------- Patient/Caregiver Education Details Patient Name: Date of Service: Wayne Pratt, [place] [facility] 10/20/2022andnbsp2:30 PM Medical Record Number: [id number] Patient Account Number: [id number] Date of Birth/Gender: Treating RN: 12-25-1960 (60 y.o. Wayne Pratt Primary Care Physician: [name] Other Clinician: Referring Physician: Treating Physician/Extender: Christel Mormon in Treatment: 16 Education Assessment Education Provided To: Patient Education Topics Provided Elevated Blood Sugar/ Impact on Healing: Handouts: Elevated  Blood Sugars: How Do They Affect Wound Healing Methods: Explain/Verbal Responses: Reinforcements needed Electronic Signature(s) Signed: 10/30/2020 5:40:08 PM By: Deon Pilling RN, BSN Entered By: Deon Pilling on 10/30/2020 15:42:26 -------------------------------------------------------------------------------- Wound Assessment Details Patient Name: Date of Service: Wayne Pratt, [name] 10/30/2020 2:30 PM Medical Record Number: [id number] Patient Account Number: [id number] Date of Birth/Sex: Treating RN: 1960/11/24 (60 y.o. Wayne Pratt Primary Care Ayrton Mcvay: [name] Other Clinician: Referring Brook Geraci: Treating Catera Hankins/Extender: Fredderick Severance Weeks in Treatment: 16 Wound Status Wound Number: 1 Primary Diabetic Wound/Ulcer of the Lower Extremity Etiology: Wound Location: Right, Posterior Lower Leg Wound Open Wounding Event: Surgical Injury Status: Date Acquired: 02/04/2020 Comorbid Anemia, Asthma, Sleep Apnea, Hypertension, Peripheral Arterial Weeks Of Treatment: 16 History: Disease, Type II Diabetes, Rheumatoid Arthritis, Neuropathy Clustered Wound: No Photos Wound Measurements Length: (cm) 0.1 Width: (cm) 0.1 Depth: (cm) 0.1 Area: (cm) 0.008 Volume: (cm) 0.001 % Reduction in Area: 100% % Reduction in Volume: 100% Epithelialization: Large (67-100%) Tunneling: No Undermining: No Wound Description Classification: Grade 2 Wound Margin: Distinct, outline attached Exudate Amount: None Present Foul Odor After Cleansing: No Slough/Fibrino Yes Wound Bed Granulation Amount: None Present (0%) Exposed Structure Necrotic Amount: None Present (0%) Fascia Exposed: No Fat Layer (Subcutaneous Tissue) Exposed: Yes Tendon Exposed: No Muscle Exposed: No Joint Exposed: No Bone Exposed: No Treatment Notes Wound #1 (Lower Leg) Wound Laterality: Right, Posterior Cleanser Soap and Water Discharge Instruction: May shower and wash wound with dial  antibacterial soap and water prior to dressing change. Wound Cleanser Discharge Instruction: Cleanse the wound with wound cleanser prior to applying a clean dressing using gauze sponges, not tissue or  cotton balls. Peri-Wound Care Sween Lotion (Moisturizing lotion) Discharge Instruction: Apply moisturizing lotion as directed Topical Primary Dressing PolyMem Silver Non-Adhesive Dressing, 4.25x4.25 in Discharge Instruction: Apply to wound bed as instructed Secondary Dressing ABD Pad, 5x9 Discharge Instruction: Apply over primary dressing as directed. Secured With Compression Wrap ThreePress (3 layer compression wrap) Discharge Instruction: Apply three layer compression as directed. Compression Stockings Add-Ons Electronic Signature(s) Signed: 10/30/2020 5:40:08 PM By: Deon Pilling RN, BSN Entered By: Deon Pilling on 10/30/2020 15:10:42 -------------------------------------------------------------------------------- Vitals Details Patient Name: Date of Service: Wayne Pratt, [name] 10/30/2020 2:30 PM Medical Record Number: [id number] Patient Account Number: [id number] Date of Birth/Sex: Treating RN: 1960/12/06 (60 y.o. Wayne Pratt Primary Care Raenah Murley: [name] Other Clinician: Referring Amilliana Hayworth: Treating Genita Nilsson/Extender: Fredderick Severance Weeks in Treatment: 16 Vital Signs Time Taken: 15:00 Temperature (F): 99 Height (in): 73 Pulse (bpm): 111 Weight (lbs): 215 Respiratory Rate (breaths/min): 18 Body Mass Index (BMI): 28.4 Blood Pressure (mmHg): 113/74 Reference Range: 80 - 120 mg / dl Electronic Signature(s) Signed: 10/30/2020 5:40:08 PM By: Deon Pilling RN, BSN Entered By: Deon Pilling on 10/30/2020 15:01:03

## 2020-11-04 NOTE — Progress Notes (Signed)
Wayne Pratt, Wayne Pratt ([id number]) Visit Report for 10/23/2020 Arrival Information Details Patient Name: Date of Service: Wayne Pratt. 10/23/2020 2:00 PM Medical Record Number: [id number] Patient Account Number: [id number] Date of Birth/Sex: Treating RN: 04-06-1960 (60 y.o. [name], Meta.Reding Primary Care Kahdijah Errickson: [name] Other Clinician: Referring Johnavon Mcclafferty: Treating Aubrianne Molyneux/Extender: Christel Mormon in Treatment: 15 Visit Information History Since Last Visit Added or deleted any medications: No Patient Arrived: Ambulatory Any new allergies or adverse reactions: No Arrival Time: 13:46 Had a fall or experienced change in No Accompanied By: wife activities of daily living that may affect Transfer Assistance: None risk of falls: Patient Identification Verified: Yes Signs or symptoms of abuse/neglect since last visito No Secondary Verification Process Completed: Yes Hospitalized since last visit: No Patient Requires Transmission-Based Precautions: No Implantable device outside of the clinic excluding No Patient Has Alerts: Yes cellular tissue based products placed in the center Patient Alerts: Patient on Blood Thinner since last visit: ABI's: R:1.05 03/22 Has Dressing in Place as Prescribed: Yes Pain Present Now: No Electronic Signature(s) Signed: 11/04/2020 2:37:05 PM By: Sandre Kitty Entered By: Sandre Kitty on 10/23/2020 13:47:27 -------------------------------------------------------------------------------- Compression Therapy Details Patient Name: Date of Service: Wayne Pratt, Wayne RY W. 10/23/2020 2:00 PM Medical Record Number: [id number] Patient Account Number: [id number] Date of Birth/Sex: Treating RN: 1960-05-08 (60 y.o. Wayne Pratt Primary Care Marilena Trevathan: [name] Other Clinician: Referring Janna Oak: Treating Brevyn Ring/Extender: Fredderick Severance Weeks in Treatment: 15 Compression Therapy Performed for Wound  Assessment: Wound #1 Right,Posterior Lower Leg Performed By: Clinician [name], RN Compression Type: Three Layer Post Procedure Diagnosis Same as Pre-procedure Electronic Signature(s) Signed: 10/23/2020 4:45:09 PM By: [name] RN Entered By: [name] on 10/23/2020 14:18:37 -------------------------------------------------------------------------------- Encounter Discharge Information Details Patient Name: Date of Service: Wayne Pratt, Wayne RY W. 10/23/2020 2:00 PM Medical Record Number: [id number] Patient Account Number: [id number] Date of Birth/Sex: Treating RN: 15-Jun-1960 (60 y.o. Wayne Pratt Primary Care Remer Couse: [name] Other Clinician: Referring Lamond Glantz: Treating Raunak Antuna/Extender: Christel Mormon in Treatment: 15 Encounter Discharge Information Items Discharge Condition: Stable Ambulatory Status: Wheelchair Discharge Destination: Home Transportation: Private Auto Accompanied By: wife Schedule Follow-up Appointment: Yes Clinical Summary of Care: Patient Declined Electronic Signature(s) Signed: 10/23/2020 4:45:09 PM By: [name] RN Entered By: [name] on 10/23/2020 14:25:55 -------------------------------------------------------------------------------- Lower Extremity Assessment Details Patient Name: Date of Service: Wayne Pratt, Wayne RY W. 10/23/2020 2:00 PM Medical Record Number: [id number] Patient Account Number: [id number] Date of Birth/Sex: Treating RN: [birth year] (60 y.o. Wayne Pratt, Wayne Pratt Primary Care Tarsha Blando: [name] Other Clinician: Referring Rilya Longo: Treating Kalub Morillo/Extender: Fredderick Severance Weeks in Treatment: 15 Edema Assessment Assessed: Wayne Pratt: No] Wayne Pratt: Yes] Edema: [Left: Ye] [Right: s] Calf Left: Right: Point of Measurement: 34 cm From Medial Instep 37.3 cm Ankle Left: Right: Point of Measurement: 9 cm From Medial Instep 22 cm Vascular  Assessment Pulses: Dorsalis Pedis Palpable: [Right:Yes] Posterior Tibial Palpable: [Right:Yes] Electronic Signature(s) Signed: 10/23/2020 4:45:09 PM By: [name] RN Entered By: [name] on 10/23/2020 14:18:21 -------------------------------------------------------------------------------- Multi Wound Chart Details Patient Name: Date of Service: Wayne Pratt, Wayne RY W. 10/23/2020 2:00 PM Medical Record Number: [id number] Patient Account Number: [id number] Date of Birth/Sex: Treating RN: 05/24/1960 (60 y.o. Wayne Pratt Primary Care Michella Detjen: [name] Other Clinician: Referring Shonia Skilling: Treating Camaryn Lumbert/Extender: Fredderick Severance Weeks in Treatment: 15 Vital Signs Height(in): 73 Pulse(bpm): 94 Weight(lbs): 215 Blood Pressure(mmHg): 137/80 Body Mass Index(BMI): 28 Temperature(F): 98.4 Respiratory Rate(breaths/min): 18 Photos: [  N/A:N/A] Right, Posterior Lower Leg N/A N/A Wound Location: Surgical Injury N/A N/A Wounding Event: Diabetic Wound/Ulcer of the Lower N/A N/A Primary Etiology: Extremity Anemia, Asthma, Sleep Apnea, N/A N/A Comorbid History: Hypertension, Peripheral Arterial Disease, Type II Diabetes, Rheumatoid Arthritis, Neuropathy 02/04/2020 N/A N/A Date Acquired: 15 N/A N/A Weeks of Treatment: Open N/A N/A Wound Status: 0.1x0.1x0.1 N/A N/A Measurements L x W x D (cm) 0.008 N/A N/A A (cm) : rea 0.001 N/A N/A Volume (cm) : 100.00% N/A N/A % Reduction in A rea: 100.00% N/A N/A % Reduction in Volume: Grade 2 N/A N/A Classification: Small N/A N/A Exudate A mount: Serosanguineous N/A N/A Exudate Type: red, brown N/A N/A Exudate Color: Distinct, outline attached N/A N/A Wound Margin: Large (67-100%) N/A N/A Granulation A mount: Pink, Pale N/A N/A Granulation Quality: None Present (0%) N/A N/A Necrotic A mount: Fat Layer (Subcutaneous Tissue): Yes N/A N/A Exposed Structures: Fascia: No Tendon:  No Muscle: No Joint: No Bone: No Large (67-100%) N/A N/A Epithelialization: Compression Therapy N/A N/A Procedures Performed: Treatment Notes Wound #1 (Lower Leg) Wound Laterality: Right, Posterior Cleanser Normal Saline Discharge Instruction: Cleanse the wound with Normal Saline prior to applying a clean dressing using gauze sponges, not tissue or cotton balls. Soap and Water Discharge Instruction: May shower and wash wound with dial antibacterial soap and water prior to dressing change. Peri-Wound Care Sween Lotion (Moisturizing lotion) Discharge Instruction: Apply moisturizing lotion as directed Topical Primary Dressing KerraCel Ag Gelling Fiber Dressing, 4x5 in (silver alginate) Discharge Instruction: Apply silver alginate to wound bed as instructed Secondary Dressing Woven Gauze Sponge, Non-Sterile 4x4 in Discharge Instruction: Apply over primary dressing as directed. Secured With Compression Wrap ThreePress (3 layer compression wrap) Discharge Instruction: Apply three layer compression as directed. Compression Stockings Add-Ons Electronic Signature(s) Signed: 10/23/2020 4:49:53 PM By: [name] Signed: 10/23/2020 5:46:14 PM By: Deon Pilling RN, BSN Entered By: Linton Ham on 10/23/2020 16:37:22 -------------------------------------------------------------------------------- Multi-Disciplinary Care Plan Details Patient Name: Date of Service: Wayne Pratt, Wayne RY W. 10/23/2020 2:00 PM Medical Record Number: [id number] Patient Account Number: [id number] Date of Birth/Sex: Treating RN: 06-Aug-1960 (60 y.o. Wayne Pratt, Wayne Pratt Primary Care Genesys Coggeshall: [name] Other Clinician: Referring Alani Sabbagh: Treating Meenakshi Sazama/Extender: Christel Mormon in Treatment: [place] reviewed with physician Active Inactive Nutrition Nursing Diagnoses: Impaired glucose control: actual or potential Potential for alteratiion in  Nutrition/Potential for imbalanced nutrition Goals: Patient/caregiver will maintain therapeutic glucose control Date Initiated: 07/04/2020 Target Resolution Date: 11/05/2020 Goal Status: Active Interventions: Assess HgA1c results as ordered upon admission and as needed Provide education on elevated blood sugars and impact on wound healing Provide education on nutrition Treatment Activities: Education provided on Nutrition : 09/16/2020 Patient referred to Primary Care Physician for further nutritional evaluation : 07/04/2020 Notes: Wound/Skin Impairment Nursing Diagnoses: Impaired tissue integrity Knowledge deficit related to ulceration/compromised skin integrity Goals: Patient/caregiver will verbalize understanding of skin care regimen Date Initiated: 07/04/2020 Target Resolution Date: 11/05/2020 Goal Status: Active Ulcer/skin breakdown will have a volume reduction of 30% by week 4 Date Initiated: 07/04/2020 Date Inactivated: 08/28/2020 Target Resolution Date: 08/08/2020 Goal Status: Met Interventions: Assess patient/caregiver ability to obtain necessary supplies Assess patient/caregiver ability to perform ulcer/skin care regimen upon admission and as needed Assess ulceration(s) every visit Provide education on ulcer and skin care Treatment Activities: Skin care regimen initiated : 07/04/2020 Topical wound management initiated : 07/04/2020 Notes: Electronic Signature(s) Signed: 10/23/2020 4:45:09 PM By: [name] RN Entered By: [name] on 10/23/2020 14:20:16 -------------------------------------------------------------------------------- Pain  Assessment Details Patient Name: Date of Service: Wayne Pratt. 10/23/2020 2:00 PM Medical Record Number: [id number] Patient Account Number: [id number] Date of Birth/Sex: Treating RN: 1960/10/29 (60 y.o. Wayne Pratt Primary Care Nocholas Damaso: [name] Other Clinician: Referring Day Deery: Treating  Ildefonso Keaney/Extender: Fredderick Severance Weeks in Treatment: 15 Active Problems Location of Pain Severity and Description of Pain Patient Has Paino No Site Locations Pain Management and Medication Current Pain Management: Electronic Signature(s) Signed: 10/23/2020 5:46:14 PM By: Deon Pilling RN, BSN Signed: 11/04/2020 2:37:05 PM By: Sandre Kitty Entered By: Sandre Kitty on 10/23/2020 13:48:11 -------------------------------------------------------------------------------- Patient/Caregiver Education Details Patient Name: Date of Service: Wayne Pratt, [facility]. 10/13/2022andnbsp2:00 PM Medical Record Number: [id number] Patient Account Number: [id number] Date of Birth/Gender: Treating RN: 28-May-1960 (60 y.o. Wayne Pratt Primary Care Physician: [name] Other Clinician: Referring Physician: Treating Physician/Extender: Christel Mormon in Treatment: 15 Education Assessment Education Provided To: Patient Education Topics Provided Elevated Blood Sugar/ Impact on Healing: Methods: Explain/Verbal Responses: State content correctly Nutrition: Methods: Explain/Verbal Responses: State content correctly Wound/Skin Impairment: Methods: Explain/Verbal Responses: State content correctly Electronic Signature(s) Signed: 10/23/2020 4:45:09 PM By: [name] RN Entered By: [name] on 10/23/2020 14:24:58 -------------------------------------------------------------------------------- Wound Assessment Details Patient Name: Date of Service: Wayne Pratt, Wayne RY W. 10/23/2020 2:00 PM Medical Record Number: [id number] Patient Account Number: [id number] Date of Birth/Sex: Treating RN: [birth year] (60 y.o. Wayne Pratt Primary Care Orie Cuttino: [name] Other Clinician: Referring Lavetta Geier: Treating Jossalin Chervenak/Extender: Fredderick Severance Weeks in Treatment: 15 Wound Status Wound Number: 1 Primary Diabetic  Wound/Ulcer of the Lower Extremity Etiology: Wound Location: Right, Posterior Lower Leg Wound Open Wounding Event: Surgical Injury Status: Date Acquired: 02/04/2020 Comorbid Anemia, Asthma, Sleep Apnea, Hypertension, Peripheral Arterial Weeks Of Treatment: 15 History: Disease, Type II Diabetes, Rheumatoid Arthritis, Neuropathy Clustered Wound: No Photos Wound Measurements Length: (cm) 0.1 Width: (cm) 0.1 Depth: (cm) 0.1 Area: (cm) 0.008 Volume: (cm) 0.001 % Reduction in Area: 100% % Reduction in Volume: 100% Epithelialization: Large (67-100%) Wound Description Classification: Grade 2 Wound Margin: Distinct, outline attached Exudate Amount: Small Exudate Type: Serosanguineous Exudate Color: red, brown Foul Odor After Cleansing: No Slough/Fibrino No Wound Bed Granulation Amount: Large (67-100%) Exposed Structure Granulation Quality: Pink, Pale Fascia Exposed: No Necrotic Amount: None Present (0%) Fat Layer (Subcutaneous Tissue) Exposed: Yes Tendon Exposed: No Muscle Exposed: No Joint Exposed: No Bone Exposed: No Electronic Signature(s) Signed: 10/23/2020 5:46:14 PM By: Deon Pilling RN, BSN Signed: 11/04/2020 2:37:05 PM By: Sandre Kitty Entered By: Sandre Kitty on 10/23/2020 13:53:07 -------------------------------------------------------------------------------- Vitals Details Patient Name: Date of Service: Wayne Lysbeth Penner, Wayne RY W. 10/23/2020 2:00 PM Medical Record Number: [id number] Patient Account Number: [id number] Date of Birth/Sex: Treating RN: 01-Sep-1960 (61 y.o. Wayne Pratt Primary Care Jahzier Villalon: [name] Other Clinician: Referring Daya Dutt: Treating Ashawn Rinehart/Extender: Fredderick Severance Weeks in Treatment: 15 Vital Signs Time Taken: 13:47 Temperature (F): 98.4 Height (in): 73 Pulse (bpm): 94 Weight (lbs): 215 Respiratory Rate (breaths/min): 18 Body Mass Index (BMI): 28.4 Blood Pressure (mmHg): 137/80 Reference Range:  80 - 120 mg / dl Electronic Signature(s) Signed: 11/04/2020 2:37:05 PM By: Sandre Kitty Entered By: Sandre Kitty on 10/23/2020 13:47:54

## 2020-11-06 ENCOUNTER — Encounter (HOSPITAL_BASED_OUTPATIENT_CLINIC_OR_DEPARTMENT_OTHER): Payer: PPO | Admitting: Internal Medicine

## 2020-11-06 ENCOUNTER — Other Ambulatory Visit: Payer: Self-pay

## 2020-11-06 DIAGNOSIS — E1151 Type 2 diabetes mellitus with diabetic peripheral angiopathy without gangrene: Secondary | ICD-10-CM | POA: Diagnosis not present

## 2020-11-06 DIAGNOSIS — L97212 Non-pressure chronic ulcer of right calf with fat layer exposed: Secondary | ICD-10-CM | POA: Diagnosis not present

## 2020-11-06 DIAGNOSIS — E11621 Type 2 diabetes mellitus with foot ulcer: Secondary | ICD-10-CM | POA: Diagnosis not present

## 2020-11-11 NOTE — Progress Notes (Signed)
Wayne Pratt, Wayne Pratt ([id number]) Visit Report for 11/06/2020 Arrival Information Details Patient Name: Date of Service: Wayne Pratt 11/06/2020 2:30 PM Medical Record Number: [id number] Patient Account Number: [id number] Date of Birth/Sex: Treating RN: 1960/11/01 (60 y.o. Wayne Pratt, Meta.Reding Primary Care Finnley Lewis: [name] Other Clinician: Referring Haywood Meinders: Treating Sherissa Tenenbaum/Extender: Christel Mormon in Treatment: 31 Visit Information History Since Last Visit Added or deleted any medications: No Patient Arrived: Ambulatory Any new allergies or adverse reactions: No Arrival Time: 14:56 Had a fall or experienced change in No Accompanied By: spouse activities of daily living that may affect Transfer Assistance: None risk of falls: Patient Requires Transmission-Based Precautions: No Signs or symptoms of abuse/neglect since last visito No Patient Has Alerts: Yes Hospitalized since last visit: No Patient Alerts: Patient on Blood Thinner Implantable device outside of the clinic excluding No ABI's: R:1.05 03/22 cellular tissue based products placed in the center since last visit: Has Dressing in Place as Prescribed: Yes Has Compression in Place as Prescribed: Yes Pain Present Now: No Electronic Signature(s) Signed: 11/06/2020 5:01:05 PM By: Dellie Catholic RN Entered By: Dellie Catholic on 11/06/2020 15:02:14 -------------------------------------------------------------------------------- Compression Therapy Details Patient Name: Date of Service: Wayne Pratt, Wayne Pray W. 11/06/2020 2:30 PM Medical Record Number: [id number] Patient Account Number: [id number] Date of Birth/Sex: Treating RN: 1960/02/01 (60 y.o. Wayne Pratt Primary Care Leather Estis: [name] Other Clinician: Referring Jowanda Heeg: Treating Wayne Pratt/Extender: Fredderick Severance Weeks in Treatment: 17 Compression Therapy Performed for Wound Assessment: Wound #1 Right,Posterior Lower  Leg Performed By: Clinician Deon Pilling, RN Compression Type: Three Layer Post Procedure Diagnosis Same as Pre-procedure Electronic Signature(s) Signed: 11/06/2020 5:01:05 PM By: Dellie Catholic RN Entered By: Dellie Catholic on 11/06/2020 15:36:15 -------------------------------------------------------------------------------- Lower Extremity Assessment Details Patient Name: Date of Service: Wayne Pratt. 11/06/2020 2:30 PM Medical Record Number: [id number] Patient Account Number: [id number] Date of Birth/Sex: Treating RN: 08/11/1960 (60 y.o. [name] Primary Care Tashianna Broome: [name] Other Clinician: Referring Aakash Hollomon: Treating Marya Lowden/Extender: Fredderick Severance Weeks in Treatment: 17 Edema Assessment Assessed: [Left: No] [Right: No] Edema: [Left: N] [Right: o] Calf Left: Right: Point of Measurement: 34 cm From Medial Instep 38.9 cm Ankle Left: Right: Point of Measurement: 9 cm From Medial Instep 22.9 cm Vascular Assessment Pulses: Dorsalis Pedis Palpable: [Right:Yes] Electronic Signature(s) Signed: 11/06/2020 5:45:17 PM By: Levan Hurst RN, BSN Entered By: Levan Hurst on 11/06/2020 15:15:13 -------------------------------------------------------------------------------- Multi Wound Chart Details Patient Name: Date of Service: Wayne Pratt, [name] 11/06/2020 2:30 PM Medical Record Number: [id number] Patient Account Number: [id number] Date of Birth/Sex: Treating RN: [birth year] (60 y.o. Wayne Pratt Primary Care Audryna Wendt: [name] Other Clinician: Referring Doug Bucklin: Treating Sula Fetterly/Extender: Fredderick Severance Weeks in Treatment: 17 Vital Signs Height(in): 73 Pulse(bpm): 83 Weight(lbs): 215 Blood Pressure(mmHg): 145/79 Body Mass Index(BMI): 28 Temperature(F): 98.4 Respiratory Rate(breaths/min): 18 Photos: [1:Right, Posterior Lower Leg] [N/A:N/A N/A] Wound Location: [1:Surgical Injury]  [N/A:N/A] Wounding Event: [1:Diabetic Wound/Ulcer of the Lower] [N/A:N/A] Primary Etiology: [1:Extremity Anemia, Asthma, Sleep Apnea,] [N/A:N/A] Comorbid History: [1:Hypertension, Peripheral Arterial Disease, Type II Diabetes, Rheumatoid Arthritis, Neuropathy 02/04/2020] [N/A:N/A] Date Acquired: [1:17] [N/A:N/A] Weeks of Treatment: [1:Open] [N/A:N/A] Wound Status: [1:1x1x0.1] [N/A:N/A] Measurements L x W x D (cm) [1:0.785] [N/A:N/A] A (cm) : rea [1:0.079] [N/A:N/A] Volume (cm) : [1:99.50%] [N/A:N/A] % Reduction in A [1:rea: 99.50%] [N/A:N/A] % Reduction in Volume: [1:Grade 2] [N/A:N/A] Classification: [1:Small] [N/A:N/A] Exudate A mount: [1:Serosanguineous] [N/A:N/A] Exudate Type: [1:red, brown] [N/A:N/A] Exudate Color: [1:Distinct, outline attached] [  N/A:N/A] Wound Margin: [1:Large (67-100%)] [N/A:N/A] Granulation A mount: [1:Pink] [N/A:N/A] Granulation Quality: [1:None Present (0%)] [N/A:N/A] Necrotic A mount: [1:Fat Layer (Subcutaneous Tissue): Yes N/A] Exposed Structures: [1:Fascia: No Tendon: No Muscle: No Joint: No Bone: No Large (67-100%)] [N/A:N/A] Epithelialization: [1:Debridement - Selective/Open Wound N/A] Debridement: Pre-procedure Verification/Time Out 15:23 [N/A:N/A] Taken: [1:Skin/Epidermis] [N/A:N/A] Level: [1:1] [N/A:N/A] Debridement A (sq cm): [1:rea Curette] [N/A:N/A] Instrument: [1:Minimum] [N/A:N/A] Bleeding: [1:Pressure] [N/A:N/A] Hemostasis Achieved: [1:0] [N/A:N/A] Procedural Pain: [1:0] [N/A:N/A] Post Procedural Pain: [1:Procedure was tolerated well] [N/A:N/A] Debridement Treatment Response: [1:1x1x0.1] [N/A:N/A] Post Debridement Measurements L x W x D (cm) [1:0.079] [N/A:N/A] Post Debridement Volume: (cm) [1:Compression Therapy] [N/A:N/A] Procedures Performed: [1:Debridement] Treatment Notes Electronic Signature(s) Signed: 11/06/2020 5:43:20 PM By: Deon Pilling RN, BSN Signed: 11/11/2020 4:29:54 PM By: [name] Entered By: Linton Ham on 11/06/2020 15:49:09 -------------------------------------------------------------------------------- Multi-Disciplinary Care Plan Details Patient Name: Date of Service: Wayne Pratt, [name] 11/06/2020 2:30 PM Medical Record Number: [id number] Patient Account Number: [id number] Date of Birth/Sex: Treating RN: 17-May-1960 (60 y.o. [name] Primary Care Baelynn Schmuhl: [name] Other Clinician: Referring Ceceilia Cephus: Treating Akira Perusse/Extender: Christel Mormon in Treatment: [place] reviewed with physician Active Inactive Nutrition Nursing Diagnoses: Impaired glucose control: actual or potential Potential for alteratiion in Nutrition/Potential for imbalanced nutrition Goals: Patient/caregiver will maintain therapeutic glucose control Date Initiated: 07/04/2020 Target Resolution Date: 12/05/2020 Goal Status: Active Interventions: Assess HgA1c results as ordered upon admission and as needed Provide education on elevated blood sugars and impact on wound healing Provide education on nutrition Treatment Activities: Education provided on Nutrition : 09/16/2020 Patient referred to Primary Care Physician for further nutritional evaluation : 07/04/2020 Notes: Wound/Skin Impairment Nursing Diagnoses: Impaired tissue integrity Knowledge deficit related to ulceration/compromised skin integrity Goals: Patient/caregiver will verbalize understanding of skin care regimen Date Initiated: 07/04/2020 Target Resolution Date: 12/05/2020 Goal Status: Active Ulcer/skin breakdown will have a volume reduction of 30% by week 4 Date Initiated: 07/04/2020 Date Inactivated: 08/28/2020 Target Resolution Date: 08/08/2020 Goal Status: Met Interventions: Assess patient/caregiver ability to obtain necessary supplies Assess patient/caregiver ability to perform ulcer/skin care regimen upon admission and as needed Assess ulceration(s) every visit Provide  education on ulcer and skin care Treatment Activities: Skin care regimen initiated : 07/04/2020 Topical wound management initiated : 07/04/2020 Notes: Electronic Signature(s) Signed: 11/06/2020 5:45:17 PM By: Levan Hurst RN, BSN Entered By: Levan Hurst on 11/06/2020 17:25:34 -------------------------------------------------------------------------------- Pain Assessment Details Patient Name: Date of Service: Wayne Pratt, [name] 11/06/2020 2:30 PM Medical Record Number: [id number] Patient Account Number: [id number] Date of Birth/Sex: Treating RN: [birth year] (60 y.o. Wayne Pratt Primary Care Adisyn Ruscitti: [name] Other Clinician: Referring Kerin Kren: Treating David Towson/Extender: Fredderick Severance Weeks in Treatment: 17 Active Problems Location of Pain Severity and Description of Pain Patient Has Paino No Site Locations Rate the pain. Rate the pain. Current Pain Level: 0 Pain Management and Medication Current Pain Management: Medication: No Cold Application: No Rest: No Massage: No Activity: No T.E.N.S.: No Heat Application: No Leg drop or elevation: No Is the Current Pain Management Adequate: Adequate How does your wound impact your activities of daily livingo Sleep: No Bathing: No Appetite: No Relationship With Others: No Bladder Continence: No Emotions: No Bowel Continence: No Work: No Toileting: No Drive: No Dressing: No Hobbies: No Electronic Signature(s) Signed: 11/06/2020 5:01:05 PM By: Dellie Catholic RN Signed: 11/06/2020 5:43:20 PM By: Deon Pilling RN, BSN Entered By: Dellie Catholic on 11/06/2020 15:03:52 -------------------------------------------------------------------------------- Patient/Caregiver Education Details Patient Name: Date of  Service: Wayne Pratt 10/27/2022andnbsp2:30 PM Medical Record Number: [id number] Patient Account Number: [id number] Date of Birth/Gender: Treating RN: 1960-02-17 (60 y.o. [name] Primary Care Physician: [name] Other Clinician: Referring Physician: Treating Physician/Extender: Christel Mormon in Treatment: 17 Education Assessment Education Provided To: Patient Education Topics Provided Wound/Skin Impairment: Methods: Explain/Verbal Responses: State content correctly [employer]) Signed: 11/06/2020 5:45:17 PM By: Levan Hurst RN, BSN Entered By: Levan Hurst on 11/06/2020 17:25:50 -------------------------------------------------------------------------------- Wound Assessment Details Patient Name: Date of Service: [name], [name] 11/06/2020 2:30 PM Medical Record Number: [id number] Patient Account Number: [id number] Date of Birth/Sex: Treating RN: 1960/09/13 (60 y.o. [name] Primary Care Provider: [name] Other Clinician: Referring Provider: Treating Provider/Extender: Fredderick Severance Weeks in Treatment: 17 Wound Status Wound Number: 1 Primary Diabetic Wound/Ulcer of the Lower Extremity Etiology: Wound Location: Right, Posterior Lower Leg Wound Open Wounding Event: Surgical Injury Status: Date Acquired: 02/04/2020 Comorbid Anemia, Asthma, Sleep Apnea, Hypertension, Peripheral Arterial Weeks Of Treatment: 17 History: Disease, Type II Diabetes, Rheumatoid Arthritis, Neuropathy Clustered Wound: No Photos Wound Measurements Length: (cm) 1 Width: (cm) 1 Depth: (cm) 0.1 Area: (cm) 0.785 Volume: (cm) 0.079 % Reduction in Area: 99.5% % Reduction in Volume: 99.5% Epithelialization: Large (67-100%) Tunneling: No Undermining: No Wound Description Classification: Grade 2 Wound Margin: Distinct, outline attached Exudate Amount: Small Exudate Type: Serosanguineous Exudate Color: red, brown Foul Odor After Cleansing: No Slough/Fibrino No Wound Bed Granulation Amount: Large (67-100%) Exposed Structure Granulation Quality: Pink Fascia Exposed: No Necrotic  Amount: None Present (0%) Fat Layer (Subcutaneous Tissue) Exposed: Yes Tendon Exposed: No Muscle Exposed: No Joint Exposed: No Bone Exposed: No Electronic Signature(s) Signed: 11/06/2020 5:45:17 PM By: Levan Hurst RN, BSN Entered By: Levan Hurst on 11/06/2020 15:28:51 -------------------------------------------------------------------------------- Vitals Details Patient Name: Date of Service: [name], [name] 11/06/2020 2:30 PM Medical Record Number: [id number] Patient Account Number: [id number] Date of Birth/Sex: Treating RN: 10-24-1960 (60 y.o. Wayne Pratt Primary Care Provider: [name] Other Clinician: Referring Provider: Treating Provider/Extender: Fredderick Severance Weeks in Treatment: 17 Vital Signs Time Taken: 15:00 Temperature (F): 98.4 Height (in): 73 Pulse (bpm): 83 Weight (lbs): 215 Respiratory Rate (breaths/min): 18 Body Mass Index (BMI): 28.4 Blood Pressure (mmHg): 145/79 Reference Range: 80 - 120 mg / dl Electronic Signature(s) Signed: 11/06/2020 5:01:05 PM By: Dellie Catholic RN Entered By: Dellie Catholic on 11/06/2020 15:02:51

## 2020-11-11 NOTE — Progress Notes (Signed)
ARCHER, MOIST ([id number]) Visit Report for 11/06/2020 Debridement Details Patient Name: Date of Service: Wayne Pratt. 11/06/2020 2:30 PM Medical Record Number: [id number] Patient Account Number: [id number] Date of Birth/Sex: Treating RN: Dec 14, 1960 (60 y.o. Wayne Pratt Primary Care Provider: [name] Other Clinician: Referring Provider: Treating Provider/Extender: Christel Mormon in Treatment: 17 Debridement Performed for Assessment: Wound #1 Right,Posterior Lower Leg Performed By: Physician Ricard Dillon., MD Debridement Type: Debridement Severity of Tissue Pre Debridement: Fat layer exposed Level of Consciousness (Pre-procedure): Awake and Alert Pre-procedure Verification/Time Out Yes - 15:23 Taken: Start Time: 15:23 T Area Debrided (L x W): otal 1 (cm) x 1 (cm) = 1 (cm) Tissue and other material debrided: Non-Viable, Skin: Dermis , Skin: Epidermis Level: Skin/Epidermis Debridement Description: Selective/Open Wound Instrument: Curette Bleeding: Minimum Hemostasis Achieved: Pressure End Time: 15:24 Procedural Pain: 0 Post Procedural Pain: 0 Response to Treatment: Procedure was tolerated well Level of Consciousness (Post- Awake and Alert procedure): Post Debridement Measurements of Total Wound Length: (cm) 1 Width: (cm) 1 Depth: (cm) 0.1 Volume: (cm) 0.079 Character of Wound/Ulcer Post Debridement: Improved Severity of Tissue Post Debridement: Fat layer exposed Post Procedure Diagnosis Same as Pre-procedure Electronic Signature(s) Signed: 11/06/2020 5:43:20 PM By: Deon Pilling RN, BSN Signed: 11/11/2020 4:29:54 PM By: [name] Entered By: Linton Ham on 11/06/2020 15:49:24 -------------------------------------------------------------------------------- HPI Details Patient Name: Date of Service: Wayne Pratt, [name] 11/06/2020 2:30 PM Medical Record Number: [id number] Patient Account Number: [id number] Date of  Birth/Sex: Treating RN: 12-30-1960 (60 y.o. Wayne Pratt Primary Care Provider: [name] Other Clinician: Referring Provider: Treating Provider/Extender: Fredderick Severance Weeks in Treatment: 17 History of Present Illness HPI Description: ADMISSION 07/04/2020 This is a 60 year old woman who presented to Dr. Carlis Abbott in January of this year having critical limb ischemia. First angiogram was on 01/31/2020 showed proximal profound disease in the SFA with right foot runoff via diseased posterior tibia. He underwent a right femoral endarterectomy with a right common femoral to below-knee popliteal bypass on 02/04/2020. There were generally good vascular results however the day after the surgery according to his wife he developed a black eschar at presumably ischemic spot on the right posterior calf. He also ultimately had amputations on 02/29/2020 of the right first second and fifth toes in March she had a wound VAC on the right calf but this was too painful and was switched to wet-to-dry. At 1 point this clearly had exposed muscle and the wife was able to document this by showing me short movie on his phone. He had an operative debridement on the right calf in early April with a skin substitute oMyriad. In any case none of this really made any difference. He was hospitalized early in June with bilateral pulmonary emboli and is now on Eliquis. Long medical problem list but predominantly type 2 diabetes with peripheral neuropathy, spinal stenosis. He is a smoker Most recent arterial studies were done on 03/18/2020 on the right this showed a an ABI in the posterior tibial of 1.05 with biphasic waveforms on the left noncompressible at 1.38 but with a great toe pressure near normal at 1.11 again with triphasic waveforms. 6/29; patient arrives back in clinic. We use Santyl on the posterior calf wound on the right. Arrives in clinic with this looking a lot better. Healthy red  granulated tissue over the majority of this. 7/7 the wound is cleaned up quite nicely I think it is time to change from [facility]  to [facility]. They are using a local wrap here. There is edema in his upper lower leg. 7/21; using Hydrofera Blue. Nice improvement in surface area they are changing this every 2d 8/18; using Hydrofera Blue and bordered foam. Nice improvement in surface area 9/1; patient has some degree of lymphedema in the right leg we have not been wrapping him using Hydrofera Blue and bordered foam making decent progress in surface area. T oday he arrives with some erythema below the wound scaly skin this there is some form of contact dermatitis perhaps stasis dermatitis. I am not thinking that this is actually bacterial cellulitis as it is neither warm nor tender. The wound itself actually continues to look quite good 9/6; the patient's lymphedema in the right leg is considerably improved with compression. Also result of this the wound area is much better. We have been using Hydrofera Blue now under compression 9/14; still good edema control. Wound is smaller we have been using Hydrofera Blue for a prolonged period of time 9/20; still good edema control. Wound is slightly smaller but with a much healthier surface I changed to Iodoflex last week still under compression 9/30; edema control is good. Wound is smaller very healthy surface. I changed back to silver collagen today 10/6; edema control is adequate with 4-layer I am using silver collagen the wound measures smaller. Still some eschar and flaking skin around the margins 10/13; edema control is adequate. He had eschar over the wound area. Although they thought this might be closed when we remove the eschar there is still an open area. Therefore we will redress this and rewrap 10/20; edema control is good. He still does not have his stockings again the area is heavily covered in eschar. I gently remove this with a #5 curette I  did not see anything precisely open here. However small draining area could easily do this. We are going to change him to polymen still under the same compression while he orders stockings 10/27 he has a stockings today however dry flaky skin covering over a wound area that is not closed. This is disappointing. Using polymen under compression he has good edema control. This is worse than last week when I thought we did not have anything open Electronic Signature(s) Signed: 11/11/2020 4:29:54 PM By: [name] Entered By: Linton Ham on 11/06/2020 15:50:07 -------------------------------------------------------------------------------- Physical Exam Details Patient Name: Date of Service: Wayne Pratt, [name] 11/06/2020 2:30 PM Medical Record Number: [id number] Patient Account Number: [id number] Date of Birth/Sex: Treating RN: 1960/10/30 (60 y.o. Wayne Pratt Primary Care Provider: [name] Other Clinician: Referring Provider: Treating Provider/Extender: Fredderick Severance Weeks in Treatment: 17 Constitutional Sitting or standing Blood Pressure is within target range for patient.. Pulse regular and within target range for patient.[name] Kitchen Respirations regular, non-labored and within target range.. Temperature is normal and within the target range for the patient.[name] Kitchen Appears in no distress. Notes Wound exam; right posterior lateral calf. Dry skin and callus this was obviously not closed today. Very disappointing. I see no evidence of infection and his edema control is good Electronic Signature(s) Signed: 11/11/2020 4:29:54 PM By: [name] Entered By: Linton Ham on 11/06/2020 15:50:53 -------------------------------------------------------------------------------- Physician Orders Details Patient Name: Date of Service: Wayne Pratt, [name] 11/06/2020 2:30 PM Medical Record Number: [id number] Patient Account Number: [id number] Date of Birth/Sex: Treating  RN: 1960-12-10 (60 y.o. Wayne Pratt Primary Care Provider: [name] Other Clinician: Referring Provider: Treating Provider/Extender: Martie Lee,  Alethea Weeks in Treatment: 17 Verbal / Phone Orders: No Diagnosis Coding ICD-10 Coding Code Description E11.51 Type 2 diabetes mellitus with diabetic peripheral angiopathy without gangrene L97.218 Non-pressure chronic ulcer of right calf with other specified severity Follow-up Appointments ppointment in 1 week. - Dr. Dellia Nims Return A Bathing/ Shower/ Hygiene May shower with protection but do not get wound dressing(s) wet. Edema Control - Lymphedema / SCD / Other Elevate legs to the level of the heart or above for 30 minutes daily and/or when sitting, a frequency of: - 3-4 times per day Avoid standing for long periods of time. Exercise regularly Wound Treatment Wound #1 - Lower Leg Wound Laterality: Right, Posterior Cleanser: Soap and Water 1 x Per Week Discharge Instructions: May shower and wash wound with dial antibacterial soap and water prior to dressing change. Cleanser: Wound Cleanser 1 x Per Week Discharge Instructions: Cleanse the wound with wound cleanser prior to applying a clean dressing using gauze sponges, not tissue or cotton balls. Peri-Wound Care: Sween Lotion (Moisturizing lotion) 1 x Per Week Discharge Instructions: Apply moisturizing lotion as directed Prim Dressing: PolyMem Silver Non-Adhesive Dressing, 4.25x4.25 in 1 x Per Week ary Discharge Instructions: Apply to wound bed as instructed Secondary Dressing: ABD Pad, 5x9 1 x Per Week Discharge Instructions: Apply over primary dressing as directed. Compression Wrap: ThreePress (3 layer compression wrap) 1 x Per Week Discharge Instructions: Apply three layer compression as directed. Electronic Signature(s) Signed: 11/06/2020 5:01:05 PM By: Dellie Catholic RN Signed: 11/11/2020 4:29:54 PM By: [name] Entered By: Dellie Catholic on  11/06/2020 15:36:39 -------------------------------------------------------------------------------- Problem List Details Patient Name: Date of Service: Wayne Pratt, [name] 11/06/2020 2:30 PM Medical Record Number: [id number] Patient Account Number: [id number] Date of Birth/Sex: Treating RN: 05/01/1960 (60 y.o. 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 11/06/2020 2:30 PM Medical Record Number: [id number] Patient Account Number: [id number] Date of Birth/Sex: Treating RN: 01-Oct-1960 (60 y.o. Wayne Pratt Primary Care Provider: [name] Other Clinician: Referring Provider: Treating Provider/Extender: Fredderick Severance Weeks in Treatment: 17 Subjective History of Present Illness (HPI) ADMISSION 07/04/2020 This is a 60 year old woman who presented to Dr. Carlis Abbott in January of this year having critical limb ischemia. First angiogram was on 01/31/2020 showed proximal profound disease in the SFA with right foot runoff via diseased posterior tibia. He underwent a right femoral endarterectomy with a right common femoral to below-knee popliteal bypass on 02/04/2020. There were generally good vascular results however the day after the surgery according to his  wife he developed a black eschar at presumably ischemic spot on the right posterior calf. He also ultimately had amputations on 02/29/2020 of the right first second and fifth toes in March she had a wound VAC on the right calf but this was too painful and was switched to wet-to-dry. At 1 point this clearly had exposed muscle and the wife was able to document this by showing me short movie on his phone. He had an operative debridement on  the right calf in early April with a skin substitute oMyriad. In any case none of this really made any difference. He was hospitalized early in June with bilateral pulmonary emboli and is now on Eliquis. Long medical problem list but predominantly type 2 diabetes with peripheral neuropathy, spinal stenosis. He is a smoker Most recent arterial studies were done on 03/18/2020 on the right this showed a an ABI in the posterior tibial of 1.05 with biphasic waveforms on the left noncompressible at 1.38 but with a great toe pressure near normal at 1.11 again with triphasic waveforms. 6/29; patient arrives back in clinic. We use Santyl on the posterior calf wound on the right. Arrives in clinic with this looking a lot better. Healthy red granulated tissue over the majority of this. 7/7 the wound is cleaned up quite nicely I think it is time to change from Santyl to [facility] They are using a local wrap here. There is edema in his upper lower leg. 7/21; using Hydrofera Blue. Nice improvement in surface area they are changing this every 2d 8/18; using Hydrofera Blue and bordered foam. Nice improvement in surface area 9/1; patient has some degree of lymphedema in the right leg we have not been wrapping him using Hydrofera Blue and bordered foam making decent progress in surface area. T oday he arrives with some erythema below the wound scaly skin this there is some form of contact dermatitis perhaps stasis dermatitis. I am not thinking that this is actually bacterial  cellulitis as it is neither warm nor tender. The wound itself actually continues to look quite good 9/6; the patient's lymphedema in the right leg is considerably improved with compression. Also result of this the wound area is much better. We have been using Hydrofera Blue now under compression 9/14; still good edema control. Wound is smaller we have been using Hydrofera Blue for a prolonged period of time 9/20; still good edema control. Wound is slightly smaller but with a much healthier surface I changed to Iodoflex last week still under compression 9/30; edema control is good. Wound is smaller very healthy surface. I changed back to silver collagen today 10/6; edema control is adequate with 4-layer I am using silver collagen the wound measures smaller. Still some eschar and flaking skin around the margins 10/13; edema control is adequate. He had eschar over the wound area. Although they thought this might be closed when we remove the eschar there is still an open area. Therefore we will redress this and rewrap 10/20; edema control is good. He still does not have his stockings again the area is heavily covered in eschar. I gently remove this with a #5 curette I did not see anything precisely open here. However small draining area could easily do this. We are going to change him to polymen still under the same compression while he orders stockings 10/27 he has a stockings today however dry flaky skin covering over a wound area that is not closed. This is disappointing. Using polymen under compression he has good edema control. This is worse than last week when I thought we did not have anything open Objective Constitutional Sitting or standing Blood Pressure is within target range for patient.. Pulse regular and within target range for patient.[name] Kitchen Respirations regular, non-labored and within target range.. Temperature is normal and within the target range for the patient.[name] Kitchen Appears in no  distress. Vitals Time Taken: 3:00 PM, Height: 73 in, Weight: 215 lbs, BMI: 28.4, Temperature: 98.4 F, Pulse: 83  bpm, Respiratory Rate: 18 breaths/min, Blood Pressure: 145/79 mmHg. General Notes: Wound exam; right posterior lateral calf. Dry skin and callus this was obviously not closed today. Very disappointing. I see no evidence of infection and his edema control is good Integumentary (Hair, Skin) Wound #1 status is Open. Original cause of wound was Surgical Injury. The date acquired was: 02/04/2020. The wound has been in treatment 17 weeks. The wound is located on the Right,Posterior Lower Leg. The wound measures 1cm length x 1cm width x 0.1cm depth; 0.785cm^2 area and 0.079cm^3 volume. There is Fat Layer (Subcutaneous Tissue) exposed. There is no tunneling or undermining noted. There is a small amount of serosanguineous drainage noted. The wound margin is distinct with the outline attached to the wound base. There is large (67-100%) pink granulation within the wound bed. There is no necrotic tissue within the wound bed. Assessment Active Problems ICD-10 Type 2 diabetes mellitus with diabetic peripheral angiopathy without gangrene Non-pressure chronic ulcer of right calf with other specified severity Procedures Wound #1 Pre-procedure diagnosis of Wound #1 is a Diabetic Wound/Ulcer of the Lower Extremity located on the Right,Posterior Lower Leg .Severity of Tissue Pre Debridement is: Fat layer exposed. There was a Selective/Open Wound Skin/Epidermis Debridement with a total area of 1 sq cm performed by Ricard Dillon., MD. With the following instrument(s): Curette to remove Non-Viable tissue/material. Material removed includes Skin: Dermis and Skin: Epidermis and. No specimens were taken. A time out was conducted at 15:23, prior to the start of the procedure. A Minimum amount of bleeding was controlled with Pressure. The procedure was tolerated well with a pain level of 0 throughout and a  pain level of 0 following the procedure. Post Debridement Measurements: 1cm length x 1cm width x 0.1cm depth; 0.079cm^3 volume. Character of Wound/Ulcer Post Debridement is improved. Severity of Tissue Post Debridement is: Fat layer exposed. Post procedure Diagnosis Wound #1: Same as Pre-Procedure Pre-procedure diagnosis of Wound #1 is a Diabetic Wound/Ulcer of the Lower Extremity located on the Right,Posterior Lower Leg . There was a Three Layer Compression Therapy Procedure by Deon Pilling, RN. Post procedure Diagnosis Wound #1: Same as Pre-Procedure Plan Follow-up Appointments: Return Appointment in 1 week. - Dr. Dellia Nims Bathing/ Shower/ Hygiene: May shower with protection but do not get wound dressing(s) wet. Edema Control - Lymphedema / SCD / Other: Elevate legs to the level of the heart or above for 30 minutes daily and/or when sitting, a frequency of: - 3-4 times per day Avoid standing for long periods of time. Exercise regularly WOUND #1: - Lower Leg Wound Laterality: Right, Posterior Cleanser: Soap and Water 1 x Per Week/ Discharge Instructions: May shower and wash wound with dial antibacterial soap and water prior to dressing change. Cleanser: Wound Cleanser 1 x Per Week/ Discharge Instructions: Cleanse the wound with wound cleanser prior to applying a clean dressing using gauze sponges, not tissue or cotton balls. Peri-Wound Care: Sween Lotion (Moisturizing lotion) 1 x Per Week/ Discharge Instructions: Apply moisturizing lotion as directed Prim Dressing: PolyMem Silver Non-Adhesive Dressing, 4.25x4.25 in 1 x Per Week/ ary Discharge Instructions: Apply to wound bed as instructed Secondary Dressing: ABD Pad, 5x9 1 x Per Week/ Discharge Instructions: Apply over primary dressing as directed. Com pression Wrap: ThreePress (3 layer compression wrap) 1 x Per Week/ Discharge Instructions: Apply three layer compression as directed. I continued the polymen under 4-layer  compression. 2. Very small area that will not close over 3. If this is not closed by next week  change the dressing. I do not see an obvious cause Electronic Signature(s) Signed: 11/11/2020 4:29:54 PM By: [name] Entered By: Linton Ham on 11/06/2020 15:54:21 -------------------------------------------------------------------------------- SuperBill Details Patient Name: Date of Service: Wayne Pratt, [name] 11/06/2020 Medical Record Number: [id number] Patient Account Number: [id number] Date of Birth/Sex: Treating RN: 1960/04/04 (60 y.o. Wayne Pratt Primary Care Provider: [name] Other Clinician: Referring Provider: Treating Provider/Extender: Fredderick Severance Weeks in Treatment: 17 Diagnosis Coding ICD-10 Codes Code Description E11.51 Type 2 diabetes mellitus with diabetic peripheral angiopathy without gangrene L97.218 Non-pressure chronic ulcer of right calf with other specified severity Facility Procedures CPT4 Code: [id number] Description: 61224 - DEBRIDE WOUND 1ST 20 SQ CM OR < ICD-10 Diagnosis Description L97.218 Non-pressure chronic ulcer of right calf with other specified severity Modifier: Quantity: 1 Physician Procedures : CPT4 Code Description Modifier [id number] - WC PHYS DEBR WO ANESTH 20 SQ CM ICD-10 Diagnosis Description T11.735 Non-pressure chronic ulcer of right calf with other specified severity Quantity: 1 Electronic Signature(s) Signed: 11/11/2020 4:29:54 PM By: [name] Entered By: Linton Ham on 11/06/2020 15:54:33

## 2020-11-12 ENCOUNTER — Encounter (HOSPITAL_BASED_OUTPATIENT_CLINIC_OR_DEPARTMENT_OTHER): Payer: PPO | Attending: Internal Medicine | Admitting: Internal Medicine

## 2020-11-12 ENCOUNTER — Other Ambulatory Visit: Payer: Self-pay

## 2020-11-12 DIAGNOSIS — T8189XA Other complications of procedures, not elsewhere classified, initial encounter: Secondary | ICD-10-CM | POA: Diagnosis not present

## 2020-11-12 DIAGNOSIS — E11622 Type 2 diabetes mellitus with other skin ulcer: Secondary | ICD-10-CM | POA: Diagnosis not present

## 2020-11-12 DIAGNOSIS — E1151 Type 2 diabetes mellitus with diabetic peripheral angiopathy without gangrene: Secondary | ICD-10-CM | POA: Diagnosis not present

## 2020-11-12 DIAGNOSIS — M069 Rheumatoid arthritis, unspecified: Secondary | ICD-10-CM | POA: Insufficient documentation

## 2020-11-12 DIAGNOSIS — L97218 Non-pressure chronic ulcer of right calf with other specified severity: Secondary | ICD-10-CM | POA: Insufficient documentation

## 2020-11-12 DIAGNOSIS — L97212 Non-pressure chronic ulcer of right calf with fat layer exposed: Secondary | ICD-10-CM | POA: Diagnosis not present

## 2020-11-13 ENCOUNTER — Encounter (HOSPITAL_BASED_OUTPATIENT_CLINIC_OR_DEPARTMENT_OTHER): Payer: PPO | Admitting: Internal Medicine

## 2020-11-13 NOTE — Progress Notes (Signed)
Wayne, Pratt ([id number]) Visit Report for 11/12/2020 Arrival Information Details Patient Name: Date of Service: Wayne Pratt. 11/12/2020 11:15 A M Medical Record Number: [id number] Patient Account Number: [id number] Date of Birth/Sex: Treating RN: August 20, 1960 (60 y.o. [name] Primary Care Tersa Fotopoulos: [name] Other Clinician: Referring Rebeccah Ivins: Treating Francena Zender/Extender: Christel Mormon in Treatment: 18 Visit Information History Since Last Visit Added or deleted any medications: No Patient Arrived: Ambulatory Any new allergies or adverse reactions: No Arrival Time: 11:37 Had a fall or experienced change in No Accompanied By: spouse activities of daily living that may affect Transfer Assistance: None risk of falls: Patient Identification Verified: Yes Signs or symptoms of abuse/neglect since last visito No Secondary Verification Process Completed: Yes Hospitalized since last visit: No Patient Requires Transmission-Based Precautions: No Implantable device outside of the clinic excluding No Patient Has Alerts: Yes cellular tissue based products placed in the center Patient Alerts: Patient on Blood Thinner since last visit: ABI's: R:1.05 03/22 Has Dressing in Place as Prescribed: No Has Compression in Place as Prescribed: No Pain Present Now: No Electronic Signature(s) Signed: 11/13/2020 5:51:35 PM By: Levan Hurst RN, BSN Entered By: Levan Hurst on 11/12/2020 11:38:16 -------------------------------------------------------------------------------- Compression Therapy Details Patient Name: Date of Service: Wayne Pratt, [name] 11/12/2020 11:15 A M Medical Record Number: [id number] Patient Account Number: [id number] Date of Birth/Sex: Treating RN: [birth year] (60 y.o. [name] Primary Care [name]: [name] Other Clinician: Referring Derl Abalos: Treating Ranbir Chew/Extender: Fredderick Severance Weeks in Treatment:  18 Compression Therapy Performed for Wound Assessment: Wound #1 Right,Posterior Lower Leg Performed By: Clinician Levan Hurst, RN Compression Type: Three Layer Post Procedure Diagnosis Same as Pre-procedure Electronic Signature(s) Signed: 11/13/2020 5:51:35 PM By: Levan Hurst RN, BSN Entered By: Levan Hurst on 11/12/2020 11:54:25 -------------------------------------------------------------------------------- Encounter Discharge Information Details Patient Name: Date of Service: Wayne Pratt, [name] 11/12/2020 11:15 A M Medical Record Number: [id number] Patient Account Number: [id number] Date of Birth/Sex: Treating RN: 06-14-1960 (60 y.o. [name] Primary Care [name]: [name] Other Clinician: Referring Zerick Prevette: Treating [name]/Extender: Christel Mormon in Treatment: 18 Encounter Discharge Information Items Discharge Condition: Stable Ambulatory Status: Ambulatory Discharge Destination: Home Transportation: Private Auto Accompanied By: spouse Schedule Follow-up Appointment: Yes Clinical Summary of Care: Patient Declined Electronic Signature(s) Signed: 11/13/2020 5:51:35 PM By: Levan Hurst RN, BSN Entered By: Levan Hurst on 11/12/2020 13:47:27 -------------------------------------------------------------------------------- Lower Extremity Assessment Details Patient Name: Date of Service: Wayne GG, [name] 11/12/2020 11:15 A M Medical Record Number: [id number] Patient Account Number: [id number] Date of Birth/Sex: Treating RN: 02/06/1960 (60 y.o. [name] Primary Care Endre Coutts: [name] Other Clinician: Referring Brayleigh Rybacki: Treating Davene Jobin/Extender: Fredderick Severance Weeks in Treatment: 18 Edema Assessment Assessed: [Left: No] [Right: No] Edema: [Left: N] [Right: o] Calf Left: Right: Point of Measurement: 34 cm From Medial Instep 38.9 cm Ankle Left: Right: Point of Measurement: 9 cm From  Medial Instep 22.9 cm Vascular Assessment Pulses: Dorsalis Pedis Palpable: [Right:Yes] Electronic Signature(s) Signed: 11/13/2020 5:51:35 PM By: Levan Hurst RN, BSN Entered By: Levan Hurst on 11/12/2020 11:41:15 -------------------------------------------------------------------------------- Multi Wound Chart Details Patient Name: Date of Service: Wayne Pratt, [name] 11/12/2020 11:15 A M Medical Record Number: [id number] Patient Account Number: [id number] Date of Birth/Sex: Treating RN: [birth year] (60 y.o. [name] Primary Care [name]: [name] Other Clinician: Referring Goldie Dimmer: Treating [name]/Extender: Fredderick Severance Weeks in Treatment: 18 Vital Signs Height(in): 73 Pulse(bpm): 112 Weight(lbs): 215  Blood Pressure(mmHg): 144/76 Body Mass Index(BMI): 28 Temperature(F): 98.5 Respiratory Rate(breaths/min): 18 Photos: [N/A:N/A] Right, Posterior Lower Leg N/A N/A Wound Location: Surgical Injury N/A N/A Wounding Event: Diabetic Wound/Ulcer of the Lower N/A N/A Primary Etiology: Extremity Anemia, Asthma, Sleep Apnea, N/A N/A Comorbid History: Hypertension, Peripheral Arterial Disease, Type II Diabetes, Rheumatoid Arthritis, Neuropathy 02/04/2020 N/A N/A Date Acquired: 28 N/A N/A Weeks of Treatment: Open N/A N/A Wound Status: 0.1x0.1x0.1 N/A N/A Measurements L x W x D (cm) 0.008 N/A N/A A (cm) : rea 0.001 N/A N/A Volume (cm) : 100.00% N/A N/A % Reduction in A rea: 100.00% N/A N/A % Reduction in Volume: Grade 2 N/A N/A Classification: Small N/A N/A Exudate A mount: Serosanguineous N/A N/A Exudate Type: red, brown N/A N/A Exudate Color: Distinct, outline attached N/A N/A Wound Margin: Large (67-100%) N/A N/A Granulation A mount: Pink N/A N/A Granulation Quality: None Present (0%) N/A N/A Necrotic A mount: Fat Layer (Subcutaneous Tissue): Yes N/A N/A Exposed Structures: Fascia: No Tendon: No Muscle:  No Joint: No Bone: No Large (67-100%) N/A N/A Epithelialization: Treatment Notes Electronic Signature(s) Signed: 11/13/2020 5:02:06 PM By: [name] Signed: 11/13/2020 5:51:35 PM By: Levan Hurst RN, BSN Entered By: Linton Ham on 11/12/2020 11:48:50 -------------------------------------------------------------------------------- Multi-Disciplinary Care Plan Details Patient Name: Date of Service: Wayne Pratt, [name] 11/12/2020 11:15 A M Medical Record Number: [id number] Patient Account Number: [id number] Date of Birth/Sex: Treating RN: August 01, 1960 (60 y.o. [name] Primary Care Aaronmichael Brumbaugh: [name] Other Clinician: Referring Clement Deneault: Treating Treyshawn Muldrew/Extender: Christel Mormon in Treatment: [place] reviewed with physician Active Inactive Nutrition Nursing Diagnoses: Impaired glucose control: actual or potential Potential for alteratiion in Nutrition/Potential for imbalanced nutrition Goals: Patient/caregiver will maintain therapeutic glucose control Date Initiated: 07/04/2020 Target Resolution Date: 12/05/2020 Goal Status: Active Interventions: Assess HgA1c results as ordered upon admission and as needed Provide education on elevated blood sugars and impact on wound healing Provide education on nutrition Treatment Activities: Education provided on Nutrition : 10/23/2020 Patient referred to Primary Care Physician for further nutritional evaluation : 07/04/2020 Notes: Wound/Skin Impairment Nursing Diagnoses: Impaired tissue integrity Knowledge deficit related to ulceration/compromised skin integrity Goals: Patient/caregiver will verbalize understanding of skin care regimen Date Initiated: 07/04/2020 Target Resolution Date: 12/05/2020 Goal Status: Active Ulcer/skin breakdown will have a volume reduction of 30% by week 4 Date Initiated: 07/04/2020 Date Inactivated: 08/28/2020 Target Resolution Date:  08/08/2020 Goal Status: Met Interventions: Assess patient/caregiver ability to obtain necessary supplies Assess patient/caregiver ability to perform ulcer/skin care regimen upon admission and as needed Assess ulceration(s) every visit Provide education on ulcer and skin care Treatment Activities: Skin care regimen initiated : 07/04/2020 Topical wound management initiated : 07/04/2020 Notes: Electronic Signature(s) Signed: 11/13/2020 5:51:35 PM By: Levan Hurst RN, BSN Entered By: Levan Hurst on 11/12/2020 13:46:12 -------------------------------------------------------------------------------- Pain Assessment Details Patient Name: Date of Service: Wayne Pratt, [name] 11/12/2020 11:15 A M Medical Record Number: [id number] Patient Account Number: [id number] Date of Birth/Sex: Treating RN: [birth year] (60 y.o. [name] Primary Care Joyel Chenette: [name] Other Clinician: Referring Jeyla Bulger: Treating Tome Wilson/Extender: Fredderick Severance Weeks in Treatment: 18 Active Problems Location of Pain Severity and Description of Pain Patient Has Paino No Site Locations Pain Management and Medication Current Pain Management: Electronic Signature(s) Signed: 11/13/2020 5:51:35 PM By: Levan Hurst RN, BSN Entered By: Levan Hurst on 11/12/2020 11:38:47 -------------------------------------------------------------------------------- Patient/Caregiver Education Details Patient Name: Date of Service: Wayne Pratt, [place] W. 11/2/2022andnbsp11:15 A M Medical Record Number: [id number]  Patient Account Number: [id number] Date of Birth/Gender: Treating RN: 05-21-1960 (60 y.o. [name] Primary Care Physician: [name] Other Clinician: Referring Physician: Treating Physician/Extender: Christel Mormon in Treatment: 18 Education Assessment Education Provided To: Patient Education Topics Provided Wound/Skin Impairment: Methods:  Explain/Verbal Responses: State content correctly [employer]) Signed: 11/13/2020 5:51:35 PM By: Levan Hurst RN, BSN Entered By: Levan Hurst on 11/12/2020 13:46:30 -------------------------------------------------------------------------------- Wound Assessment Details Patient Name: Date of Service: Wayne Pratt, [name] 11/12/2020 11:15 A M Medical Record Number: [id number] Patient Account Number: [id number] Date of Birth/Sex: Treating RN: 1960-03-06 (60 y.o. [name] Primary Care Derrion Tritz: [name] Other Clinician: Referring Lateisha Thurlow: Treating Lequita Meadowcroft/Extender: Fredderick Severance Weeks in Treatment: 18 Wound Status Wound Number: 1 Primary Diabetic Wound/Ulcer of the Lower Extremity Etiology: Wound Location: Right, Posterior Lower Leg Wound Open Wounding Event: Surgical Injury Status: Date Acquired: 02/04/2020 Comorbid Anemia, Asthma, Sleep Apnea, Hypertension, Peripheral Arterial Weeks Of Treatment: 18 History: Disease, Type II Diabetes, Rheumatoid Arthritis, Neuropathy Clustered Wound: No Photos Wound Measurements Length: (cm) 0.1 Width: (cm) 0.1 Depth: (cm) 0.1 Area: (cm) 0.008 Volume: (cm) 0.001 % Reduction in Area: 100% % Reduction in Volume: 100% Epithelialization: Large (67-100%) Tunneling: No Undermining: No Wound Description Classification: Grade 2 Wound Margin: Distinct, outline attached Exudate Amount: Small Exudate Type: Serosanguineous Exudate Color: red, brown Foul Odor After Cleansing: No Slough/Fibrino No Wound Bed Granulation Amount: Large (67-100%) Exposed Structure Granulation Quality: Pink Fascia Exposed: No Necrotic Amount: None Present (0%) Fat Layer (Subcutaneous Tissue) Exposed: Yes Tendon Exposed: No Muscle Exposed: No Joint Exposed: No Bone Exposed: No Treatment Notes Wound #1 (Lower Leg) Wound Laterality: Right, Posterior Cleanser Soap and Water Discharge Instruction: May shower and  wash wound with dial antibacterial soap and water prior to dressing change. Wound Cleanser Discharge Instruction: Cleanse the wound with wound cleanser prior to applying a clean dressing using gauze sponges, not tissue or cotton balls. Peri-Wound Care Sween Lotion (Moisturizing lotion) Discharge Instruction: Apply moisturizing lotion as directed Topical Primary Dressing PolyMem Silver Non-Adhesive Dressing, 4.25x4.25 in Discharge Instruction: Apply to wound bed as instructed Secondary Dressing Woven Gauze Sponge, Non-Sterile 4x4 in Discharge Instruction: Apply over primary dressing as directed. Secured With Compression Wrap ThreePress (3 layer compression wrap) Discharge Instruction: Apply three layer compression as directed. Compression Stockings Add-Ons Electronic Signature(s) Signed: 11/12/2020 5:02:47 PM By: Dellie Catholic RN Signed: 11/13/2020 5:51:35 PM By: Levan Hurst RN, BSN Entered By: Dellie Catholic on 11/12/2020 11:42:56 -------------------------------------------------------------------------------- Vitals Details Patient Name: Date of Service: Wayne Pratt, [name] 11/12/2020 11:15 A M Medical Record Number: [id number] Patient Account Number: [id number] Date of Birth/Sex: Treating RN: [birth year] (60 y.o. [name] Primary Care Ceniya Fowers: [name] Other Clinician: Referring Fareed Fung: Treating Lakoda Raske/Extender: Fredderick Severance Weeks in Treatment: 18 Vital Signs Time Taken: 11:38 Temperature (F): 98.5 Height (in): 73 Pulse (bpm): 112 Weight (lbs): 215 Respiratory Rate (breaths/min): 18 Body Mass Index (BMI): 28.4 Blood Pressure (mmHg): 144/76 Reference Range: 80 - 120 mg / dl Electronic Signature(s) Signed: 11/13/2020 5:51:35 PM By: Levan Hurst RN, BSN Entered By: Levan Hurst on 11/12/2020 11:38:39

## 2020-11-13 NOTE — Progress Notes (Signed)
KANAAN, KAGAWA ([id number]) Visit Report for 11/12/2020 HPI Details Patient Name: Date of Service: Wayne Pratt. 11/12/2020 11:15 A M Medical Record Number: [id number] Patient Account Number: [id number] Date of Birth/Sex: Treating RN: 29-Mar-1960 (60 y.o. [name] Primary Care Provider: [name] Other Clinician: Referring Provider: Treating Provider/Extender: Fredderick Severance Weeks in Treatment: 18 History of Present Illness HPI Description: ADMISSION 07/04/2020 This is a 60 year old woman who presented to Dr. Carlis Abbott in January of this year having critical limb ischemia. First angiogram was on 01/31/2020 showed proximal profound disease in the SFA with right foot runoff via diseased posterior tibia. He underwent a right femoral endarterectomy with a right common femoral to below-knee popliteal bypass on 02/04/2020. There were generally good vascular results however the day after the surgery according to his wife he developed a black eschar at presumably ischemic spot on the right posterior calf. He also ultimately had amputations on 02/29/2020 of the right first second and fifth toes in March she had a wound VAC on the right calf but this was too painful and was switched to wet-to-dry. At 1 point this clearly had exposed muscle and the wife was able to document this by showing me short movie on his phone. He had an operative debridement on the right calf in early April with a skin substitute oMyriad. In any case none of this really made any difference. He was hospitalized early in June with bilateral pulmonary emboli and is now on Eliquis. Long medical problem list but predominantly type 2 diabetes with peripheral neuropathy, spinal stenosis. He is a smoker Most recent arterial studies were done on 03/18/2020 on the right this showed a an ABI in the posterior tibial of 1.05 with biphasic waveforms on the left noncompressible at 1.38 but with a great toe pressure near  normal at 1.11 again with triphasic waveforms. 6/29; patient arrives back in clinic. We use Santyl on the posterior calf wound on the right. Arrives in clinic with this looking a lot better. Healthy red granulated tissue over the majority of this. 7/7 the wound is cleaned up quite nicely I think it is time to change from Santyl to [facility]. They are using a local wrap here. There is edema in his upper lower leg. 7/21; using Hydrofera Blue. Nice improvement in surface area they are changing this every 2d 8/18; using Hydrofera Blue and bordered foam. Nice improvement in surface area 9/1; patient has some degree of lymphedema in the right leg we have not been wrapping him using Hydrofera Blue and bordered foam making decent progress in surface area. T oday he arrives with some erythema below the wound scaly skin this there is some form of contact dermatitis perhaps stasis dermatitis. I am not thinking that this is actually bacterial cellulitis as it is neither warm nor tender. The wound itself actually continues to look quite good 9/6; the patient's lymphedema in the right leg is considerably improved with compression. Also result of this the wound area is much better. We have been using Hydrofera Blue now under compression 9/14; still good edema control. Wound is smaller we have been using Hydrofera Blue for a prolonged period of time 9/20; still good edema control. Wound is slightly smaller but with a much healthier surface I changed to Iodoflex last week still under compression 9/30; edema control is good. Wound is smaller very healthy surface. I changed back to silver collagen today 10/6; edema control is adequate with 4-layer I am  using silver collagen the wound measures smaller. Still some eschar and flaking skin around the margins 10/13; edema control is adequate. He had eschar over the wound area. Although they thought this might be closed when we remove the eschar there is still an open  area. Therefore we will redress this and rewrap 10/20; edema control is good. He still does not have his stockings again the area is heavily covered in eschar. I gently remove this with a #5 curette I did not see anything precisely open here. However small draining area could easily do this. We are going to change him to polymen still under the same compression while he orders stockings 10/27 he has a stockings today however dry flaky skin covering over a wound area that is not closed. This is disappointing. Using polymen under compression he has good edema control. This is worse than last week when I thought we did not have anything open 11/2; came in early because of pain in his feet caused by the wrap. For some reason they only called yesterday and he arrives today. Fortunately nothing really major. He got relief when we took the wrap off. The wound has some surface eschar but I cannot identify any open wound Electronic Signature(s) Signed: 11/13/2020 5:02:06 PM By: [name] Entered By: Linton Ham on 11/12/2020 11:49:40 -------------------------------------------------------------------------------- Physical Exam Details Patient Name: Date of Service: Wayne Pratt, Wayne RY W. 11/12/2020 11:15 A M Medical Record Number: [id number] Patient Account Number: [id number] Date of Birth/Sex: Treating RN: September 17, 1960 (60 y.o. [name] Primary Care Provider: [name] Other Clinician: Referring Provider: Treating Provider/Extender: Fredderick Severance Weeks in Treatment: 18 Constitutional Sitting or standing Blood Pressure is within target range for patient.. Pulse regular and within target range for patient.[name] Kitchen Respirations regular, non-labored and within target range.. Temperature is normal and within the target range for the patient.[name] Kitchen Appears in no distress. Notes Wound exam; right posterior lateral calf. Scant amount of dry flaking skin over the surface I did not  remove this. There is no evidence of surrounding infection everything looks fine here. Electronic Signature(s) Signed: 11/13/2020 5:02:06 PM By: [name] Entered By: Linton Ham on 11/12/2020 11:51:13 -------------------------------------------------------------------------------- Physician Orders Details Patient Name: Date of Service: Wayne Pratt, Wayne RY W. 11/12/2020 11:15 A M Medical Record Number: [id number] Patient Account Number: [id number] Date of Birth/Sex: Treating RN: 1960/08/29 (60 y.o. [name] Primary Care Provider: [name] Other Clinician: Referring Provider: Treating Provider/Extender: Christel Mormon in Treatment: 32 Verbal / Phone Orders: No Diagnosis Coding ICD-10 Coding Code Description E11.51 Type 2 diabetes mellitus with diabetic peripheral angiopathy without gangrene L97.218 Non-pressure chronic ulcer of right calf with other specified severity Follow-up Appointments ppointment in 1 week. - Dr. Dellia Nims Return A Bathing/ Shower/ Hygiene May shower with protection but do not get wound dressing(s) wet. Edema Control - Lymphedema / SCD / Other Elevate legs to the level of the heart or above for 30 minutes daily and/or when sitting, a frequency of: - 3-4 times per day Avoid standing for long periods of time. Exercise regularly Wound Treatment Wound #1 - Lower Leg Wound Laterality: Right, Posterior Cleanser: Soap and Water 1 x Per Week Discharge Instructions: May shower and wash wound with dial antibacterial soap and water prior to dressing change. Cleanser: Wound Cleanser 1 x Per Week Discharge Instructions: Cleanse the wound with wound cleanser prior to applying a clean dressing using gauze sponges, not tissue or cotton balls. Peri-Wound Care:  Sween Lotion (Moisturizing lotion) 1 x Per Week Discharge Instructions: Apply moisturizing lotion as directed Prim Dressing: PolyMem Silver Non-Adhesive Dressing, 4.25x4.25 in 1  x Per Week ary Discharge Instructions: Apply to wound bed as instructed Secondary Dressing: Woven Gauze Sponge, Non-Sterile 4x4 in 1 x Per Week Discharge Instructions: Apply over primary dressing as directed. Compression Wrap: ThreePress (3 layer compression wrap) 1 x Per Week Discharge Instructions: Apply three layer compression as directed. Electronic Signature(s) Signed: 11/13/2020 5:02:06 PM By: [name] Signed: 11/13/2020 5:51:35 PM By: Levan Hurst RN, BSN Entered By: Levan Hurst on 11/12/2020 11:53:44 -------------------------------------------------------------------------------- Problem List Details Patient Name: Date of Service: Wayne Pratt, Wayne RY W. 11/12/2020 11:15 A M Medical Record Number: [id number] Patient Account Number: [id number] Date of Birth/Sex: Treating RN: 1960/10/17 (61 y.o. [name] Primary Care Provider: [name] Other Clinician: Referring Provider: Treating Provider/Extender: Fredderick Severance Weeks in Treatment: 18 Active Problems ICD-10 Encounter Code Description Active Date MDM Diagnosis E11.51 Type 2 diabetes mellitus with diabetic peripheral angiopathy without gangrene 07/04/2020 No Yes L97.218 Non-pressure chronic ulcer of right calf with other specified severity 07/04/2020 No Yes Inactive Problems Resolved Problems Electronic Signature(s) Signed: 11/13/2020 5:02:06 PM By: [name] Entered By: Linton Ham on 11/12/2020 11:48:39 -------------------------------------------------------------------------------- Progress Note Details Patient Name: Date of Service: Wayne Pratt, Wayne RY W. 11/12/2020 11:15 A M Medical Record Number: [id number] Patient Account Number: [id number] Date of Birth/Sex: Treating RN: November 02, 1960 (60 y.o. [name] Primary Care Provider: [name] Other Clinician: Referring Provider: Treating Provider/Extender: Fredderick Severance Weeks in Treatment:  18 Subjective History of Present Illness (HPI) ADMISSION 07/04/2020 This is a 60 year old woman who presented to Dr. Carlis Abbott in January of this year having critical limb ischemia. First angiogram was on 01/31/2020 showed proximal profound disease in the SFA with right foot runoff via diseased posterior tibia. He underwent a right femoral endarterectomy with a right common femoral to below-knee popliteal bypass on 02/04/2020. There were generally good vascular results however the day after the surgery according to his wife he developed a black eschar at presumably ischemic spot on the right posterior calf. He also ultimately had amputations on 02/29/2020 of the right first second and fifth toes in March she had a wound VAC on the right calf but this was too painful and was switched to wet-to-dry. At 1 point this clearly had exposed muscle and the wife was able to document this by showing me short movie on his phone. He had an operative debridement on the right calf in early April with a skin substitute oMyriad. In any case none of this really made any difference. He was hospitalized early in June with bilateral pulmonary emboli and is now on Eliquis. Long medical problem list but predominantly type 2 diabetes with peripheral neuropathy, spinal stenosis. He is a smoker Most recent arterial studies were done on 03/18/2020 on the right this showed a an ABI in the posterior tibial of 1.05 with biphasic waveforms on the left noncompressible at 1.38 but with a great toe pressure near normal at 1.11 again with triphasic waveforms. 6/29; patient arrives back in clinic. We use Santyl on the posterior calf wound on the right. Arrives in clinic with this looking a lot better. Healthy red granulated tissue over the majority of this. 7/7 the wound is cleaned up quite nicely I think it is time to change from Santyl to [facility]. They are using a local wrap here. There is edema in his  upper lower leg. 7/21; using  Hydrofera Blue. Nice improvement in surface area they are changing this every 2d 8/18; using Hydrofera Blue and bordered foam. Nice improvement in surface area 9/1; patient has some degree of lymphedema in the right leg we have not been wrapping him using Hydrofera Blue and bordered foam making decent progress in surface area. T oday he arrives with some erythema below the wound scaly skin this there is some form of contact dermatitis perhaps stasis dermatitis. I am not thinking that this is actually bacterial cellulitis as it is neither warm nor tender. The wound itself actually continues to look quite good 9/6; the patient's lymphedema in the right leg is considerably improved with compression. Also result of this the wound area is much better. We have been using Hydrofera Blue now under compression 9/14; still good edema control. Wound is smaller we have been using Hydrofera Blue for a prolonged period of time 9/20; still good edema control. Wound is slightly smaller but with a much healthier surface I changed to Iodoflex last week still under compression 9/30; edema control is good. Wound is smaller very healthy surface. I changed back to silver collagen today 10/6; edema control is adequate with 4-layer I am using silver collagen the wound measures smaller. Still some eschar and flaking skin around the margins 10/13; edema control is adequate. He had eschar over the wound area. Although they thought this might be closed when we remove the eschar there is still an open area. Therefore we will redress this and rewrap 10/20; edema control is good. He still does not have his stockings again the area is heavily covered in eschar. I gently remove this with a #5 curette I did not see anything precisely open here. However small draining area could easily do this. We are going to change him to polymen still under the same compression while he orders stockings 10/27 he has a stockings today however dry  flaky skin covering over a wound area that is not closed. This is disappointing. Using polymen under compression he has good edema control. This is worse than last week when I thought we did not have anything open 11/2; came in early because of pain in his feet caused by the wrap. For some reason they only called yesterday and he arrives today. Fortunately nothing really major. He got relief when we took the wrap off. The wound has some surface eschar but I cannot identify any open wound Objective Constitutional Sitting or standing Blood Pressure is within target range for patient.. Pulse regular and within target range for patient.[name] Kitchen Respirations regular, non-labored and within target range.. Temperature is normal and within the target range for the patient.[name] Kitchen Appears in no distress. Vitals Time Taken: 11:38 AM, Height: 73 in, Weight: 215 lbs, BMI: 28.4, Temperature: 98.5 F, Pulse: 112 bpm, Respiratory Rate: 18 breaths/min, Blood Pressure: 144/76 mmHg. General Notes: Wound exam; right posterior lateral calf. Scant amount of dry flaking skin over the surface I did not remove this. There is no evidence of surrounding infection everything looks fine here. Integumentary (Hair, Skin) Wound #1 status is Open. Original cause of wound was Surgical Injury. The date acquired was: 02/04/2020. The wound has been in treatment 18 weeks. The wound is located on the Right,Posterior Lower Leg. The wound measures 0.1cm length x 0.1cm width x 0.1cm depth; 0.008cm^2 area and 0.001cm^3 volume. There is Fat Layer (Subcutaneous Tissue) exposed. There is no tunneling or undermining noted. There is a small amount of  serosanguineous drainage noted. The wound margin is distinct with the outline attached to the wound base. There is large (67-100%) pink granulation within the wound bed. There is no necrotic tissue within the wound bed. Assessment Active Problems ICD-10 Type 2 diabetes mellitus with diabetic peripheral  angiopathy without gangrene Non-pressure chronic ulcer of right calf with other specified severity Plan 1. We put polymen on the wounded area and put him back in compression. 2 to our intake nurse felt it was the way the wrap was positioned that caused him discomfort. 3. May be healed by next week, he is to bring his stocking Electronic Signature(s) Signed: 11/13/2020 5:02:06 PM By: [name] Entered By: Linton Ham on 11/12/2020 11:53:40 -------------------------------------------------------------------------------- SuperBill Details Patient Name: Date of Service: Wayne Pratt, Wayne RY W. 11/12/2020 Medical Record Number: [id number] Patient Account Number: [id number] Date of Birth/Sex: Treating RN: [birth year] (60 y.o. [name] Primary Care Provider: [name] Other Clinician: Referring Provider: Treating Provider/Extender: Fredderick Severance Weeks in Treatment: 18 Diagnosis Coding ICD-10 Codes Code Description E11.51 Type 2 diabetes mellitus with diabetic peripheral angiopathy without gangrene L97.218 Non-pressure chronic ulcer of right calf with other specified severity Facility Procedures CPT4 Code: [id number] Description: (Facility Use Only) [phone] - [place] [facility] RT LEG Modifier: Quantity: 1 Physician Procedures : CPT4 Code Description Modifier [id number] - WC PHYS LEVEL 3 - EST PT ICD-10 Diagnosis Description E11.51 Type 2 diabetes mellitus with diabetic peripheral angiopathy without gangrene L97.218 Non-pressure chronic ulcer of right calf with other  specified severity Quantity: 1 Electronic Signature(s) Signed: 11/13/2020 5:02:06 PM By: [name] Signed: 11/13/2020 5:51:35 PM By: Levan Hurst RN, BSN Entered By: Levan Hurst on 11/12/2020 13:46:54

## 2020-11-14 DIAGNOSIS — I739 Peripheral vascular disease, unspecified: Secondary | ICD-10-CM | POA: Diagnosis not present

## 2020-11-14 DIAGNOSIS — M4722 Other spondylosis with radiculopathy, cervical region: Secondary | ICD-10-CM | POA: Diagnosis not present

## 2020-11-14 DIAGNOSIS — E1142 Type 2 diabetes mellitus with diabetic polyneuropathy: Secondary | ICD-10-CM | POA: Diagnosis not present

## 2020-11-14 DIAGNOSIS — Z79891 Long term (current) use of opiate analgesic: Secondary | ICD-10-CM | POA: Diagnosis not present

## 2020-11-19 ENCOUNTER — Other Ambulatory Visit: Payer: Self-pay

## 2020-11-19 ENCOUNTER — Encounter (HOSPITAL_BASED_OUTPATIENT_CLINIC_OR_DEPARTMENT_OTHER): Payer: PPO | Admitting: Internal Medicine

## 2020-11-19 DIAGNOSIS — E1151 Type 2 diabetes mellitus with diabetic peripheral angiopathy without gangrene: Secondary | ICD-10-CM | POA: Diagnosis not present

## 2020-11-19 DIAGNOSIS — L97218 Non-pressure chronic ulcer of right calf with other specified severity: Secondary | ICD-10-CM | POA: Diagnosis not present

## 2020-11-19 NOTE — Progress Notes (Addendum)
MURLIN, SCHRIEBER ([id number]) Visit Report for 11/19/2020 HPI Details Patient Name: Date of Service: Wayne Pratt, [place]. 11/19/2020 1:30 PM Medical Record Number: [id number] Patient Account Number: [id number] Date of Birth/Sex: Treating RN: 1960-03-31 (60 y.o. [name] Primary Care Provider: [name] Other Clinician: Referring Provider: Treating Provider/Extender: Fredderick Severance Weeks in Treatment: 19 History of Present Illness HPI Description: ADMISSION 07/04/2020 This is a 60 year old man who presented to Dr. Carlis Abbott in January of this year having critical limb ischemia. First angiogram was on 01/31/2020 showed proximal profound disease in the SFA with right foot runoff via diseased posterior tibia. He underwent a right femoral endarterectomy with a right common femoral to below-knee popliteal bypass on 02/04/2020. There were generally good vascular results however the day after the surgery according to his wife he developed a black eschar at presumably ischemic spot on the right posterior calf. He also ultimately had amputations on 02/29/2020 of the right first second and fifth toes in March she had a wound VAC on the right calf but this was too painful and was switched to wet-to-dry. At 1 point this clearly had exposed muscle and the wife was able to document this by showing me short movie on his phone. He had an operative debridement on the right calf in early April with a skin substitute o Myriad. In any case none of this really made any difference. He was hospitalized early in June with bilateral pulmonary emboli and is now on Eliquis. Long medical problem list but predominantly type 2 diabetes with peripheral neuropathy, spinal stenosis. He is a smoker Most recent arterial studies were done on 03/18/2020 on the right this showed a an ABI in the posterior tibial of 1.05 with biphasic waveforms on the left noncompressible at 1.38 but with a great toe pressure near normal  at 1.11 again with triphasic waveforms. 6/29; patient arrives back in clinic. We use Santyl on the posterior calf wound on the right. Arrives in clinic with this looking a lot better. Healthy red granulated tissue over the majority of this. 7/7 the wound is cleaned up quite nicely I think it is time to change from Santyl to [facility]. They are using a local wrap here. There is edema in his upper lower leg. 7/21; using Hydrofera Blue. Nice improvement in surface area they are changing this every 2d 8/18; using Hydrofera Blue and bordered foam. Nice improvement in surface area 9/1; patient has some degree of lymphedema in the right leg we have not been wrapping him using Hydrofera Blue and bordered foam making decent progress in surface area. T oday he arrives with some erythema below the wound scaly skin this there is some form of contact dermatitis perhaps stasis dermatitis. I am not thinking that this is actually bacterial cellulitis as it is neither warm nor tender. The wound itself actually continues to look quite good 9/6; the patient's lymphedema in the right leg is considerably improved with compression. Also result of this the wound area is much better. We have been using Hydrofera Blue now under compression 9/14; still good edema control. Wound is smaller we have been using Hydrofera Blue for a prolonged period of time 9/20; still good edema control. Wound is slightly smaller but with a much healthier surface I changed to Iodoflex last week still under compression 9/30; edema control is good. Wound is smaller very healthy surface. I changed back to silver collagen today 10/6; edema control is adequate with 4-layer I am  using silver collagen the wound measures smaller. Still some eschar and flaking skin around the margins 10/13; edema control is adequate. He had eschar over the wound area. Although they thought this might be closed when we remove the eschar there is still an open area.  Therefore we will redress this and rewrap 10/20; edema control is good. He still does not have his stockings again the area is heavily covered in eschar. I gently remove this with a #5 curette I did not see anything precisely open here. However small draining area could easily do this. We are going to change him to polymen still under the same compression while he orders stockings 10/27 he has a stockings today however dry flaky skin covering over a wound area that is not closed. This is disappointing. Using polymen under compression he has good edema control. This is worse than last week when I thought we did not have anything open 11/2; came in early because of pain in his feet caused by the wrap. For some reason they only called yesterday and he arrives today. fortunately nothing really major. He got relief when we took the wrap off. The wound has some surface eschar but I cannot identify any open wound 11/9; comes in today with a wound on the right lateral calf closed. This was initially an area that developed after revascularization. Potentially embolic. He has compression stockings Electronic Signature(s) Signed: 11/19/2020 4:30:00 PM By: [name] Entered By: Linton Ham on 11/19/2020 14:40:41 -------------------------------------------------------------------------------- Physical Exam Details Patient Name: Date of Service: Wayne Pratt, Wayne RY W. 11/19/2020 1:30 PM Medical Record Number: [id number] Patient Account Number: [id number] Date of Birth/Sex: Treating RN: 1960-04-01 (60 y.o. [name] Primary Care Provider: Other Clinician: [name] Referring Provider: Treating Provider/Extender: Fredderick Severance Weeks in Treatment: 19 Constitutional Sitting or standing Blood Pressure is within target range for patient.. Pulse regular and within target range for patient.[name] Kitchen Respirations regular, non-labored and within target range.. Temperature is normal and  within the target range for the patient.[name] Kitchen Appears in no distress. Notes Wound exam; right posterior lateral calf the area is totally closed. Dry skin in the area but nothing that required debridement. His edema control is excellent Electronic Signature(s) Signed: 11/19/2020 4:30:00 PM By: [name] Entered By: Linton Ham on 11/19/2020 14:41:30 -------------------------------------------------------------------------------- Physician Orders Details Patient Name: Date of Service: Wayne Pratt, Wayne W. 11/19/2020 1:30 PM Medical Record Number: [id number] Patient Account Number: [id number] Date of Birth/Sex: Treating RN: 12-21-1960 (60 y.o. [name], [name] Primary Care Provider: [name] Other Clinician: Referring Provider: Treating Provider/Extender: Christel Mormon in Treatment: 66 Verbal / Phone Orders: No Diagnosis Coding ICD-10 Coding Code Description E11.51 Type 2 diabetes mellitus with diabetic peripheral angiopathy without gangrene L97.218 Non-pressure chronic ulcer of right calf with other specified severity Discharge From [facility] Discharge from [place] Edema Control - Lymphedema / SCD / Other Elevate legs to the level of the heart or above for 30 minutes daily and/or when sitting, a frequency of: Avoid standing for long periods of time. Patient to wear own compression stockings every day. Compression stocking or Garment 20-30 mm/Hg pressure to: Wound Treatment Electronic Signature(s) Signed: 11/20/2020 5:10:51 PM By: [name] Signed: 11/21/2020 12:27:53 PM By: [name] RN Entered By: [name] on 11/19/2020 16:36:14 -------------------------------------------------------------------------------- Problem List Details Patient Name: Date of Service: Wayne Pratt, Wayne RY W. 11/19/2020 1:30 PM Medical Record Number: [id number] Patient Account Number: [id number] Date  of Birth/Sex: Treating RN: [birth year]  (60 y.o. 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 11/19/2020 1:30 PM Medical Record Number: [id number] Patient Account Number: [id number] Date of Birth/Sex: Treating RN: [birth year] (60 y.o. [name] Primary Care Provider: [name] Other Clinician: Referring Provider: Treating Provider/Extender: Fredderick Severance Weeks in Treatment: 19 Subjective History of Present Illness (HPI) ADMISSION 07/04/2020 This is a 60 year old man who presented to Dr. Carlis Abbott in January of this year having critical limb ischemia. First angiogram was on 01/31/2020 showed proximal profound disease in the SFA with right foot runoff via diseased posterior tibia. He underwent a right femoral endarterectomy with a right common femoral to below-knee popliteal bypass on 02/04/2020. There were generally good vascular results however the day after the surgery according to his wife he developed a black eschar at presumably ischemic spot on the right posterior calf. He also ultimately had amputations on 02/29/2020 of the right first second and fifth toes in March she had a wound VAC on the right calf but this was too painful and was switched to wet-to-dry. At 1 point this clearly had  exposed muscle and the wife was able to document this by showing me short movie on his phone. He had an operative debridement on the right calf in early April with a skin substitute o Myriad. In any case none of this really made any difference. He was hospitalized early in June with bilateral pulmonary emboli and is now on Eliquis. Long medical problem list but predominantly type 2 diabetes with peripheral neuropathy, spinal stenosis. He is a smoker Most recent arterial studies were done on 03/18/2020 on the right this showed a an ABI in the posterior tibial of 1.05 with biphasic waveforms on the left noncompressible at 1.38 but with a great toe pressure near normal at 1.11 again with triphasic waveforms. 6/29; patient arrives back in clinic. We use Santyl on the posterior calf wound on the right. Arrives in clinic with this looking a lot better. Healthy red granulated tissue over the majority of this. 7/7 the wound is cleaned up quite nicely I think it is time to change from Santyl to [facility]. They are using a local wrap here. There is edema in his upper lower leg. 7/21; using Hydrofera Blue. Nice improvement in surface area they are changing this every 2d 8/18; using Hydrofera Blue and bordered foam. Nice improvement in surface area 9/1; patient has some degree of lymphedema in the right leg we have not been wrapping him using Hydrofera Blue and bordered foam making decent progress in surface area. T oday he arrives with some erythema below the wound scaly skin this there is some form of contact dermatitis perhaps stasis dermatitis. I am not thinking that this is actually bacterial cellulitis as it is neither warm nor tender. The wound itself actually continues to look quite good 9/6; the patient's lymphedema in the right leg is considerably improved with compression. Also result of this the wound area  is much better. We have been using Hydrofera Blue now under compression 9/14; still good  edema control. Wound is smaller we have been using Hydrofera Blue for a prolonged period of time 9/20; still good edema control. Wound is slightly smaller but with a much healthier surface I changed to Iodoflex last week still under compression 9/30; edema control is good. Wound is smaller very healthy surface. I changed back to silver collagen today 10/6; edema control is adequate with 4-layer I am using silver collagen the wound measures smaller. Still some eschar and flaking skin around the margins 10/13; edema control is adequate. He had eschar over the wound area. Although they thought this might be closed when we remove the eschar there is still an open area. Therefore we will redress this and rewrap 10/20; edema control is good. He still does not have his stockings again the area is heavily covered in eschar. I gently remove this with a #5 curette I did not see anything precisely open here. However small draining area could easily do this. We are going to change him to polymen still under the same compression while he orders stockings 10/27 he has a stockings today however dry flaky skin covering over a wound area that is not closed. This is disappointing. Using polymen under compression he has good edema control. This is worse than last week when I thought we did not have anything open 11/2; came in early because of pain in his feet caused by the wrap. For some reason they only called yesterday and he arrives today. fortunately nothing really major. He got relief when we took the wrap off. The wound has some surface eschar but I cannot identify any open wound 11/9; comes in today with a wound on the right lateral calf closed. This was initially an area that developed after revascularization. Potentially embolic. He has compression stockings Objective Constitutional Sitting or standing Blood Pressure is within target range for patient.. Pulse regular and within target range for patient.[name] Kitchen  Respirations regular, non-labored and within target range.. Temperature is normal and within the target range for the patient.[name] Kitchen Appears in no distress. Vitals Time Taken: 1:50 PM, Height: 73 in, Weight: 215 lbs, BMI: 28.4, Temperature: 98.1 F, Pulse: 92 bpm, Respiratory Rate: 18 breaths/min, Blood Pressure: 133/75 mmHg. General Notes: Wound exam; right posterior lateral calf the area is totally closed. Dry skin in the area but nothing that required debridement. His edema control is excellent Integumentary (Hair, Skin) Wound #1 status is Open. Original cause of wound was Surgical Injury. The date acquired was: 02/04/2020. The wound has been in treatment 19 weeks. The wound is located on the Right,Posterior Lower Leg. The wound measures 0cm length x 0cm width x 0cm depth; 0cm^2 area and 0cm^3 volume. There is Fat Layer (Subcutaneous Tissue) exposed. There is a small amount of serosanguineous drainage noted. The wound margin is distinct with the outline attached to the wound base. There is large (67-100%) pink granulation within the wound bed. There is no necrotic tissue within the wound bed. Assessment Active Problems ICD-10 Type 2 diabetes mellitus with diabetic peripheral angiopathy without gangrene Non-pressure chronic ulcer of right calf with other specified severity Plan 1. Patient to be discharged from the clinic to his own compression stockings. Electronic Signature(s) Signed: 11/19/2020 4:30:00 PM By: [name] Entered By: Linton Ham on 11/19/2020 14:42:33 -------------------------------------------------------------------------------- SuperBill Details Patient Name: Date of Service: Wayne Pratt, Wayne RY W. 11/19/2020 Medical Record Number: [id number] Patient Account Number: [id number]  Date of Birth/Sex: Treating RN: 1960/08/30 (60 y.o. [name] Primary Care Provider: [name] Other Clinician: Referring Provider: Treating Provider/Extender: Fredderick Severance Weeks in Treatment: 19 Diagnosis Coding ICD-10 Codes Code Description E11.51 Type 2 diabetes mellitus with diabetic peripheral angiopathy without gangrene L97.218 Non-pressure chronic ulcer of right calf with other specified severity Facility Procedures CPT4 Code: [id number] Description: 99213 - WOUND CARE VISIT-LEV 3 EST PT Modifier: Quantity: 1 Physician Procedures : CPT4 Code Description Modifier [id number] - WC PHYS LEVEL 2 - EST PT ICD-10 Diagnosis Description E11.51 Type 2 diabetes mellitus with diabetic peripheral angiopathy without gangrene L97.218 Non-pressure chronic ulcer of right calf with other  specified severity Quantity: 1 Electronic Signature(s) Signed: 11/20/2020 5:10:51 PM By: [name] Signed: 11/21/2020 12:27:53 PM By: [name] RN Previous Signature: 11/19/2020 4:30:00 PM Version By: [name] Entered By: [name] on 11/19/2020 16:37:43

## 2020-11-21 NOTE — Progress Notes (Signed)
Wayne Pratt, Wayne Pratt ([id number]) Visit Report for 11/19/2020 Arrival Information Details Patient Name: Date of Service: Wayne Pratt. 11/19/2020 1:30 PM Medical Record Number: [id number] Patient Account Number: [id number] Date of Birth/Sex: Treating RN: 03/14/1960 (60 y.o. [name] Primary Care Anjenette Gerbino: [name] Other Clinician: Referring Ashantae Pangallo: Treating [place] Zou/Extender: Christel Mormon in Treatment: 108 Visit Information History Since Last Visit Added or deleted any medications: No Patient Arrived: Ambulatory Any new allergies or adverse reactions: No Arrival Time: 13:50 Had a fall or experienced change in No Accompanied By: wife activities of daily living that may affect Transfer Assistance: None risk of falls: Patient Identification Verified: Yes Signs or symptoms of abuse/neglect since last visito No Secondary Verification Process Completed: Yes Hospitalized since last visit: No Patient Requires Transmission-Based Precautions: No Implantable device outside of the clinic excluding No Patient Has Alerts: Yes cellular tissue based products placed in the center Patient Alerts: Patient on Blood Thinner since last visit: ABI's: R:1.05 03/22 Has Dressing in Place as Prescribed: Yes Pain Present Now: No Electronic Signature(s) Signed: 11/19/2020 1:53:08 PM By: Sandre Kitty Entered By: Sandre Kitty on 11/19/2020 13:50:57 -------------------------------------------------------------------------------- Clinic Level of Care Assessment Details Patient Name: Date of Service: Wayne Sprinkles W. 11/19/2020 1:30 PM Medical Record Number: [id number] Patient Account Number: [id number] Date of Birth/Sex: Treating RN: 1960/01/14 (60 y.o. Wayne Pratt, Wayne Pratt Primary Care Wayne Pratt: [name] Other Clinician: Referring Wayne Pratt: Treating Wayne Pratt/Extender: Christel Mormon in Treatment: 19 Clinic Level of Care Assessment  Items TOOL 4 Quantity Score X- 1 0 Use when only an EandM is performed on FOLLOW-UP visit ASSESSMENTS - Nursing Assessment / Reassessment X- 1 10 Reassessment of Co-morbidities (includes updates in patient status) X- 1 5 Reassessment of Adherence to Treatment Plan ASSESSMENTS - Wound and Skin A ssessment / Reassessment X - Simple Wound Assessment / Reassessment - one wound 1 5 []  - 0 Complex Wound Assessment / Reassessment - multiple wounds X- 1 10 Dermatologic / Skin Assessment (not related to wound area) ASSESSMENTS - Focused Assessment X- 1 5 Circumferential Edema Measurements - multi extremities []  - 0 Nutritional Assessment / Counseling / Intervention []  - 0 Lower Extremity Assessment (monofilament, tuning fork, pulses) []  - 0 Peripheral Arterial Disease Assessment (using hand held doppler) ASSESSMENTS - Ostomy and/or Continence Assessment and Care []  - 0 Incontinence Assessment and Management []  - 0 Ostomy Care Assessment and Management (repouching, etc.) PROCESS - Coordination of Care X - Simple Patient / Family Education for ongoing care 1 15 []  - 0 Complex (extensive) Patient / Family Education for ongoing care X- 1 10 Staff obtains Programmer, systems, Records, T Results / Process Orders est []  - 0 Staff telephones HHA, Nursing Homes / Clarify orders / etc []  - 0 Routine Transfer to another Facility (non-emergent condition) []  - 0 Routine Hospital Admission (non-emergent condition) []  - 0 New Admissions / Biomedical engineer / Ordering NPWT Apligraf, etc. , []  - 0 Emergency Hospital Admission (emergent condition) X- 1 10 Simple Discharge Coordination []  - 0 Complex (extensive) Discharge Coordination PROCESS - Special Needs []  - 0 Pediatric / Minor Patient Management []  - 0 Isolation Patient Management []  - 0 Hearing / Language / Visual special needs []  - 0 Assessment of Community assistance (transportation, D/C planning, etc.) []  - 0 Additional  assistance / Altered mentation []  - 0 Support Surface(s) Assessment (bed, cushion, seat, etc.) INTERVENTIONS - Wound Cleansing / Measurement X - Simple Wound Cleansing - one wound 1 5 []  -  0 Complex Wound Cleansing - multiple wounds X- 1 5 Wound Imaging (photographs - any number of wounds) []  - 0 Wound Tracing (instead of photographs) X- 1 5 Simple Wound Measurement - one wound []  - 0 Complex Wound Measurement - multiple wounds INTERVENTIONS - Wound Dressings X - Small Wound Dressing one or multiple wounds 1 10 []  - 0 Medium Wound Dressing one or multiple wounds []  - 0 Large Wound Dressing one or multiple wounds []  - 0 Application of Medications - topical []  - 0 Application of Medications - injection INTERVENTIONS - Miscellaneous []  - 0 External ear exam []  - 0 Specimen Collection (cultures, biopsies, blood, body fluids, etc.) []  - 0 Specimen(s) / Culture(s) sent or taken to Lab for analysis []  - 0 Patient Transfer (multiple staff / Civil Service fast streamer / Similar devices) []  - 0 Simple Staple / Suture removal (25 or less) []  - 0 Complex Staple / Suture removal (26 or more) []  - 0 Hypo / Hyperglycemic Management (close monitor of Blood Glucose) []  - 0 Ankle / Brachial Index (ABI) - do not check if billed separately X- 1 5 Vital Signs Has the patient been seen at the hospital within the last three years: Yes Total Score: 100 Level Of Care: New/Established - Level 3 Electronic Signature(s) Signed: 11/21/2020 12:27:53 PM By: [name] RN Entered By: [name] on 11/19/2020 16:37:33 -------------------------------------------------------------------------------- Encounter Discharge Information Details Patient Name: Date of Service: Wayne Pratt, Wayne RY W. 11/19/2020 1:30 PM Medical Record Number: [id number] Patient Account Number: [id number] Date of Birth/Sex: Treating RN: 1960/01/24 (60 y.o. [name] Primary Care Jance Siek: [name] Other  Clinician: Referring Stepheny Canal: Treating Farrell Broerman/Extender: Christel Mormon in Treatment: 19 Encounter Discharge Information Items Discharge Condition: Stable Ambulatory Status: Ambulatory Discharge Destination: Home Transportation: Private Auto Accompanied By: SELF Schedule Follow-up Appointment: Yes Clinical Summary of Care: Patient Declined Electronic Signature(s) Signed: 11/21/2020 12:27:53 PM By: [name] RN Entered By: [name] on 11/19/2020 16:39:49 -------------------------------------------------------------------------------- Multi Wound Chart Details Patient Name: Date of Service: Wayne Pratt, Wayne RY W. 11/19/2020 1:30 PM Medical Record Number: [id number] Patient Account Number: [id number] Date of Birth/Sex: Treating RN: 02-15-1960 (60 y.o. [name] Primary Care Thong Feeny: [name] Other Clinician: Referring Darrall Strey: Treating Jerald Hennington/Extender: Fredderick Severance Weeks in Treatment: 19 Vital Signs Height(in): 73 Pulse(bpm): 92 Weight(lbs): 215 Blood Pressure(mmHg): 133/75 Body Mass Index(BMI): 28 Temperature(F): 98.1 Respiratory Rate(breaths/min): 18 Photos: [1:Right, Posterior Lower Leg] [N/A:N/A N/A] Wound Location: [1:Surgical Injury] [N/A:N/A] Wounding Event: [1:Diabetic Wound/Ulcer of the Lower] [N/A:N/A] Primary Etiology: [1:Extremity Anemia, Asthma, Sleep Apnea,] [N/A:N/A] Comorbid History: [1:Hypertension, Peripheral Arterial Disease, Type II Diabetes, Rheumatoid Arthritis, Neuropathy 02/04/2020] [N/A:N/A] Date Acquired: [1:19] [N/A:N/A] Weeks of Treatment: [1:Open] [N/A:N/A] Wound Status: [1:0x0x0] [N/A:N/A] Measurements L x W x D (cm) [1:0] [N/A:N/A] A (cm) : rea [1:0] [N/A:N/A] Volume (cm) : [1:100.00%] [N/A:N/A] % Reduction in A rea: [1:100.00%] [N/A:N/A] % Reduction in Volume: [1:Grade 2] [N/A:N/A] Classification: [1:Small] [N/A:N/A] Exudate A mount: [1:Serosanguineous]  [N/A:N/A] Exudate Type: [1:red, brown] [N/A:N/A] Exudate Color: [1:Distinct, outline attached] [N/A:N/A] Wound Margin: [1:Large (67-100%)] [N/A:N/A] Granulation A mount: [1:Pink] [N/A:N/A] Granulation Quality: [1:None Present (0%)] [N/A:N/A] Necrotic A mount: [1:Fat Layer (Subcutaneous Tissue): Yes N/A] Exposed Structures: [1:Fascia: No Tendon: No Muscle: No Joint: No Bone: No Large (67-100%)] [N/A:N/A] Treatment Notes Electronic Signature(s) Signed: 11/19/2020 4:30:00 PM By: [name] Signed: 11/20/2020 5:34:00 PM By: Levan Hurst RN, BSN Entered By: Linton Ham on 11/19/2020 14:38:49 -------------------------------------------------------------------------------- [place] Details Patient Name: Date  of Service: Wayne Pratt, Wayne RY W. 11/19/2020 1:30 PM Medical Record Number: [id number] Patient Account Number: [id number] Date of Birth/Sex: Treating RN: [birth year] (60 y.o. [name] Primary Care Aysia Lowder: [name] Other Clinician: Referring Catalia Massett: Treating Nidia Grogan/Extender: Christel Mormon in Treatment: Lester reviewed with physician Active Inactive Electronic Signature(s) Signed: 11/21/2020 12:27:53 PM By: [name] RN Entered By: [name] on 11/19/2020 16:41:37 -------------------------------------------------------------------------------- Pain Assessment Details Patient Name: Date of Service: Wayne Pratt, Wayne RY W. 11/19/2020 1:30 PM Medical Record Number: [id number] Patient Account Number: [id number] Date of Birth/Sex: Treating RN: 09-27-1960 (60 y.o. [name] Primary Care Bindi Klomp: [name] Other Clinician: Referring Mervyn Pflaum: Treating Latriece Anstine/Extender: Fredderick Severance Weeks in Treatment: 19 Active Problems Location of Pain Severity and Description of Pain Patient Has Paino No Site Locations Pain Management and  Medication Current Pain Management: Electronic Signature(s) Signed: 11/19/2020 1:53:08 PM By: Sandre Kitty Signed: 11/20/2020 5:34:00 PM By: Levan Hurst RN, BSN Entered By: Sandre Kitty on 11/19/2020 13:51:21 -------------------------------------------------------------------------------- Patient/Caregiver Education Details Patient Name: Date of Service: Wayne Pratt, [facility] Hearing 11/9/2022andnbsp1:30 PM Medical Record Number: [id number] Patient Account Number: [id number] Date of Birth/Gender: Treating RN: Jul 20, 1960 (60 y.o. [name] Primary Care Physician: [name] Other Clinician: Referring Physician: Treating Physician/Extender: Christel Mormon in Treatment: 19 Education Assessment Education Provided To: Patient Education Topics Provided Elevated Blood Sugar/ Impact on Healing: Methods: Explain/Verbal Responses: State content correctly Electronic Signature(s) Signed: 11/21/2020 12:27:53 PM By: [name] RN Entered By: [name] on 11/19/2020 16:36:59 -------------------------------------------------------------------------------- Wound Assessment Details Patient Name: Date of Service: Wayne Pratt, Wayne RY W. 11/19/2020 1:30 PM Medical Record Number: [id number] Patient Account Number: [id number] Date of Birth/Sex: Treating RN: 04/15/1960 (60 y.o. [name] Primary Care Jacqualyn Sedgwick: [name] Other Clinician: Referring Xela Oregel: Treating Senya Hinzman/Extender: Fredderick Severance Weeks in Treatment: 19 Wound Status Wound Number: 1 Primary Diabetic Wound/Ulcer of the Lower Extremity Etiology: Wound Location: Right, Posterior Lower Leg Wound Open Wounding Event: Surgical Injury Status: Date Acquired: 02/04/2020 Comorbid Anemia, Asthma, Sleep Apnea, Hypertension, Peripheral Arterial Weeks Of Treatment: 19 History: Disease, Type II Diabetes, Rheumatoid Arthritis, Neuropathy Clustered Wound:  No Photos Wound Measurements Length: (cm) Width: (cm) Depth: (cm) Area: (cm) Volume: (cm) 0 % Reduction in Area: 100% 0 % Reduction in Volume: 100% 0 Epithelialization: Large (67-100%) 0 0 Wound Description Classification: Grade 2 Wound Margin: Distinct, outline attached Exudate Amount: Small Exudate Type: Serosanguineous Exudate Color: red, brown Foul Odor After Cleansing: No Slough/Fibrino No Wound Bed Granulation Amount: Large (67-100%) Exposed Structure Granulation Quality: Pink Fascia Exposed: No Necrotic Amount: None Present (0%) Fat Layer (Subcutaneous Tissue) Exposed: Yes Tendon Exposed: No Muscle Exposed: No Joint Exposed: No Bone Exposed: No Electronic Signature(s) Signed: 11/19/2020 1:53:08 PM By: Sandre Kitty Signed: 11/20/2020 5:34:00 PM By: Levan Hurst RN, BSN Entered By: Sandre Kitty on 11/19/2020 13:52:16 -------------------------------------------------------------------------------- Vitals Details Patient Name: Date of Service: Wayne Pratt, Wayne RY W. 11/19/2020 1:30 PM Medical Record Number: [id number] Patient Account Number: [id number] Date of Birth/Sex: Treating RN: 22-Apr-1960 (60 y.o. [name] Primary Care Beauregard Jarrells: [name] Other Clinician: Referring Isaid Salvia: Treating Shelby Peltz/Extender: Fredderick Severance Weeks in Treatment: 19 Vital Signs Time Taken: 13:50 Temperature (F): 98.1 Height (in): 73 Pulse (bpm): 92 Weight (lbs): 215 Respiratory Rate (breaths/min): 18 Body Mass Index (BMI): 28.4 Blood Pressure (mmHg): 133/75 Reference Range: 80 - 120 mg / dl Electronic Signature(s) Signed: 11/19/2020 1:53:08 PM By: Sandre Kitty Entered By:  Sandre Kitty on 11/19/2020 13:51:14

## 2020-12-08 DIAGNOSIS — M7541 Impingement syndrome of right shoulder: Secondary | ICD-10-CM | POA: Diagnosis not present

## 2020-12-29 DIAGNOSIS — Z13220 Encounter for screening for lipoid disorders: Secondary | ICD-10-CM | POA: Diagnosis not present

## 2020-12-29 DIAGNOSIS — Z7289 Other problems related to lifestyle: Secondary | ICD-10-CM | POA: Diagnosis not present

## 2020-12-29 DIAGNOSIS — Z1211 Encounter for screening for malignant neoplasm of colon: Secondary | ICD-10-CM | POA: Diagnosis not present

## 2020-12-29 DIAGNOSIS — I2699 Other pulmonary embolism without acute cor pulmonale: Secondary | ICD-10-CM | POA: Diagnosis not present

## 2020-12-29 DIAGNOSIS — E1140 Type 2 diabetes mellitus with diabetic neuropathy, unspecified: Secondary | ICD-10-CM | POA: Diagnosis not present

## 2020-12-29 DIAGNOSIS — Z125 Encounter for screening for malignant neoplasm of prostate: Secondary | ICD-10-CM | POA: Diagnosis not present

## 2020-12-29 DIAGNOSIS — Z0001 Encounter for general adult medical examination with abnormal findings: Secondary | ICD-10-CM | POA: Diagnosis not present

## 2021-01-02 IMAGING — DX DG FEMUR 2+V*R*
5 series · 5 of 5 positions shown · non-contrast
Comparison: None.

CLINICAL DATA: Pain after fall.

EXAM:
RIGHT FEMUR 2 VIEWS

[femur ap (1 of 3)]
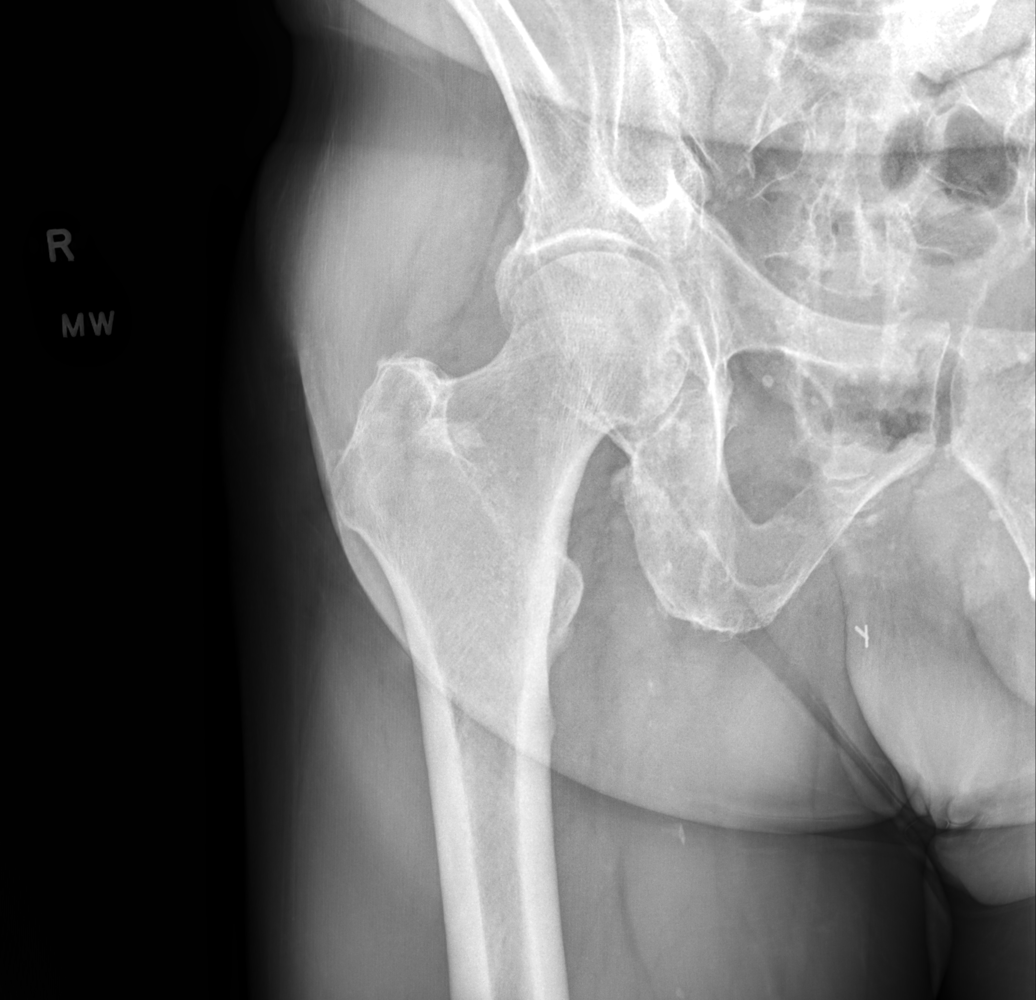

[femur ap (2 of 3)]
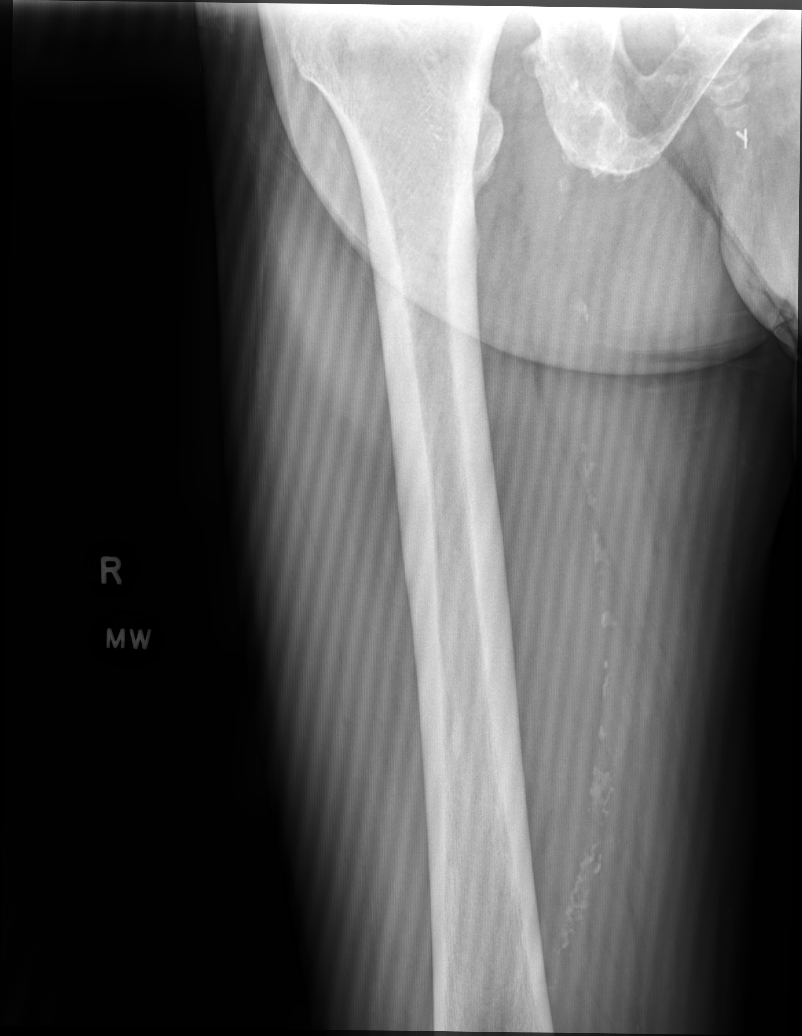

[femur lat (1 of 2)]
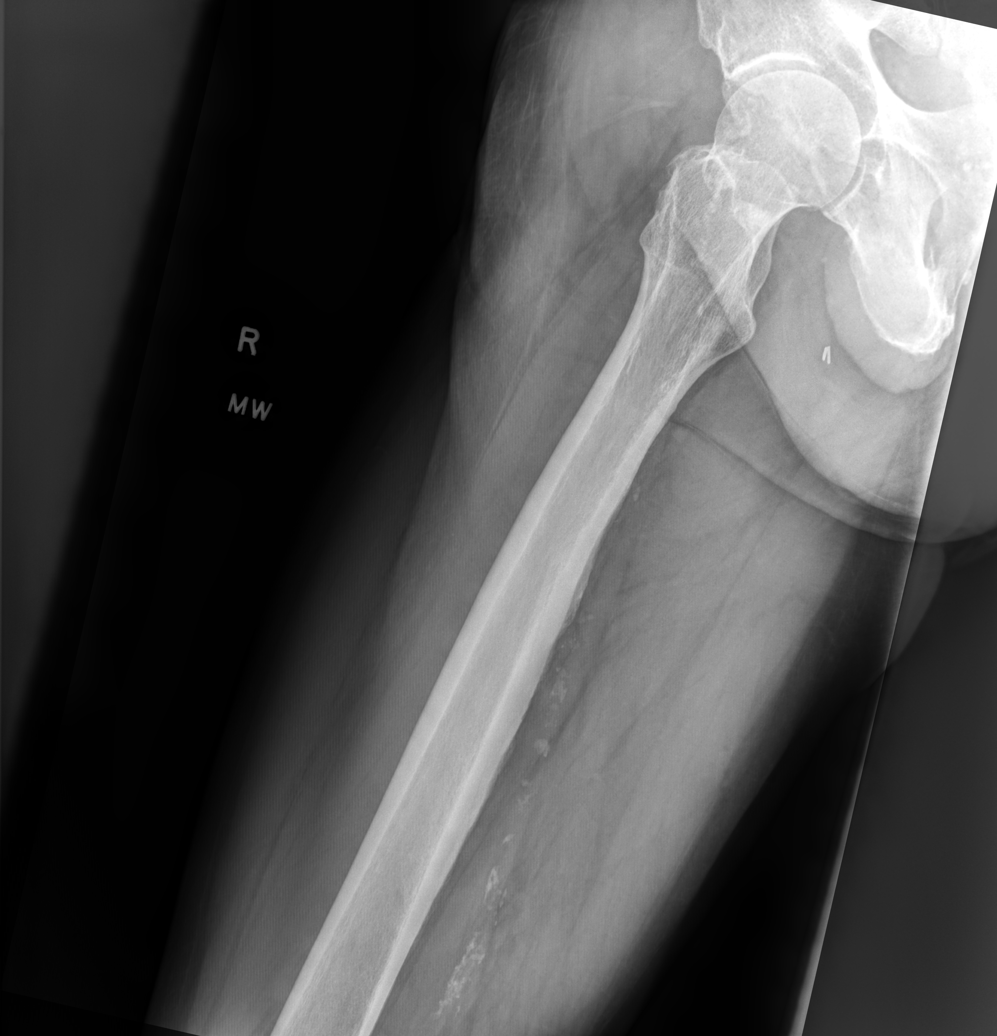

[femur ap (3 of 3)]
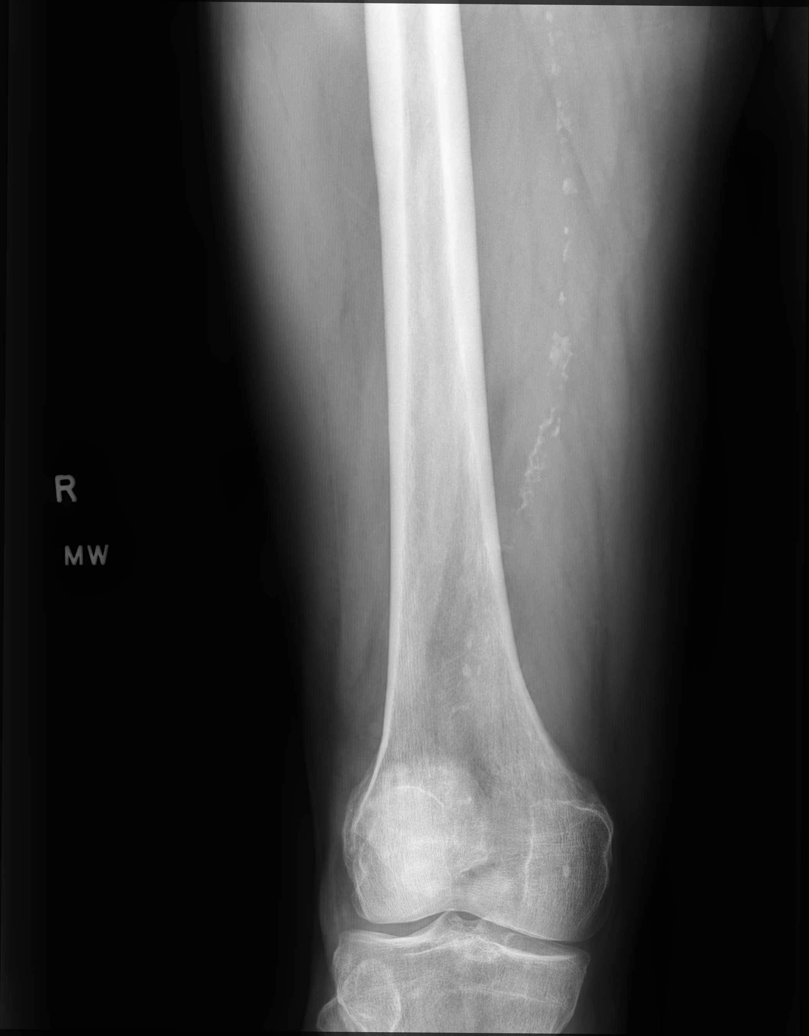

[femur lat (2 of 2)]
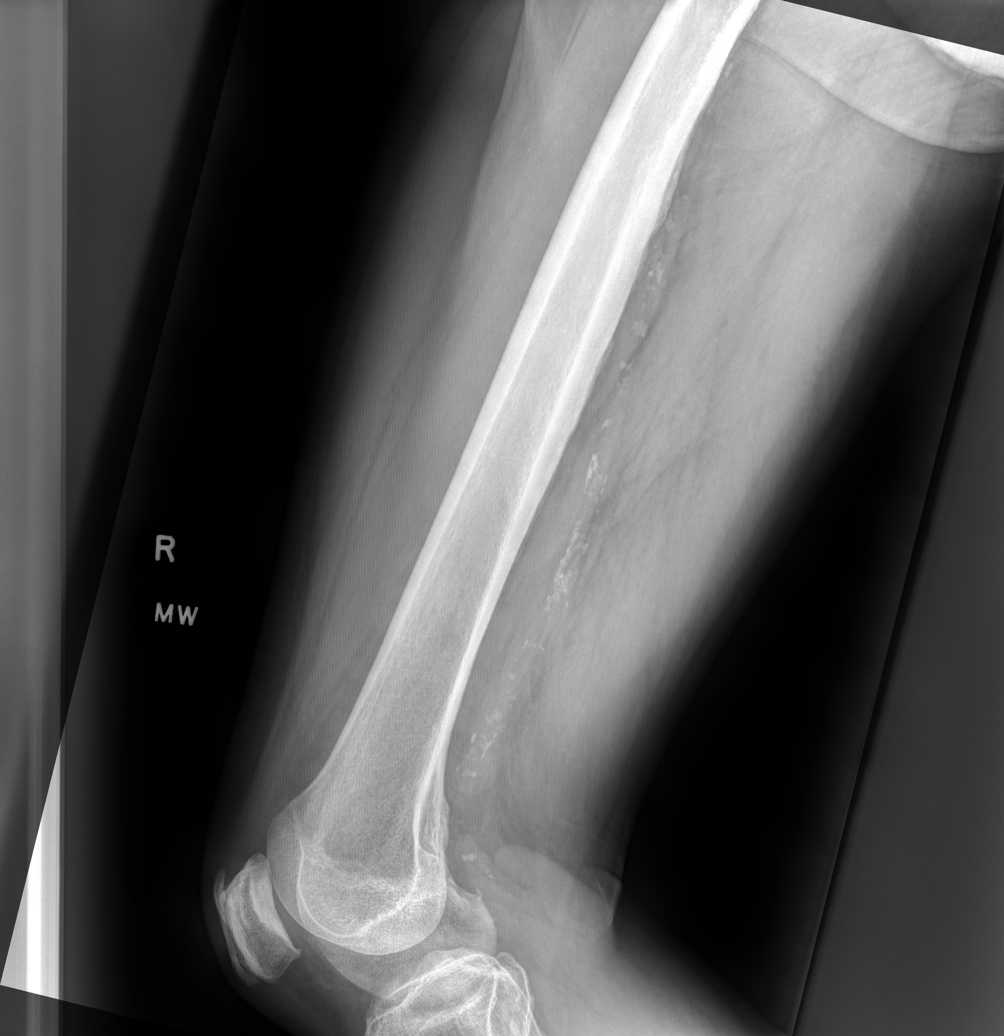

[5 of 5 positions shown; findings below may reference images not displayed]

FINDINGS: Degenerative change in the right hip without significant loss of
joint space. No fractures. Vascular calcifications are noted.
IMPRESSION: Degenerative change in the right hip. No fractures.

## 2021-01-13 ENCOUNTER — Other Ambulatory Visit: Payer: Self-pay | Admitting: Neurological Surgery

## 2021-01-13 DIAGNOSIS — M5416 Radiculopathy, lumbar region: Secondary | ICD-10-CM

## 2021-04-27 ENCOUNTER — Encounter (HOSPITAL_COMMUNITY): Payer: Self-pay

## 2021-04-27 ENCOUNTER — Emergency Department (HOSPITAL_COMMUNITY)
Admission: EM | Admit: 2021-04-27 | Discharge: 2021-04-27 | Disposition: A | Discharge status: Home / Self Care | Payer: PPO | Attending: Emergency Medicine | Admitting: Emergency Medicine

## 2021-04-27 ENCOUNTER — Other Ambulatory Visit: Payer: Self-pay

## 2021-04-27 ENCOUNTER — Emergency Department (HOSPITAL_COMMUNITY): Payer: PPO

## 2021-04-27 DIAGNOSIS — Z79899 Other long term (current) drug therapy: Secondary | ICD-10-CM | POA: Insufficient documentation

## 2021-04-27 DIAGNOSIS — I10 Essential (primary) hypertension: Secondary | ICD-10-CM | POA: Insufficient documentation

## 2021-04-27 DIAGNOSIS — Z7984 Long term (current) use of oral hypoglycemic drugs: Secondary | ICD-10-CM | POA: Insufficient documentation

## 2021-04-27 DIAGNOSIS — R072 Precordial pain: Secondary | ICD-10-CM | POA: Insufficient documentation

## 2021-04-27 DIAGNOSIS — E119 Type 2 diabetes mellitus without complications: Secondary | ICD-10-CM | POA: Diagnosis not present

## 2021-04-27 DIAGNOSIS — Z7982 Long term (current) use of aspirin: Secondary | ICD-10-CM | POA: Insufficient documentation

## 2021-04-27 DIAGNOSIS — R0602 Shortness of breath: Secondary | ICD-10-CM | POA: Insufficient documentation

## 2021-04-27 DIAGNOSIS — R079 Chest pain, unspecified: Secondary | ICD-10-CM | POA: Diagnosis present

## 2021-04-27 DIAGNOSIS — Z7901 Long term (current) use of anticoagulants: Secondary | ICD-10-CM | POA: Insufficient documentation

## 2021-04-27 DIAGNOSIS — M549 Dorsalgia, unspecified: Secondary | ICD-10-CM | POA: Insufficient documentation

## 2021-04-27 LAB — CBC
HCT: 42.3 % (ref 39.0–52.0)
Hemoglobin: 13.6 g/dL (ref 13.0–17.0)
MCH: 30.5 pg (ref 26.0–34.0)
MCHC: 32.2 g/dL (ref 30.0–36.0)
MCV: 94.8 fL (ref 80.0–100.0)
Platelets: 291 10*3/uL (ref 150–400)
RBC: 4.46 MIL/uL (ref 4.22–5.81)
RDW: 13.8 % (ref 11.5–15.5)
WBC: 8 10*3/uL (ref 4.0–10.5)
nRBC: 0 % (ref 0.0–0.2)

## 2021-04-27 LAB — D-DIMER, QUANTITATIVE: D-Dimer, Quant: 0.41 ug/mL-FEU (ref 0.00–0.50)

## 2021-04-27 LAB — BASIC METABOLIC PANEL
Anion gap: 8 (ref 5–15)
BUN: 16 mg/dL (ref 8–23)
CO2: 26 mmol/L (ref 22–32)
Calcium: 9.6 mg/dL (ref 8.9–10.3)
Chloride: 101 mmol/L (ref 98–111)
Creatinine, Ser: 1.1 mg/dL (ref 0.61–1.24)
GFR, Estimated: >60 mL/min (ref >60)
Glucose, Bld: 379 mg/dL — ABNORMAL HIGH (ref 70–99)
Potassium: 4.5 mmol/L (ref 3.5–5.1)
Sodium: 135 mmol/L (ref 135–145)

## 2021-04-27 LAB — TROPONIN I (HIGH SENSITIVITY)
Troponin I (High Sensitivity): 4 ng/L (ref <18)
Troponin I (High Sensitivity): 3 ng/L (ref <18)

## 2021-04-27 MED ORDER — FENTANYL CITRATE PF 50 MCG/ML IJ SOSY
100.0000 ug | PREFILLED_SYRINGE | Freq: Once | INTRAMUSCULAR | Status: AC
  Administered 2021-04-27: 100 ug via INTRAVENOUS
  Filled 2021-04-27: qty 2

## 2021-04-27 MED ORDER — IOHEXOL 350 MG/ML SOLN
100.0000 mL | Freq: Once | INTRAVENOUS | Status: AC | PRN
  Administered 2021-04-27: 75 mL via INTRAVENOUS

## 2021-04-27 NOTE — ED Notes (Signed)
MD notified that patient is requesting more pain meds ? ?

## 2021-04-27 NOTE — ED Triage Notes (Signed)
Patient reports mid chest pain into back x 1.5 weeks. Hx of PE but not on thinners anymore.  Also feels as though he can't get a deep breath in.  Patient takes narcotic pain m eds and muscle relaxers with no relief for pain.  ?

## 2021-04-27 NOTE — ED Provider Notes (Signed)
CTA shows no change in the aneurysm size no evidence of pulmonary embolus.  Patient stable for discharge and follow-up troponins x2 were normal.  Labs without significant abnormalities.  Patient does have a history of chronic pain.  Patient stable for discharge and follow-up. ?  ?Fredia Sorrow, MD ?04/27/21 1838 ? ?

## 2021-04-27 NOTE — Discharge Instructions (Signed)
Follow-up with your doctors.  CT scan without any acute findings. ?

## 2021-05-09 NOTE — ED Provider Notes (Signed)
?[facility] ?Provider Note ? ? ?CSN: [id number] ?Arrival date & time: 04/27/21  1016 ? ?  ? ?History ? ?Chief Complaint  ?Patient presents with  ? Shortness of Breath  ? ? ?Wayne Pratt is a 61 y.o. male. ? ?HPI ? ?  ?61 year old patient comes in with chief complaint of chest pain and back pain.  Symptoms have been present for about a week and a half.  He has history of PE, but not on any blood thinners anymore.  He also indicates that his pain is worse with deep inspiration.  He is on pain medications, but it is not helping.  Girlfriend at the bedside, states that patient's pain is as severe as it was with PE.  He has history of diabetes, hypertension, chronic back pain, and peripheral vascular disease.  No known CAD. ? ? ? ?Home Medications ?Prior to Admission medications   ?Medication Sig Start Date End Date Taking? Authorizing Provider  ?acetaminophen (TYLENOL) 650 MG CR tablet Take 1,300 mg by mouth every 8 (eight) hours as needed for pain.   Yes [provider]  ?albuterol (VENTOLIN HFA) 108 (90 Base) MCG/ACT inhaler Inhale 1-2 puffs into the lungs every 6 (six) hours as needed for wheezing or shortness of breath. 03/15/19  Yes Avegno, Darrelyn Hillock, FNP  ?clonazePAM (KLONOPIN) 0.5 MG tablet Take 0.5 mg by mouth 2 (two) times daily.  06/17/17  Yes [provider]  ?cyclobenzaprine (FLEXERIL) 10 MG tablet Take 10 mg by mouth 3 (three) times daily as needed. 03/09/21  Yes [provider]  ?GLIPIZIDE XL 10 MG 24 hr tablet Take 10 mg by mouth 2 (two) times daily. 01/17/19  Yes [provider]  ?JARDIANCE 25 MG TABS tablet Take 25 mg by mouth daily. 04/03/21  Yes [provider]  ?lisinopril (ZESTRIL) 10 MG tablet Take 1 tablet (10 mg total) by mouth daily. Please call to schedule appointment for further refills. Thanks! 04/24/20  Yes Donato Heinz, MD  ?lovastatin (MEVACOR) 40 MG tablet Take 40 mg by mouth at bedtime.  04/01/17  Yes [provider]  ?Melatonin 10 MG TABS Take 40 mg by mouth at bedtime.   Yes [provider]  ?metFORMIN (GLUCOPHAGE) 1000 MG tablet Take 1,000 mg by mouth 2 (two) times daily.  04/01/17  Yes [provider]  ?oxyCODONE 10 MG TABS Take 1 tablet (10 mg total) by mouth every 6 (six) hours as needed for breakthrough pain. 06/19/20  Yes Barton Dubois, MD  ?pioglitazone (ACTOS) 45 MG tablet Take 45 mg by mouth daily. 04/13/21  Yes [provider]  ?apixaban (ELIQUIS) 5 MG TABS tablet Take 1 tablet (5 mg total) by mouth 2 (two) times daily. ?Patient not taking: Reported on 04/27/2021 06/25/20   Barton Dubois, MD  ?aspirin EC 81 MG EC tablet Take 1 tablet (81 mg total) by mouth daily at 6 (six) AM. Swallow whole. ?Patient not taking: Reported on 07/15/2020 02/08/20   Dagoberto Ligas, PA-C  ?   ? ?Allergies    ?Penicillins, Vancomycin, and Ativan [lorazepam]   ? ?Review of Systems   ?Review of Systems ? ?Physical Exam ?Updated Vital Signs ?BP 120/76   Pulse 97   Temp 98.2 ?F (36.8 ?C) (Oral)   Resp 19   Ht '6\' 1"'$  (1.854 m)   Wt 104.3 kg   SpO2 96%   BMI 30.34 kg/m?  ?Physical Exam ?Vitals and nursing note reviewed.  ?Constitutional:   ?  Appearance: He is well-developed.  ?HENT:  ?   Head: Atraumatic.  ?Cardiovascular:  ?   Rate and Rhythm: Normal rate.  ?   Comments: 2+ and equal radial pulse, dorsalis pedis. ?Blood pressure checked in both extremities, the delta is less than 5 between the 2 extremities. ?Pulmonary:  ?   Effort: Pulmonary effort is normal.  ?   Breath sounds: No decreased breath sounds, wheezing, rhonchi or rales.  ?Musculoskeletal:  ?   Cervical back: Neck supple.  ?   Right lower leg: No edema.  ?   Left lower leg: No edema.  ?Skin: ?   General: Skin is warm.  ?Neurological:  ?   Mental Status: He is alert and oriented to person, place, and time.  ? ? ?ED Results / Procedures / Treatments   ?Labs ?(all labs ordered are listed, but only abnormal results are displayed) ?Labs Reviewed  ?BASIC  METABOLIC PANEL - Abnormal; Notable for the following components:  ?    Result Value  ? Glucose, Bld 379 (*)   ? All other components within normal limits  ?CBC  ?D-DIMER, QUANTITATIVE  ?TROPONIN I (HIGH SENSITIVITY)  ?TROPONIN I (HIGH SENSITIVITY)  ? ? ?EKG ?EKG Interpretation ? ?Date/Time:  Monday April 27 2021 10:32:12 EDT ?Ventricular Rate:  91 ?PR Interval:  122 ?QRS Duration: 94 ?QT Interval:  324 ?QTC Calculation: 399 ?R Axis:   30 ?Text Interpretation: Sinus rhythm No acute changes No significant change since last tracing Confirmed by Varney Biles [phone]) on 04/27/2021 12:51:33 PM ? ?Radiology ?No results found. ? ?Procedures ?Procedures  ? ? ?Medications Ordered in ED ?Medications  ?fentaNYL (SUBLIMAZE) injection 100 mcg (100 mcg Intravenous Given 04/27/21 1315)  ?iohexol (OMNIPAQUE) 350 MG/ML injection 100 mL (75 mLs Intravenous Contrast Given 04/27/21 1556)  ?fentaNYL (SUBLIMAZE) injection 100 mcg (100 mcg Intravenous Given 04/27/21 1658)  ? ? ?ED Course/ Medical Decision Making/ A&P ?Clinical Course as of 05/09/21 1326  ?Sat May 09, 2021  ?1322 Troponin I (High Sensitivity): 3 [AN]  ?  ?Clinical Course User Index ?[AN] Varney Biles, MD  ? ?                        ?Medical Decision Making ?Amount and/or Complexity of Data Reviewed ?Labs: ordered. ?Radiology: ordered. ? ?Risk ?Prescription drug management. ? ? ?This patient presents to the ED with chief complaint(s) of chest pain, back pain for the last week and a half with pertinent past medical history of diabetes, PVD, prior PE and peripheral vascular disease which further complicates the presenting complaint. The complaint involves an extensive differential diagnosis and treatment options and also carries with it a high risk of complications and morbidity.   ? ?The differential diagnosis includes : Pulmonary embolism, ACS, aortic dissection, esophageal spasm, peptic ulcer disease, musculoskeletal pain. ? ?Of note, I have reviewed patient's previous  imaging.  It does appear that he had a small thoracic artery aneurysm.  ? ?The initial plan is to order troponins, D-dimer, basic blood work-up. ?Chest x-ray and EKG also ordered and reviewed ? ? ?Additional history obtained: ?Additional history obtained from spouse ?Records reviewed previous admission documents -reviewed previous imaging, endoscopy, PCP visit ? ? ?Independent visualization of imaging: ?- Imaging that was independently visualized with scope of interpretation limited to determining acute life threatening conditions related to emergency care: X-ray of the chest, which revealed no evidence of widened mediastinum or pneumothorax ? ?Treatment and Reassessment: ?Patient's initial troponin is normal.  His D-dimer is also reassuring. ?My suspicion for dissection is low given the normal neurovascular exam and the pain being present for more than a week now. ?EKG and troponin are also reassuring. ? ?Discussed the case with radiology service.  The radiologist recommends that a CT angio PE would be better than a CT dissection in the case where we are concerned for both PE and want to evaluate the aneurysm.  The aneurysm was not large enough to be at risk for dissection. ? ? ?Consideration for admission or further workup: Considered admission to the hospital as well.  However, patient's symptoms have been present for a week and a half, which does make ACS less likely.  The pain has been constant.  We still think given his risk factors, he will benefit with outpatient cardiology follow-up with his work-up here is negative. ? ?Patient's care will be signed out to Dr. Rogene Houston.  He will reassess the patient and decide if patient needs admission for chest pain work-up or now are up agreed upon plan of return precautions and cardiology follow-up is more appropriate.  Patient made aware of the plan at the time of signout as well. ? ? ? ?Final Clinical Impression(s) / ED Diagnoses ?Final diagnoses:  ?Precordial pain   ? ? ?Rx / DC Orders ?ED Discharge Orders   ? ? None  ? ?  ? ? ?  ?Varney Biles, MD ?05/09/21 1326 ? ?

## 2021-05-14 ENCOUNTER — Encounter: Payer: Self-pay | Admitting: Cardiology

## 2021-05-14 ENCOUNTER — Ambulatory Visit: Payer: PPO | Admitting: Cardiology

## 2021-05-14 ENCOUNTER — Ambulatory Visit: Payer: PPO | Admitting: Physician Assistant

## 2021-05-14 ENCOUNTER — Encounter: Payer: Self-pay | Admitting: *Deleted

## 2021-05-14 VITALS — BP 134/72 | HR 104 | Ht 73.0 in | Wt 231.4 lb

## 2021-05-14 DIAGNOSIS — I2510 Atherosclerotic heart disease of native coronary artery without angina pectoris: Secondary | ICD-10-CM | POA: Diagnosis not present

## 2021-05-14 DIAGNOSIS — E782 Mixed hyperlipidemia: Secondary | ICD-10-CM | POA: Diagnosis not present

## 2021-05-14 DIAGNOSIS — I10 Essential (primary) hypertension: Secondary | ICD-10-CM

## 2021-05-14 DIAGNOSIS — R079 Chest pain, unspecified: Secondary | ICD-10-CM | POA: Diagnosis not present

## 2021-05-14 MED ORDER — LISINOPRIL 5 MG PO TABS: 5.0000 mg | ORAL_TABLET | Freq: Every day | ORAL | 1 refills | Status: DC

## 2021-05-14 NOTE — Progress Notes (Signed)
? ? ?Cardiology Office Note ? ?Date: 05/14/2021  ? ?ID: BRYLIN STANISLAWSKI, DOB [birth year], MRN [id number] ? ?PCP:  Leeanne Rio, MD  ?Cardiologist:  Donato Heinz, MD ?Electrophysiologist:  None  ? ?Chief Complaint  ?Patient presents with  ? Chest pain  ? ? ?History of Present Illness: ?Wayne Pratt is a 61 y.o. male patient seen by Dr. Gardiner Rhyme in January 2021.  He is referred back to the office by Dr. Huel Cote for evaluation of chest discomfort.  I reviewed extensive records and updated the chart.  This is our first meeting today.  He is here with his wife. ? ?Records indicate ER visit in April with shortness of breath, back pain, and chest discomfort.  D-dimer and high-sensitivity troponin I levels were normal.  He underwent chest CTA that showed no evidence of pulmonary embolus.  Aneurysmal dilatation of the ascending aorta was stable at 4 cm.  Coronary and aortic atherosclerosis was incidentally noted, and had been seen on prior CT imaging. ? ?Per Dr. Newman Nickels note in January 2021 plan was for a coronary CTA.  It does not appear that this ever occurred. ? ?We discussed his symptoms.  He has been experiencing a sharp, stabbing discomfort in his chest intermittently over the last month.  Actually originates in his back and wraps around to the front.  No definite precipitant by exertion or movement of his thorax.  His wife states that initially his blood pressure has been fairly low, had to come off lisinopril.  More recently it has started to increase again and was as high as the 160s over the 90s. ? ?I personally reviewed his ECG today which shows sinus tachycardia, no acute ST segment changes. ? ?He has type 2 diabetes mellitus, also chronic pain with spinal stimulator.  I reviewed his medications. ? ?Past Medical History:  ?Diagnosis Date  ? Anemia   ? Anxiety   ? Arthritis   ? Asthma   ? Cervical disc disease   ? Essential hypertension   ? History of pleural empyema   ? History of pneumonia   ? History  of skin cancer   ? Hypertension   ? Insomnia   ? Migraine   ? Mixed hyperlipidemia   ? Nephrolithiasis   ? Neuropathy   ? PAD (peripheral artery disease) ([place])   ? Sleep apnea   ? Stop Bang score of 7  ? Type 2 diabetes mellitus ([place])   ? ? ?Past Surgical History:  ?Procedure Laterality Date  ? ABDOMINAL AORTOGRAM W/LOWER EXTREMITY N/A 01/31/2020  ? Procedure: ABDOMINAL AORTOGRAM W/LOWER EXTREMITY;  Surgeon: Marty Heck, MD;  Location: [facility];  Service: Cardiovascular;  Laterality: N/A;  ? AMPUTATION Right 02/29/2020  ? Procedure: RIGHT TOE AMPUTATIONS, 1, 2, 5;  Surgeon: Marty Heck, MD;  Location: [place];  Service: Vascular;  Laterality: Right;  ? APPLICATION OF WOUND VAC Right 02/29/2020  ? Procedure: APPLICATION OF WOUND VAC;  Surgeon: Marty Heck, MD;  Location: [place];  Service: Vascular;  Laterality: Right;  ? APPLICATION OF WOUND VAC Right 04/16/2020  ? Procedure: APPLICATION OF WOUND VAC;  Surgeon: Serafina Mitchell, MD;  [facility];  Service: Vascular;  Laterality: Right;  ? BIOPSY  10/05/2011  ? Dr. Oneida Alar :non-erosive gastritis  ? BIOPSY  08/06/2014  ? Procedure: BIOPSY;  Surgeon: Danie Binder, MD;  [facility];  Service: Endoscopy;;  ? CERVICAL DISC SURGERY    ? ELBOW SURGERY  Right   ? ENDARTERECTOMY FEMORAL Right 02/04/2020  ? Procedure: RIGHT FEMORAL ENDARTERECTOMY with Profundaplasty;  Surgeon: Marty Heck, MD;  Location: [place];  Service: Vascular;  Laterality: Right;  ? ESOPHAGOGASTRODUODENOSCOPY  Sept 2013  ? Dr. Oneida Alar: chronic inactive gastritis   ? ESOPHAGOGASTRODUODENOSCOPY N/A 08/18/2017  ? Procedure: ESOPHAGOGASTRODUODENOSCOPY (EGD);  Surgeon: Milus Banister, MD;  Location: [facility];  Service: Endoscopy;  Laterality: N/A;  ? ESOPHAGOGASTRODUODENOSCOPY N/A 03/09/2018  ? Procedure: ESOPHAGOGASTRODUODENOSCOPY (EGD);  Surgeon: Milus Banister, MD;  Location: [facility];  Service: Endoscopy;  Laterality: N/A;  ? ESOPHAGOGASTRODUODENOSCOPY  (EGD) WITH PROPOFOL N/A 08/06/2014  ? mild non-erosive gastritis, duodenitis  ? EUS N/A 08/18/2017  ? Procedure: UPPER ENDOSCOPIC ULTRASOUND (EUS) RADIAL;  Surgeon: Milus Banister, MD;  [facility];  Service: Endoscopy;  Laterality: N/A;  ? EUS N/A 03/09/2018  ? Procedure: UPPER ENDOSCOPIC ULTRASOUND (EUS) RADIAL;  Surgeon: Milus Banister, MD;  [facility];  Service: Endoscopy;  Laterality: N/A;  ? FEMORAL-POPLITEAL BYPASS GRAFT Right 02/04/2020  ? Procedure: BYPASS GRAFT FEMORAL-POPLITEAL ARTERY With Vein Harvest;  Surgeon: Marty Heck, MD;  Location: [place];  Service: Vascular;  Laterality: Right;  ? FINE NEEDLE ASPIRATION  03/09/2018  ? Procedure: FINE NEEDLE ASPIRATION;  Surgeon: Milus Banister, MD;  [facility];  Service: Endoscopy;;  ? FLEXIBLE SIGMOIDOSCOPY  10/05/2011  ? Dr. Fields:poor prep  ? I & D EXTREMITY Right 04/16/2020  ? Procedure: RIGHT CALF WOUND IRRIGATION, DEBRIDEMENT AND GRAFTING;  Surgeon: Serafina Mitchell, MD;  Location: [place];  Service: Vascular;  Laterality: Right;  ? LUNG SURGERY  2010  ? for empyema  ? PATCH ANGIOPLASTY Right 02/04/2020  ? Procedure: PATCH ANGIOPLASTY;  Surgeon: Marty Heck, MD;  Location: [place];  Service: Vascular;  Laterality: Right;  ? SPINAL CORD STIMULATOR INSERTION N/A 08/01/2015  ? Procedure: Spinal Cord Stimulator Implant;  Surgeon: Eustace Moore, MD;  Location: [place] [facility];  Service: Neurosurgery;  Laterality: N/A;  ? TONSILLECTOMY    ? VASECTOMY Bilateral 1995  ? VEIN HARVEST Right 02/04/2020  ? Procedure: VEIN HARVEST Greater Saphenous Vein;  Surgeon: Marty Heck, MD;  Location: [place];  Service: Vascular;  Laterality: Right;  ? WOUND DEBRIDEMENT Right 02/29/2020  ? Procedure: RIGHT CALF DEBRIDEMENT;  Surgeon: Marty Heck, MD;  Location: [place];  Service: Vascular;  Laterality: Right;  ? ? ?Current Outpatient Medications  ?Medication Sig Dispense Refill  ? acetaminophen (TYLENOL) 650 MG CR tablet Take  1,300 mg by mouth every 8 (eight) hours as needed for pain.    ? albuterol (VENTOLIN HFA) 108 (90 Base) MCG/ACT inhaler Inhale 1-2 puffs into the lungs every 6 (six) hours as needed for wheezing or shortness of breath. 18 g 0  ? aspirin EC 81 MG EC tablet Take 1 tablet (81 mg total) by mouth daily at 6 (six) AM. Swallow whole. 30 tablet 11  ? clonazePAM (KLONOPIN) 0.5 MG tablet Take 0.5 mg by mouth 2 (two) times daily.     ? cyclobenzaprine (FLEXERIL) 10 MG tablet Take 10 mg by mouth 3 (three) times daily as needed.    ? GLIPIZIDE XL 10 MG 24 hr tablet Take 10 mg by mouth 2 (two) times daily.    ? JARDIANCE 25 MG TABS tablet Take 25 mg by mouth daily.    ? lisinopril (ZESTRIL) 5 MG tablet Take 1 tablet (5 mg total) by mouth daily. 90 tablet 1  ? lovastatin (  MEVACOR) 40 MG tablet Take 40 mg by mouth at bedtime.     ? Melatonin 10 MG TABS Take 40 mg by mouth at bedtime.    ? metFORMIN (GLUCOPHAGE) 1000 MG tablet Take 1,000 mg by mouth 2 (two) times daily.     ? oxyCODONE 10 MG TABS Take 1 tablet (10 mg total) by mouth every 6 (six) hours as needed for breakthrough pain. 28 tablet 0  ? pioglitazone (ACTOS) 45 MG tablet Take 45 mg by mouth daily.    ? ?No current facility-administered medications for this visit.  ? ?Allergies:  Penicillins, Vancomycin, and Ativan [lorazepam]  ? ?Social History: The patient  reports that he has been smoking cigarettes. He has a 39.00 pack-year smoking history. He has never used smokeless tobacco. He reports that he does not drink alcohol and does not use drugs.  ? ?Family History: The patient's family history includes COPD in his maternal grandmother; Cancer in his paternal grandmother; Heart attack in his father; Prostate cancer in an other family member.  ? ?ROS: No orthopnea or PND.  No syncope. ? ?Physical Exam: ?VS:  BP 134/72   Pulse (!) 104   Ht '6\' 1"'$  (1.854 m)   Wt 231 lb 6.4 oz (105 kg)   SpO2 98%   BMI 30.53 kg/m? , BMI Body mass index is 30.53 kg/m?. ? ?Wt Readings from  Last 3 Encounters:  ?05/14/21 231 lb 6.4 oz (105 kg)  ?04/27/21 230 lb (104.3 kg)  ?10/14/20 238 lb (108 kg)  ?  ?General: Patient appears comfortable at rest. ?HEENT: Conjunctiva and lids normal, oropharynx cle

## 2021-05-14 NOTE — Patient Instructions (Addendum)
Medication Instructions:  ?Your physician has recommended you make the following change in your medication:  ?Resume lisinopril at 5 mg daily ?Continue other medications the same ? ?Labwork: ?none ? ?Testing/Procedures: ?Your physician has requested that you have a lexiscan myoview. For further information please visit [email or web address]. Please follow instruction sheet, as given. ? ?Follow-Up: ?Your physician recommends that you schedule a follow-up appointment in: pending ? ?Any Other Special Instructions Will Be Listed Below (If Applicable). ? ?If you need a refill on your cardiac medications before your next appointment, please call your pharmacy. ?

## 2021-05-19 ENCOUNTER — Ambulatory Visit (HOSPITAL_COMMUNITY): Payer: PPO

## 2021-05-19 ENCOUNTER — Encounter (HOSPITAL_COMMUNITY): Payer: PPO

## 2021-07-21 ENCOUNTER — Telehealth: Payer: Self-pay

## 2021-07-21 DIAGNOSIS — I70229 Atherosclerosis of native arteries of extremities with rest pain, unspecified extremity: Secondary | ICD-10-CM

## 2021-07-21 DIAGNOSIS — I739 Peripheral vascular disease, unspecified: Secondary | ICD-10-CM

## 2021-07-21 NOTE — Telephone Encounter (Signed)
Pt's wife called stating that the pt's R foot was "blood red", it hurt so much that he could barely walk, and it has been swelling.  Reviewed pt's chart, returned call, two identifiers used. Pt's wife stated that the symptoms began approx 1 week ago and the swelling has improved d/t pt elevating foot. The pt is experiencing constant pain in his leg and foot, but worse when walking, and it's tender where his previous wound was located. He denies any open areas. He has not been wearing compression stockings. Appts for [place] and PA were made with pt's availability. Confirmed understanding.

## 2021-07-22 NOTE — Progress Notes (Unsigned)
HISTORY AND PHYSICAL     CC:  follow up. Requesting Provider:  Leeanne Rio, MD  HPI: This is a 61 y.o. male who is here today for follow up for PAD.  Pt has hx of right common femoral endarterectomy with profundoplasty and bovine patch with a right common femoral to below-knee pop bypass with saphenous vein on 02/04/2020 for CLI with rest pain by Dr. Carlis Abbott.  At the time of his bypass had advanced ischemia with ABIs that were 0.  He subsequently underwent partial toe amputations of right toes 1 2 and 5 on 02/29/2020 by Dr. Carlis Abbott.  He subsequently underwent sharp debridement of skin, subcu tissue, muscle and tendon of right calf ulcer and application of autologous skin substitute and placement of vac on 04/16/2020 by Dr. Trula Slade.   Pt was last seen 10/14/2020 and at that time, he did not have any new concerns and the wound on his calf was almost healed.  He was not having any pain in the foot.  He was doing well and was scheduled for one year follow up.   Pt returns to clinic today with his wife with concerns about redness of the right lower leg and swelling.  He denies any claudication or rest pain.  His amputation sites for his toes have healed nicely.  He states he does get some cramping in his legs at night but denies pain in his foot.  He does have some numbness as well since surgery.  He has tried to wear his compression sock, but this is painful for him especially putting on and taking off and has not been able to tolerate this.    He continues to smoke but he and his wife both are working on quitting.  He is diabetic and is to start insulin today.  He goes for a nuclear stress test next week.   The pt returns today for follow up.   The pt is on a statin for cholesterol management.    The pt is on an aspirin.    Other AC:  none The pt is on ACEI for hypertension.  The pt does  have diabetes. Tobacco hx:  current   Past Medical History:  Diagnosis Date   Anemia    Anxiety     Arthritis    Asthma    Cervical disc disease    Essential hypertension    History of pleural empyema    History of pneumonia    History of skin cancer    Hypertension    Insomnia    Migraine    Mixed hyperlipidemia    Nephrolithiasis    Neuropathy    PAD (peripheral artery disease) ([facility])    Sleep apnea    Stop Bang score of 7   Type 2 diabetes mellitus ([place])     Past Surgical History:  Procedure Laterality Date   ABDOMINAL AORTOGRAM W/LOWER EXTREMITY N/A 01/31/2020   Procedure: ABDOMINAL AORTOGRAM W/LOWER EXTREMITY;  Surgeon: Marty Heck, MD;  Location: [place];  Service: Cardiovascular;  Laterality: N/A;   AMPUTATION Right 02/29/2020   Procedure: RIGHT TOE AMPUTATIONS, 1, 2, 5;  Surgeon: Marty Heck, MD;  Location: [place];  Service: Vascular;  Laterality: Right;   APPLICATION OF WOUND VAC Right 02/29/2020   Procedure: APPLICATION OF WOUND VAC;  Surgeon: Marty Heck, MD;  Location: [place];  Service: Vascular;  Laterality: Right;   APPLICATION OF WOUND VAC Right 04/16/2020   Procedure: APPLICATION  OF WOUND VAC;  Surgeon: Serafina Mitchell, MD;  [facility];  Service: Vascular;  Laterality: Right;   BIOPSY  10/05/2011   Dr. Oneida Alar :non-erosive gastritis   BIOPSY  08/06/2014   Procedure: BIOPSY;  Surgeon: Danie Binder, MD;  [facility];  Service: Endoscopy;;   CERVICAL DISC SURGERY     ELBOW SURGERY Right    ENDARTERECTOMY FEMORAL Right 02/04/2020   Procedure: RIGHT FEMORAL ENDARTERECTOMY with Profundaplasty;  Surgeon: Marty Heck, MD;  Location: [place];  Service: Vascular;  Laterality: Right;   ESOPHAGOGASTRODUODENOSCOPY  Sept 2013   Dr. Oneida Alar: chronic inactive gastritis    ESOPHAGOGASTRODUODENOSCOPY N/A 08/18/2017   Procedure: ESOPHAGOGASTRODUODENOSCOPY (EGD);  Surgeon: Milus Banister, MD;  Location: [facility];  Service: Endoscopy;  Laterality: N/A;   ESOPHAGOGASTRODUODENOSCOPY N/A 03/09/2018   Procedure:  ESOPHAGOGASTRODUODENOSCOPY (EGD);  Surgeon: Milus Banister, MD;  Location: [facility];  Service: Endoscopy;  Laterality: N/A;   ESOPHAGOGASTRODUODENOSCOPY (EGD) WITH PROPOFOL N/A 08/06/2014   mild non-erosive gastritis, duodenitis   EUS N/A 08/18/2017   Procedure: UPPER ENDOSCOPIC ULTRASOUND (EUS) RADIAL;  Surgeon: Milus Banister, MD;  [facility];  Service: Endoscopy;  Laterality: N/A;   EUS N/A 03/09/2018   Procedure: UPPER ENDOSCOPIC ULTRASOUND (EUS) RADIAL;  Surgeon: Milus Banister, MD;  [facility];  Service: Endoscopy;  Laterality: N/A;   FEMORAL-POPLITEAL BYPASS GRAFT Right 02/04/2020   Procedure: BYPASS GRAFT FEMORAL-POPLITEAL ARTERY With Vein Harvest;  Surgeon: Marty Heck, MD;  Location: [place];  Service: Vascular;  Laterality: Right;   FINE NEEDLE ASPIRATION  03/09/2018   Procedure: FINE NEEDLE ASPIRATION;  Surgeon: Milus Banister, MD;  [facility];  Service: Endoscopy;;   FLEXIBLE SIGMOIDOSCOPY  10/05/2011   Dr. Fields:poor prep   I & D EXTREMITY Right 04/16/2020   Procedure: RIGHT CALF WOUND IRRIGATION, DEBRIDEMENT AND GRAFTING;  Surgeon: Serafina Mitchell, MD;  Location: [place];  Service: Vascular;  Laterality: Right;   LUNG SURGERY  2010   for empyema   PATCH ANGIOPLASTY Right 02/04/2020   Procedure: PATCH ANGIOPLASTY;  Surgeon: Marty Heck, MD;  Location: [place];  Service: Vascular;  Laterality: Right;   SPINAL CORD STIMULATOR INSERTION N/A 08/01/2015   Procedure: Spinal Cord Stimulator Implant;  Surgeon: Eustace Moore, MD;  Location: [facility];  Service: Neurosurgery;  Laterality: N/A;   TONSILLECTOMY     VASECTOMY Bilateral 1995   VEIN HARVEST Right 02/04/2020   Procedure: VEIN HARVEST Greater Saphenous Vein;  Surgeon: Marty Heck, MD;  Location: [place];  Service: Vascular;  Laterality: Right;   WOUND DEBRIDEMENT Right 02/29/2020   Procedure: RIGHT CALF DEBRIDEMENT;  Surgeon: Marty Heck, MD;  Location: [place];   Service: Vascular;  Laterality: Right;    Allergies  Allergen Reactions   Penicillins Anaphylaxis and Other (See Comments)    Has patient had a PCN reaction causing immediate rash, facial/tongue/throat swelling, SOB or lightheadedness with hypotension: Yes Has patient had a PCN reaction causing severe rash involving mucus membranes or skin necrosis: Yes Has patient had a PCN reaction that required hospitalization: No Has patient had a PCN reaction occurring within the last 10 years: No If all of the above answers are "NO", then may proceed with Cephalosporin use.    Vancomycin Anaphylaxis   Ativan [Lorazepam] Other (See Comments)    Violent and Mean     Current Outpatient Medications  Medication Sig Dispense Refill   acetaminophen (TYLENOL) 650 MG  CR tablet Take 1,300 mg by mouth every 8 (eight) hours as needed for pain.     albuterol (VENTOLIN HFA) 108 (90 Base) MCG/ACT inhaler Inhale 1-2 puffs into the lungs every 6 (six) hours as needed for wheezing or shortness of breath. 18 g 0   aspirin EC 81 MG EC tablet Take 1 tablet (81 mg total) by mouth daily at 6 (six) AM. Swallow whole. 30 tablet 11   clonazePAM (KLONOPIN) 0.5 MG tablet Take 0.5 mg by mouth 2 (two) times daily.      cyclobenzaprine (FLEXERIL) 10 MG tablet Take 10 mg by mouth 3 (three) times daily as needed.     GLIPIZIDE XL 10 MG 24 hr tablet Take 10 mg by mouth 2 (two) times daily.     JARDIANCE 25 MG TABS tablet Take 25 mg by mouth daily.     lisinopril (ZESTRIL) 5 MG tablet Take 1 tablet (5 mg total) by mouth daily. 90 tablet 1   lovastatin (MEVACOR) 40 MG tablet Take 40 mg by mouth at bedtime.      Melatonin 10 MG TABS Take 40 mg by mouth at bedtime.     metFORMIN (GLUCOPHAGE) 1000 MG tablet Take 1,000 mg by mouth 2 (two) times daily.      oxyCODONE 10 MG TABS Take 1 tablet (10 mg total) by mouth every 6 (six) hours as needed for breakthrough pain. 28 tablet 0   pioglitazone (ACTOS) 45 MG tablet Take 45 mg by mouth  daily.     No current facility-administered medications for this visit.    Family History  Problem Relation Age of Onset   Prostate cancer Other        all male members on mother's side   Heart attack Father    COPD Maternal Grandmother    Cancer Paternal Grandmother    Colon cancer Neg Hx     Social History   Socioeconomic History   Marital status: Married    Spouse name: Not on file   Number of children: 2   Years of education: Not on file   Highest education level: Not on file  Occupational History   Occupation: disabled    Comment: 2005  Tobacco Use   Smoking status: Every Day    Packs/day: 1.00    Years: 39.00    Total pack years: 39.00    Types: Cigarettes    Last attempt to quit: 02/04/2020    Years since quitting: 1.4   Smokeless tobacco: Never  Vaping Use   Vaping Use: Never used  Substance and Sexual Activity   Alcohol use: No    Alcohol/week: 0.0 standard drinks of alcohol   Drug use: No   Sexual activity: Yes    Birth control/protection: None  Other Topics Concern   Not on file  Social History Narrative   Not on file   Social Determinants of Health   Financial Resource Strain: Not on file  Food Insecurity: Not on file  Transportation Needs: Not on file  Physical Activity: Not on file  Stress: Not on file  Social Connections: Not on file  Intimate Partner Violence: Not on file     REVIEW OF SYSTEMS:   '[X]'$  denotes positive finding, '[ ]'$  denotes negative finding Cardiac  Comments:  Chest pain or chest pressure: x   Shortness of breath upon exertion:    Short of breath when lying flat:    Irregular heart rhythm:        Vascular  Pain in calf, thigh, or hip brought on by ambulation: x See HPI  Pain in feet at night that wakes you up from your sleep:     Blood clot in your veins:    Leg swelling:  x       Pulmonary    Oxygen at home:    Productive cough:     Wheezing:         Neurologic    Sudden weakness in arms or legs:      Sudden numbness in arms or legs:     Sudden onset of difficulty speaking or slurred speech:    Temporary loss of vision in one eye:     Problems with dizziness:         Gastrointestinal    Blood in stool:     Vomited blood:         Genitourinary    Burning when urinating:     Blood in urine:        Psychiatric    Major depression:         Hematologic    Bleeding problems:    Problems with blood clotting too easily:        Skin    Rashes or ulcers:        Constitutional    Fever or chills:      PHYSICAL EXAMINATION:  Today's Vitals   07/23/21 1157  BP: 99/65  Pulse: 94  Resp: 20  Temp: 97.7 F (36.5 C)  TempSrc: Temporal  SpO2: 98%  Weight: 212 lb 1.6 oz (96.2 kg)  Height: '6\' 1"'$  (1.854 m)   Body mass index is 27.98 kg/m.   General:  WDWN in NAD; vital signs documented above Gait: Not observed HENT: WNL, normocephalic Pulmonary: normal non-labored breathing , without wheezing Cardiac: regular HR, without carotid bruits Skin: without rashes Vascular Exam/Pulses: Palpable radial pulses bilaterally Right PT is palpable Brisk doppler flow right PT/peroneal and left AT/PT/peroneal Extremities: some mild swelling today of the RLE with rubor; his toe amputation sites have healed.  There are no non healing wounds.  Musculoskeletal: no muscle wasting or atrophy  Neurologic: A&O X 3 Psychiatric:  The pt has Normal affect.   Non-Invasive Vascular Imaging:   ABI  on 07/23/2021: Right:  1.23  Left: 1.23/0.74   - Great toe pressure: 80  Arterial duplex on 07/23/2021: Right Graft #1:  +------------------+--------+--------+---------+--------+                    PSV cm/sStenosisWaveform Comments  +------------------+--------+--------+---------+--------+  Inflow            101             triphasic          +------------------+--------+--------+---------+--------+  Prox Anastomosis  70              triphasic           +------------------+--------+--------+---------+--------+  Proximal Graft    113             triphasic          +------------------+--------+--------+---------+--------+  Mid Graft         67              triphasic          +------------------+--------+--------+---------+--------+  Distal Graft      126             triphasic          +------------------+--------+--------+---------+--------+  Distal Anastomosis89              triphasic          +------------------+--------+--------+---------+--------+  Outflow           70              triphasic          +------------------+--------+--------+---------+--------+   Summary:  Right: Patent right femoral to popliteal bypass graft with no evidence for restenosis.     Previous arterial duplex on 10/14/2020: Right: Patent right fem-pop bypass graft with no evidence of stenosis with triphasic waveforms   ASSESSMENT/PLAN:: 61 y.o. male here for follow up for PAD with hx of right common femoral endarterectomy with profundoplasty and bovine patch with a right common femoral to below-knee pop bypass with saphenous vein on 02/04/2020 for CLI with rest pain by Dr. Carlis Abbott.  At the time of his bypass had advanced ischemia with ABIs that were 0.  He subsequently underwent partial toe amputations of right toes 1 2 and 5 on 02/29/2020 by Dr. Carlis Abbott.  He subsequently underwent sharp debridement of skin, subcu tissue, muscle and tendon of right calf ulcer and application of autologous skin substitute and placement of vac on 04/16/2020 by Dr. Trula Slade.   PAD -pt comes in today with concerns for swelling and increased redness of the right leg. His arterial duplex today and ABI looked good with triphasic waveforms. He does have a palpable right PT pulse. He has rubor of the RLE.  -given he cannot tolerate the compression socks, so I wrapped his right lower leg with 4" ace wraps x 2 to the knee.  I have asked him to do this in the mornings and  take it off at night and continue to elevate his leg, which he has been doing.    Current smoker -discussed with he and his wife the importance of smoking cessation.  I discussed with him that given he is diabetic, his blood vessels are already small and smoking certainly makes this worse and could put him at risk for limb loss, stroke and heart attack.  He and his wife do have a date picked out to quit. Discussed finding a new habit to occupy his time.    -continue asa/statin  -pt will f/u in 3 months to see how he is doing with swelling and leg elevation.   Will get ABI when he returns.  He knows to call sooner if he develops rest pain or non healing wounds.  He and his wife expressed understanding and in agreement with this plan.    Leontine Locket, [name] [phone]  Clinic MD:   Virl Cagey on call MD

## 2021-07-23 ENCOUNTER — Ambulatory Visit (INDEPENDENT_AMBULATORY_CARE_PROVIDER_SITE_OTHER)
Admission: RE | Admit: 2021-07-23 | Discharge: 2021-07-23 | Disposition: A | Discharge status: Home / Self Care | Payer: PPO | Source: Ambulatory Visit | Attending: Vascular Surgery | Admitting: Vascular Surgery

## 2021-07-23 ENCOUNTER — Ambulatory Visit: Payer: PPO | Admitting: Physician Assistant

## 2021-07-23 ENCOUNTER — Encounter: Payer: Self-pay | Admitting: Physician Assistant

## 2021-07-23 ENCOUNTER — Ambulatory Visit (HOSPITAL_COMMUNITY)
Admission: RE | Admit: 2021-07-23 | Discharge: 2021-07-23 | Disposition: A | Discharge status: Home / Self Care | Payer: PPO | Source: Ambulatory Visit | Attending: Vascular Surgery | Admitting: Vascular Surgery

## 2021-07-23 ENCOUNTER — Encounter (HOSPITAL_COMMUNITY): Payer: PPO

## 2021-07-23 VITALS — BP 99/65 | HR 94 | Temp 97.7°F | Resp 20 | Ht 73.0 in | Wt 212.1 lb

## 2021-07-23 DIAGNOSIS — I70229 Atherosclerosis of native arteries of extremities with rest pain, unspecified extremity: Secondary | ICD-10-CM

## 2021-07-23 DIAGNOSIS — F17200 Nicotine dependence, unspecified, uncomplicated: Secondary | ICD-10-CM

## 2021-07-23 DIAGNOSIS — M7989 Other specified soft tissue disorders: Secondary | ICD-10-CM

## 2021-07-23 DIAGNOSIS — I739 Peripheral vascular disease, unspecified: Secondary | ICD-10-CM

## 2021-07-24 ENCOUNTER — Other Ambulatory Visit: Payer: Self-pay

## 2021-07-24 DIAGNOSIS — I739 Peripheral vascular disease, unspecified: Secondary | ICD-10-CM

## 2021-07-27 ENCOUNTER — Other Ambulatory Visit: Payer: Self-pay

## 2021-07-27 DIAGNOSIS — R079 Chest pain, unspecified: Secondary | ICD-10-CM

## 2021-07-28 ENCOUNTER — Encounter (HOSPITAL_COMMUNITY): Payer: Self-pay

## 2021-07-28 ENCOUNTER — Ambulatory Visit (HOSPITAL_COMMUNITY): Payer: PPO

## 2021-07-28 ENCOUNTER — Inpatient Hospital Stay (HOSPITAL_COMMUNITY): Admission: RE | Admit: 2021-07-28 | Payer: PPO | Source: Ambulatory Visit

## 2021-09-10 ENCOUNTER — Telehealth: Payer: Self-pay | Admitting: *Deleted

## 2021-09-10 NOTE — Telephone Encounter (Signed)
Canceled 05/19/21 appointment due to family emergency-not rescheduled yet

## 2021-09-10 NOTE — Telephone Encounter (Signed)
-----   Message from Merlene Laughter, RN sent at 05/14/2021  3:21 PM EDT ----- Regarding: LEXI/05/19/21/MCDOWELL

## 2021-10-06 ENCOUNTER — Other Ambulatory Visit (HOSPITAL_COMMUNITY): Payer: Self-pay | Admitting: Neurological Surgery

## 2021-10-06 DIAGNOSIS — M5412 Radiculopathy, cervical region: Secondary | ICD-10-CM

## 2021-10-13 ENCOUNTER — Ambulatory Visit (HOSPITAL_COMMUNITY)
Admission: RE | Admit: 2021-10-13 | Discharge: 2021-10-13 | Disposition: A | Discharge status: Home / Self Care | Payer: PPO | Source: Ambulatory Visit | Attending: Neurological Surgery | Admitting: Neurological Surgery

## 2021-10-13 ENCOUNTER — Encounter (HOSPITAL_COMMUNITY): Payer: Self-pay

## 2021-10-13 DIAGNOSIS — M5412 Radiculopathy, cervical region: Secondary | ICD-10-CM | POA: Insufficient documentation

## 2021-10-20 ENCOUNTER — Ambulatory Visit (HOSPITAL_COMMUNITY): Payer: PPO

## 2021-10-20 ENCOUNTER — Ambulatory Visit: Payer: PPO | Admitting: Vascular Surgery

## 2021-11-04 ENCOUNTER — Other Ambulatory Visit: Payer: Self-pay | Admitting: Cardiology

## 2021-11-06 ENCOUNTER — Other Ambulatory Visit: Payer: Self-pay | Admitting: Neurological Surgery

## 2021-11-12 NOTE — Progress Notes (Signed)
Surgical Instructions    Your procedure is scheduled on Monday, 11/16/21.  Report to [facility] Main Entrance "A" at 12:20 P.M., then check in with the Admitting office.  Call this number if you have problems the morning of surgery:  [phone]   If you have any questions prior to your surgery date call [phone]: Open Monday-Friday 8am-4pm If you experience any cold or flu symptoms such as cough, fever, chills, shortness of breath, etc. between now and your scheduled surgery, please notify us at the above number     Remember:  Do not eat or drink after midnight the night before your surgery     Take these medicines the morning of surgery with A SIP OF WATER:  clonazePAM (KLONOPIN)  cyclobenzaprine (FLEXERIL  oxyCODONE   IF NEEDED: acetaminophen (TYLENOL)  albuterol (VENTOLIN HFA) inhaler  As of today, STOP taking any Aspirin (unless otherwise instructed by your surgeon) Aleve, Naproxen, Ibuprofen, Motrin, Advil, Goody's, BC's, all herbal medications, fish oil, and all vitamins.  WHAT DO I DO ABOUT MY DIABETES MEDICATION?   Do not take oral diabetes medicines (pills) the morning of surgery.  THE NIGHT BEFORE SURGERY, do not take  GLIPIZIDE XL.  THE MORNING OF SURGERY, do not take GLIPIZIDE XL, metFORMIN (GLUCOPHAGE), or pioglitazone (ACTOS).  Take 50% (half of your normal dose) of LEVEMIR FLEXPEN if needed as prescribed.  The day of surgery, do not take other diabetes injectables, including Byetta (exenatide), Bydureon (exenatide ER), Victoza (liraglutide), or Trulicity (dulaglutide).  If your CBG is greater than 220 mg/dL, you may take  of your sliding scale (correction) dose of insulin.   HOW TO MANAGE YOUR DIABETES BEFORE AND AFTER SURGERY  Why is it important to control my blood sugar before and after surgery? Improving blood sugar levels before and after surgery helps healing and can limit problems. A way of improving blood sugar control is eating a healthy  diet by:  Eating less sugar and carbohydrates  Increasing activity/exercise  Talking with your doctor about reaching your blood sugar goals High blood sugars (greater than 180 mg/dL) can raise your risk of infections and slow your recovery, so you will need to focus on controlling your diabetes during the weeks before surgery. Make sure that the doctor who takes care of your diabetes knows about your planned surgery including the date and location.  How do I manage my blood sugar before surgery? Check your blood sugar at least 4 times a day, starting 2 days before surgery, to make sure that the level is not too high or low.  Check your blood sugar the morning of your surgery when you wake up and every 2 hours until you get to the Short Stay unit.  If your blood sugar is less than 70 mg/dL, you will need to treat for low blood sugar: Do not take insulin. Treat a low blood sugar (less than 70 mg/dL) with  cup of clear juice (cranberry or apple), 4 glucose tablets, OR glucose gel. Recheck blood sugar in 15 minutes after treatment (to make sure it is greater than 70 mg/dL). If your blood sugar is not greater than 70 mg/dL on recheck, call [phone] for further instructions. Report your blood sugar to the short stay nurse when you get to Short Stay.  If you are admitted to the hospital after surgery: Your blood sugar will be checked by the staff and you will probably be given insulin after surgery (instead of oral diabetes medicines) to make sure  you have good blood sugar levels. The goal for blood sugar control after surgery is 80-180 mg/dL.      Do not wear jewelry or makeup. Do not wear lotions, powders, cologne or deodorant. Men may shave face and neck. Do not bring valuables to the hospital. Do not wear nail polish, gel polish, artificial nails, or any other type of covering on natural nails (fingers and toes) If you have artificial nails or gel coating that need to be removed by a  nail salon, please have this removed prior to surgery. Artificial nails or gel coating may interfere with anesthesia's ability to adequately monitor your vital signs.  [facility] is not responsible for any belongings or valuables.    Do NOT Smoke (Tobacco/Vaping)  24 hours prior to your procedure  If you use a CPAP at night, you may bring your mask for your overnight stay.   Contacts, glasses, hearing aids, dentures or partials may not be worn into surgery, please bring cases for these belongings   For patients admitted to the hospital, discharge time will be determined by your treatment team.   Patients discharged the day of surgery will not be allowed to drive home, and someone needs to stay with them for 24 hours.   SURGICAL WAITING ROOM VISITATION Patients having surgery or a procedure may have no more than 2 support people in the waiting area - these visitors may rotate.   Children under the age of 66 must have an adult with them who is not the patient. If the patient needs to stay at the hospital during part of their recovery, the visitor guidelines for inpatient rooms apply. Pre-op nurse will coordinate an appropriate time for 1 support person to accompany patient in pre-op.  This support person may not rotate.   Please refer to [email or web address] for the visitor guidelines for Inpatients (after your surgery is over and you are in a regular room).    Special instructions:    Oral Hygiene is also important to reduce your risk of infection.  Remember - BRUSH YOUR TEETH THE MORNING OF SURGERY WITH YOUR REGULAR TOOTHPASTE   Christmas- Preparing For Surgery  Before surgery, you can play an important role. Because skin is not sterile, your skin needs to be as free of germs as possible. You can reduce the number of germs on your skin by washing with CHG (chlorahexidine gluconate) Soap before surgery.  CHG is an antiseptic cleaner  which kills germs and bonds with the skin to continue killing germs even after washing.     Please do not use if you have an allergy to CHG or antibacterial soaps. If your skin becomes reddened/irritated stop using the CHG.  Do not shave (including legs and underarms) for at least 48 hours prior to first CHG shower. It is OK to shave your face.  Please follow these instructions carefully.     Shower the NIGHT BEFORE SURGERY and the MORNING OF SURGERY with CHG Soap.   If you chose to wash your hair, wash your hair first as usual with your normal shampoo. After you shampoo, rinse your hair and body thoroughly to remove the shampoo.  Then [employer] and genitals (private parts) with your normal soap and rinse thoroughly to remove soap.  After that Use CHG Soap as you would any other liquid soap. You can apply CHG directly to the skin and wash gently with a scrungie or a clean washcloth.   Apply the CHG Soap  to your body ONLY FROM THE NECK DOWN.  Do not use on open wounds or open sores. Avoid contact with your eyes, ears, mouth and genitals (private parts). Wash Face and genitals (private parts)  with your normal soap.   Wash thoroughly, paying special attention to the area where your surgery will be performed.  Thoroughly rinse your body with warm water from the neck down.  DO NOT shower/wash with your normal soap after using and rinsing off the CHG Soap.  Pat yourself dry with a CLEAN TOWEL.  Wear CLEAN PAJAMAS to bed the night before surgery  Place CLEAN SHEETS on your bed the night before your surgery  DO NOT SLEEP WITH PETS.   Day of Surgery: Take a shower with CHG soap. Wear Clean/Comfortable clothing the morning of surgery Do not apply any deodorants/lotions.   Remember to brush your teeth WITH YOUR REGULAR TOOTHPASTE.    If you received a COVID test during your pre-op visit, it is requested that you wear a mask when out in public, stay away from anyone that may not be  feeling well, and notify your surgeon if you develop symptoms. If you have been in contact with anyone that has tested positive in the last 10 days, please notify your surgeon.    Please read over the following fact sheets that you were given.

## 2021-11-13 ENCOUNTER — Telehealth: Payer: Self-pay

## 2021-11-13 ENCOUNTER — Encounter (HOSPITAL_COMMUNITY)
Admission: RE | Admit: 2021-11-13 | Discharge: 2021-11-13 | Disposition: A | Discharge status: Home / Self Care | Payer: PPO | Source: Ambulatory Visit | Attending: Neurological Surgery | Admitting: Neurological Surgery

## 2021-11-13 ENCOUNTER — Encounter (HOSPITAL_COMMUNITY): Payer: Self-pay

## 2021-11-13 ENCOUNTER — Other Ambulatory Visit: Payer: Self-pay

## 2021-11-13 VITALS — BP 121/82 | HR 102 | Temp 98.0°F | Resp 17 | Ht 73.0 in | Wt 215.8 lb

## 2021-11-13 DIAGNOSIS — F419 Anxiety disorder, unspecified: Secondary | ICD-10-CM | POA: Insufficient documentation

## 2021-11-13 DIAGNOSIS — Z01818 Encounter for other preprocedural examination: Secondary | ICD-10-CM | POA: Insufficient documentation

## 2021-11-13 DIAGNOSIS — G4730 Sleep apnea, unspecified: Secondary | ICD-10-CM | POA: Insufficient documentation

## 2021-11-13 DIAGNOSIS — Z8719 Personal history of other diseases of the digestive system: Secondary | ICD-10-CM | POA: Diagnosis not present

## 2021-11-13 DIAGNOSIS — J45909 Unspecified asthma, uncomplicated: Secondary | ICD-10-CM | POA: Diagnosis not present

## 2021-11-13 DIAGNOSIS — E1151 Type 2 diabetes mellitus with diabetic peripheral angiopathy without gangrene: Secondary | ICD-10-CM | POA: Insufficient documentation

## 2021-11-13 DIAGNOSIS — Z85828 Personal history of other malignant neoplasm of skin: Secondary | ICD-10-CM | POA: Diagnosis not present

## 2021-11-13 DIAGNOSIS — E782 Mixed hyperlipidemia: Secondary | ICD-10-CM | POA: Insufficient documentation

## 2021-11-13 DIAGNOSIS — I7121 Aneurysm of the ascending aorta, without rupture: Secondary | ICD-10-CM | POA: Diagnosis not present

## 2021-11-13 DIAGNOSIS — I700 Atherosclerosis of aorta: Secondary | ICD-10-CM | POA: Diagnosis not present

## 2021-11-13 DIAGNOSIS — E1159 Type 2 diabetes mellitus with other circulatory complications: Secondary | ICD-10-CM | POA: Diagnosis present

## 2021-11-13 DIAGNOSIS — I10 Essential (primary) hypertension: Secondary | ICD-10-CM | POA: Insufficient documentation

## 2021-11-13 LAB — SURGICAL PCR SCREEN
MRSA, PCR: NEGATIVE
Staphylococcus aureus: NEGATIVE

## 2021-11-13 LAB — BASIC METABOLIC PANEL
Anion gap: 10 (ref 5–15)
BUN: 15 mg/dL (ref 8–23)
CO2: 26 mmol/L (ref 22–32)
Calcium: 9.6 mg/dL (ref 8.9–10.3)
Chloride: 102 mmol/L (ref 98–111)
Creatinine, Ser: 0.92 mg/dL (ref 0.61–1.24)
GFR, Estimated: >60 mL/min (ref >60)
Glucose, Bld: 169 mg/dL — ABNORMAL HIGH (ref 70–99)
Potassium: 4.3 mmol/L (ref 3.5–5.1)
Sodium: 138 mmol/L (ref 135–145)

## 2021-11-13 LAB — CBC
HCT: 37.5 % — ABNORMAL LOW (ref 39.0–52.0)
Hemoglobin: 12.2 g/dL — ABNORMAL LOW (ref 13.0–17.0)
MCH: 30.5 pg (ref 26.0–34.0)
MCHC: 32.5 g/dL (ref 30.0–36.0)
MCV: 93.8 fL (ref 80.0–100.0)
Platelets: 310 10*3/uL (ref 150–400)
RBC: 4 MIL/uL — ABNORMAL LOW (ref 4.22–5.81)
RDW: 14.2 % (ref 11.5–15.5)
WBC: 8.4 10*3/uL (ref 4.0–10.5)
nRBC: 0 % (ref 0.0–0.2)

## 2021-11-13 LAB — PROTIME-INR
INR: 1 (ref 0.8–1.2)
Prothrombin Time: 12.5 seconds (ref 11.4–15.2)

## 2021-11-13 LAB — HEMOGLOBIN A1C
Hgb A1c MFr Bld: 8.3 % — ABNORMAL HIGH (ref 4.8–5.6)
Mean Plasma Glucose: 191.51 mg/dL

## 2021-11-13 LAB — GLUCOSE, CAPILLARY: Glucose-Capillary: 169 mg/dL — ABNORMAL HIGH (ref 70–99)

## 2021-11-13 NOTE — Progress Notes (Addendum)
Anesthesia PAT Evaluation:  Case: [id number] Date/Time: 11/16/21 1405   Procedure: ACDF - C4-C5 Globus , removal plate C5-6 - 3C   Anesthesia type: General   Pre-op diagnosis: Stenosis   Location: [facility] OR ROOM 68 / [place] OR   Surgeons: Eustace Moore, MD       DISCUSSION: Patient is a 61 year old man scheduled for the above procedure.  History includes smoking (a little over 1 PPD), HTN, DM2, HLD, PAD (right CFA endarterectomy with profundoplasty, right CFA-(below knee) popliteal artery bypass using right GSV graft 02/04/20; 02/04/20, right partial toe amputations 1/2/4 02/29/20; I&D calf wound 04/16/20), anemia, asthma, migraines, spinal surgery (spinal cored stimulator), PE (6//7/22, treated with several months of Eliquis), ascending thoracic aortic dilation (4 cm 04/27/21 CTA).   I evaluated Wayne Pratt during his PAT visit because he had seen cardiologist Rozann Lesches, MD on 5/4/023 following ED visit for chest pain. Description of pain was "largely atypical", but he had had recurrent thoracic discomfort and had multiple risk factors includes smoking, DM2, HLD, HTN, PAD. He had coronary atherosclerosis on CT imaging, but otherwise no recent ischemic evaluation. Echo in June 2022 showed LVEF 50-55%, no regional wall motion abnormalities, mild LVH, grade 1 diastolic dysfunction, normal RVSF, trivial MR, mild AV sclerosis without stenosis. Dr. Domenic Polite ordered a Lexiscan Myoview (CCTA avoided due to high resting HR); however, the test was cancelled due to family emergency and never rescheduled. He reported that his PCP did not think he needed one.   I spoke with Wayne Pratt and his wife. He insists that his chest pain was in the setting of PE in which he was admitted to [facility]. I told him that I did not see any admissions since his saw Dr. Domenic Polite in May. (He was admitted for bilateral PE on 06/17/20.) He continues to deny chest pain, SOB, edema. He says that he was able to do whatever he wanted until he injured his  neck 2-3 months ago. (A tree branch hit him while he was riding his lawnmower.) He was able to use his weed eater and leaf blower and walk a 1/2 mile to his deer stand. He does smoke a little over a pack per day. Denied alcohol use. He says his home CBGs are < 200. His A1c on 11/13/21 was 8.3%. He says main issue now is severe neck pain since his injury and has LUE numbness with mild weakness/clumsiness. He denied family history of CAD.   He does not report CV symptoms, although remains with several CAD risk factors. He is having severe neck pain following his neck injury a few months ago and said he was active prior to that.  I am unsure of the urgency of this surgery. Discussed that since a cardiologist had recommended a stress test, then we would need preoperative cardiology input to determine if testing required before surgery. Should Dr. Ronnald Ramp indicate surgery is urgent or emergent then his input would be considered as well. Lorriane Shire at Dr. Ronnald Ramp' office to reach out to [facility]. I've told her to reference my note and contact me for questions if needed. I've also reviewed with anesthesiologist Tamela Gammon, MD.   ADDENDUM 11/13/21 5:44 PM: Per communication with Diona Browner, NP, Dr. Domenic Polite cannot clear him for surgery on Monday without being re-evaluated and determining if additional cardiac testing is warranted. He has an appointment at [facility] on 11/16/21 at 10:55 AM with Nicholes Rough, PA-C. Currently, surgery remains scheduled for 10/16/21 at 2:05 PM, but  it will depend on cardiology recommendations.   ADDENDUM 11/16/21 11:54 AM: Preoperative risk assessment outlined this morning by Lauro Franklin, PA-C following office evaluation, "Wayne Pratt's perioperative risk of a major cardiac event is 6.6% according to the Revised Cardiac Risk Index (RCRI).  Therefore, he is at high risk for perioperative complications.   His functional capacity is good at 5.62 METs according to the [facility]  Activity Status Index (DASI). Recommendations: According to ACC/AHA guidelines, no further cardiovascular testing needed.  The patient may proceed to surgery at acceptable risk.   Antiplatelet and/or Anticoagulation Recommendations: Aspirin can be held for 7 days prior to his surgery.  Please resume Aspirin post operatively when it is felt to be safe from a bleeding standpoint."    VS: BP 121/82   Pulse (!) 102   Temp 36.7 C   Resp 17   Ht '6\' 1"'$  (1.854 m)   Wt 97.9 kg   SpO2 100%   BMI 28.47 kg/m  Heart RRR, no murmur heard. Lungs faintly coarse on inspiration at posterior bases initially but cleared with deep breathing. He is able to ambulate independently.    PROVIDERS: Leeanne Rio, MD Monica Martinez, MD is vascular surgeon Rozann Lesches, MD is cardiologist   LABS: Preoperative labs noted.  (all labs ordered are listed, but only abnormal results are displayed)  Labs Reviewed  GLUCOSE, CAPILLARY - Abnormal; Notable for the following components:      Result Value   Glucose-Capillary 169 (*)    All other components within normal limits  HEMOGLOBIN A1C - Abnormal; Notable for the following components:   Hgb A1c MFr Bld 8.3 (*)    All other components within normal limits  BASIC METABOLIC PANEL - Abnormal; Notable for the following components:   Glucose, Bld 169 (*)    All other components within normal limits  CBC - Abnormal; Notable for the following components:   RBC 4.00 (*)    Hemoglobin 12.2 (*)    HCT 37.5 (*)    All other components within normal limits  SURGICAL PCR SCREEN  PROTIME-INR    IMAGES: MRI C-spine 10/13/21: IMPRESSION: 1. Status post C5-7 ACDF with solid interbody fusion and no residual spinal stenosis or nerve root encroachment. 2. Mildly progressive adjacent segment disease at C4-5 with resulting mild ventral cord flattening and moderate to severe foraminal narrowing bilaterally which could affect either C5 nerve root. 3. Mildly  progressive left foraminal narrowing at C3-4 due to uncinate spurring. 4. Stable moderate left foraminal narrowing at C2-3 due to uncinate spurring. 5. No acute findings or abnormal cord signal.  CTA Chest 04/27/21: IMPRESSION: 1. No evidence of pulmonary embolism to the level of the distal lobar pulmonary arteries. 2. Similar aneurysmal dilation of the ascending aorta measuring 4 cm. Recommend annual imaging followup by CTA or MRA. This recommendation follows 2010 ACCF/AHA/AATS/ACR/ASA/SCA/SCAI/SIR/STS/SVM Guidelines for the Diagnosis and Management of Patients with Thoracic Aortic Disease. Circulation. 2010; 121: P382-N053. Aortic aneurysm NOS (ICD10-I71.9) 3. Mild diffuse bronchial wall thickening, which can be seen with acute or chronic bronchitis. 4. Aortic Atherosclerosis (ICD10-I70.0).    EKG: 05/14/21: ST at 101 bpm   CV: Echo 06/18/20: IMPRESSIONS   1. Left ventricular ejection fraction, by estimation, is 50 to 55%. The  left ventricle has low normal function. The left ventricle has no regional  wall motion abnormalities. The left ventricular internal cavity size was  mildly dilated. There is mild  left ventricular hypertrophy. Left ventricular diastolic parameters are  consistent with  Grade I diastolic dysfunction (impaired relaxation).   2. Right ventricular systolic function is normal. The right ventricular  size is normal.   3. The mitral valve is normal in structure. Trivial mitral valve  regurgitation.   4. Aortic valve regurgitation is not visualized. Mild aortic valve  sclerosis is present, with no evidence of aortic valve stenosis.  - Comparison(s): The left ventricular function is unchanged when compared to  echo from 2021.   Past Medical History:  Diagnosis Date   Anemia    Anxiety    Arthritis    Asthma    Cervical disc disease    Essential hypertension    History of kidney stones    History of pleural empyema    History of pneumonia    History of  skin cancer    Hypertension    Insomnia    Migraine    Mixed hyperlipidemia    Nephrolithiasis    Neuropathy    PAD (peripheral artery disease) (HCC)    Sleep apnea    Stop Bang score of 7   Type 2 diabetes mellitus ([place])     Past Surgical History:  Procedure Laterality Date   ABDOMINAL AORTOGRAM W/LOWER EXTREMITY N/A 01/31/2020   Procedure: ABDOMINAL AORTOGRAM W/LOWER EXTREMITY;  Surgeon: Marty Heck, MD;  Location: [place] [facility];  Service: Cardiovascular;  Laterality: N/A;   AMPUTATION Right 02/29/2020   Procedure: RIGHT TOE AMPUTATIONS, 1, 2, 5;  Surgeon: Marty Heck, MD;  Location: [place];  Service: Vascular;  Laterality: Right;   APPLICATION OF WOUND VAC Right 02/29/2020   Procedure: APPLICATION OF WOUND VAC;  Surgeon: Marty Heck, MD;  Location: [place];  Service: Vascular;  Laterality: Right;   APPLICATION OF WOUND VAC Right 04/16/2020   Procedure: APPLICATION OF WOUND VAC;  Surgeon: Serafina Mitchell, MD;  Location: [place];  Service: Vascular;  Laterality: Right;   BIOPSY  10/05/2011   Dr. Oneida Alar :non-erosive gastritis   BIOPSY  08/06/2014   Procedure: BIOPSY;  Surgeon: Danie Binder, MD;  Location: [facility];  Service: Endoscopy;;   CERVICAL DISC SURGERY     ELBOW SURGERY Right    ENDARTERECTOMY FEMORAL Right 02/04/2020   Procedure: RIGHT FEMORAL ENDARTERECTOMY with Profundaplasty;  Surgeon: Marty Heck, MD;  Location: [place];  Service: Vascular;  Laterality: Right;   ESOPHAGOGASTRODUODENOSCOPY  09/2011   Dr. Oneida Alar: chronic inactive gastritis    ESOPHAGOGASTRODUODENOSCOPY N/A 08/18/2017   Procedure: ESOPHAGOGASTRODUODENOSCOPY (EGD);  Surgeon: Milus Banister, MD;  [facility];  Service: Endoscopy;  Laterality: N/A;   ESOPHAGOGASTRODUODENOSCOPY N/A 03/09/2018   Procedure: ESOPHAGOGASTRODUODENOSCOPY (EGD);  Surgeon: Milus Banister, MD;  Location[facility];  Service: Endoscopy;  Laterality: N/A;    ESOPHAGOGASTRODUODENOSCOPY (EGD) WITH PROPOFOL N/A 08/06/2014   mild non-erosive gastritis, duodenitis   EUS N/A 08/18/2017   Procedure: UPPER ENDOSCOPIC ULTRASOUND (EUS) RADIAL;  Surgeon: Milus Banister, MD;  Location: [facility];  Service: Endoscopy;  Laterality: N/A;   EUS N/A 03/09/2018   Procedure: UPPER ENDOSCOPIC ULTRASOUND (EUS) RADIAL;  Surgeon: Milus Banister, MD;  Location: [facility];  Service: Endoscopy;  Laterality: N/A;   EYE SURGERY Right    fixed tear in the cornea with laser   FEMORAL-POPLITEAL BYPASS GRAFT Right 02/04/2020   Procedure: BYPASS GRAFT FEMORAL-POPLITEAL ARTERY With Vein Harvest;  Surgeon: Marty Heck, MD;  Location: [place];  Service: Vascular;  Laterality: Right;   FINE NEEDLE ASPIRATION  03/09/2018   Procedure: FINE  NEEDLE ASPIRATION;  Surgeon: Milus Banister, MD;  Location: [facility];  Service: Endoscopy;;   FLEXIBLE SIGMOIDOSCOPY  10/05/2011   Dr. Fields:poor prep   I & D EXTREMITY Right 04/16/2020   Procedure: RIGHT CALF WOUND IRRIGATION, DEBRIDEMENT AND GRAFTING;  Surgeon: Serafina Mitchell, MD;  Location: [place];  Service: Vascular;  Laterality: Right;   LUNG SURGERY  2010   for empyema   PATCH ANGIOPLASTY Right 02/04/2020   Procedure: PATCH ANGIOPLASTY;  Surgeon: Marty Heck, MD;  Location: [place];  Service: Vascular;  Laterality: Right;   SPINAL CORD STIMULATOR IMPLANT  08/29/2019   [employer] wavewriter alpha [phone]   SPINAL CORD STIMULATOR INSERTION N/A 08/01/2015   Procedure: Spinal Cord Stimulator Implant;  Surgeon: Eustace Moore, MD;  [facility];  Service: Neurosurgery;  Laterality: N/A;   TONSILLECTOMY     VASECTOMY Bilateral 1995   VEIN HARVEST Right 02/04/2020   Procedure: VEIN HARVEST Greater Saphenous Vein;  Surgeon: Marty Heck, MD;  Location[facility];  Service: Vascular;  Laterality: Right;   WOUND DEBRIDEMENT Right 02/29/2020   Procedure: RIGHT CALF DEBRIDEMENT;  Surgeon:  Marty Heck, MD;  [facility];  Service: Vascular;  Laterality: Right;    MEDICATIONS:  acetaminophen (TYLENOL) 650 MG CR tablet   albuterol (VENTOLIN HFA) 108 (90 Base) MCG/ACT inhaler   aspirin EC 81 MG EC tablet   clonazePAM (KLONOPIN) 0.5 MG tablet   cyclobenzaprine (FLEXERIL) 10 MG tablet   GLIPIZIDE XL 10 MG 24 hr tablet   LEVEMIR FLEXPEN 100 UNIT/ML FlexPen   lisinopril (ZESTRIL) 5 MG tablet   lovastatin (MEVACOR) 40 MG tablet   Melatonin 10 MG TABS   metFORMIN (GLUCOPHAGE) 1000 MG tablet   oxyCODONE 10 MG TABS   pioglitazone (ACTOS) 45 MG tablet   No current facility-administered medications for this encounter.    Myra Gianotti, PA-C Surgical Short Stay/Anesthesiology [facility] Phone [phone] [facility] Phone [phone] 11/13/2021 3:50 PM

## 2021-11-13 NOTE — Telephone Encounter (Signed)
Pt is scheduled to see Nicholes Rough, PA-C 11/16/21 for clearance.

## 2021-11-13 NOTE — Progress Notes (Signed)
Wayne Pratt. PA-C came to speak with patient after PAT appointment to discuss recommended stress test by Dr. Domenic Polite. Wayne Pratt was also notified of elevated A1C level.

## 2021-11-13 NOTE — Progress Notes (Signed)
PCP - [name] Cardiologist - Rozann Lesches   PPM/ICD - denies  Chest x-ray - n/a EKG - 05/14/21 Stress Test - denies ECHO - 06/18/20 Cardiac Cath - denies  Sleep Study - denies   Fasting Blood Sugar - 90-140 Checks Blood Sugar 3 times a day  As of today, STOP taking any Aspirin (unless otherwise instructed by your surgeon) Aleve, Naproxen, Ibuprofen, Motrin, Advil, Goody's, BC's, all herbal medications, fish oil, and all vitamins.   ERAS Protcol -no   COVID TEST- not needed   Anesthesia review: yes, allison z called to review chart while patient was in pat appt  Patient denies shortness of breath, fever, cough and chest pain at PAT appointment   All instructions explained to the patient, with a verbal understanding of the material. Patient agrees to go over the instructions while at home for a better understanding. Patient also instructed to self quarantine after being tested for COVID-19. The opportunity to ask questions was provided.

## 2021-11-13 NOTE — Anesthesia Preprocedure Evaluation (Signed)
Anesthesia Evaluation  Patient identified by MRN, date of birth, ID band Patient awake    Reviewed: Allergy & Precautions, NPO status , Patient's Chart, lab work & pertinent test results  Airway Mallampati: II  TM Distance: >3 FB Neck ROM: Full    Dental  (+) Edentulous Upper, Edentulous Lower   Pulmonary asthma , sleep apnea , Current Smoker    + decreased breath sounds      Cardiovascular hypertension, Pt. on medications + Peripheral Vascular Disease   Rhythm:Regular Rate:Normal  Echo: 1. Left ventricular ejection fraction, by estimation, is 50 to 55%. The  left ventricle has low normal function. The left ventricle has no regional  wall motion abnormalities. The left ventricular internal cavity size was  mildly dilated. There is mild  left ventricular hypertrophy. Left ventricular diastolic parameters are  consistent with Grade I diastolic dysfunction (impaired relaxation).   2. Right ventricular systolic function is normal. The right ventricular  size is normal.   3. The mitral valve is normal in structure. Trivial mitral valve  regurgitation.   4. Aortic valve regurgitation is not visualized. Mild aortic valve  sclerosis is present, with no evidence of aortic valve stenosis.     Neuro/Psych  Headaches  Anxiety        GI/Hepatic negative GI ROS, Neg liver ROS,,,  Endo/Other  diabetes, Type 2, Oral Hypoglycemic Agents    Renal/GU Renal disease     Musculoskeletal  (+) Arthritis ,    Abdominal   Peds  Hematology   Anesthesia Other Findings   Reproductive/Obstetrics                             Anesthesia Physical Anesthesia Plan  ASA: 3  Anesthesia Plan: General   Post-op Pain Management:    Induction: Intravenous  PONV Risk Score and Plan: 2 and Ondansetron, Midazolam, Dexamethasone and Treatment may vary due to age or medical condition  Airway Management Planned: Oral ETT  and Video Laryngoscope Planned  Additional Equipment: None  Intra-op Plan:   Post-operative Plan: Extubation in OR  Informed Consent: I have reviewed the patients History and Physical, chart, labs and discussed the procedure including the risks, benefits and alternatives for the proposed anesthesia with the patient or authorized representative who has indicated his/her understanding and acceptance.     Dental advisory given  Plan Discussed with: CRNA  Anesthesia Plan Comments: (See PAT note written 11/13/2021 by Myra Gianotti, PA-C. He has cardiology evaluation 11/16/21 at 10:55 AM [facility] on [place].   )       Anesthesia Quick Evaluation

## 2021-11-13 NOTE — Telephone Encounter (Signed)
   Pre-operative Risk Assessment    Patient Name: Wayne Pratt  DOB: 10-Jun-1960 MRN: [id number]     Request for Surgical Clearance    Procedure:  C4-5 Anterior cervical fusion, removal of hardware   Date of Surgery:  Clearance 11/16/21                                 Surgeon:  Dr. Sherley Bounds  Surgeon's Group or Practice Name:  [facility]  Phone number:  [phone] Fax number:  [phone]   Type of Clearance Requested:   - Medical    Type of Anesthesia:  General    Additional requests/questions:  Will need cardiology to sign off clearance without pt having stress test before he can move forward with his surgery scheduled for Monday   Signed, Mendel Ryder   11/13/2021, 3:57 PM

## 2021-11-13 NOTE — Telephone Encounter (Signed)
   Name: XZAVIAR MALOOF  DOB: 02/08/1960  MRN: [id number]  Primary Cardiologist: Donato Heinz, MD  Chart reviewed as part of pre-operative protocol coverage. Because of Garvey Westcott Detamore's past medical history and time since last visit, he will require a follow-up in-office visit in order to better assess preoperative cardiovascular risk.  Per Dr. Domenic Polite: "I cannot clear him for surgery on Monday. I saw him once earlier this year and recommended a Myoview at that time for ischemic evaluation and this was not completed per chart review. He will need to be seen for follow up in the office (would schedule with APP ASAP) to discuss symptoms and arrange appropriate testing"  Pre-op covering staff: - Please schedule appointment and call patient to inform them. If patient already had an upcoming appointment within acceptable timeframe, please add "pre-op clearance" to the appointment notes so provider is aware. - Please contact requesting surgeon's office via preferred method (i.e, phone, fax) to inform them of need for appointment prior to surgery.  Lenna Sciara, NP  11/13/2021, 4:46 PM

## 2021-11-15 NOTE — Progress Notes (Unsigned)
Office Visit    Patient Name: Wayne Pratt Date of Encounter: 11/16/2021  PCP:  Leeanne Rio, [place]  Cardiologist:  Donato Heinz, MD  Advanced Practice Provider:  No care team member to display Electrophysiologist:  None   HPI    Wayne Pratt is a 61 y.o. male with a past medical history significant for anxiety, hypertension, hyperlipidemia, PAD, type 2 diabetes mellitus, sleep apnea presents today for preop evaluation.   He was seen by Dr. Nechama Guard 05/2021 and at that time was recently in the ER for shortness of breath, back pain, and chest discomfort.  D-dimer and high-sensitivity troponin levels were normal.  Underwent chest CTA that showed no evidence of pulmonary embolus.  Aneurysmal dilatation of the ascending aorta was stable at 4 cm.  Coronary and aortic atherosclerosis was incidentally noted and had been seen on prior CT imaging.  His symptoms were discussed.  Blood pressure was elevated as high as 160s over 90s.  The plan at that time was to avoid coronary CTA given high resting heart rate and plan for [facility].  This was never completed.  Today, he tells me all of his previous symptoms were due to a blood clot in his lung.  All pain went away after this was treated with blood thinners.  He currently is only on aspirin 81 mg which he has been holding for the past week due to upcoming neck surgery which is today.  Also his A1c was over 10 and is down to 8 due to some new medications that were added by his primary.  He also has lost some weight due to this.  He tells me he was mowing his yard and ended up breaking the plate that was at his neck.  2 nerves are being impinged and he is in a lot of pain today.  Blood pressure and heart rate are well controlled.  Heart rate is a little high but could be due to pain.  Tolerating all medications without issue.  No further cardiac symptoms.  He has some seasonal allergies that cause a little  shortness of breath but this has been stable.  Reports no shortness of breath nor dyspnea on exertion. Reports no chest pain, pressure, or tightness. No edema, orthopnea, PND. Reports no palpitations.   Past Medical History    Past Medical History:  Diagnosis Date   Anemia    Anxiety    Arthritis    Asthma    Cervical disc disease    Essential hypertension    History of kidney stones    History of pleural empyema    History of pneumonia    History of skin cancer    Hypertension    Insomnia    Migraine    Mixed hyperlipidemia    Nephrolithiasis    Neuropathy    PAD (peripheral artery disease) (HCC)    Sleep apnea    Stop Bang score of 7   Type 2 diabetes mellitus ([place])    Past Surgical History:  Procedure Laterality Date   ABDOMINAL AORTOGRAM W/LOWER EXTREMITY N/A 01/31/2020   Procedure: ABDOMINAL AORTOGRAM W/LOWER EXTREMITY;  Surgeon: Marty Heck, MD;  Location: [facility];  Service: Cardiovascular;  Laterality: N/A;   AMPUTATION Right 02/29/2020   Procedure: RIGHT TOE AMPUTATIONS, 1, 2, 5;  Surgeon: Marty Heck, MD;  Location: [place];  Service: Vascular;  Laterality: Right;   APPLICATION OF  WOUND VAC Right 02/29/2020   Procedure: APPLICATION OF WOUND VAC;  Surgeon: Marty Heck, MD;  Location: [facility];  Service: Vascular;  Laterality: Right;   APPLICATION OF WOUND VAC Right 04/16/2020   Procedure: APPLICATION OF WOUND VAC;  Surgeon: Serafina Mitchell, MD;  Location: [place];  Service: Vascular;  Laterality: Right;   BIOPSY  10/05/2011   Dr. Oneida Alar :non-erosive gastritis   BIOPSY  08/06/2014   Procedure: BIOPSY;  Surgeon: Danie Binder, MD;  Location: [facility];  Service: Endoscopy;;   CERVICAL DISC SURGERY     ELBOW SURGERY Right    ENDARTERECTOMY FEMORAL Right 02/04/2020   Procedure: RIGHT FEMORAL ENDARTERECTOMY with Profundaplasty;  Surgeon: Marty Heck, MD;  Location: [place];  Service: Vascular;  Laterality: Right;    ESOPHAGOGASTRODUODENOSCOPY  09/2011   Dr. Oneida Alar: chronic inactive gastritis    ESOPHAGOGASTRODUODENOSCOPY N/A 08/18/2017   Procedure: ESOPHAGOGASTRODUODENOSCOPY (EGD);  Surgeon: Milus Banister, MD;  Location: [facility];  Service: Endoscopy;  Laterality: N/A;   ESOPHAGOGASTRODUODENOSCOPY N/A 03/09/2018   Procedure: ESOPHAGOGASTRODUODENOSCOPY (EGD);  Surgeon: Milus Banister, MD;  Location: [facility];  Service: Endoscopy;  Laterality: N/A;   ESOPHAGOGASTRODUODENOSCOPY (EGD) WITH PROPOFOL N/A 08/06/2014   mild non-erosive gastritis, duodenitis   EUS N/A 08/18/2017   Procedure: UPPER ENDOSCOPIC ULTRASOUND (EUS) RADIAL;  Surgeon: Milus Banister, MD;  Location: [facility];  Service: Endoscopy;  Laterality: N/A;   EUS N/A 03/09/2018   Procedure: UPPER ENDOSCOPIC ULTRASOUND (EUS) RADIAL;  Surgeon: Milus Banister, MD;  Location: [facility];  Service: Endoscopy;  Laterality: N/A;   EYE SURGERY Right    fixed tear in the cornea with laser   FEMORAL-POPLITEAL BYPASS GRAFT Right 02/04/2020   Procedure: BYPASS GRAFT FEMORAL-POPLITEAL ARTERY With Vein Harvest;  Surgeon: Marty Heck, MD;  Location: [place];  Service: Vascular;  Laterality: Right;   FINE NEEDLE ASPIRATION  03/09/2018   Procedure: FINE NEEDLE ASPIRATION;  Surgeon: Milus Banister, MD;  Location: [facility];  Service: Endoscopy;;   FLEXIBLE SIGMOIDOSCOPY  10/05/2011   Dr. Fields:poor prep   I & D EXTREMITY Right 04/16/2020   Procedure: RIGHT CALF WOUND IRRIGATION, DEBRIDEMENT AND GRAFTING;  Surgeon: Serafina Mitchell, MD;  Location: [place];  Service: Vascular;  Laterality: Right;   LUNG SURGERY  2010   for empyema   PATCH ANGIOPLASTY Right 02/04/2020   Procedure: PATCH ANGIOPLASTY;  Surgeon: Marty Heck, MD;  Location: [place];  Service: Vascular;  Laterality: Right;   SPINAL CORD STIMULATOR IMPLANT  08/29/2019   [employer] wavewriter alpha [phone]   SPINAL CORD STIMULATOR INSERTION N/A  08/01/2015   Procedure: Spinal Cord Stimulator Implant;  Surgeon: Eustace Moore, MD;  Location: [facility];  Service: Neurosurgery;  Laterality: N/A;   TONSILLECTOMY     VASECTOMY Bilateral 1995   VEIN HARVEST Right 02/04/2020   Procedure: VEIN HARVEST Greater Saphenous Vein;  Surgeon: Marty Heck, MD;  Location: [place];  Service: Vascular;  Laterality: Right;   WOUND DEBRIDEMENT Right 02/29/2020   Procedure: RIGHT CALF DEBRIDEMENT;  Surgeon: Marty Heck, MD;  Location: [place];  Service: Vascular;  Laterality: Right;    Allergies  Allergies  Allergen Reactions   Penicillins Anaphylaxis and Other (See Comments)    Has patient had a PCN reaction causing immediate rash, facial/tongue/throat swelling, SOB or lightheadedness with hypotension: Yes Has patient had a PCN reaction causing severe rash involving mucus membranes or skin necrosis: Yes Has patient had  a PCN reaction that required hospitalization: No Has patient had a PCN reaction occurring within the last 10 years: No If all of the above answers are "NO", then may proceed with Cephalosporin use.    Vancomycin Anaphylaxis   Ativan [Lorazepam] Other (See Comments)    Violent and Mean    Baclofen     EKGs/Labs/Other Studies Reviewed:   The following studies were reviewed today:   Echocardiogram 06/18/2020:  1. Left ventricular ejection fraction, by estimation, is 50 to 55%. The  left ventricle has low normal function. The left ventricle has no regional  wall motion abnormalities. The left ventricular internal cavity size was  mildly dilated. There is mild  left ventricular hypertrophy. Left ventricular diastolic parameters are  consistent with Grade I diastolic dysfunction (impaired relaxation).   2. Right ventricular systolic function is normal. The right ventricular  size is normal.   3. The mitral valve is normal in structure. Trivial mitral valve  regurgitation.   4. Aortic valve regurgitation is not  visualized. Mild aortic valve  sclerosis is present, with no evidence of aortic valve stenosis.    Chest CTA 04/27/2021: IMPRESSION: 1. No evidence of pulmonary embolism to the level of the distal lobar pulmonary arteries. 2. Similar aneurysmal dilation of the ascending aorta measuring 4 cm. Recommend annual imaging followup by CTA or MRA. This recommendation follows 2010 ACCF/AHA/AATS/ACR/ASA/SCA/SCAI/SIR/STS/SVM Guidelines for the Diagnosis and Management of Patients with Thoracic Aortic Disease. Circulation. 2010; 121: S010-X323. Aortic aneurysm NOS (ICD10-I71.9) 3. Mild diffuse bronchial wall thickening, which can be seen with acute or chronic bronchitis. 4. Aortic Atherosclerosis (ICD10-I70.0).    EKG:  EKG is  ordered today.  The ekg ordered today demonstrates NSR rate 90 bpm  Recent Labs: 11/13/2021: BUN 15; Creatinine, Ser 0.92; Hemoglobin 12.2; Platelets 310; Potassium 4.3; Sodium 138  Recent Lipid Panel    Component Value Date/Time   CHOL 133 02/05/2020 0217   TRIG 112 02/05/2020 0217   HDL 37 (L) 02/05/2020 0217   CHOLHDL 3.6 02/05/2020 0217   VLDL 22 02/05/2020 0217   LDLCALC 74 02/05/2020 0217     Home Medications   Current Meds  Medication Sig   acetaminophen (TYLENOL) 650 MG CR tablet Take 1,300 mg by mouth every 8 (eight) hours as needed for pain.   albuterol (VENTOLIN HFA) 108 (90 Base) MCG/ACT inhaler Inhale 1-2 puffs into the lungs every 6 (six) hours as needed for wheezing or shortness of breath.   aspirin EC 81 MG EC tablet Take 1 tablet (81 mg total) by mouth daily at 6 (six) AM. Swallow whole.   clonazePAM (KLONOPIN) 0.5 MG tablet Take 0.5 mg by mouth 2 (two) times daily.    cyclobenzaprine (FLEXERIL) 10 MG tablet Take 10 mg by mouth 3 (three) times daily.   GLIPIZIDE XL 10 MG 24 hr tablet Take 10 mg by mouth 2 (two) times daily.   LEVEMIR FLEXPEN 100 UNIT/ML FlexPen Inject 15 Units into the skin daily as needed (High Blood Sugar).   lisinopril  (ZESTRIL) 5 MG tablet TAKE (1) TABLET BY MOUTH DAILY   lovastatin (MEVACOR) 40 MG tablet Take 40 mg by mouth at bedtime.    Melatonin 10 MG TABS Take 40 mg by mouth at bedtime.   metFORMIN (GLUCOPHAGE) 1000 MG tablet Take 1,000 mg by mouth 2 (two) times daily with a meal.   oxyCODONE 10 MG TABS Take 1 tablet (10 mg total) by mouth every 6 (six) hours as needed for breakthrough pain. (Patient  taking differently: Take 10 mg by mouth every 4 (four) hours.)   pioglitazone (ACTOS) 45 MG tablet Take 45 mg by mouth daily.     Review of Systems      All other systems reviewed and are otherwise negative except as noted above.  Physical Exam    VS:  BP 110/62   Pulse 90   Ht '6\' 1"'$  (1.854 m)   Wt 212 lb (96.2 kg)   SpO2 98%   BMI 27.97 kg/m  , BMI Body mass index is 27.97 kg/m.  Wt Readings from Last 3 Encounters:  11/16/21 212 lb (96.2 kg)  11/13/21 215 lb 12.8 oz (97.9 kg)  07/23/21 212 lb 1.6 oz (96.2 kg)     GEN: Well nourished, well developed, in no acute distress. HEENT: normal. Neck: Supple, no JVD, carotid bruits, or masses. Cardiac: RRR, no murmurs, rubs, or gallops. No clubbing, cyanosis, edema.  Radials/PT 2+ and equal bilaterally.  Respiratory:  Respirations regular and unlabored, expiratory wheeze, otherwise clear to auscultation bilaterally. GI: Soft, nontender, nondistended. MS: No deformity or atrophy. Skin: Warm and dry, no rash. Neuro:  Strength and sensation are intact. Psych: Normal affect.  Assessment & Plan    Preop Clearance  Mr. Golebiewski perioperative risk of a major cardiac event is 6.6% according to the Revised Cardiac Risk Index (RCRI).  Therefore, he is at high risk for perioperative complications.   His functional capacity is good at 5.62 METs according to the Duke Activity Status Index (DASI). Recommendations: According to ACC/AHA guidelines, no further cardiovascular testing needed.  The patient may proceed to surgery at acceptable risk.   Antiplatelet  and/or Anticoagulation Recommendations: Aspirin can be held for 7 days prior to his surgery.  Please resume Aspirin post operatively when it is felt to be safe from a bleeding standpoint.   Chest discomfort -no issues, symptoms resolved -April troponin 04/27/21 negative    Essential hypertension -much better controlled today -Continue current medication regimen  Hyperlipidemia -He will need a repeat lipid panel and LFTs next time he is in the office -LDL 74 on most recent lab work  Type 2 DM -better controlled with medication change -patient is losing weight -per PCP         Disposition: Follow up 3 months with Donato Heinz, MD or APP.  Signed, Elgie Collard, PA-C 11/16/2021, 11:24 AM [place]

## 2021-11-16 ENCOUNTER — Ambulatory Visit (INDEPENDENT_AMBULATORY_CARE_PROVIDER_SITE_OTHER): Payer: PPO | Admitting: Physician Assistant

## 2021-11-16 ENCOUNTER — Encounter (HOSPITAL_COMMUNITY)
Admission: RE | Disposition: A | Discharge status: Home / Self Care | Payer: Self-pay | Source: Home / Self Care | Attending: Neurological Surgery

## 2021-11-16 ENCOUNTER — Ambulatory Visit (HOSPITAL_COMMUNITY): Payer: PPO

## 2021-11-16 ENCOUNTER — Ambulatory Visit (HOSPITAL_BASED_OUTPATIENT_CLINIC_OR_DEPARTMENT_OTHER): Payer: PPO | Admitting: Anesthesiology

## 2021-11-16 ENCOUNTER — Encounter: Payer: Self-pay | Admitting: Physician Assistant

## 2021-11-16 ENCOUNTER — Ambulatory Visit (HOSPITAL_COMMUNITY)
Admission: RE | Admit: 2021-11-16 | Discharge: 2021-11-17 | Disposition: A | Discharge status: Home / Self Care | Payer: PPO | Attending: Neurological Surgery | Admitting: Neurological Surgery

## 2021-11-16 ENCOUNTER — Ambulatory Visit (HOSPITAL_COMMUNITY): Payer: PPO | Admitting: Vascular Surgery

## 2021-11-16 ENCOUNTER — Other Ambulatory Visit: Payer: Self-pay

## 2021-11-16 ENCOUNTER — Encounter (HOSPITAL_COMMUNITY): Payer: Self-pay | Admitting: Neurological Surgery

## 2021-11-16 VITALS — BP 110/62 | HR 90 | Ht 73.0 in | Wt 212.0 lb

## 2021-11-16 DIAGNOSIS — M47812 Spondylosis without myelopathy or radiculopathy, cervical region: Secondary | ICD-10-CM | POA: Diagnosis present

## 2021-11-16 DIAGNOSIS — E782 Mixed hyperlipidemia: Secondary | ICD-10-CM

## 2021-11-16 DIAGNOSIS — I10 Essential (primary) hypertension: Secondary | ICD-10-CM

## 2021-11-16 DIAGNOSIS — F419 Anxiety disorder, unspecified: Secondary | ICD-10-CM | POA: Diagnosis not present

## 2021-11-16 DIAGNOSIS — M79602 Pain in left arm: Secondary | ICD-10-CM | POA: Insufficient documentation

## 2021-11-16 DIAGNOSIS — M542 Cervicalgia: Secondary | ICD-10-CM | POA: Insufficient documentation

## 2021-11-16 DIAGNOSIS — G4730 Sleep apnea, unspecified: Secondary | ICD-10-CM

## 2021-11-16 DIAGNOSIS — J45909 Unspecified asthma, uncomplicated: Secondary | ICD-10-CM | POA: Insufficient documentation

## 2021-11-16 DIAGNOSIS — Z01810 Encounter for preprocedural cardiovascular examination: Secondary | ICD-10-CM

## 2021-11-16 DIAGNOSIS — Z7984 Long term (current) use of oral hypoglycemic drugs: Secondary | ICD-10-CM | POA: Diagnosis not present

## 2021-11-16 DIAGNOSIS — Z981 Arthrodesis status: Secondary | ICD-10-CM | POA: Insufficient documentation

## 2021-11-16 DIAGNOSIS — R079 Chest pain, unspecified: Secondary | ICD-10-CM

## 2021-11-16 DIAGNOSIS — I2510 Atherosclerotic heart disease of native coronary artery without angina pectoris: Secondary | ICD-10-CM

## 2021-11-16 DIAGNOSIS — E113299 Type 2 diabetes mellitus with mild nonproliferative diabetic retinopathy without macular edema, unspecified eye: Secondary | ICD-10-CM

## 2021-11-16 DIAGNOSIS — M4802 Spinal stenosis, cervical region: Secondary | ICD-10-CM

## 2021-11-16 DIAGNOSIS — E1151 Type 2 diabetes mellitus with diabetic peripheral angiopathy without gangrene: Secondary | ICD-10-CM | POA: Insufficient documentation

## 2021-11-16 DIAGNOSIS — E1159 Type 2 diabetes mellitus with other circulatory complications: Secondary | ICD-10-CM

## 2021-11-16 DIAGNOSIS — F17210 Nicotine dependence, cigarettes, uncomplicated: Secondary | ICD-10-CM | POA: Insufficient documentation

## 2021-11-16 HISTORY — PX: ANTERIOR CERVICAL DECOMP/DISCECTOMY FUSION: SHX1161

## 2021-11-16 LAB — GLUCOSE, CAPILLARY
Glucose-Capillary: 182 mg/dL — ABNORMAL HIGH (ref 70–99)
Glucose-Capillary: 171 mg/dL — ABNORMAL HIGH (ref 70–99)
Glucose-Capillary: 104 mg/dL — ABNORMAL HIGH (ref 70–99)
Glucose-Capillary: 278 mg/dL — ABNORMAL HIGH (ref 70–99)

## 2021-11-16 SURGERY — ANTERIOR CERVICAL DECOMPRESSION/DISCECTOMY FUSION 1 LEVEL
Anesthesia: General | Site: Spine Cervical

## 2021-11-16 MED ORDER — FENTANYL CITRATE PF 50 MCG/ML IJ SOSY
25.0000 ug | PREFILLED_SYRINGE | INTRAMUSCULAR | Status: DC | PRN
  Administered 2021-11-16: 25 ug via INTRAVENOUS

## 2021-11-16 MED ORDER — SUGAMMADEX SODIUM 200 MG/2ML IV SOLN
INTRAVENOUS | Status: DC | PRN
  Administered 2021-11-16: 200 mg via INTRAVENOUS

## 2021-11-16 MED ORDER — THROMBIN 5000 UNITS EX SOLR
CUTANEOUS | Status: AC
  Filled 2021-11-16: qty 5000

## 2021-11-16 MED ORDER — FENTANYL CITRATE (PF) 250 MCG/5ML IJ SOLN
INTRAMUSCULAR | Status: AC
  Filled 2021-11-16: qty 5

## 2021-11-16 MED ORDER — LISINOPRIL 10 MG PO TABS
5.0000 mg | ORAL_TABLET | Freq: Every day | ORAL | Status: DC
  Administered 2021-11-16: 5 mg via ORAL
  Filled 2021-11-16: qty 1

## 2021-11-16 MED ORDER — CLINDAMYCIN PHOSPHATE 600 MG/50ML IV SOLN
600.0000 mg | INTRAVENOUS | Status: AC
  Administered 2021-11-16: 600 mg via INTRAVENOUS
  Filled 2021-11-16: qty 50

## 2021-11-16 MED ORDER — CLINDAMYCIN PHOSPHATE 600 MG/50ML IV SOLN
600.0000 mg | Freq: Once | INTRAVENOUS | Status: AC
  Administered 2021-11-16: 600 mg via INTRAVENOUS
  Filled 2021-11-16: qty 50

## 2021-11-16 MED ORDER — INSULIN ASPART 100 UNIT/ML IJ SOLN
0.0000 [IU] | INTRAMUSCULAR | Status: DC | PRN
  Administered 2021-11-16: 2 [IU] via SUBCUTANEOUS
  Filled 2021-11-16: qty 1

## 2021-11-16 MED ORDER — PIOGLITAZONE HCL 30 MG PO TABS
45.0000 mg | ORAL_TABLET | Freq: Every day | ORAL | Status: DC
  Administered 2021-11-16 – 2021-11-17 (×2): 45 mg via ORAL
  Filled 2021-11-16 (×2): qty 1

## 2021-11-16 MED ORDER — LIDOCAINE 2% (20 MG/ML) 5 ML SYRINGE
INTRAMUSCULAR | Status: AC
  Filled 2021-11-16: qty 15

## 2021-11-16 MED ORDER — CHLORHEXIDINE GLUCONATE CLOTH 2 % EX PADS: 6.0000 | MEDICATED_PAD | Freq: Once | CUTANEOUS | Status: DC

## 2021-11-16 MED ORDER — PHENOL 1.4 % MT LIQD
1.0000 | OROMUCOSAL | Status: DC | PRN
  Filled 2021-11-16: qty 177

## 2021-11-16 MED ORDER — CHLORHEXIDINE GLUCONATE 0.12 % MT SOLN
15.0000 mL | Freq: Once | OROMUCOSAL | Status: AC
  Administered 2021-11-16: 15 mL via OROMUCOSAL
  Filled 2021-11-16: qty 15

## 2021-11-16 MED ORDER — FENTANYL CITRATE (PF) 250 MCG/5ML IJ SOLN
INTRAMUSCULAR | Status: DC | PRN
  Administered 2021-11-16: 50 ug via INTRAVENOUS
  Administered 2021-11-16 (×2): 100 ug via INTRAVENOUS

## 2021-11-16 MED ORDER — VANCOMYCIN HCL IN DEXTROSE 1-5 GM/200ML-% IV SOLN: 1000.0000 mg | INTRAVENOUS | Status: DC

## 2021-11-16 MED ORDER — MIDAZOLAM HCL 2 MG/2ML IJ SOLN
INTRAMUSCULAR | Status: DC | PRN
  Administered 2021-11-16: 2 mg via INTRAVENOUS

## 2021-11-16 MED ORDER — ORAL CARE MOUTH RINSE: 15.0000 mL | Freq: Once | OROMUCOSAL | Status: AC

## 2021-11-16 MED ORDER — PROPOFOL 10 MG/ML IV BOLUS
INTRAVENOUS | Status: DC | PRN
  Administered 2021-11-16: 180 mg via INTRAVENOUS

## 2021-11-16 MED ORDER — HYDROMORPHONE HCL 1 MG/ML IJ SOLN
INTRAMUSCULAR | Status: AC
  Filled 2021-11-16: qty 1

## 2021-11-16 MED ORDER — MENTHOL 3 MG MT LOZG: 1.0000 | LOZENGE | OROMUCOSAL | Status: DC | PRN

## 2021-11-16 MED ORDER — ALBUTEROL SULFATE (2.5 MG/3ML) 0.083% IN NEBU: 2.5000 mg | INHALATION_SOLUTION | Freq: Four times a day (QID) | RESPIRATORY_TRACT | Status: DC | PRN

## 2021-11-16 MED ORDER — LACTATED RINGERS IV SOLN: INTRAVENOUS | Status: DC

## 2021-11-16 MED ORDER — LIDOCAINE 2% (20 MG/ML) 5 ML SYRINGE
INTRAMUSCULAR | Status: AC
  Filled 2021-11-16: qty 5

## 2021-11-16 MED ORDER — ACETAMINOPHEN 500 MG PO TABS
1000.0000 mg | ORAL_TABLET | ORAL | Status: AC
  Administered 2021-11-16: 1000 mg via ORAL
  Filled 2021-11-16: qty 2

## 2021-11-16 MED ORDER — METHOCARBAMOL 500 MG PO TABS
500.0000 mg | ORAL_TABLET | Freq: Four times a day (QID) | ORAL | Status: DC | PRN
  Administered 2021-11-16 – 2021-11-17 (×2): 500 mg via ORAL
  Filled 2021-11-16 (×2): qty 1

## 2021-11-16 MED ORDER — MIDAZOLAM HCL 2 MG/2ML IJ SOLN
INTRAMUSCULAR | Status: AC
  Filled 2021-11-16: qty 2

## 2021-11-16 MED ORDER — HYDROMORPHONE HCL 1 MG/ML IJ SOLN
0.2500 mg | INTRAMUSCULAR | Status: DC | PRN
  Administered 2021-11-16: 0.25 mg via INTRAVENOUS
  Administered 2021-11-16: 0.5 mg via INTRAVENOUS
  Administered 2021-11-16: 0.25 mg via INTRAVENOUS

## 2021-11-16 MED ORDER — AMISULPRIDE (ANTIEMETIC) 5 MG/2ML IV SOLN: 10.0000 mg | Freq: Once | INTRAVENOUS | Status: DC | PRN

## 2021-11-16 MED ORDER — LACTATED RINGERS IV SOLN: INTRAVENOUS | Status: DC | PRN

## 2021-11-16 MED ORDER — ONDANSETRON HCL 4 MG/2ML IJ SOLN
INTRAMUSCULAR | Status: DC | PRN
  Administered 2021-11-16: 4 mg via INTRAVENOUS

## 2021-11-16 MED ORDER — LIDOCAINE 2% (20 MG/ML) 5 ML SYRINGE
INTRAMUSCULAR | Status: DC | PRN
  Administered 2021-11-16: 60 mg via INTRAVENOUS

## 2021-11-16 MED ORDER — FENTANYL CITRATE (PF) 100 MCG/2ML IJ SOLN
INTRAMUSCULAR | Status: AC
  Filled 2021-11-16: qty 2

## 2021-11-16 MED ORDER — FENTANYL CITRATE PF 50 MCG/ML IJ SOSY: 25.0000 ug | PREFILLED_SYRINGE | INTRAMUSCULAR | Status: DC | PRN

## 2021-11-16 MED ORDER — METHOCARBAMOL 1000 MG/10ML IJ SOLN
500.0000 mg | Freq: Four times a day (QID) | INTRAVENOUS | Status: DC | PRN
  Filled 2021-11-16: qty 5

## 2021-11-16 MED ORDER — ROCURONIUM BROMIDE 10 MG/ML (PF) SYRINGE
PREFILLED_SYRINGE | INTRAVENOUS | Status: AC
  Filled 2021-11-16: qty 30

## 2021-11-16 MED ORDER — INSULIN ASPART 100 UNIT/ML IJ SOLN
0.0000 [IU] | Freq: Every day | INTRAMUSCULAR | Status: DC
  Administered 2021-11-16: 3 [IU] via SUBCUTANEOUS

## 2021-11-16 MED ORDER — ACETAMINOPHEN 10 MG/ML IV SOLN: 1000.0000 mg | Freq: Once | INTRAVENOUS | Status: DC | PRN

## 2021-11-16 MED ORDER — PROMETHAZINE HCL 25 MG/ML IJ SOLN: 6.2500 mg | INTRAMUSCULAR | Status: DC | PRN

## 2021-11-16 MED ORDER — ROCURONIUM BROMIDE 10 MG/ML (PF) SYRINGE
PREFILLED_SYRINGE | INTRAVENOUS | Status: AC
  Filled 2021-11-16: qty 10

## 2021-11-16 MED ORDER — PHENYLEPHRINE 80 MCG/ML (10ML) SYRINGE FOR IV PUSH (FOR BLOOD PRESSURE SUPPORT)
PREFILLED_SYRINGE | INTRAVENOUS | Status: AC
  Filled 2021-11-16: qty 20

## 2021-11-16 MED ORDER — PHENYLEPHRINE HCL-NACL 20-0.9 MG/250ML-% IV SOLN
INTRAVENOUS | Status: DC | PRN
  Administered 2021-11-16: 25 ug/min via INTRAVENOUS

## 2021-11-16 MED ORDER — POTASSIUM CHLORIDE IN NACL 20-0.9 MEQ/L-% IV SOLN: INTRAVENOUS | Status: DC

## 2021-11-16 MED ORDER — BUPIVACAINE HCL (PF) 0.25 % IJ SOLN
INTRAMUSCULAR | Status: AC
  Filled 2021-11-16: qty 30

## 2021-11-16 MED ORDER — ACETAMINOPHEN 500 MG PO TABS
1000.0000 mg | ORAL_TABLET | Freq: Four times a day (QID) | ORAL | Status: DC
  Administered 2021-11-16 – 2021-11-17 (×3): 1000 mg via ORAL
  Filled 2021-11-16 (×3): qty 2

## 2021-11-16 MED ORDER — PROPOFOL 10 MG/ML IV BOLUS
INTRAVENOUS | Status: AC
  Filled 2021-11-16: qty 20

## 2021-11-16 MED ORDER — DEXAMETHASONE SODIUM PHOSPHATE 10 MG/ML IJ SOLN
INTRAMUSCULAR | Status: AC
  Filled 2021-11-16: qty 1

## 2021-11-16 MED ORDER — METFORMIN HCL 500 MG PO TABS
1000.0000 mg | ORAL_TABLET | Freq: Two times a day (BID) | ORAL | Status: DC
  Administered 2021-11-17: 1000 mg via ORAL
  Filled 2021-11-16: qty 2

## 2021-11-16 MED ORDER — ALBUTEROL SULFATE HFA 108 (90 BASE) MCG/ACT IN AERS: 1.0000 | INHALATION_SPRAY | Freq: Four times a day (QID) | RESPIRATORY_TRACT | Status: DC | PRN

## 2021-11-16 MED ORDER — CLONAZEPAM 0.5 MG PO TABS
0.5000 mg | ORAL_TABLET | Freq: Two times a day (BID) | ORAL | Status: DC
  Administered 2021-11-16: 0.5 mg via ORAL
  Filled 2021-11-16: qty 1

## 2021-11-16 MED ORDER — DEXAMETHASONE SODIUM PHOSPHATE 10 MG/ML IJ SOLN
INTRAMUSCULAR | Status: DC | PRN
  Administered 2021-11-16: 4 mg via INTRAVENOUS

## 2021-11-16 MED ORDER — ONDANSETRON HCL 4 MG/2ML IJ SOLN: 4.0000 mg | Freq: Four times a day (QID) | INTRAMUSCULAR | Status: DC | PRN

## 2021-11-16 MED ORDER — MEPERIDINE HCL 25 MG/ML IJ SOLN: 6.2500 mg | INTRAMUSCULAR | Status: DC | PRN

## 2021-11-16 MED ORDER — MORPHINE SULFATE (PF) 2 MG/ML IV SOLN
2.0000 mg | INTRAVENOUS | Status: DC | PRN
  Administered 2021-11-16: 2 mg via INTRAVENOUS
  Filled 2021-11-16: qty 1

## 2021-11-16 MED ORDER — SODIUM CHLORIDE 0.9 % IV SOLN: 250.0000 mL | INTRAVENOUS | Status: DC

## 2021-11-16 MED ORDER — ONDANSETRON HCL 4 MG PO TABS: 4.0000 mg | ORAL_TABLET | Freq: Four times a day (QID) | ORAL | Status: DC | PRN

## 2021-11-16 MED ORDER — SENNA 8.6 MG PO TABS
1.0000 | ORAL_TABLET | Freq: Two times a day (BID) | ORAL | Status: DC
  Administered 2021-11-16: 8.6 mg via ORAL
  Filled 2021-11-16: qty 1

## 2021-11-16 MED ORDER — SODIUM CHLORIDE 0.9% FLUSH: 3.0000 mL | INTRAVENOUS | Status: DC | PRN

## 2021-11-16 MED ORDER — GLIPIZIDE ER 10 MG PO TB24
10.0000 mg | ORAL_TABLET | Freq: Two times a day (BID) | ORAL | Status: DC
  Administered 2021-11-16 – 2021-11-17 (×2): 10 mg via ORAL
  Filled 2021-11-16 (×3): qty 1

## 2021-11-16 MED ORDER — OXYCODONE HCL 5 MG PO TABS
10.0000 mg | ORAL_TABLET | ORAL | Status: DC | PRN
  Administered 2021-11-16 – 2021-11-17 (×5): 10 mg via ORAL
  Filled 2021-11-16 (×5): qty 2

## 2021-11-16 MED ORDER — ACETAMINOPHEN 325 MG PO TABS: 325.0000 mg | ORAL_TABLET | Freq: Once | ORAL | Status: DC | PRN

## 2021-11-16 MED ORDER — INSULIN ASPART 100 UNIT/ML IJ SOLN: 0.0000 [IU] | Freq: Three times a day (TID) | INTRAMUSCULAR | Status: DC

## 2021-11-16 MED ORDER — ONDANSETRON HCL 4 MG/2ML IJ SOLN
INTRAMUSCULAR | Status: AC
  Filled 2021-11-16: qty 2

## 2021-11-16 MED ORDER — CYCLOBENZAPRINE HCL 10 MG PO TABS
10.0000 mg | ORAL_TABLET | Freq: Three times a day (TID) | ORAL | Status: DC
  Administered 2021-11-16 – 2021-11-17 (×2): 10 mg via ORAL
  Filled 2021-11-16 (×2): qty 1

## 2021-11-16 MED ORDER — GABAPENTIN 300 MG PO CAPS
300.0000 mg | ORAL_CAPSULE | ORAL | Status: AC
  Administered 2021-11-16: 300 mg via ORAL
  Filled 2021-11-16: qty 1

## 2021-11-16 MED ORDER — ACETAMINOPHEN 160 MG/5ML PO SOLN: 325.0000 mg | Freq: Once | ORAL | Status: DC | PRN

## 2021-11-16 MED ORDER — ROCURONIUM BROMIDE 10 MG/ML (PF) SYRINGE
PREFILLED_SYRINGE | INTRAVENOUS | Status: DC | PRN
  Administered 2021-11-16: 60 mg via INTRAVENOUS
  Administered 2021-11-16: 10 mg via INTRAVENOUS
  Administered 2021-11-16: 30 mg via INTRAVENOUS

## 2021-11-16 MED ORDER — SODIUM CHLORIDE 0.9% FLUSH
3.0000 mL | Freq: Two times a day (BID) | INTRAVENOUS | Status: DC
  Administered 2021-11-16: 3 mL via INTRAVENOUS

## 2021-11-16 MED ORDER — INSULIN ASPART 100 UNIT/ML IJ SOLN
0.0000 [IU] | Freq: Three times a day (TID) | INTRAMUSCULAR | Status: DC
  Administered 2021-11-17: 3 [IU] via SUBCUTANEOUS

## 2021-11-16 SURGICAL SUPPLY — 50 items
APL SKNCLS STERI-STRIP NONHPOA (GAUZE/BANDAGES/DRESSINGS) ×1
BAG COUNTER SPONGE SURGICOUNT (BAG) ×2 IMPLANT
BAG SPNG CNTER NS LX DISP (BAG) ×1
BAND INSRT 18 STRL LF DISP RB (MISCELLANEOUS) ×2
BAND RUBBER #18 3X1/16 STRL (MISCELLANEOUS) ×4 IMPLANT
BASKET BONE COLLECTION (BASKET) IMPLANT
BENZOIN TINCTURE PRP APPL 2/3 (GAUZE/BANDAGES/DRESSINGS) ×2 IMPLANT
BONE TRINITY ELITE 1.2CC SM (Bone Implant) ×1 IMPLANT
BUR CARBIDE MATCH 3.0 (BURR) ×2 IMPLANT
CANISTER SUCT 3000ML PPV (MISCELLANEOUS) ×2 IMPLANT
DRAPE C-ARM 42X72 X-RAY (DRAPES) ×4 IMPLANT
DRAPE LAPAROTOMY 100X72 PEDS (DRAPES) ×2 IMPLANT
DRAPE MICROSCOPE SLANT 54X150 (MISCELLANEOUS) ×2 IMPLANT
DRSG OPSITE POSTOP 4X6 (GAUZE/BANDAGES/DRESSINGS) ×1 IMPLANT
DURAPREP 6ML APPLICATOR 50/CS (WOUND CARE) ×2 IMPLANT
ELECT COATED BLADE 2.86 ST (ELECTRODE) ×2 IMPLANT
ELECT REM PT RETURN 9FT ADLT (ELECTROSURGICAL) ×1
ELECTRODE REM PT RTRN 9FT ADLT (ELECTROSURGICAL) ×1 IMPLANT
GAUZE 4X4 16PLY ~~LOC~~+RFID DBL (SPONGE) IMPLANT
GLOVE BIO SURGEON STRL SZ7 (GLOVE) IMPLANT
GLOVE BIO SURGEON STRL SZ8 (GLOVE) ×2 IMPLANT
GLOVE BIOGEL PI IND STRL 7.0 (GLOVE) IMPLANT
GOWN STRL REUS W/ TWL LRG LVL3 (GOWN DISPOSABLE) IMPLANT
GOWN STRL REUS W/ TWL XL LVL3 (GOWN DISPOSABLE) IMPLANT
GOWN STRL REUS W/TWL 2XL LVL3 (GOWN DISPOSABLE) ×2 IMPLANT
GOWN STRL REUS W/TWL LRG LVL3 (GOWN DISPOSABLE)
GOWN STRL REUS W/TWL XL LVL3 (GOWN DISPOSABLE)
HEMOSTAT POWDER KIT SURGIFOAM (HEMOSTASIS) ×2 IMPLANT
KIT BASIN OR (CUSTOM PROCEDURE TRAY) ×2 IMPLANT
KIT TURNOVER KIT B (KITS) ×2 IMPLANT
NDL HYPO 25X1 1.5 SAFETY (NEEDLE) ×2 IMPLANT
NDL SPNL 20GX3.5 QUINCKE YW (NEEDLE) ×2 IMPLANT
NEEDLE HYPO 25X1 1.5 SAFETY (NEEDLE) ×1
NEEDLE SPNL 20GX3.5 QUINCKE YW (NEEDLE) ×1
NS IRRIG 1000ML POUR BTL (IV SOLUTION) ×2 IMPLANT
PACK LAMINECTOMY NEURO (CUSTOM PROCEDURE TRAY) ×2 IMPLANT
PAD ARMBOARD 7.5X6 YLW CONV (MISCELLANEOUS) ×2 IMPLANT
PIN DISTRACTION 14MM (PIN) IMPLANT
PLATE ANT CERV XTEND 1 LV 18 (Plate) ×1 IMPLANT
SCREW VAR 4.2 XD SELF DRILL 14 (Screw) ×1 IMPLANT
SCREW VAR 4.2 XD SELF DRILL 16 (Screw) ×3 IMPLANT
SPACER HEDRON 14X16X8 0D (Spacer) ×1 IMPLANT
SPONGE INTESTINAL PEANUT (DISPOSABLE) ×2 IMPLANT
SPONGE SURGIFOAM ABS GEL SZ50 (HEMOSTASIS) IMPLANT
STRIP CLOSURE SKIN 1/2X4 (GAUZE/BANDAGES/DRESSINGS) ×2 IMPLANT
SUT VIC AB 3-0 SH 8-18 (SUTURE) ×2 IMPLANT
SUT VICRYL 4-0 PS2 18IN ABS (SUTURE) IMPLANT
TOWEL GREEN STERILE (TOWEL DISPOSABLE) ×2 IMPLANT
TOWEL GREEN STERILE FF (TOWEL DISPOSABLE) ×2 IMPLANT
WATER STERILE IRR 1000ML POUR (IV SOLUTION) ×2 IMPLANT

## 2021-11-16 NOTE — Transfer of Care (Signed)
Immediate Anesthesia Transfer of Care Note  Patient: Wayne Pratt  Procedure(s) Performed: CERVICAL FOUR-FIVE ANTERIOR CERVICAL DECOMPRESSION/DISCECTOMY FUSION WITH PLATE REMOVAL (Spine Cervical)  Patient Location: PACU  Anesthesia Type:General  Level of Consciousness: awake and alert   Airway & Oxygen Therapy: Patient Spontanous Breathing and Patient connected to face mask oxygen  Post-op Assessment: Report given to RN and Post -op Vital signs reviewed and stable  Post vital signs: Reviewed and stable  Last Vitals:  Vitals Value Taken Time  BP 122/88 11/16/21 1646  Temp 36.4 C 11/16/21 1643  Pulse 96 11/16/21 1650  Resp 17 11/16/21 1650  SpO2 100 % 11/16/21 1650  Vitals shown include unvalidated device data.  Last Pain:  Vitals:   11/16/21 1420  TempSrc:   PainSc: 3       Patients Stated Pain Goal: 2 (81/18/86 [id number])  Complications: No notable events documented.

## 2021-11-16 NOTE — Anesthesia Procedure Notes (Signed)
Procedure Name: Intubation Date/Time: 11/16/2021 2:47 PM  Performed by: Inda Coke, CRNAPre-anesthesia Checklist: Patient identified, Emergency Drugs available, Suction available, Timeout performed and Patient being monitored Patient Re-evaluated:Patient Re-evaluated prior to induction Oxygen Delivery Method: Circle system utilized Preoxygenation: Pre-oxygenation with 100% oxygen Induction Type: IV induction Ventilation: Mask ventilation without difficulty, Oral airway inserted - appropriate to patient size and Two handed mask ventilation required Laryngoscope Size: Glidescope and 4 Grade View: Grade I Tube type: Oral Tube size: 7.5 mm Number of attempts: 1 Airway Equipment and Method: Rigid stylet Placement Confirmation: ETT inserted through vocal cords under direct vision, positive ETCO2, CO2 detector and breath sounds checked- equal and bilateral Secured at: 22 cm Tube secured with: Tape Dental Injury: Teeth and Oropharynx as per pre-operative assessment

## 2021-11-16 NOTE — Progress Notes (Addendum)
Pharmacy Antibiotic Note  Wayne Pratt is a 61 y.o. male s/p spinal procedure with no drain in place Pharmacy has been consulted for vancomycin dosing for surgical prophylaxis -SCr= 0.9 on 11/3 -clindamycin '600mg'$  IV given ~ 3pm today -Allergies noted with anaphlaxis with vancomycin  Plan: -Due to vancomycin allergy will continue with clindamycin 600 mg IV x1  at 9pm -Will sign off. Please contact pharmacy with any other needs.  Thank you Hildred Laser, PharmD Clinical Pharmacist **Pharmacist phone directory can now be found on [email or web address] ([email or web address]).  Listed under [place].

## 2021-11-16 NOTE — Progress Notes (Signed)
Pt complaining of neck pain rated "10" out of "10". He is requesting pain medicine. He took 10 mg Oxycodone at home this am at 0630. He usually takes this every 4 hours at home. Notified Dr. Smith Robert of this information. Dr. Smith Robert will come to see patient. Patient and wife informed.

## 2021-11-16 NOTE — H&P (Signed)
Subjective:   Patient is a 61 y.o. male admitted for neck pain. The patient first presented to me with complaints of neck pain and shooting pains in the arm(s). Onset of symptoms was several months ago. The pain is described as aching, stabbing, and throbbing and occurs all day. The pain is rated severe, and is located in the neck and radiates to the LUE. The symptoms have been progressive. Symptoms are exacerbated by extending head backwards, and are relieved by none.  Previous work up includes MRI of cervical spine, results: spinal stenosis.  Past Medical History:  Diagnosis Date   Anemia    Anxiety    Arthritis    Asthma    Cervical disc disease    Essential hypertension    History of kidney stones    History of pleural empyema    History of pneumonia    History of skin cancer    Hypertension    Insomnia    Migraine    Mixed hyperlipidemia    Nephrolithiasis    Neuropathy    PAD (peripheral artery disease) (HCC)    Sleep apnea    Stop Bang score of 7   Type 2 diabetes mellitus ([place])     Past Surgical History:  Procedure Laterality Date   ABDOMINAL AORTOGRAM W/LOWER EXTREMITY N/A 01/31/2020   Procedure: ABDOMINAL AORTOGRAM W/LOWER EXTREMITY;  Surgeon: Marty Heck, MD;  Location: [facility];  Service: Cardiovascular;  Laterality: N/A;   AMPUTATION Right 02/29/2020   Procedure: RIGHT TOE AMPUTATIONS, 1, 2, 5;  Surgeon: Marty Heck, MD;  Location: [place];  Service: Vascular;  Laterality: Right;   APPLICATION OF WOUND VAC Right 02/29/2020   Procedure: APPLICATION OF WOUND VAC;  Surgeon: Marty Heck, MD;  Location: [place];  Service: Vascular;  Laterality: Right;   APPLICATION OF WOUND VAC Right 04/16/2020   Procedure: APPLICATION OF WOUND VAC;  Surgeon: Serafina Mitchell, MD;  Location: [place];  Service: Vascular;  Laterality: Right;   BIOPSY  10/05/2011   Dr. Oneida Alar :non-erosive gastritis   BIOPSY  08/06/2014   Procedure: BIOPSY;  Surgeon: Danie Binder, MD;  Location: [facility];  Service: Endoscopy;;   CERVICAL DISC SURGERY     ELBOW SURGERY Right    ENDARTERECTOMY FEMORAL Right 02/04/2020   Procedure: RIGHT FEMORAL ENDARTERECTOMY with Profundaplasty;  Surgeon: Marty Heck, MD;  Location: [place];  Service: Vascular;  Laterality: Right;   ESOPHAGOGASTRODUODENOSCOPY  09/2011   Dr. Oneida Alar: chronic inactive gastritis    ESOPHAGOGASTRODUODENOSCOPY N/A 08/18/2017   Procedure: ESOPHAGOGASTRODUODENOSCOPY (EGD);  Surgeon: Milus Banister, MD;  Location: [facility];  Service: Endoscopy;  Laterality: N/A;   ESOPHAGOGASTRODUODENOSCOPY N/A 03/09/2018   Procedure: ESOPHAGOGASTRODUODENOSCOPY (EGD);  Surgeon: Milus Banister, MD;  Location: [facility];  Service: Endoscopy;  Laterality: N/A;   ESOPHAGOGASTRODUODENOSCOPY (EGD) WITH PROPOFOL N/A 08/06/2014   mild non-erosive gastritis, duodenitis   EUS N/A 08/18/2017   Procedure: UPPER ENDOSCOPIC ULTRASOUND (EUS) RADIAL;  Surgeon: Milus Banister, MD;  Location: [facility];  Service: Endoscopy;  Laterality: N/A;   EUS N/A 03/09/2018   Procedure: UPPER ENDOSCOPIC ULTRASOUND (EUS) RADIAL;  Surgeon: Milus Banister, MD;  Location: [facility];  Service: Endoscopy;  Laterality: N/A;   EYE SURGERY Right    fixed tear in the cornea with laser   FEMORAL-POPLITEAL BYPASS GRAFT Right 02/04/2020   Procedure: BYPASS GRAFT FEMORAL-POPLITEAL ARTERY With Vein Harvest;  Surgeon: Marty Heck, MD;  Location: [place];  Service: Vascular;  Laterality: Right;   FINE NEEDLE ASPIRATION  03/09/2018   Procedure: FINE NEEDLE ASPIRATION;  Surgeon: Milus Banister, MD;  Location: [facility];  Service: Endoscopy;;   FLEXIBLE SIGMOIDOSCOPY  10/05/2011   Dr. Fields:poor prep   I & D EXTREMITY Right 04/16/2020   Procedure: RIGHT CALF WOUND IRRIGATION, DEBRIDEMENT AND GRAFTING;  Surgeon: Serafina Mitchell, MD;  Location: [place];  Service: Vascular;  Laterality: Right;   LUNG SURGERY  2010   for empyema    PATCH ANGIOPLASTY Right 02/04/2020   Procedure: PATCH ANGIOPLASTY;  Surgeon: Marty Heck, MD;  Location: [place];  Service: Vascular;  Laterality: Right;   SPINAL CORD STIMULATOR IMPLANT  08/29/2019   [employer] scientific wavewriter alpha [phone]   SPINAL CORD STIMULATOR INSERTION N/A 08/01/2015   Procedure: Spinal Cord Stimulator Implant;  Surgeon: Eustace Moore, MD;  Location: [facility];  Service: Neurosurgery;  Laterality: N/A;   TONSILLECTOMY     VASECTOMY Bilateral 1995   VEIN HARVEST Right 02/04/2020   Procedure: VEIN HARVEST Greater Saphenous Vein;  Surgeon: Marty Heck, MD;  Location: [place];  Service: Vascular;  Laterality: Right;   WOUND DEBRIDEMENT Right 02/29/2020   Procedure: RIGHT CALF DEBRIDEMENT;  Surgeon: Marty Heck, MD;  Location: [place];  Service: Vascular;  Laterality: Right;    Allergies  Allergen Reactions   Penicillins Anaphylaxis and Other (See Comments)    Has patient had a PCN reaction causing immediate rash, facial/tongue/throat swelling, SOB or lightheadedness with hypotension: Yes Has patient had a PCN reaction causing severe rash involving mucus membranes or skin necrosis: Yes Has patient had a PCN reaction that required hospitalization: No Has patient had a PCN reaction occurring within the last 10 years: No If all of the above answers are "NO", then may proceed with Cephalosporin use.    Vancomycin Anaphylaxis   Ativan [Lorazepam] Other (See Comments)    Violent and Mean    Baclofen     Social History   Tobacco Use   Smoking status: Every Day    Packs/day: 1.00    Years: 39.00    Total pack years: 39.00    Types: Cigarettes    Last attempt to quit: 02/04/2020    Years since quitting: 1.7   Smokeless tobacco: Never  Substance Use Topics   Alcohol use: No    Alcohol/week: 0.0 standard drinks of alcohol    Family History  Problem Relation Age of Onset   Prostate cancer Other        all male members on mother's  side   Heart attack Father    COPD Maternal Grandmother    Cancer Paternal Grandmother    Colon cancer Neg Hx    Prior to Admission medications   Medication Sig Start Date End Date Taking? Authorizing Provider  acetaminophen (TYLENOL) 650 MG CR tablet Take 1,300 mg by mouth every 8 (eight) hours as needed for pain.   Yes [provider]  albuterol (VENTOLIN HFA) 108 (90 Base) MCG/ACT inhaler Inhale 1-2 puffs into the lungs every 6 (six) hours as needed for wheezing or shortness of breath. 03/15/19  Yes Avegno, Darrelyn Hillock, FNP  aspirin EC 81 MG EC tablet Take 1 tablet (81 mg total) by mouth daily at 6 (six) AM. Swallow whole. 02/08/20  Yes Dagoberto Ligas, PA-C  clonazePAM (KLONOPIN) 0.5 MG tablet Take 0.5 mg by mouth 2 (two) times daily.  06/17/17  Yes [provider]  cyclobenzaprine (FLEXERIL)  10 MG tablet Take 10 mg by mouth 3 (three) times daily. 03/09/21  Yes [provider]  GLIPIZIDE XL 10 MG 24 hr tablet Take 10 mg by mouth 2 (two) times daily. 01/17/19  Yes [provider]  LEVEMIR FLEXPEN 100 UNIT/ML FlexPen Inject 15 Units into the skin daily as needed (High Blood Sugar). 07/22/21  Yes [provider]  lisinopril (ZESTRIL) 5 MG tablet TAKE (1) TABLET BY MOUTH DAILY 11/04/21  Yes Satira Sark, MD  lovastatin (MEVACOR) 40 MG tablet Take 40 mg by mouth at bedtime.  04/01/17  Yes [provider]  Melatonin 10 MG TABS Take 40 mg by mouth at bedtime.   Yes [provider]  metFORMIN (GLUCOPHAGE) 1000 MG tablet Take 1,000 mg by mouth 2 (two) times daily with a meal. 04/01/17  Yes [provider]  oxyCODONE 10 MG TABS Take 1 tablet (10 mg total) by mouth every 6 (six) hours as needed for breakthrough pain. Patient taking differently: Take 10 mg by mouth every 4 (four) hours. 06/19/20  Yes Barton Dubois, MD  pioglitazone (ACTOS) 45 MG tablet Take 45 mg by mouth daily. 04/13/21  Yes [provider]     Review of  Systems  Positive ROS: neg  All other systems have been reviewed and were otherwise negative with the exception of those mentioned in the HPI and as above.  Objective: Vital signs in last 24 hours: Temp:  [97.9 F (36.6 C)] 97.9 F (36.6 C) (11/06 1214) Pulse Rate:  [88-90] 88 (11/06 1214) Resp:  [17] 17 (11/06 1214) BP: (110-126)/(62-76) 126/76 (11/06 1214) SpO2:  [98 %] 98 % (11/06 1048) Weight:  [95.7 kg-96.2 kg] 95.7 kg (11/06 1214)  General Appearance: Alert, cooperative, no distress, appears stated age Head: Normocephalic, without obvious abnormality, atraumatic Eyes: PERRL, conjunctiva/corneas clear, EOM's intact      Neck: Supple, symmetrical, trachea midline, Back: Symmetric, no curvature, ROM normal, no CVA tenderness Lungs:  respirations unlabored Heart: Regular rate and rhythm Abdomen: Soft, non-tender Extremities: Extremities normal, atraumatic, no cyanosis or edema Pulses: 2+ and symmetric all extremities Skin: Skin color, texture, turgor normal, no rashes or lesions  NEUROLOGIC:  Mental status: Alert and oriented x4, no aphasia, good attention span, fund of knowledge and memory  Motor Exam - grossly normal Sensory Exam - grossly normal Reflexes: 1+ Coordination - grossly normal Gait - grossly normal Balance - grossly normal Cranial Nerves: I: smell Not tested  II: visual acuity  OS: nl    OD: nl  II: visual fields Full to confrontation  II: pupils Equal, round, reactive to light  III,VII: ptosis None  III,IV,VI: extraocular muscles  Full ROM  V: mastication Normal  V: facial light touch sensation  Normal  V,VII: corneal reflex  Present  VII: facial muscle function - upper  Normal  VII: facial muscle function - lower Normal  VIII: hearing Not tested  IX: soft palate elevation  Normal  IX,X: gag reflex Present  XI: trapezius strength  5/5  XI: sternocleidomastoid strength 5/5  XI: neck flexion strength  5/5  XII: tongue strength  Normal    Data  Review Lab Results  Component Value Date   WBC 8.4 11/13/2021   HGB 12.2 (L) 11/13/2021   HCT 37.5 (L) 11/13/2021   MCV 93.8 11/13/2021   PLT 310 11/13/2021   Lab Results  Component Value Date   NA 138 11/13/2021   K 4.3 11/13/2021   CL 102 11/13/2021   CO2  26 11/13/2021   BUN 15 11/13/2021   CREATININE 0.92 11/13/2021   GLUCOSE 169 (H) 11/13/2021   Lab Results  Component Value Date   INR 1.0 11/13/2021    Assessment:   Cervical neck pain with herniated nucleus pulposus/ spondylosis/ stenosis at C4-5. Estimated body mass index is 27.84 kg/m as calculated from the following:   Height as of this encounter: '6\' 1"'$  (1.854 m).   Weight as of this encounter: 95.7 kg.  Patient has failed conservative therapy. Planned surgery : ACDF c4-5  Plan:   I explained the condition and procedure to the patient and answered any questions.  Patient wishes to proceed with procedure as planned. Understands risks/ benefits/ and expected or typical outcomes.  He is cleared for surgery by Cards and ENT eval [facility]  Eustace Moore 11/16/2021 1:32 PM

## 2021-11-16 NOTE — Op Note (Signed)
11/16/2021  4:40 PM  PATIENT:  Wayne Pratt  61 y.o. male  PRE-OPERATIVE DIAGNOSIS: Adjacent level spondylosis with adjacent stenosis C4-5 with neck pain and left arm pain  POST-OPERATIVE DIAGNOSIS:  same  PROCEDURE:  1. Decompressive anterior cervical discectomy C4-5, 2. Anterior cervical arthrodesis C4- 5 utilizing a titanium interbody cage packed with locally harvested morcellized autologous bone graft and morselized allograft, 3. Anterior cervical plating C4-5 utilizing a globus plate, 4.  Removal of anterior cervical plate C5-6  SURGEON:  Sherley Bounds, MD  ASSISTANTS: Glenford Peers FNP  ANESTHESIA:   General  EBL: 25 ml  Total I/O In: 1500 [I.V.:1500] Out: 25 [Blood:25]  BLOOD ADMINISTERED: none  DRAINS: none  SPECIMEN:  none  INDICATION FOR PROCEDURE: This patient presented with neck pain with left arm pain.  He had a previous ACDF with plating at C5-6 and C6-7 in the remote past.  Imaging showed patient level spondylosis with stenosis. The patient tried conservative measures without relief. Pain was debilitating. Recommended ACDF with plating. Patient understood the risks, benefits, and alternatives and potential outcomes and wished to proceed.  PROCEDURE DETAILS: Patient was brought to the operating room placed under general endotracheal anesthesia. Patient was placed in the supine position on the operating room table. The neck was prepped with Duraprep and draped in a sterile fashion.   Three cc of local anesthesia was injected and a transverse incision was made on the right side of the neck.  Dissection was carried down thru the subcutaneous tissue and the platysma was  elevated, opened, and undermined with Metzenbaum scissors.  Dissection was then carried out thru an avascular plane leaving the sternocleidomastoid carotid artery and jugular vein laterally and the trachea and esophagus medially with the assistance of my nurse practitioner. The ventral aspect of the vertebral  column was identified as well as the previously placed plate.  We used a metal cutting bur to cut across the plate at H9-6 and then remove the screws from C5 and removed the top part of the plate.  And a localizing x-ray was taken. The C4-5 level was identified and all in the room agreed with the level. The longus colli muscles were then elevated and the retractor was placed with the assistance of my nurse practitioner. The annulus was incised and the disc space entered. Discectomy was performed with micro-curettes and pituitary rongeurs. I then used the high-speed drill to drill the endplates down to the level of the posterior longitudinal ligament. The drill shavings were saved in a mucous trap for later arthrodesis. The operating microscope was draped and brought into the field provided additional magnification, illumination and visualization. Discectomy was continued posteriorly thru the disc space. Posterior longitudinal ligament was opened with a nerve hook, and then removed along with disc herniation and osteophytes, decompressing the spinal canal and thecal sac. We then continued to remove osteophytic overgrowth and disc material decompressing the neural foramina and exiting nerve roots bilaterally. The scope was angled up and down to help decompress and undercut the vertebral bodies. Once the decompression was completed we could pass a nerve hook circumferentially to assure adequate decompression in the midline and in the neural foramina. So by both visualization and palpation we felt we had an adequate decompression of the neural elements. We then measured the height of the intravertebral disc space and selected a 8 mm globus 3D printing titanium interbody cage packed with autograft and morselized allograft. It was then gently positioned in the intravertebral disc space(s) and  countersunk. I then used a globus plate and placed 16 mm variable angle screws into the vertebral bodies of each level C4 and C5  and locked them into position. The wound was irrigated with bacitracin solution, checked for hemostasis which was established and confirmed. Once meticulous hemostasis was achieved, we then proceeded with closure with the assistance of my nurse practitioner. The platysma was closed with interrupted 3-0 undyed Vicryl suture, the subcuticular layer was closed with interrupted 3-0 undyed Vicryl suture. The skin edges were approximated with steristrips. The drapes were removed. A sterile dressing was applied. The patient was then awakened from general anesthesia and transferred to the recovery room in stable condition. At the end of the procedure all sponge, needle and instrument counts were correct.   PLAN OF CARE: Admit for overnight observation  PATIENT DISPOSITION:  PACU - hemodynamically stable.   Delay start of Pharmacological VTE agent (>24hrs) due to surgical blood loss or risk of bleeding:  yes

## 2021-11-16 NOTE — Patient Instructions (Addendum)
Medication Instructions:  Your physician recommends that you continue on your current medications as directed. Please refer to the Current Medication list given to you today.  *If you need a refill on your cardiac medications before your next appointment, please call your pharmacy*   Lab Work: Fasting lipid and lft's in 3 months, same day or a few days prior to appointment If you have labs (blood work) drawn today and your tests are completely normal, you will receive your results only by: [place] (if you have MyChart) OR A paper copy in the mail If you have any lab test that is abnormal or we need to change your treatment, we will call you to review the results.   Follow-Up: At [facility], you and your health needs are our priority.  As part of our continuing mission to provide you with exceptional heart care, we have created designated Provider Care Teams.  These Care Teams include your primary Cardiologist (physician) and Advanced Practice Providers (APPs -  Physician Assistants and Nurse Practitioners) who all work together to provide you with the care you need, when you need it.  Your next appointment:   3 month(s) with fasting lipid and lft's same day or a few days prior  The format for your next appointment:   In Person  Provider:   Donato Heinz, MD    Important Information About Sugar

## 2021-11-17 DIAGNOSIS — M47812 Spondylosis without myelopathy or radiculopathy, cervical region: Secondary | ICD-10-CM | POA: Diagnosis not present

## 2021-11-17 LAB — GLUCOSE, CAPILLARY: Glucose-Capillary: 151 mg/dL — ABNORMAL HIGH (ref 70–99)

## 2021-11-17 MED ORDER — 0.9 % SODIUM CHLORIDE (POUR BTL) OPTIME
TOPICAL | Status: DC | PRN
  Administered 2021-11-16: 1000 mL

## 2021-11-17 MED ORDER — THROMBIN 5000 UNITS EX SOLR: OROMUCOSAL | Status: DC | PRN

## 2021-11-17 MED ORDER — CYCLOBENZAPRINE HCL 10 MG PO TABS: 10.0000 mg | ORAL_TABLET | Freq: Three times a day (TID) | ORAL | 0 refills | Status: DC

## 2021-11-17 MED ORDER — OXYCODONE-ACETAMINOPHEN 5-325 MG PO TABS: 1.0000 | ORAL_TABLET | ORAL | 0 refills | Status: DC | PRN

## 2021-11-17 NOTE — Discharge Summary (Signed)
Physician Discharge Summary  Patient ID: Wayne Pratt MRN: [id number] DOB/AGE: July 27, 1960 61 y.o.  Admit date: 11/16/2021 Discharge date: 11/17/2021  Admission Diagnoses:  Adjacent level spondylosis with adjacent stenosis C4-5 with neck pain and left arm pain    Discharge Diagnoses: same   Discharged Condition: good  Hospital Course: The patient was admitted on 11/16/2021 and taken to the operating room where the patient underwent acdf C4-5. The patient tolerated the procedure well and was taken to the recovery room and then to the floor in stable condition. The hospital course was routine. There were no complications. The wound remained clean dry and intact. Pt had appropriate neck soreness. No complaints of arm pain or new N/T/W. The patient remained afebrile with stable vital signs, and tolerated a regular diet. The patient continued to increase activities, and pain was well controlled with oral pain medications.   Consults: None  Significant Diagnostic Studies:  Results for orders placed or performed during the hospital encounter of 11/16/21  Glucose, capillary  Result Value Ref Range   Glucose-Capillary 182 (H) 70 - 99 mg/dL  Glucose, capillary  Result Value Ref Range   Glucose-Capillary 171 (H) 70 - 99 mg/dL  Glucose, capillary  Result Value Ref Range   Glucose-Capillary 104 (H) 70 - 99 mg/dL  Glucose, capillary  Result Value Ref Range   Glucose-Capillary 278 (H) 70 - 99 mg/dL   Comment 1 Notify RN    Comment 2 Document in Chart   Glucose, capillary  Result Value Ref Range   Glucose-Capillary 151 (H) 70 - 99 mg/dL   Comment 1 Notify RN    Comment 2 Document in Chart     DG Cervical Spine 1 View  Result Date: 11/16/2021 CLINICAL DATA:  Fluoroscopic assistance for cervical spine surgery EXAM: DG CERVICAL SPINE - 1 VIEW COMPARISON:  10/22/2021 FINDINGS: Fluoroscopic images show anterior surgical fusion at C4-C5 level. There is revision of anterior fusion at C5-C6 level.  Fluoroscopic time 12 seconds. Radiation dose 3.02 mGy. IMPRESSION: Fluoroscopic assistance was provided for anterior surgical fusion at C4-C5 level pain Electronically Signed   By: Elmer Picker M.D.   On: 11/16/2021 16:58   DG C-Arm 1-60 Min-No Report  Result Date: 11/16/2021 Fluoroscopy was utilized by the requesting physician.  No radiographic interpretation.   DG C-Arm 1-60 Min-No Report  Result Date: 11/16/2021 Fluoroscopy was utilized by the requesting physician.  No radiographic interpretation.    Antibiotics:  Anti-infectives (From admission, onward)    Start     Dose/Rate Route Frequency Ordered Stop   11/17/21 0600  vancomycin (VANCOCIN) IVPB 1000 mg/200 mL premix  Status:  Discontinued       Note to Pharmacy: Pharmacy consult for antibiotics please:  (Patient has hx of anaphylaxis) Due to a national shortage of IV Clindamycin, please consider using an alternative medication for the following indications   1,000 mg 200 mL/hr over 60 Minutes Intravenous On call to O.R. 11/16/21 1210 11/16/21 1226   11/16/21 2100  clindamycin (CLEOCIN) IVPB 600 mg        600 mg 100 mL/hr over 30 Minutes Intravenous  Once 11/16/21 1815 11/16/21 2020   11/16/21 1230  clindamycin (CLEOCIN) IVPB 600 mg        600 mg 100 mL/hr over 30 Minutes Intravenous 60 min pre-op 11/16/21 1225 11/16/21 1450       Discharge Exam: Blood pressure 120/79, pulse 82, temperature 98 F (36.7 C), temperature source Oral, resp. rate 16, height '6\' 1"'$  (  1.854 m), weight 95.7 kg, SpO2 100 %. Neurologic: Grossly normal Ambulating and voiding well incision cdi   Discharge Medications:   Allergies as of 11/17/2021       Reactions   Penicillins Anaphylaxis, Other (See Comments)   Has patient had a PCN reaction causing immediate rash, facial/tongue/throat swelling, SOB or lightheadedness with hypotension: Yes Has patient had a PCN reaction causing severe rash involving mucus membranes or skin necrosis: Yes Has  patient had a PCN reaction that required hospitalization: No Has patient had a PCN reaction occurring within the last 10 years: No If all of the above answers are "NO", then may proceed with Cephalosporin use.   Vancomycin Anaphylaxis   Ativan [lorazepam] Other (See Comments)   Violent and Mean    Baclofen         Medication List     STOP taking these medications    Oxycodone HCl 10 MG Tabs       TAKE these medications    acetaminophen 325 MG tablet Commonly known as: TYLENOL Take 325-650 mg by mouth every 6 (six) hours as needed for mild pain or moderate pain.   albuterol 108 (90 Base) MCG/ACT inhaler Commonly known as: VENTOLIN HFA Inhale 1-2 puffs into the lungs every 6 (six) hours as needed for wheezing or shortness of breath.   aspirin EC 81 MG tablet Take 1 tablet (81 mg total) by mouth daily at 6 (six) AM. Swallow whole.   clonazePAM 0.5 MG tablet Commonly known as: KLONOPIN Take 0.5 mg by mouth 2 (two) times daily.   cyclobenzaprine 10 MG tablet Commonly known as: FLEXERIL Take 1 tablet (10 mg total) by mouth 3 (three) times daily.   glipiZIDE XL 10 MG 24 hr tablet Generic drug: glipiZIDE Take 10 mg by mouth 2 (two) times daily.   Levemir FlexPen 100 UNIT/ML FlexPen Generic drug: insulin detemir Inject 15 Units into the skin daily as needed (High Blood Sugar).   lisinopril 5 MG tablet Commonly known as: ZESTRIL TAKE (1) TABLET BY MOUTH DAILY   lovastatin 40 MG tablet Commonly known as: MEVACOR Take 40 mg by mouth at bedtime.   Melatonin 10 MG Tabs Take 40 mg by mouth at bedtime.   metFORMIN 1000 MG tablet Commonly known as: GLUCOPHAGE Take 1,000 mg by mouth 2 (two) times daily with a meal.   oxyCODONE-acetaminophen 5-325 MG tablet Commonly known as: Percocet Take 1 tablet by mouth every 4 (four) hours as needed for severe pain.   pioglitazone 45 MG tablet Commonly known as: ACTOS Take 45 mg by mouth daily.        Disposition:  home   Final Dx: acdfc4-5  Discharge Instructions      Remove dressing in 72 hours   Complete by: As directed    Call MD for:  difficulty breathing, headache or visual disturbances   Complete by: As directed    Call MD for:  hives   Complete by: As directed    Call MD for:  persistant nausea and vomiting   Complete by: As directed    Call MD for:  redness, tenderness, or signs of infection (pain, swelling, redness, odor or green/yellow discharge around incision site)   Complete by: As directed    Call MD for:  severe uncontrolled pain   Complete by: As directed    Call MD for:  temperature >100.4   Complete by: As directed    Diet - low sodium heart healthy   Complete by:  As directed    Driving Restrictions   Complete by: As directed    No driving for 2 weeks, no riding in the car for 1 week   Increase activity slowly   Complete by: As directed    Lifting restrictions   Complete by: As directed    No lifting more than 8 lbs          Signed: Ocie Cornfield Wayne Pratt 11/17/2021, 7:49 AM

## 2021-11-17 NOTE — Progress Notes (Signed)
Patient alert and oriented, voiding adequately, skin clean, dry and intact without evidence of skin break down, or symptoms of complications - no redness or edema noted, only slight tenderness at site.  Patient states pain is manageable at time of discharge. Patient has an appointment with MD in 2 weeks 

## 2021-11-17 NOTE — Anesthesia Postprocedure Evaluation (Signed)
Anesthesia Post Note  Patient: Wayne Pratt  Procedure(s) Performed: CERVICAL FOUR-FIVE ANTERIOR CERVICAL DECOMPRESSION/DISCECTOMY FUSION WITH PLATE REMOVAL (Spine Cervical)     Patient location during evaluation: PACU Anesthesia Type: General Level of consciousness: awake and alert Pain management: pain level controlled Vital Signs Assessment: post-procedure vital signs reviewed and stable Respiratory status: spontaneous breathing, nonlabored ventilation and respiratory function stable Cardiovascular status: blood pressure returned to baseline Postop Assessment: no apparent nausea or vomiting Anesthetic complications: no   No notable events documented.           Marthenia Rolling

## 2021-11-20 ENCOUNTER — Encounter (HOSPITAL_COMMUNITY): Payer: Self-pay | Admitting: Neurological Surgery

## 2022-02-16 ENCOUNTER — Other Ambulatory Visit: Payer: Self-pay | Admitting: *Deleted

## 2022-02-16 DIAGNOSIS — I739 Peripheral vascular disease, unspecified: Secondary | ICD-10-CM

## 2022-02-16 DIAGNOSIS — I70229 Atherosclerosis of native arteries of extremities with rest pain, unspecified extremity: Secondary | ICD-10-CM

## 2022-02-22 NOTE — Progress Notes (Deleted)
Cardiology Office Note:    Date:  02/22/2022   ID:  Wayne Pratt, DOB 1960-04-01, MRN [id number]  PCP:  Luciano Cutter, DO  Cardiologist:  Donato Heinz, MD  Electrophysiologist:  None   Referring MD: No ref. provider found   No chief complaint on file.   History of Present Illness:    Wayne Pratt is a 62 y.o. male with a hx of diabetes, hypertension, hyperlipidemia who presents for follow-up.  Initially seen in clinic 01/22/2019.  He was seen in the ED on 01/17/2018 with chest pain radiating to his back.  CTA chest abdomen pelvis was done which showed no evidence of aortic dissection, but did show significant multivessel coronary calcifications.  High-sensitivity troponins were negative x3.  EKG did not show ischemic changes.  At initial clinic visit, coronary CTA was ordered but was not done.  Echocardiogram 06/18/2020 showed EF 50 to [id number], grade 1 diastolic dysfunction, normal RV function, no significant valvular disease.  Since last clinic visit,   Past Medical History:  Diagnosis Date   Anemia    Anxiety    Arthritis    Asthma    Cervical disc disease    Essential hypertension    History of kidney stones    History of pleural empyema    History of pneumonia    History of skin cancer    Hypertension    Insomnia    Migraine    Mixed hyperlipidemia    Nephrolithiasis    Neuropathy    PAD (peripheral artery disease) (HCC)    Sleep apnea    Stop Bang score of 7   Type 2 diabetes mellitus ([place])     Past Surgical History:  Procedure Laterality Date   ABDOMINAL AORTOGRAM W/LOWER EXTREMITY N/A 01/31/2020   Procedure: ABDOMINAL AORTOGRAM W/LOWER EXTREMITY;  Surgeon: Marty Heck, MD;  Location: [facility];  Service: Cardiovascular;  Laterality: N/A;   AMPUTATION Right 02/29/2020   Procedure: RIGHT TOE AMPUTATIONS, 1, 2, 5;  Surgeon: Marty Heck, MD;  Location: [place];  Service: Vascular;  Laterality: Right;   ANTERIOR CERVICAL  DECOMP/DISCECTOMY FUSION N/A 11/16/2021   Procedure: CERVICAL FOUR-FIVE ANTERIOR CERVICAL DECOMPRESSION/DISCECTOMY FUSION WITH PLATE REMOVAL;  Surgeon: Eustace Moore, MD;  Location: [place];  Service: Neurosurgery;  Laterality: N/A;   APPLICATION OF WOUND VAC Right 02/29/2020   Procedure: APPLICATION OF WOUND VAC;  Surgeon: Marty Heck, MD;  Location: [place];  Service: Vascular;  Laterality: Right;   APPLICATION OF WOUND VAC Right 04/16/2020   Procedure: APPLICATION OF WOUND VAC;  Surgeon: Serafina Mitchell, MD;  Location: [place];  Service: Vascular;  Laterality: Right;   BIOPSY  10/05/2011   Dr. Oneida Alar :non-erosive gastritis   BIOPSY  08/06/2014   Procedure: BIOPSY;  Surgeon: Danie Binder, MD;  Location: [facility];  Service: Endoscopy;;   CERVICAL DISC SURGERY     ELBOW SURGERY Right    ENDARTERECTOMY FEMORAL Right 02/04/2020   Procedure: RIGHT FEMORAL ENDARTERECTOMY with Profundaplasty;  Surgeon: Marty Heck, MD;  Location: [place];  Service: Vascular;  Laterality: Right;   ESOPHAGOGASTRODUODENOSCOPY  09/2011   Dr. Oneida Alar: chronic inactive gastritis    ESOPHAGOGASTRODUODENOSCOPY N/A 08/18/2017   Procedure: ESOPHAGOGASTRODUODENOSCOPY (EGD);  Surgeon: Milus Banister, MD;  Location: [facility];  Service: Endoscopy;  Laterality: N/A;   ESOPHAGOGASTRODUODENOSCOPY N/A 03/09/2018   Procedure: ESOPHAGOGASTRODUODENOSCOPY (EGD);  Surgeon: Milus Banister, MD;  Location: [facility];  Service: Endoscopy;  Laterality:  N/A;   ESOPHAGOGASTRODUODENOSCOPY (EGD) WITH PROPOFOL N/A 08/06/2014   mild non-erosive gastritis, duodenitis   EUS N/A 08/18/2017   Procedure: UPPER ENDOSCOPIC ULTRASOUND (EUS) RADIAL;  Surgeon: Milus Banister, MD;  Location: [facility];  Service: Endoscopy;  Laterality: N/A;   EUS N/A 03/09/2018   Procedure: UPPER ENDOSCOPIC ULTRASOUND (EUS) RADIAL;  Surgeon: Milus Banister, MD;  Location: [facility];  Service: Endoscopy;  Laterality: N/A;   EYE SURGERY Right     fixed tear in the cornea with laser   FEMORAL-POPLITEAL BYPASS GRAFT Right 02/04/2020   Procedure: BYPASS GRAFT FEMORAL-POPLITEAL ARTERY With Vein Harvest;  Surgeon: Marty Heck, MD;  Location: [place];  Service: Vascular;  Laterality: Right;   FINE NEEDLE ASPIRATION  03/09/2018   Procedure: FINE NEEDLE ASPIRATION;  Surgeon: Milus Banister, MD;  Location: [facility];  Service: Endoscopy;;   FLEXIBLE SIGMOIDOSCOPY  10/05/2011   Dr. Fields:poor prep   I & D EXTREMITY Right 04/16/2020   Procedure: RIGHT CALF WOUND IRRIGATION, DEBRIDEMENT AND GRAFTING;  Surgeon: Serafina Mitchell, MD;  Location: [place];  Service: Vascular;  Laterality: Right;   LUNG SURGERY  2010   for empyema   PATCH ANGIOPLASTY Right 02/04/2020   Procedure: PATCH ANGIOPLASTY;  Surgeon: Marty Heck, MD;  Location: [place];  Service: Vascular;  Laterality: Right;   SPINAL CORD STIMULATOR IMPLANT  08/29/2019   [employer] wavewriter alpha [phone]   SPINAL CORD STIMULATOR INSERTION N/A 08/01/2015   Procedure: Spinal Cord Stimulator Implant;  Surgeon: Eustace Moore, MD;  Location: [facility];  Service: Neurosurgery;  Laterality: N/A;   TONSILLECTOMY     VASECTOMY Bilateral 1995   VEIN HARVEST Right 02/04/2020   Procedure: VEIN HARVEST Greater Saphenous Vein;  Surgeon: Marty Heck, MD;  Location: [place];  Service: Vascular;  Laterality: Right;   WOUND DEBRIDEMENT Right 02/29/2020   Procedure: RIGHT CALF DEBRIDEMENT;  Surgeon: Marty Heck, MD;  Location: [facility];  Service: Vascular;  Laterality: Right;    Current Medications: No outpatient medications have been marked as taking for the 02/23/22 encounter (Appointment) with Donato Heinz, MD.     Allergies:   Penicillins, Vancomycin, Ativan [lorazepam], and Baclofen   Social History   Socioeconomic History   Marital status: Married    Spouse name: Not on file   Number of children: 2   Years of education: Not on file    Highest education level: Not on file  Occupational History   Occupation: disabled    Comment: 2005  Tobacco Use   Smoking status: Every Day    Packs/day: 1.00    Years: 39.00    Total pack years: 39.00    Types: Cigarettes    Last attempt to quit: 02/04/2020    Years since quitting: 2.0   Smokeless tobacco: Never  Vaping Use   Vaping Use: Never used  Substance and Sexual Activity   Alcohol use: No    Alcohol/week: 0.0 standard drinks of alcohol   Drug use: No   Sexual activity: Yes    Birth control/protection: None  Other Topics Concern   Not on file  Social History Narrative   Not on file   Social Determinants of Health   Financial Resource Strain: Not on file  Food Insecurity: Not on file  Transportation Needs: Not on file  Physical Activity: Not on file  Stress: Not on file  Social Connections: Not on file     Family History: The  patient's family history includes COPD in his maternal grandmother; Cancer in his paternal grandmother; Heart attack in his father; Prostate cancer in an other family member. There is no history of Colon cancer.  ROS:   Please see the history of present illness.     All other systems reviewed and are negative.  EKGs/Labs/Other Studies Reviewed:    The following studies were reviewed today:   EKG:  EKG is  ordered today.  The ekg ordered today demonstrates normal sinus rhythm, rate 90, no ST/T abnormalities  Recent Labs: 11/13/2021: BUN 15; Creatinine, Ser 0.92; Hemoglobin 12.2; Platelets 310; Potassium 4.3; Sodium 138  Recent Lipid Panel    Component Value Date/Time   CHOL 133 02/05/2020 0217   TRIG 112 02/05/2020 0217   HDL 37 (L) 02/05/2020 0217   CHOLHDL 3.6 02/05/2020 0217   VLDL 22 02/05/2020 0217   LDLCALC 74 02/05/2020 0217    Physical Exam:    VS:  There were no vitals taken for this visit.    Wt Readings from Last 3 Encounters:  11/16/21 211 lb (95.7 kg)  11/16/21 212 lb (96.2 kg)  11/13/21 215 lb 12.8 oz  (97.9 kg)     GEN:  Well nourished, well developed in no acute distress HEENT: Normal NECK: No JVD; No carotid bruits LYMPHATICS: No lymphadenopathy CARDIAC: RRR, no murmurs, rubs, gallops RESPIRATORY:  Clear to auscultation without rales, wheezing or rhonchi  ABDOMEN: Soft, non-tender, non-distended MUSCULOSKELETAL:  No edema; No deformity  SKIN: Warm and dry NEUROLOGIC:  Alert and oriented x 3 PSYCHIATRIC:  Normal affect   ASSESSMENT:    No diagnosis found.  PLAN:    Chest pain: Atypical in description, as describes substernal pressure not related to stress or exertion.  However he does have significant risk factors for coronary artery disease, and CTA chest showed multivessel coronary calcifications.  Echocardiogram 06/18/2020 showed EF 50 to XX123456, grade 1 diastolic dysfunction, normal RV function, no significant valvular disease. -Coronary CTA was ordered but he did not have done  Hypertension: On lisinopril 5 mg daily.    Hyperlipidemia: On lovastatin 40 mg daily.    Type 2 diabetes: ***A1c 7.6%.  On insulin  RTC in 3 months   Medication Adjustments/Labs and Tests Ordered: Current medicines are reviewed at length with the patient today.  Concerns regarding medicines are outlined above.  No orders of the defined types were placed in this encounter.  No orders of the defined types were placed in this encounter.   There are no Patient Instructions on file for this visit.   Signed, Donato Heinz, MD  02/22/2022 11:47 PM    [place]

## 2022-02-23 ENCOUNTER — Ambulatory Visit: Payer: PPO | Attending: Cardiology | Admitting: Cardiology

## 2022-02-25 ENCOUNTER — Encounter (HOSPITAL_BASED_OUTPATIENT_CLINIC_OR_DEPARTMENT_OTHER): Payer: PPO | Attending: Internal Medicine | Admitting: Internal Medicine

## 2022-02-25 DIAGNOSIS — I739 Peripheral vascular disease, unspecified: Secondary | ICD-10-CM | POA: Insufficient documentation

## 2022-02-25 DIAGNOSIS — E1142 Type 2 diabetes mellitus with diabetic polyneuropathy: Secondary | ICD-10-CM | POA: Insufficient documentation

## 2022-02-25 DIAGNOSIS — Z86711 Personal history of pulmonary embolism: Secondary | ICD-10-CM | POA: Insufficient documentation

## 2022-02-25 DIAGNOSIS — E11628 Type 2 diabetes mellitus with other skin complications: Secondary | ICD-10-CM | POA: Insufficient documentation

## 2022-02-25 DIAGNOSIS — F17200 Nicotine dependence, unspecified, uncomplicated: Secondary | ICD-10-CM | POA: Diagnosis not present

## 2022-02-25 DIAGNOSIS — Z89421 Acquired absence of other right toe(s): Secondary | ICD-10-CM | POA: Diagnosis not present

## 2022-02-25 DIAGNOSIS — Z872 Personal history of diseases of the skin and subcutaneous tissue: Secondary | ICD-10-CM | POA: Insufficient documentation

## 2022-02-25 DIAGNOSIS — Z7901 Long term (current) use of anticoagulants: Secondary | ICD-10-CM | POA: Insufficient documentation

## 2022-02-27 NOTE — Progress Notes (Signed)
[name], [name] ([id number]) [id number] Page 1 of 6 Visit Report for 02/25/2022 Allergy List Details Patient Name: Date of Service: Wayne Pratt, [place]. 02/25/2022 8:00 A M Medical Record Number: [id number] Patient Account Number: [id number] Date of Birth/Sex: Treating RN: [birth year] (62 y.o. [name] Primary Care Moana Munford: PA TEL, HA RSHA L Other Clinician: Referring Macall Mccroskey: Treating Read Bonelli/Extender: Kalman Shan PA TEL, HA RSHA L Weeks in Treatment: 0 Allergies Active Allergies penicillin vancomycin Ativan Allergy Notes Electronic Signature(s) Signed: 02/26/2022 12:08:46 PM By: [name] RN Entered By: [name] on 02/24/2022 16:18:58 -------------------------------------------------------------------------------- Arrival Information Details Patient Name: Date of Service: Wayne Pratt, [name] 02/25/2022 8:00 A M Medical Record Number: [id number] Patient Account Number: [id number] Date of Birth/Sex: Treating RN: 07/10/1960 (62 y.o. [name], [name] Primary Care Torryn Hudspeth: PA TEL, HA RSHA L Other Clinician: Referring Avaiyah Strubel: Treating Idelia Caudell/Extender: Kalman Shan PA TEL, HA RSHA L Weeks in Treatment: 0 Visit Information Patient Arrived: Ambulatory Arrival Time: 08:39 Accompanied By: [id number] Transfer Assistance: None Patient Identification Verified: Yes Secondary Verification Process Completed: Yes Patient Requires Transmission-Based Precautions: No Patient Has Alerts: Yes Patient Alerts: Patient on Blood Thinner R ABI: 1/23 07/23 History Since Last Visit Added or deleted any medications: No Any new allergies or adverse reactions: No Had a fall or experienced change in activities of daily living that may affect risk of falls: No Signs or symptoms of abuse/neglect since last visito No Hospitalized since last visit: No Implantable device outside of the clinic excluding cellular tissue based products placed in the center  since last visit: No Has Dressing in Place as Prescribed: Yes Electronic Signature(s) Signed: 02/26/2022 12:08:46 PM By: [name] RN Entered By: [name] on 02/25/2022 08:45:19 [name] ([id number]) [id number] Page 2 of 6 -------------------------------------------------------------------------------- Clinic Level of Care Assessment Details Patient Name: Date of Service: Wayne Pratt, [place]. 02/25/2022 8:00 A M Medical Record Number: [id number] Patient Account Number: [id number] Date of Birth/Sex: Treating RN: August 23, 1960 (63 y.o. [name], [name] Primary Care Raif Chachere: PA TEL, HA RSHA L Other Clinician: Referring Louiza Moor: Treating Adean Milosevic/Extender: Kalman Shan PA TEL, HA RSHA L Weeks in Treatment: 0 Clinic Level of Care Assessment Items TOOL 4 Quantity Score X- 1 0 Use when only an EandM is performed on FOLLOW-UP visit ASSESSMENTS - Nursing Assessment / Reassessment X- 1 10 Reassessment of Co-morbidities (includes updates in patient status) X- 1 5 Reassessment of Adherence to Treatment Plan ASSESSMENTS - Wound and Skin A ssessment / Reassessment []$  - 0 Simple Wound Assessment / Reassessment - one wound []$  - 0 Complex Wound Assessment / Reassessment - multiple wounds []$  - 0 Dermatologic / Skin Assessment (not related to wound area) ASSESSMENTS - Focused Assessment []$  - 0 Circumferential Edema Measurements - multi extremities []$  - 0 Nutritional Assessment / Counseling / Intervention []$  - 0 Lower Extremity Assessment (monofilament, tuning fork, pulses) []$  - 0 Peripheral Arterial Disease Assessment (using hand held doppler) ASSESSMENTS - Ostomy and/or Continence Assessment and Care []$  - 0 Incontinence Assessment and Management []$  - 0 Ostomy Care Assessment and Management (repouching, etc.) PROCESS - Coordination of Care X - Simple Patient / Family Education for ongoing care 1 15 []$  - 0 Complex (extensive) Patient /  Family Education for ongoing care X- 1 10 Staff obtains Programmer, systems, Records, T Results / Process Orders est []$  - 0 Staff telephones HHA, Nursing Homes / Clarify orders / etc []$  - 0 Routine Transfer to another Facility (non-emergent condition) []$  - 0 Routine  Hospital Admission (non-emergent condition) X- 1 15 New Admissions / Biomedical engineer / Ordering NPWT Apligraf, etc. , []$  - 0 Emergency Hospital Admission (emergent condition) X- 1 10 Simple Discharge Coordination []$  - 0 Complex (extensive) Discharge Coordination PROCESS - Special Needs []$  - 0 Pediatric / Minor Patient Management []$  - 0 Isolation Patient Management []$  - 0 Hearing / Language / Visual special needs []$  - 0 Assessment of Community assistance (transportation, D/C planning, etc.) []$  - 0 Additional assistance / Altered mentation []$  - 0 Support Surface(s) Assessment (bed, cushion, seat, etc.) INTERVENTIONS - Wound Cleansing / Measurement MARWAN, KISSELL ([id number]) [id number] Page 3 of 6 []$  - 0 Simple Wound Cleansing - one wound []$  - 0 Complex Wound Cleansing - multiple wounds []$  - 0 Wound Imaging (photographs - any number of wounds) []$  - 0 Wound Tracing (instead of photographs) []$  - 0 Simple Wound Measurement - one wound []$  - 0 Complex Wound Measurement - multiple wounds INTERVENTIONS - Wound Dressings []$  - 0 Small Wound Dressing one or multiple wounds []$  - 0 Medium Wound Dressing one or multiple wounds []$  - 0 Large Wound Dressing one or multiple wounds []$  - 0 Application of Medications - topical []$  - 0 Application of Medications - injection INTERVENTIONS - Miscellaneous []$  - 0 External ear exam []$  - 0 Specimen Collection (cultures, biopsies, blood, body fluids, etc.) []$  - 0 Specimen(s) / Culture(s) sent or taken to Lab for analysis []$  - 0 Patient Transfer (multiple staff / Civil Service fast streamer / Similar devices) []$  - 0 Simple Staple / Suture removal (25 or less) []$   - 0 Complex Staple / Suture removal (26 or more) []$  - 0 Hypo / Hyperglycemic Management (close monitor of Blood Glucose) []$  - 0 Ankle / Brachial Index (ABI) - do not check if billed separately X- 1 5 Vital Signs Has the patient been seen at the hospital within the last three years: Yes Total Score: 70 Level Of Care: New/Established - Level 2 Electronic Signature(s) Signed: 02/26/2022 12:08:46 PM By: [name] RN Entered By: [name] on 02/25/2022 09:05:19 -------------------------------------------------------------------------------- Encounter Discharge Information Details Patient Name: Date of Service: Wayne Pratt, [name] 02/25/2022 8:00 A M Medical Record Number: [id number] Patient Account Number: [id number] Date of Birth/Sex: Treating RN: Jun 07, 1960 (62 y.o. [name] Primary Care Dinorah Masullo: PA TEL, HA RSHA L Other Clinician: Referring Dashley Monts: Treating Tel Hevia/Extender: Kalman Shan PA TEL, HA RSHA L Weeks in Treatment: 0 Encounter Discharge Information Items Discharge Condition: Stable Ambulatory Status: Ambulatory Discharge Destination: Home Transportation: Private Auto Accompanied By: wife Schedule Follow-up Appointment: Yes Clinical Summary of Care: Patient Declined Electronic Signature(s) Signed: 02/26/2022 12:08:46 PM By: [name] RN [name] ([id number]) [id number] Page 4 of 6 Entered By: [name] on 02/25/2022 09:06:03 -------------------------------------------------------------------------------- Lower Extremity Assessment Details Patient Name: Date of Service: Wayne Pratt, [place]. 02/25/2022 8:00 A M Medical Record Number: [id number] Patient Account Number: [id number] Date of Birth/Sex: Treating RN: 1960-06-23 (62 y.o. [name] Primary Care Amyria Komar: PA TEL, HA Joycelyn Schmid Other Clinician: Referring Iysha Mishkin: Treating Letina Luckett/Extender: Kalman Shan PA TEL, HA RSHA L Weeks in  Treatment: 0 Notes no wounds Electronic Signature(s) Signed: 02/26/2022 12:08:46 PM By: [name] RN Entered By: [name] on 02/25/2022 08:41:10 -------------------------------------------------------------------------------- Multi Wound Chart Details Patient Name: Date of Service: [name], [name] 02/25/2022 8:00 A M Medical Record Number: [id number] Patient Account Number: [id number] Date of Birth/Sex: Treating RN: 06-24-1960 (62 y.o. M) Primary Care Katerra Ingman: PA  TEL, HA RSHA L Other Clinician: Referring Marisal Swarey: Treating Angeleen Horney/Extender: Kalman Shan PA TEL, HA RSHA L Weeks in Treatment: 0 Vital Signs Height(in): Pulse(bpm): 80 Weight(lbs): Blood Pressure(mmHg): 139/78 Body Mass Index(BMI): Temperature(F): 98.1 Respiratory Rate(breaths/min): 17 [Treatment Notes:Wound Assessments Treatment Notes] Electronic Signature(s) Signed: 02/25/2022 12:26:40 PM By: Kalman Shan DO Entered By: Kalman Shan on 02/25/2022 09:36:54 -------------------------------------------------------------------------------- Multi-Disciplinary Care Plan Details Patient Name: Date of Service: Wayne Pratt, [name] 02/25/2022 8:00 A M Medical Record Number: [id number] Patient Account Number: [id number] Date of Birth/Sex: Treating RN: Nov 12, 1960 (62 y.o. [name] Primary Care Bahja Bence: PA TEL, HA RSHA L Other Clinician: Referring Sabino Denning: Treating Evee Liska/Extender: Kalman Shan PA TEL, HA RSHA L Weeks in Treatment: 0 SINCEAR, TANTON ([id number]) [id number] Page 5 of 6 Active Inactive Orientation to the Wound Care Program Nursing Diagnoses: Knowledge deficit related to the wound healing center program Goals: Patient/caregiver will verbalize understanding of the [place] Date Initiated: 02/25/2022 Target Resolution Date: 03/20/2022 Goal Status: Active Interventions: Provide education on orientation to the wound  center Notes: Electronic Signature(s) Signed: 02/26/2022 12:08:46 PM By: [name] RN Entered By: [name] on 02/25/2022 08:41:40 -------------------------------------------------------------------------------- Pain Assessment Details Patient Name: Date of Service: Wayne Pratt, [name] 02/25/2022 8:00 A M Medical Record Number: [id number] Patient Account Number: [id number] Date of Birth/Sex: Treating RN: November 18, 1960 (62 y.o. [name] Primary Care Lutricia Widjaja: PA TEL, HA RSHA L Other Clinician: Referring Steffan Caniglia: Treating Syler Norcia/Extender: Kalman Shan PA TEL, HA RSHA L Weeks in Treatment: 0 Active Problems Location of Pain Severity and Description of Pain Patient Has Paino No Site Locations Pain Management and Medication Current Pain Management: Electronic Signature(s) Signed: 02/26/2022 12:08:46 PM By: [name] RN Entered By: [name] on 02/25/2022 08:41:17 [name] ([id number]) [id number] Page 6 of 6 -------------------------------------------------------------------------------- Patient/Caregiver Education Details Patient Name: Date of Service: [name], [name] 2/15/2024andnbsp8:00 A M Medical Record Number: [id number] Patient Account Number: [id number] Date of Birth/Gender: Treating RN: Jun 15, 1960 (62 y.o. [name] Primary Care Physician: PA TEL, HA RSHA L Other Clinician: Referring Physician: Treating Physician/Extender: Kalman Shan PA TEL, HA RSHA L Weeks in Treatment: 0 Education Assessment Education Provided To: Patient and Caregiver Education Topics Provided Wound/Skin Impairment: Methods: Explain/Verbal Responses: State content correctly Electronic Signature(s) Signed: 02/26/2022 12:08:46 PM By: [name] RN Entered By: [name] on 02/25/2022 08:41:48 -------------------------------------------------------------------------------- Vitals Details Patient  Name: Date of Service: [name], [name] 02/25/2022 8:00 A M Medical Record Number: [id number] Patient Account Number: [id number] Date of Birth/Sex: Treating RN: 1960-06-29 (62 y.o. [name], [name] Primary Care Naythan Douthit: PA TEL, HA RSHA L Other Clinician: Referring Joan Avetisyan: Treating Janin Kozlowski/Extender: Kalman Shan PA TEL, HA RSHA L Weeks in Treatment: 0 Vital Signs Time Taken: 08:25 Temperature (F): 98.1 Pulse (bpm): 80 Respiratory Rate (breaths/min): 17 Blood Pressure (mmHg): 139/78 Reference Range: 80 - 120 mg / dl Electronic Signature(s) Signed: 02/26/2022 12:08:46 PM By: [name] RN Entered By: [name] on 02/25/2022 08:25:52

## 2022-02-27 NOTE — Progress Notes (Signed)
JARRION, BURGESON ([id number]) [phone] [id number] Page 1 of 4 Visit Report for 02/25/2022 Abuse Risk Screen Details Patient Name: Date of Service: Wayne Pratt, [place]. 02/25/2022 8:00 A M Medical Record Number: [id number] Patient Account Number: [id number] Date of Birth/Sex: Treating RN: 1960/07/27 (62 y.o. [name] Primary Care Carmyn Hamm: PA TEL, HA RSHA L Other Clinician: Referring Leonna Schlee: Treating Divine Hansley/Extender: Kalman Shan PA TEL, HA RSHA L Weeks in Treatment: 0 Abuse Risk Screen Items Answer ABUSE RISK SCREEN: Has anyone close to you tried to hurt or harm you recentlyo No Do you feel uncomfortable with anyone in your familyo No Has anyone forced you do things that you didnt want to doo No Electronic Signature(s) Signed: 02/26/2022 12:08:46 PM By: [name] RN Entered By: [name] on 02/25/2022 08:39:54 -------------------------------------------------------------------------------- Activities of Daily Living Details Patient Name: Date of Service: Wayne Pratt RY W. 02/25/2022 8:00 A M Medical Record Number: [id number] Patient Account Number: [id number] Date of Birth/Sex: Treating RN: 22-Sep-1960 (62 y.o. [name] Primary Care Osher Oettinger: PA TEL, HA RSHA L Other Clinician: Referring Trayce Caravello: Treating Lonetta Blassingame/Extender: Kalman Shan PA TEL, HA RSHA L Weeks in Treatment: 0 Activities of Daily Living Items Answer Activities of Daily Living (Please select one for each item) Drive Automobile Completely Able T Medications ake Completely Able Use T elephone Completely Able Care for Appearance Completely Able Use T oilet Completely Able Bath / Shower Completely Able Dress Self Completely Able Feed Self Completely Able Walk Completely Able Get In / Out Bed Completely Able Housework Completely Able Prepare Meals Completely Able Handle Money Completely Able Shop for Self Completely Able Electronic  Signature(s) Signed: 02/26/2022 12:08:46 PM By: [name] RN Entered By: [name] on 02/25/2022 08:40:09 [name] ([id number]) [id number] Page 2 of 4 -------------------------------------------------------------------------------- Education Screening Details Patient Name: Date of Service: Wayne GG, [place]. 02/25/2022 8:00 A M Medical Record Number: [id number] Patient Account Number: [id number] Date of Birth/Sex: Treating RN: December 11, 1960 (61 y.o. [name] Primary Care [name]: PA TEL, HA RSHA L Other Clinician: Referring [name]: Treating [name]/Extender: Kalman Shan PA TEL, HA RSHA L Weeks in Treatment: 0 Primary Learner Assessed: Patient Learning Preferences/Education Level/Primary Language Learning Preference: Explanation, Demonstration, Communication Board, Printed Material Highest Education Level: High School Preferred Language: Diplomatic Services operational officer Language Barrier: No Translator Needed: No Memory Deficit: No Emotional Barrier: No Cultural/Religious Beliefs Affecting Medical Care: No Physical Barrier Impaired Vision: Yes Glasses Impaired Hearing: No Decreased Hand dexterity: No Knowledge/Comprehension Knowledge Level: High Comprehension Level: High Ability to understand written instructions: High Ability to understand verbal instructions: High Motivation Anxiety Level: Calm Cooperation: Cooperative Education Importance: Denies Need Interest in Health Problems: Asks Questions Perception: Coherent Willingness to Engage in Self-Management High Activities: Readiness to Engage in Self-Management High Activities: Electronic Signature(s) Signed: 02/26/2022 12:08:46 PM By: [name] RN Entered By: [name] on 02/25/2022 08:40:27 -------------------------------------------------------------------------------- Fall Risk Assessment Details Patient Name: Date of Service: Wayne Pratt  RY W. 02/25/2022 8:00 A M Medical Record Number: [id number] Patient Account Number: [id number] Date of Birth/Sex: Treating RN: 09-22-1960 (62 y.o. [name] Primary Care Kypton Eltringham: PA TEL, HA RSHA L Other Clinician: Referring [name]: Treating [name]/Extender: Kalman Shan PA TEL, HA RSHA L Weeks in Treatment: 0 Fall Risk Assessment Items Have you had 2 or more falls in the last [name] monthso 0 No KION, Pratt ([id number]) [phone] [id number] Page 3 of 4 Have you had any fall that resulted in injury in the last 12  monthso 0 No FALLS RISK SCREEN History of falling - immediate or within 3 months 0 No Secondary diagnosis (Do you have 2 or more medical diagnoseso) 0 No Ambulatory aid None/bed rest/wheelchair/nurse 0 No Crutches/cane/walker 0 No Furniture 0 No Intravenous therapy Access/Saline/Heparin Lock 0 No Gait/Transferring Normal/ bed rest/ wheelchair 0 No Weak (short steps with or without shuffle, stooped but able to lift head while walking, may seek 0 No support from furniture) Impaired (short steps with shuffle, may have difficulty arising from chair, head down, impaired 0 No balance) Mental Status Oriented to own ability 0 No Electronic Signature(s) Signed: 02/26/2022 12:08:46 PM By: [name] RN Entered By: [name] on 02/25/2022 08:40:34 -------------------------------------------------------------------------------- Foot Assessment Details Patient Name: Date of Service: Wayne Pratt RY W. 02/25/2022 8:00 A M Medical Record Number: [id number] Patient Account Number: [id number] Date of Birth/Sex: Treating RN: December 06, 1960 (62 y.o. [name], [name] Primary Care [name]: PA TEL, HA RSHA L Other Clinician: Referring [name]: Treating [name]/Extender: Kalman Shan PA TEL, HA RSHA L Weeks in Treatment: 0 Foot Assessment Items Site Locations + = Sensation present, - = Sensation absent, C = Callus, U = Ulcer R =  Redness, W = Warmth, M = Maceration, PU = Pre-ulcerative lesion F = Fissure, S = Swelling, D = Dryness Assessment Right: Left: Other Deformity: No No Prior Foot Ulcer: No No Prior Amputation: No No Charcot Joint: No No Ambulatory Status: Ambulatory Without Help GaitHENDRIK, KOR ([id number]) [phone] [id number] Page 4 of 4 Electronic Signature(s) Signed: 02/26/2022 12:08:46 PM By: [name] RN Entered By: [name] on 02/25/2022 08:41:00 -------------------------------------------------------------------------------- Nutrition Risk Screening Details Patient Name: Date of Service: Wayne Pratt RY W. 02/25/2022 8:00 A M Medical Record Number: [id number] Patient Account Number: [id number] Date of Birth/Sex: Treating RN: 03-07-1960 (62 y.o. [name] Primary Care Fleurette Woolbright: PA TEL, HA RSHA L Other Clinician: Referring Charly Hunton: Treating Stedman [place]/Extender: Kalman Shan PA TEL, HA RSHA L Weeks in Treatment: 0 Height (in): Weight (lbs): Body Mass Index (BMI): Nutrition Risk Screening Items Score Screening NUTRITION RISK SCREEN: I have an illness or condition that made me change the kind and/or amount of food I eat 0 No I eat fewer than two meals per day 0 No I eat few fruits and vegetables, or milk products 0 No I have three or more drinks of beer, liquor or wine almost every day 0 No I have tooth or mouth problems that make it hard for me to eat 0 No I don't always have enough money to buy the food I need 0 No I eat alone most of the time 0 No I take three or more different prescribed or over-the-counter drugs a day 0 No Without wanting to, I have lost or gained 10 pounds in the last six months 0 No I am not always physically able to shop, cook and/or feed myself 0 No Nutrition Protocols Good Risk Protocol 0 No interventions needed Moderate Risk Protocol High Risk Proctocol Risk Level: Good Risk Score: 0 Electronic  Signature(s) Signed: 02/26/2022 12:08:46 PM By: [name] RN Entered By: [name] on 02/25/2022 08:40:40

## 2022-02-27 NOTE — Progress Notes (Signed)
THAMAS, KROENING ([id number]) [id number] Page 1 of 8 Visit Report for 02/25/2022 Chief Complaint Document Details Patient Name: Date of Service: [name], [place]. 02/25/2022 8:00 A M Medical Record Number: [id number] Patient Account Number: [id number] Date of Birth/Sex: Treating RN: 13-Nov-1960 (62 y.o. M) Primary Care Provider: PA TEL, HA RSHA L Other Clinician: Referring Provider: Treating Provider/Extender: Kalman Shan PA TEL, HA RSHA L Weeks in Treatment: 0 Information Obtained from: Patient Chief Complaint 07/04/2021; patient arrives for review of wound on the right posterior calf 02/25/2022; history of wound to the right posterior calf with concern of reopening Electronic Signature(s) Signed: 02/25/2022 12:26:40 PM By: Kalman Shan DO Entered By: Kalman Shan on 02/25/2022 09:37:21 -------------------------------------------------------------------------------- HPI Details Patient Name: Date of Service: [name], [name] 02/25/2022 8:00 A M Medical Record Number: [id number] Patient Account Number: [id number] Date of Birth/Sex: Treating RN: 1960-03-21 (61 y.o. M) Primary Care Provider: PA TEL, HA RSHA L Other Clinician: Referring Provider: Treating Provider/Extender: Kalman Shan PA TEL, HA RSHA L Weeks in Treatment: 0 History of Present Illness HPI Description: ADMISSION 07/04/2020 This is a 62 year old man who presented to Dr. Carlis Abbott in January of this year having critical limb ischemia. First angiogram was on 01/31/2020 showed proximal profound disease in the SFA with right foot runoff via diseased posterior tibia. He underwent a right femoral endarterectomy with a right common femoral to below-knee popliteal bypass on 02/04/2020. There were generally good vascular results however the day after the surgery according to his wife he developed a black eschar at presumably ischemic spot on the right posterior calf. He also ultimately had amputations  on 02/29/2020 of the right first second and fifth toes in March she had a wound VAC on the right calf but this was too painful and was switched to wet-to-dry. At 1 point this clearly had exposed muscle and the wife was able to document this by showing me short movie on his phone. He had an operative debridement on the right calf in early April with a skin substitute o Myriad. In any case none of this really made any difference. He was hospitalized early in June with bilateral pulmonary emboli and is now on Eliquis. Long medical problem list but predominantly type 2 diabetes with peripheral neuropathy, spinal stenosis. He is a smoker Most recent arterial studies were done on 03/18/2020 on the right this showed a an ABI in the posterior tibial of 1.05 with biphasic waveforms on the left noncompressible at 1.38 but with a great toe pressure near normal at 1.11 again with triphasic waveforms. 6/29; patient arrives back in clinic. We use Santyl on the posterior calf wound on the right. Arrives in clinic with this looking a lot better. Healthy red granulated tissue over the majority of this. 7/7 the wound is cleaned up quite nicely I think it is time to change from Santyl to [facility]. They are using a local wrap here. There is edema in his upper lower leg. 7/21; using Hydrofera Blue. Nice improvement in surface area they are changing this every 2d 8/18; using Hydrofera Blue and bordered foam. Nice improvement in surface area 9/1; patient has some degree of lymphedema in the right leg we have not been wrapping him using Hydrofera Blue and bordered foam making decent progress in surface area. T oday he arrives with some erythema below the wound scaly skin this there is some form of contact dermatitis perhaps stasis dermatitis. I am not thinking that this is actually bacterial cellulitis  as it is neither warm nor tender. The wound itself actually continues to look quite good 9/6; the patient's lymphedema  in the right leg is considerably improved with compression. Also result of this the wound area is much better. We have been using Hydrofera Blue now under compression 9/14; still good edema control. Wound is smaller we have been using Hydrofera Blue for a prolonged period of time 9/20; still good edema control. Wound is slightly smaller but with a much healthier surface I changed to Iodoflex last week still under compression 9/30; edema control is good. Wound is smaller very healthy surface. I changed back to silver collagen today NICHOLUS, FERGUS ([id number]) [id number] Page 2 of 8 10/6; edema control is adequate with 4-layer I am using silver collagen the wound measures smaller. Still some eschar and flaking skin around the margins 10/13; edema control is adequate. He had eschar over the wound area. Although they thought this might be closed when we remove the eschar there is still an open area. Therefore we will redress this and rewrap 10/20; edema control is good. He still does not have his stockings again the area is heavily covered in eschar. I gently remove this with a #5 curette I did not see anything precisely open here. However small draining area could easily do this. We are going to change him to polymen still under the same compression while he orders stockings 10/27 he has a stockings today however dry flaky skin covering over a wound area that is not closed. This is disappointing. Using polymen under compression he has good edema control. This is worse than last week when I thought we did not have anything open 11/2; came in early because of pain in his feet caused by the wrap. For some reason they only called yesterday and he arrives today. fortunately nothing really major. He got relief when we took the wrap off. The wound has some surface eschar but I cannot identify any open wound 11/9; comes in today with a wound on the right lateral calf closed. This was  initially an area that developed after revascularization. Potentially embolic. He has compression stockings 02/25/2022 Mr. Myshawn Forbush is a 62 year old male with a past medical history of peripheral arterial disease status post femoral endarterectomy, type 2 diabetes with peripheral neuropathy that presents to the clinic due to history of right posterior leg wound. He was last seen in our clinic on 11/19/2020 and and during his admission he was treated for a wound to the right posterior calf. This was healed on discharge. T oday he presents with concerns that he reopen the wound. He had some white buildup to the area but once this was wiped away it was clear that this area was epithelialized with no open wound. He wears compression stockings daily. Electronic Signature(s) Signed: 02/25/2022 12:26:40 PM By: Kalman Shan DO Entered By: Kalman Shan on 02/25/2022 09:42:31 -------------------------------------------------------------------------------- Physical Exam Details Patient Name: Date of Service: [name], [name] 02/25/2022 8:00 A M Medical Record Number: [id number] Patient Account Number: [id number] Date of Birth/Sex: Treating RN: [birth year] (62 y.o. M) Primary Care Provider: PA TEL, HA RSHA L Other Clinician: Referring Provider: Treating Provider/Extender: Kalman Shan PA TEL, HA RSHA L Weeks in Treatment: 0 Constitutional respirations regular, non-labored and within target range for patient.. Cardiovascular 2+ dorsalis pedis/posterior tibialis pulses. Psychiatric pleasant and cooperative. Notes Right lower extremity: T the posterior aspect there is a scar that is epithelialized. No open wounds. o Engineer, maintenance) Signed:  02/25/2022 12:26:40 PM By: Kalman Shan DO Entered By: Kalman Shan on 02/25/2022 09:42:55 -------------------------------------------------------------------------------- Physician Orders Details Patient Name: Date of Service: [name], GA  RY W. 02/25/2022 8:00 A M Medical Record Number: [id number] Patient Account Number: [id number] Date of Birth/Sex: Treating RN: 11/26/1960 (62 y.o. [name] Primary Care Provider: PA TEL, HA RSHA L Other Clinician: Referring Provider: Treating Provider/Extender: Kalman Shan PA TEL, HA RSHA L Weeks in Treatment: 0 Verbal / Phone Orders: No Diagnosis Coding JHOEL, ARTHER ([id number]) [id number] Page 3 of 8 Follow-up Appointments Other: - Call us if you have any concerns!!:) No need to follow up at this time Edema Control - Lymphedema / SCD / Other Elevate legs to the level of the heart or above for 30 minutes daily and/or when sitting for 3-4 times a day throughout the day. Avoid standing for long periods of time. Patient to wear own compression stockings every day. Electronic Signature(s) Signed: 02/25/2022 12:26:40 PM By: Kalman Shan DO Entered By: Kalman Shan on 02/25/2022 09:43:06 -------------------------------------------------------------------------------- Problem List Details Patient Name: Date of Service: [name], [place] RY W. 02/25/2022 8:00 A M Medical Record Number: [id number] Patient Account Number: [id number] Date of Birth/Sex: Treating RN: Feb 19, 1960 (62 y.o. M) Primary Care Provider: PA TEL, HA RSHA L Other Clinician: Referring Provider: Treating Provider/Extender: Kalman Shan PA TEL, HA RSHA L Weeks in Treatment: 0 Active Problems ICD-10 Encounter Code Description Active Date MDM Diagnosis E11.628 Type 2 diabetes mellitus with other skin complications 123XX123 No Yes I73.9 Peripheral vascular disease, unspecified 02/25/2022 No Yes Inactive Problems Resolved Problems Electronic Signature(s) Signed: 02/25/2022 12:26:40 PM By: Kalman Shan DO Entered By: Kalman Shan on 02/25/2022 09:36:50 -------------------------------------------------------------------------------- Progress Note Details Patient Name:  Date of Service: [name], [name] 02/25/2022 8:00 A M Medical Record Number: [id number] Patient Account Number: [id number] Date of Birth/Sex: Treating RN: 09-Jun-1960 (62 y.o. M) Primary Care Provider: PA TEL, HA RSHA L Other Clinician: Referring Provider: Treating Provider/Extender: Kalman Shan PA TEL, HA RSHA L Weeks in Treatment: 0 Subjective Chief Complaint Information obtained from Patient 07/04/2021; patient arrives for review of wound on the right posterior calf 02/25/2022; history of wound to the right posterior calf with concern of reopening ZIHAN, GIMBEL ([id number]) [id number] Page 4 of 8 History of Present Illness (HPI) ADMISSION 07/04/2020 This is a 62 year old man who presented to Dr. Carlis Abbott in January of this year having critical limb ischemia. First angiogram was on 01/31/2020 showed proximal profound disease in the SFA with right foot runoff via diseased posterior tibia. He underwent a right femoral endarterectomy with a right common femoral to below-knee popliteal bypass on 02/04/2020. There were generally good vascular results however the day after the surgery according to his wife he developed a black eschar at presumably ischemic spot on the right posterior calf. He also ultimately had amputations on 02/29/2020 of the right first second and fifth toes in March she had a wound VAC on the right calf but this was too painful and was switched to wet-to-dry. At 1 point this clearly had exposed muscle and the wife was able to document this by showing me short movie on his phone. He had an operative debridement on the right calf in early April with a skin substitute o Myriad. In any case none of this really made any difference. He was hospitalized early in June with bilateral pulmonary emboli and is now on Eliquis. Long medical problem list but predominantly type 2 diabetes with peripheral neuropathy, spinal stenosis.  He is a smoker Most recent arterial  studies were done on 03/18/2020 on the right this showed a an ABI in the posterior tibial of 1.05 with biphasic waveforms on the left noncompressible at 1.38 but with a great toe pressure near normal at 1.11 again with triphasic waveforms. 6/29; patient arrives back in clinic. We use Santyl on the posterior calf wound on the right. Arrives in clinic with this looking a lot better. Healthy red granulated tissue over the majority of this. 7/7 the wound is cleaned up quite nicely I think it is time to change from Santyl to [facility]. They are using a local wrap here. There is edema in his upper lower leg. 7/21; using Hydrofera Blue. Nice improvement in surface area they are changing this every 2d 8/18; using Hydrofera Blue and bordered foam. Nice improvement in surface area 9/1; patient has some degree of lymphedema in the right leg we have not been wrapping him using Hydrofera Blue and bordered foam making decent progress in surface area. T oday he arrives with some erythema below the wound scaly skin this there is some form of contact dermatitis perhaps stasis dermatitis. I am not thinking that this is actually bacterial cellulitis as it is neither warm nor tender. The wound itself actually continues to look quite good 9/6; the patient's lymphedema in the right leg is considerably improved with compression. Also result of this the wound area is much better. We have been using Hydrofera Blue now under compression 9/14; still good edema control. Wound is smaller we have been using Hydrofera Blue for a prolonged period of time 9/20; still good edema control. Wound is slightly smaller but with a much healthier surface I changed to Iodoflex last week still under compression 9/30; edema control is good. Wound is smaller very healthy surface. I changed back to silver collagen today 10/6; edema control is adequate with 4-layer I am using silver collagen the wound measures smaller. Still some eschar and  flaking skin around the margins 10/13; edema control is adequate. He had eschar over the wound area. Although they thought this might be closed when we remove the eschar there is still an open area. Therefore we will redress this and rewrap 10/20; edema control is good. He still does not have his stockings again the area is heavily covered in eschar. I gently remove this with a #5 curette I did not see anything precisely open here. However small draining area could easily do this. We are going to change him to polymen still under the same compression while he orders stockings 10/27 he has a stockings today however dry flaky skin covering over a wound area that is not closed. This is disappointing. Using polymen under compression he has good edema control. This is worse than last week when I thought we did not have anything open 11/2; came in early because of pain in his feet caused by the wrap. For some reason they only called yesterday and he arrives today. fortunately nothing really major. He got relief when we took the wrap off. The wound has some surface eschar but I cannot identify any open wound 11/9; comes in today with a wound on the right lateral calf closed. This was initially an area that developed after revascularization. Potentially embolic. He has compression stockings 02/25/2022 Mr. Thanos Capan is a 62 year old male with a past medical history of peripheral arterial disease status post femoral endarterectomy, type 2 diabetes with peripheral neuropathy that presents to the clinic  due to history of right posterior leg wound. He was last seen in our clinic on 11/19/2020 and and during his admission he was treated for a wound to the right posterior calf. This was healed on discharge. T oday he presents with concerns that he reopen the wound. He had some white buildup to the area but once this was wiped away it was clear that this area was epithelialized with no open wound. He wears  compression stockings daily. Patient History Information obtained from Patient. Allergies penicillin, vancomycin, Ativan Family History Unknown History. Social History Current some day smoker, Marital Status - Married, Alcohol Use - Never, Drug Use - No History, Caffeine Use - Rarely. Medical History Eyes Denies history of Cataracts, Glaucoma, Optic Neuritis Ear/Nose/Mouth/Throat Denies history of Chronic sinus problems/congestion, Middle ear problems Hematologic/Lymphatic Patient has history of Anemia Denies history of Hemophilia, Human Immunodeficiency Virus, Lymphedema, Sickle Cell Disease Respiratory Patient has history of Asthma, Sleep Apnea Denies history of Aspiration, Chronic Obstructive Pulmonary Disease (COPD), Pneumothorax, Tuberculosis Cardiovascular Patient has history of Hypertension, Peripheral Arterial Disease Denies history of Angina, Arrhythmia, Congestive Heart Failure, Coronary Artery Disease, Deep Vein Thrombosis, Hypotension, Myocardial Infarction, Peripheral Venous Disease, Phlebitis, Vasculitis Gastrointestinal Denies history of Cirrhosis , Colitis, Crohnoos, Hepatitis A, Hepatitis B, Hepatitis C Endocrine Patient has history of Type II Diabetes Denies history of Type I Diabetes Genitourinary Denies history of End Stage Renal Disease Immunological Denies history of Lupus Erythematosus, Raynaudoos, Scleroderma Integumentary (Skin) [name] ([id number]) [id number] Page 5 of 8 Denies history of History of Burn Musculoskeletal Patient has history of Rheumatoid Arthritis Denies history of Gout, Osteoarthritis, Osteomyelitis Neurologic Patient has history of Neuropathy Denies history of Dementia, Quadriplegia, Paraplegia, Seizure Disorder Oncologic Denies history of Received Chemotherapy, Received Radiation Psychiatric Denies history of Anorexia/bulimia, Confinement Anxiety Hospitalization/Surgery History - Right toe  amp. 1,2,5. - endarterectomy femoral. - cervical disk. - elbow surgery. - femoral popliteal bypass graft. - Lung surgery for empyemea. - patch angioplasty. - spinal cord stimulator. - vein harvest. Medical A Surgical History Notes nd Respiratory copd Oncologic hx skin cancer Objective Constitutional respirations regular, non-labored and within target range for patient.. Vitals Time Taken: 8:25 AM, Temperature: 98.1 F, Pulse: 80 bpm, Respiratory Rate: 17 breaths/min, Blood Pressure: 139/78 mmHg. Cardiovascular 2+ dorsalis pedis/posterior tibialis pulses. Psychiatric pleasant and cooperative. General Notes: Right lower extremity: T the posterior aspect there is a scar that is epithelialized. No open wounds. o Assessment Active Problems ICD-10 Type 2 diabetes mellitus with other skin complications Peripheral vascular disease, unspecified Patient has a history of peripheral vascular disease with previous wounds to his right lower extremity and amputations to his right toes. Today he has no open wounds. I recommended he continue his daily compression stockings. He may follow-up as needed. Plan Follow-up Appointments: Other: - Call us if you have any concerns!!:) No need to follow up at this time Edema Control - Lymphedema / SCD / Other: Elevate legs to the level of the heart or above for 30 minutes daily and/or when sitting for 3-4 times a day throughout the day. Avoid standing for long periods of time. Patient to wear own compression stockings every day. 1. Follow-up as needed 2. Compression stockings daily Electronic Signature(s) Signed: 02/25/2022 12:26:40 PM By: Lucky Rathke ([id number]) [id number] Page 6 of 8 Entered By: Kalman Shan on 02/25/2022 09:43:46 -------------------------------------------------------------------------------- HxROS Details Patient Name: Date of Service: [name], [name] 02/25/2022 8:00 A M Medical  Record Number: [id number] Patient Account  Number: [id number] Date of Birth/Sex: Treating RN: 10/26/1960 (62 y.o. [name] Primary Care Provider: PA TEL, HA RSHA L Other Clinician: Referring Provider: Treating Provider/Extender: Kalman Shan PA TEL, HA RSHA L Weeks in Treatment: 0 Information Obtained From Patient Eyes Medical History: Negative for: Cataracts; Glaucoma; Optic Neuritis Ear/Nose/Mouth/Throat Medical History: Negative for: Chronic sinus problems/congestion; Middle ear problems Hematologic/Lymphatic Medical History: Positive for: Anemia Negative for: Hemophilia; Human Immunodeficiency Virus; Lymphedema; Sickle Cell Disease Respiratory Medical History: Positive for: Asthma; Sleep Apnea Negative for: Aspiration; Chronic Obstructive Pulmonary Disease (COPD); Pneumothorax; Tuberculosis Past Medical History Notes: copd Cardiovascular Medical History: Positive for: Hypertension; Peripheral Arterial Disease Negative for: Angina; Arrhythmia; Congestive Heart Failure; Coronary Artery Disease; Deep Vein Thrombosis; Hypotension; Myocardial Infarction; Peripheral Venous Disease; Phlebitis; Vasculitis Gastrointestinal Medical History: Negative for: Cirrhosis ; Colitis; Crohns; Hepatitis A; Hepatitis B; Hepatitis C Endocrine Medical History: Positive for: Type II Diabetes Negative for: Type I Diabetes Genitourinary Medical History: Negative for: End Stage Renal Disease Immunological Medical History: Negative for: Lupus Erythematosus; Raynauds; Scleroderma Integumentary (Skin) Medical History: Negative for: History of Burn XZAYVIER, LENNERT ([id number]) [phone].pdf Page 7 of 8 Musculoskeletal Medical History: Positive for: Rheumatoid Arthritis Negative for: Gout; Osteoarthritis; Osteomyelitis Neurologic Medical History: Positive for: Neuropathy Negative for: Dementia; Quadriplegia; Paraplegia; Seizure  Disorder Oncologic Medical History: Negative for: Received Chemotherapy; Received Radiation Past Medical History Notes: hx skin cancer Psychiatric Medical History: Negative for: Anorexia/bulimia; Confinement Anxiety Immunizations Pneumococcal Vaccine: Received Pneumococcal Vaccination: Yes Received Pneumococcal Vaccination On or After 60th Birthday: Yes Implantable Devices None Hospitalization / Surgery History Type of Hospitalization/Surgery Right toe amp. 1,2,5 endarterectomy femoral cervical disk elbow surgery femoral popliteal bypass graft Lung surgery for empyemea patch angioplasty spinal cord stimulator vein harvest Family and Social History Unknown History: Yes; Current some day smoker; Marital Status - Married; Alcohol Use: Never; Drug Use: No History; Caffeine Use: Rarely; Financial Concerns: No; Food, Clothing or Shelter Needs: No; Support System Lacking: No; Transportation Concerns: No Electronic Signature(s) Signed: 02/25/2022 12:26:40 PM By: Kalman Shan DO Signed: 02/26/2022 12:08:46 PM By: [name] RN Entered By: [name] on 02/24/2022 16:19:17 -------------------------------------------------------------------------------- SuperBill Details Patient Name: Date of Service: [name], [name] 02/25/2022 Medical Record Number: [id number] Patient Account Number: [id number] Date of Birth/Sex: Treating RN: 08/29/1960 (62 y.o. [name] Primary Care Provider: PA TEL, HA RSHA L Other Clinician: Referring Provider: Treating Provider/Extender: Kalman Shan PA TEL, HA RSHA L Weeks in Treatment: 0 Diagnosis Coding ICD-10 Codes Code Description MYSON, PENSE ([id number]) [id number] Page 8 of 8 E11.628 Type 2 diabetes mellitus with other skin complications [id number] Peripheral vascular disease, unspecified Facility Procedures : CPT4 Code: [id number] Description: [phone] - WOUND CARE VISIT-LEV 2 EST  PT Modifier: Quantity: 1 Physician Procedures : CPT4 Code Description Modifier [id number] - WC PHYS LEVEL 3 - EST PT ICD-10 Diagnosis Description E11.628 Type 2 diabetes mellitus with other skin complications [id number] Peripheral vascular disease, unspecified Quantity: 1 Electronic Signature(s) Signed: 02/25/2022 12:26:40 PM By: Kalman Shan DO Entered By: Kalman Shan on 02/25/2022 09:43:58

## 2022-03-02 ENCOUNTER — Ambulatory Visit: Payer: PPO

## 2022-03-02 ENCOUNTER — Ambulatory Visit (HOSPITAL_COMMUNITY): Payer: PPO

## 2022-04-06 ENCOUNTER — Ambulatory Visit (HOSPITAL_COMMUNITY)
Admission: RE | Admit: 2022-04-06 | Discharge: 2022-04-06 | Disposition: A | Discharge status: Home / Self Care | Payer: PPO | Source: Ambulatory Visit | Attending: Vascular Surgery | Admitting: Vascular Surgery

## 2022-04-06 ENCOUNTER — Ambulatory Visit (INDEPENDENT_AMBULATORY_CARE_PROVIDER_SITE_OTHER): Payer: PPO | Admitting: Physician Assistant

## 2022-04-06 ENCOUNTER — Ambulatory Visit (INDEPENDENT_AMBULATORY_CARE_PROVIDER_SITE_OTHER)
Admission: RE | Admit: 2022-04-06 | Discharge: 2022-04-06 | Disposition: A | Discharge status: Home / Self Care | Payer: PPO | Source: Ambulatory Visit | Attending: Vascular Surgery | Admitting: Vascular Surgery

## 2022-04-06 VITALS — BP 129/78 | HR 82 | Temp 98.2°F | Resp 20 | Ht 73.0 in | Wt 223.9 lb

## 2022-04-06 DIAGNOSIS — M7989 Other specified soft tissue disorders: Secondary | ICD-10-CM

## 2022-04-06 DIAGNOSIS — I70229 Atherosclerosis of native arteries of extremities with rest pain, unspecified extremity: Secondary | ICD-10-CM | POA: Diagnosis present

## 2022-04-06 DIAGNOSIS — I739 Peripheral vascular disease, unspecified: Secondary | ICD-10-CM

## 2022-04-06 LAB — VAS US ABI WITH/WO TBI
Left ABI: 1.28
Right ABI: 1.19

## 2022-04-06 NOTE — Progress Notes (Signed)
Office Note     CC:  follow up Requesting Provider:  Luciano Cutter, DO  HPI: Wayne Pratt is a 62 y.o. (25-Aug-1960) male who presents for routine follow up of PAD. He has history of multiple lower extremity interventions in the past. He most recently underwent right common femoral endarterectomy with profundoplasty and bovine patch with a right common femoral to below-knee pop bypass with saphenous vein on 02/04/2020 for CLI with rest pain and tissue loss by Dr. Carlis Abbott. He subsequently required partial toe amputations of right toes 1 2 and 5 on 02/29/2020 by Dr. Carlis Abbott. He also later developed a wound on his right calf that required debridement of skin, subcu tissue, muscle and tendon of right calf ulcer and application of autologous skin substitute and placement of vac on 04/16/2020 by Dr. Trula Slade.   He was last seen in July of 2023. At that time he was without any claudication or rest pain and his toes were fully healed. His biggest concern was right lower extremity swelling and redness. He was advised to wear compression and also elevate his legs. His non invasive studies showed adequate patency of his RLE bypass graft and normal ABIs.   Today he is here with his wife. He says the swelling is still present in his right leg but that it is much better. He is now tolerating his compression stocking and he tries to elevate when he can. He is not having any pain on ambulation or rest and no new tissue loss. He says unfortunately that he is still smoking. He explains that it has been a tough year.    The pt is on a statin for cholesterol management.    The pt is on an aspirin.    Other AC:  none The pt is on ACEI for hypertension.  The pt does  have diabetes. Tobacco hx:  current  Past Medical History:  Diagnosis Date   Anemia    Anxiety    Arthritis    Asthma    Cervical disc disease    Essential hypertension    History of kidney stones    History of pleural empyema    History of pneumonia     History of skin cancer    Hypertension    Insomnia    Migraine    Mixed hyperlipidemia    Nephrolithiasis    Neuropathy    PAD (peripheral artery disease) (HCC)    Sleep apnea    Stop Bang score of 7   Type 2 diabetes mellitus ([place])     Past Surgical History:  Procedure Laterality Date   ABDOMINAL AORTOGRAM W/LOWER EXTREMITY N/A 01/31/2020   Procedure: ABDOMINAL AORTOGRAM W/LOWER EXTREMITY;  Surgeon: Marty Heck, MD;  Location: [facility];  Service: Cardiovascular;  Laterality: N/A;   AMPUTATION Right 02/29/2020   Procedure: RIGHT TOE AMPUTATIONS, 1, 2, 5;  Surgeon: Marty Heck, MD;  Location: [facility];  Service: Vascular;  Laterality: Right;   ANTERIOR CERVICAL DECOMP/DISCECTOMY FUSION N/A 11/16/2021   Procedure: CERVICAL FOUR-FIVE ANTERIOR CERVICAL DECOMPRESSION/DISCECTOMY FUSION WITH PLATE REMOVAL;  Surgeon: Eustace Moore, MD;  Location: [facility];  Service: Neurosurgery;  Laterality: N/A;   APPLICATION OF WOUND VAC Right 02/29/2020   Procedure: APPLICATION OF WOUND VAC;  Surgeon: Marty Heck, MD;  Location: [place];  Service: Vascular;  Laterality: Right;   APPLICATION OF WOUND VAC Right 04/16/2020   Procedure: APPLICATION OF WOUND VAC;  Surgeon: Serafina Mitchell, MD;  Location: [facility];  Service: Vascular;  Laterality: Right;   BIOPSY  10/05/2011   Dr. Oneida Alar :non-erosive gastritis   BIOPSY  08/06/2014   Procedure: BIOPSY;  Surgeon: Danie Binder, MD;  Location: [facility];  Service: Endoscopy;;   CERVICAL DISC SURGERY     ELBOW SURGERY Right    ENDARTERECTOMY FEMORAL Right 02/04/2020   Procedure: RIGHT FEMORAL ENDARTERECTOMY with Profundaplasty;  Surgeon: Marty Heck, MD;  Location: [place];  Service: Vascular;  Laterality: Right;   ESOPHAGOGASTRODUODENOSCOPY  09/2011   Dr. Oneida Alar: chronic inactive gastritis    ESOPHAGOGASTRODUODENOSCOPY N/A 08/18/2017   Procedure: ESOPHAGOGASTRODUODENOSCOPY (EGD);  Surgeon: Milus Banister, MD;  Location: [facility];  Service: Endoscopy;  Laterality: N/A;   ESOPHAGOGASTRODUODENOSCOPY N/A 03/09/2018   Procedure: ESOPHAGOGASTRODUODENOSCOPY (EGD);  Surgeon: Milus Banister, MD;  Location: [facility];  Service: Endoscopy;  Laterality: N/A;   ESOPHAGOGASTRODUODENOSCOPY (EGD) WITH PROPOFOL N/A 08/06/2014   mild non-erosive gastritis, duodenitis   EUS N/A 08/18/2017   Procedure: UPPER ENDOSCOPIC ULTRASOUND (EUS) RADIAL;  Surgeon: Milus Banister, MD;  Location: [facility];  Service: Endoscopy;  Laterality: N/A;   EUS N/A 03/09/2018   Procedure: UPPER ENDOSCOPIC ULTRASOUND (EUS) RADIAL;  Surgeon: Milus Banister, MD;  Location: [facility];  Service: Endoscopy;  Laterality: N/A;   EYE SURGERY Right    fixed tear in the cornea with laser   FEMORAL-POPLITEAL BYPASS GRAFT Right 02/04/2020   Procedure: BYPASS GRAFT FEMORAL-POPLITEAL ARTERY With Vein Harvest;  Surgeon: Marty Heck, MD;  Location: [place];  Service: Vascular;  Laterality: Right;   FINE NEEDLE ASPIRATION  03/09/2018   Procedure: FINE NEEDLE ASPIRATION;  Surgeon: Milus Banister, MD;  Location: [facility];  Service: Endoscopy;;   FLEXIBLE SIGMOIDOSCOPY  10/05/2011   Dr. Fields:poor prep   I & D EXTREMITY Right 04/16/2020   Procedure: RIGHT CALF WOUND IRRIGATION, DEBRIDEMENT AND GRAFTING;  Surgeon: Serafina Mitchell, MD;  Location: [place] [facility];  Service: Vascular;  Laterality: Right;   LUNG SURGERY  2010   for empyema   PATCH ANGIOPLASTY Right 02/04/2020   Procedure: PATCH ANGIOPLASTY;  Surgeon: Marty Heck, MD;  Location: [place];  Service: Vascular;  Laterality: Right;   SPINAL CORD STIMULATOR IMPLANT  08/29/2019   [employer] alpha [phone]   SPINAL CORD STIMULATOR INSERTION N/A 08/01/2015   Procedure: Spinal Cord Stimulator Implant;  Surgeon: Eustace Moore, MD;  Location: [facility];  Service: Neurosurgery;  Laterality: N/A;   TONSILLECTOMY     VASECTOMY Bilateral 1995   VEIN HARVEST Right  02/04/2020   Procedure: VEIN HARVEST Greater Saphenous Vein;  Surgeon: Marty Heck, MD;  Location: [facility];  Service: Vascular;  Laterality: Right;   WOUND DEBRIDEMENT Right 02/29/2020   Procedure: RIGHT CALF DEBRIDEMENT;  Surgeon: Marty Heck, MD;  Location: [facility];  Service: Vascular;  Laterality: Right;    Social History   Socioeconomic History   Marital status: Married    Spouse name: Not on file   Number of children: 2   Years of education: Not on file   Highest education level: Not on file  Occupational History   Occupation: disabled    Comment: 2005  Tobacco Use   Smoking status: Every Day    Packs/day: 1.00    Years: 39.00    Additional pack years: 0.00    Total pack years: 39.00    Types: Cigarettes    Last attempt to quit: 02/04/2020  Years since quitting: 2.1   Smokeless tobacco: Never  Vaping Use   Vaping Use: Never used  Substance and Sexual Activity   Alcohol use: No    Alcohol/week: 0.0 standard drinks of alcohol   Drug use: No   Sexual activity: Yes    Birth control/protection: None  Other Topics Concern   Not on file  Social History Narrative   Not on file   Social Determinants of Health   Financial Resource Strain: Not on file  Food Insecurity: Not on file  Transportation Needs: Not on file  Physical Activity: Not on file  Stress: Not on file  Social Connections: Not on file  Intimate Partner Violence: Not on file    Family History  Problem Relation Age of Onset   Prostate cancer Other        all male members on mother's side   Heart attack Father    COPD Maternal Grandmother    Cancer Paternal Grandmother    Colon cancer Neg Hx     Current Outpatient Medications  Medication Sig Dispense Refill   acetaminophen (TYLENOL) 325 MG tablet Take 325-650 mg by mouth every 6 (six) hours as needed for mild pain or moderate pain.     albuterol (VENTOLIN HFA) 108 (90 Base) MCG/ACT inhaler Inhale 1-2 puffs into the lungs every 6  (six) hours as needed for wheezing or shortness of breath. 18 g 0   aspirin EC 81 MG EC tablet Take 1 tablet (81 mg total) by mouth daily at 6 (six) AM. Swallow whole. 30 tablet 11   clonazePAM (KLONOPIN) 0.5 MG tablet Take 0.5 mg by mouth 2 (two) times daily.      cyclobenzaprine (FLEXERIL) 10 MG tablet Take 1 tablet (10 mg total) by mouth 3 (three) times daily. 30 tablet 0   GLIPIZIDE XL 10 MG 24 hr tablet Take 10 mg by mouth 2 (two) times daily.     insulin glargine (LANTUS SOLOSTAR) 100 UNIT/ML Solostar Pen Inject into the skin.     lisinopril (ZESTRIL) 5 MG tablet TAKE (1) TABLET BY MOUTH DAILY 90 tablet 1   lovastatin (MEVACOR) 40 MG tablet Take 40 mg by mouth at bedtime.      Melatonin 10 MG TABS Take 40 mg by mouth at bedtime.     metFORMIN (GLUCOPHAGE) 1000 MG tablet Take 1,000 mg by mouth 2 (two) times daily with a meal.     Oxycodone HCl 10 MG TABS Take by mouth.     pioglitazone (ACTOS) 45 MG tablet Take 45 mg by mouth daily.     No current facility-administered medications for this visit.    Allergies  Allergen Reactions   Penicillins Anaphylaxis and Other (See Comments)    Has patient had a PCN reaction causing immediate rash, facial/tongue/throat swelling, SOB or lightheadedness with hypotension: Yes Has patient had a PCN reaction causing severe rash involving mucus membranes or skin necrosis: Yes Has patient had a PCN reaction that required hospitalization: No Has patient had a PCN reaction occurring within the last 10 years: No If all of the above answers are "NO", then may proceed with Cephalosporin use.    Vancomycin Anaphylaxis   Ativan [Lorazepam] Other (See Comments)    Violent and Mean    Baclofen      REVIEW OF SYSTEMS:   [X]  denotes positive finding, [ ]  denotes negative finding Cardiac  Comments:  Chest pain or chest pressure:    Shortness of breath upon exertion:  Short of breath when lying flat:    Irregular heart rhythm:        Vascular    Pain  in calf, thigh, or hip brought on by ambulation:    Pain in feet at night that wakes you up from your sleep:     Blood clot in your veins:    Leg swelling:  X       Pulmonary    Oxygen at home:    Productive cough:     Wheezing:         Neurologic    Sudden weakness in arms or legs:     Sudden numbness in arms or legs:     Sudden onset of difficulty speaking or slurred speech:    Temporary loss of vision in one eye:     Problems with dizziness:         Gastrointestinal    Blood in stool:     Vomited blood:         Genitourinary    Burning when urinating:     Blood in urine:        Psychiatric    Major depression:         Hematologic    Bleeding problems:    Problems with blood clotting too easily:        Skin    Rashes or ulcers:        Constitutional    Fever or chills:      PHYSICAL EXAMINATION:  Vitals:   04/06/22 1425  BP: 129/78  Pulse: 82  Resp: 20  Temp: 98.2 F (36.8 C)  SpO2: 97%  Weight: 223 lb 14.4 oz (101.6 kg)  Height: 6\' 1"  (1.854 m)    General:  WDWN in NAD; vital signs documented above Gait: Normal HENT: WNL, normocephalic Pulmonary: normal non-labored breathing , without wheezing Cardiac: regular HR, without  Murmurs without carotid bruit Abdomen: soft Vascular Exam/Pulses:  Right Left  Radial 2+ (normal) 2+ (normal)  Femoral 2+ (normal) 2+ (normal)  Popliteal absent absent  DP absent absent  PT absent absent   Extremities: without ischemic changes, without Gangrene , without cellulitis; without open wounds Musculoskeletal: no muscle wasting or atrophy  Neurologic: A&O X 3;  No focal weakness or paresthesias are detected Psychiatric:  The pt has Normal affect.   Non-Invasive Vascular Imaging:   +-------+-----------+-----------+------------+------------+  ABI/TBIToday's ABIToday's TBIPrevious ABIPrevious TBI  +-------+-----------+-----------+------------+------------+  Right 1.19       amputated                             +-------+-----------+-----------+------------+------------+  Left  1.28       0.90                                 +-------+-----------+-----------+------------+------------+   VAS [place] lower extremity bypass graft duplex: Summary:  Right: Patent femoral to popliteal bypass graft with no obvious evidence of stenosis.    ASSESSMENT/PLAN:: 62 y.o. male here for follow up for PAD. He is currently without any claudication, rest pain or tissue loss. He does still have swelling in his right leg. ABI today is unchanged from his prior with triphasic flow bilaterally. RLE Bypass graft is patent without any signs of stenosis.  - Encourage walking regimen  - continue compression stocking and elevation as needed - Encourage smoking cessation  - Continue Aspirin  and Statin - He will follow up again in 6 months with RLE bypass graft duplex and ABIs   Karoline Caldwell, PA-C [facility] [phone]  On call MD:   Trula Slade

## 2022-04-13 ENCOUNTER — Other Ambulatory Visit: Payer: Self-pay

## 2022-04-13 DIAGNOSIS — I70229 Atherosclerosis of native arteries of extremities with rest pain, unspecified extremity: Secondary | ICD-10-CM

## 2022-04-13 DIAGNOSIS — I739 Peripheral vascular disease, unspecified: Secondary | ICD-10-CM

## 2022-05-06 ENCOUNTER — Other Ambulatory Visit: Payer: Self-pay | Admitting: Cardiology

## 2022-07-11 ENCOUNTER — Emergency Department (HOSPITAL_COMMUNITY): Payer: PPO

## 2022-07-11 ENCOUNTER — Encounter (HOSPITAL_COMMUNITY): Payer: Self-pay | Admitting: Emergency Medicine

## 2022-07-11 ENCOUNTER — Emergency Department (HOSPITAL_COMMUNITY)
Admission: EM | Admit: 2022-07-11 | Discharge: 2022-07-11 | Disposition: A | Discharge status: Home / Self Care | Payer: PPO | Attending: Emergency Medicine | Admitting: Emergency Medicine

## 2022-07-11 ENCOUNTER — Other Ambulatory Visit: Payer: Self-pay

## 2022-07-11 DIAGNOSIS — S0990XA Unspecified injury of head, initial encounter: Secondary | ICD-10-CM

## 2022-07-11 DIAGNOSIS — M25511 Pain in right shoulder: Secondary | ICD-10-CM | POA: Insufficient documentation

## 2022-07-11 DIAGNOSIS — W19XXXA Unspecified fall, initial encounter: Secondary | ICD-10-CM

## 2022-07-11 DIAGNOSIS — R0781 Pleurodynia: Secondary | ICD-10-CM | POA: Diagnosis not present

## 2022-07-11 DIAGNOSIS — Z7982 Long term (current) use of aspirin: Secondary | ICD-10-CM | POA: Diagnosis not present

## 2022-07-11 DIAGNOSIS — W010XXA Fall on same level from slipping, tripping and stumbling without subsequent striking against object, initial encounter: Secondary | ICD-10-CM | POA: Diagnosis not present

## 2022-07-11 DIAGNOSIS — Z794 Long term (current) use of insulin: Secondary | ICD-10-CM | POA: Diagnosis not present

## 2022-07-11 DIAGNOSIS — Z7984 Long term (current) use of oral hypoglycemic drugs: Secondary | ICD-10-CM | POA: Diagnosis not present

## 2022-07-11 DIAGNOSIS — E119 Type 2 diabetes mellitus without complications: Secondary | ICD-10-CM | POA: Insufficient documentation

## 2022-07-11 MED ORDER — ACETAMINOPHEN 500 MG PO TABS
1000.0000 mg | ORAL_TABLET | Freq: Once | ORAL | Status: AC
  Administered 2022-07-11: 1000 mg via ORAL
  Filled 2022-07-11: qty 2

## 2022-07-11 NOTE — ED Triage Notes (Signed)
Pt ambulatory to triage, states he was outside working in the yard and fell off the bushhog landing in rocks.  Pt with c/o pain to back of head, back and right arm.  Pt believes he did pass out.  Pt does not take blood thinners.

## 2022-07-11 NOTE — ED Provider Notes (Signed)
[place] EMERGENCY DEPARTMENT AT [facility] Provider Note   CSN: [id number] Arrival date & time: 07/11/22  1357     History  Chief Complaint  Patient presents with   Wayne Pratt is a 62 y.o. male status post cervical spinal fusion, spinal cord stimulator, history of diabetes presented after a fall.  Patient fell at 1300 when he tripped over a wire in the garage.  Patient states he fell back onto some rocks at the back of his head.  Since then patient states he has had blurry vision in the back of his head hurts along with his neck but denies any weakness, numbness/tingling.  Patient states he also landed on the right side of his body as well on his right shoulder and so his right shoulder and right ribs hurt as well but patient denies any chest pain or shortness of breath.  Patient states he is still move his right arm but that it hurts to touch the top of the shoulder.  Patient has not had any pain meds.  Patient denies any nausea or vomiting, blood thinners, bleeding disorders.  Patient has been able to walk since then without issue according to his wife.  Patient denies abdominal pain, back pain  Home Medications Prior to Admission medications   Medication Sig Start Date End Date Taking? Authorizing Provider  acetaminophen (TYLENOL) 325 MG tablet Take 325-650 mg by mouth every 6 (six) hours as needed for mild pain or moderate pain.    [provider]  albuterol (VENTOLIN HFA) 108 (90 Base) MCG/ACT inhaler Inhale 1-2 puffs into the lungs every 6 (six) hours as needed for wheezing or shortness of breath. 03/15/19   Avegno, Zachery Dakins, FNP  aspirin EC 81 MG EC tablet Take 1 tablet (81 mg total) by mouth daily at 6 (six) AM. Swallow whole. 02/08/20   Emilie Rutter, PA-C  clonazePAM (KLONOPIN) 0.5 MG tablet Take 0.5 mg by mouth 2 (two) times daily.  06/17/17   [provider]  cyclobenzaprine (FLEXERIL) 10 MG tablet Take 1 tablet (10 mg total) by mouth 3  (three) times daily. 11/17/21   Meyran, Tiana Loft, NP  GLIPIZIDE XL 10 MG 24 hr tablet Take 10 mg by mouth 2 (two) times daily. 01/17/19   [provider]  insulin glargine (LANTUS SOLOSTAR) 100 UNIT/ML Solostar Pen Inject into the skin. 04/06/22 04/06/23  [provider]  lisinopril (ZESTRIL) 5 MG tablet TAKE (1) TABLET BY MOUTH DAILY 05/06/22   Jonelle Sidle, MD  lovastatin (MEVACOR) 40 MG tablet Take 40 mg by mouth at bedtime.  04/01/17   [provider]  Melatonin 10 MG TABS Take 40 mg by mouth at bedtime.    [provider]  metFORMIN (GLUCOPHAGE) 1000 MG tablet Take 1,000 mg by mouth 2 (two) times daily with a meal. 04/01/17   [provider]  Oxycodone HCl 10 MG TABS Take by mouth. 07/11/21   [provider]  pioglitazone (ACTOS) 45 MG tablet Take 45 mg by mouth daily. 04/13/21   [provider]      Allergies    Penicillins, Vancomycin, Ativan [lorazepam], and Baclofen    Review of Systems   Review of Systems See HPI Physical Exam Updated Vital Signs BP 129/79   Pulse 66   Temp 98.1 F (36.7 C) (Oral)   Resp 13   Ht 6\' 1"  (1.854 m)   Wt 104.3 kg   SpO2 98%  BMI 30.34 kg/m  Physical Exam Vitals reviewed.  Constitutional:      General: He is not in acute distress. HENT:     Head: Normocephalic and atraumatic.  Eyes:     Extraocular Movements: Extraocular movements intact.     Conjunctiva/sclera: Conjunctivae normal.     Pupils: Pupils are equal, round, and reactive to light.  Neck:     Comments: No abnormalities palpated Cardiovascular:     Rate and Rhythm: Normal rate and regular rhythm.     Pulses: Normal pulses.     Heart sounds: Normal heart sounds.     Comments: 2+ bilateral radial/dorsalis pedis pulses with regular rate Pulmonary:     Effort: Pulmonary effort is normal. No respiratory distress.     Breath sounds: Normal breath sounds.  Abdominal:     Palpations: Abdomen is soft.      Tenderness: There is no abdominal tenderness. There is no guarding or rebound.  Musculoskeletal:        General: Normal range of motion.     Cervical back: Normal range of motion and neck supple. Tenderness (Midline) present.     Comments: 5 out of 5 bilateral grip/leg extension strength Tender to palpation on right acromion No step-off/crepitus/abnormalities palpated Tender to palpation in right lower ribs without abnormalities palpated No obvious deformities noted  Skin:    General: Skin is warm and dry.     Capillary Refill: Capillary refill takes less than 2 seconds.  Neurological:     General: No focal deficit present.     Mental Status: He is alert and oriented to person, place, and time.     Sensory: Sensation is intact.     Motor: Motor function is intact.     Coordination: Coordination is intact.     Comments: Sensation intact in all 4 limbs Vision grossly intact Cranial nerves III through XII intact  Psychiatric:        Mood and Affect: Mood normal.     ED Results / Procedures / Treatments   Labs (all labs ordered are listed, but only abnormal results are displayed) Labs Reviewed - No data to display  EKG EKG Interpretation Date/Time:  Sunday July 11 2022 14:33:18 EDT Ventricular Rate:  87 PR Interval:  160 QRS Duration:  87 QT Interval:  336 QTC Calculation: 405 R Axis:   41  Text Interpretation: Sinus rhythm Probable left atrial enlargement No significant change since prior 4/23 Confirmed by Meridee Score [id number]) on 07/11/2022 3:12:19 PM  Radiology DG Ribs Unilateral W/Chest Right  Result Date: 07/11/2022 CLINICAL DATA:  Fall EXAM: RIGHT RIBS AND CHEST - 3+ VIEW COMPARISON:  04/27/2021 FINDINGS: Single view chest demonstrates hardware in the cervicothoracic spine. Left lung is clear. No pneumothorax. Mild right CP angle thickening or scarring. No pneumothorax. Right rib series demonstrates no acute displaced rib fracture. Chronic right fourth and fifth rib  deformities. IMPRESSION: No acute displaced right rib fracture. Electronically Signed   By: Jasmine Pang M.D.   On: 07/11/2022 15:55   DG Shoulder Right  Result Date: 07/11/2022 CLINICAL DATA:  Fall EXAM: RIGHT SHOULDER - 2+ VIEW COMPARISON:  11/12/2016 FINDINGS: No fracture or malalignment. Mild calcific tendinopathy. Mild glenohumeral and AC joint degenerative change. Hardware in the cervicothoracic spine. Carotid vascular calcification IMPRESSION: 1. No acute osseous abnormality. 2. Mild degenerative change. 3. Calcific tendinopathy. Electronically Signed   By: Jasmine Pang M.D.   On: 07/11/2022 15:53   CT Head Wo Contrast  Result Date:  07/11/2022 CLINICAL DATA:  Fall EXAM: CT HEAD WITHOUT CONTRAST CT CERVICAL SPINE WITHOUT CONTRAST TECHNIQUE: Multidetector CT imaging of the head and cervical spine was performed following the standard protocol without intravenous contrast. Multiplanar CT image reconstructions of the cervical spine were also generated. RADIATION DOSE REDUCTION: This exam was performed according to the departmental dose-optimization program which includes automated exposure control, adjustment of the mA and/or kV according to patient size and/or use of iterative reconstruction technique. COMPARISON:  None Available. FINDINGS: CT HEAD FINDINGS Brain: No evidence of acute infarction, hemorrhage, hydrocephalus, extra-axial collection or mass lesion/mass effect. Vascular: No hyperdense vessel or unexpected calcification. Skull: Normal. Negative for fracture or focal lesion. Sinuses/Orbits: No acute finding. Other: None. CT CERVICAL SPINE FINDINGS Alignment: Normal. Skull base and vertebrae: No acute fracture. No primary bone lesion or focal pathologic process. Soft tissues and spinal canal: No prevertebral fluid or swelling. No visible canal hematoma. Disc levels: Status post anterior cervical discectomy and fusion of C4-C7. Upper chest: Negative. Other: None. IMPRESSION: 1. No acute  intracranial abnormality. 2. Status post anterior cervical discectomy and fusion of C4-C7. No acute fracture or static subluxation of the cervical spine. Electronically Signed   By: Jearld Lesch M.D.   On: 07/11/2022 15:51   CT Cervical Spine Wo Contrast  Result Date: 07/11/2022 CLINICAL DATA:  Fall EXAM: CT HEAD WITHOUT CONTRAST CT CERVICAL SPINE WITHOUT CONTRAST TECHNIQUE: Multidetector CT imaging of the head and cervical spine was performed following the standard protocol without intravenous contrast. Multiplanar CT image reconstructions of the cervical spine were also generated. RADIATION DOSE REDUCTION: This exam was performed according to the departmental dose-optimization program which includes automated exposure control, adjustment of the mA and/or kV according to patient size and/or use of iterative reconstruction technique. COMPARISON:  None Available. FINDINGS: CT HEAD FINDINGS Brain: No evidence of acute infarction, hemorrhage, hydrocephalus, extra-axial collection or mass lesion/mass effect. Vascular: No hyperdense vessel or unexpected calcification. Skull: Normal. Negative for fracture or focal lesion. Sinuses/Orbits: No acute finding. Other: None. CT CERVICAL SPINE FINDINGS Alignment: Normal. Skull base and vertebrae: No acute fracture. No primary bone lesion or focal pathologic process. Soft tissues and spinal canal: No prevertebral fluid or swelling. No visible canal hematoma. Disc levels: Status post anterior cervical discectomy and fusion of C4-C7. Upper chest: Negative. Other: None. IMPRESSION: 1. No acute intracranial abnormality. 2. Status post anterior cervical discectomy and fusion of C4-C7. No acute fracture or static subluxation of the cervical spine. Electronically Signed   By: Jearld Lesch M.D.   On: 07/11/2022 15:51    Procedures Procedures    Medications Ordered in ED Medications  acetaminophen (TYLENOL) tablet 1,000 mg (1,000 mg Oral Given 07/11/22 1549)    ED Course/  Medical Decision Making/ A&P                             Medical Decision Making Amount and/or Complexity of Data Reviewed Radiology: ordered.  Risk OTC drugs.   Marni Griffon 62 y.o. presented today for fall. Working DDx that I considered at this time includes, but not limited to, vasovagal episode, mechanical fall, ICH, epidural/subdural hematoma, basilar skull fracture, anemia, electrolyte abnormalities, drug-induced, arrhythmia, UTI, fracture.  R/o DDx: vasovagal episode, ICH, epidural/subdural hematoma, basilar skull fracture, anemia, electrolyte abnormalities, drug-induced, arrhythmia, UTI, fracture: These are considered less likely due to history of present illness and physical exam findings  Review of prior external notes: 11/16/2021 discharge  Unique Tests  and My Interpretation:  CT head without contrast: No acute changes CT cervical spine without contrast:No acute changes Right shoulder x-ray:No acute changes Right rib x-ray:No acute changes EKG: 87 bpm, no ST elevations/depressions or blocks noted  Discussion with Independent Historian:  Wife  Discussion of Management of Tests: None  Risk: Medium: prescription drug management  Risk Stratification Score: None  Plan: On exam patient was in no acute distress and had stable vitals.  Patient did have midline cervical tenderness without abnormalities palpated along with tenderness in the posterior aspect of his head, right acromion, right lower ribs without abnormalities palpated.  Patient's neurologic exam was unremarkable and there were no signs of basilar skull fracture.  Patient's story is consistent with mechanical fall as patient not endorse any prodromal symptoms and states that he just tripped over a wire in which the wife supported the story.  Imaging will be obtained along with an EKG and patient was given Tylenol for pain management.  Patient's fall sounds mechanical in nature.  Patient stable at this  time.  Patient's imaging came back reassuring with no acute changes.  I spoke to the patient and patient stated he feels comfortable being discharged with outpatient follow-up.  I spoke to the patient by avoiding strenuous activity and to rest the next few days as he most likely bruised his body.  Patient may take Tylenol every 6 hours needed for pain and ice the areas of injury.  Patient was given return precautions. Patient stable for discharge at this time.  Patient verbalized understanding of plan.         Final Clinical Impression(s) / ED Diagnoses Final diagnoses:  Fall, initial encounter  Injury of head, initial encounter  Acute pain of right shoulder  Rib pain    Rx / DC Orders ED Discharge Orders     None         Remi Deter 07/11/22 1628    Terrilee Files, MD 07/12/22 1110

## 2022-07-11 NOTE — Discharge Instructions (Signed)
Please follow-up with your primary care provider recent symptoms and ER visit.  Today your imaging was reassuring and you most likely have her bruise on her head and shoulder.  You may take Tylenol every 6 hours as needed for pain.  You may ice these areas as well.  Please avoid any strenuous physical activity and rest over the next few days. If symptoms change or worsen please return to ER.

## 2022-10-01 ENCOUNTER — Emergency Department (HOSPITAL_COMMUNITY): Payer: PPO

## 2022-10-01 ENCOUNTER — Other Ambulatory Visit: Payer: Self-pay

## 2022-10-01 ENCOUNTER — Encounter (HOSPITAL_COMMUNITY): Payer: Self-pay

## 2022-10-01 ENCOUNTER — Emergency Department (HOSPITAL_COMMUNITY)
Admission: EM | Admit: 2022-10-01 | Discharge: 2022-10-01 | Disposition: A | Discharge status: Home / Self Care | Payer: PPO | Attending: Emergency Medicine | Admitting: Emergency Medicine

## 2022-10-01 DIAGNOSIS — Z7982 Long term (current) use of aspirin: Secondary | ICD-10-CM | POA: Diagnosis not present

## 2022-10-01 DIAGNOSIS — E119 Type 2 diabetes mellitus without complications: Secondary | ICD-10-CM | POA: Diagnosis not present

## 2022-10-01 DIAGNOSIS — Z79899 Other long term (current) drug therapy: Secondary | ICD-10-CM | POA: Insufficient documentation

## 2022-10-01 DIAGNOSIS — Z794 Long term (current) use of insulin: Secondary | ICD-10-CM | POA: Insufficient documentation

## 2022-10-01 DIAGNOSIS — Z7984 Long term (current) use of oral hypoglycemic drugs: Secondary | ICD-10-CM | POA: Insufficient documentation

## 2022-10-01 DIAGNOSIS — M5432 Sciatica, left side: Secondary | ICD-10-CM

## 2022-10-01 DIAGNOSIS — M79605 Pain in left leg: Secondary | ICD-10-CM | POA: Diagnosis present

## 2022-10-01 DIAGNOSIS — M5442 Lumbago with sciatica, left side: Secondary | ICD-10-CM | POA: Insufficient documentation

## 2022-10-01 LAB — COMPREHENSIVE METABOLIC PANEL
ALT: 14 U/L (ref 0–44)
AST: 17 U/L (ref 15–41)
Albumin: 3.7 g/dL (ref 3.5–5.0)
Alkaline Phosphatase: 79 U/L (ref 38–126)
Anion gap: 9 (ref 5–15)
BUN: 13 mg/dL (ref 8–23)
CO2: 26 mmol/L (ref 22–32)
Calcium: 9.4 mg/dL (ref 8.9–10.3)
Chloride: 98 mmol/L (ref 98–111)
Creatinine, Ser: 1.08 mg/dL (ref 0.61–1.24)
GFR, Estimated: >60 mL/min (ref >60)
Glucose, Bld: 217 mg/dL — ABNORMAL HIGH (ref 70–99)
Potassium: 4.4 mmol/L (ref 3.5–5.1)
Sodium: 133 mmol/L — ABNORMAL LOW (ref 135–145)
Total Bilirubin: 0.6 mg/dL (ref 0.3–1.2)
Total Protein: 7.2 g/dL (ref 6.5–8.1)

## 2022-10-01 LAB — CBC WITH DIFFERENTIAL/PLATELET
Abs Immature Granulocytes: 0.02 10*3/uL (ref 0.00–0.07)
Basophils Absolute: 0 10*3/uL (ref 0.0–0.1)
Basophils Relative: 0 %
Eosinophils Absolute: 0.2 10*3/uL (ref 0.0–0.5)
Eosinophils Relative: 2 %
HCT: 39.1 % (ref 39.0–52.0)
Hemoglobin: 12.1 g/dL — ABNORMAL LOW (ref 13.0–17.0)
Immature Granulocytes: 0 %
Lymphocytes Relative: 19 %
Lymphs Abs: 1.7 10*3/uL (ref 0.7–4.0)
MCH: 29.1 pg (ref 26.0–34.0)
MCHC: 30.9 g/dL (ref 30.0–36.0)
MCV: 94 fL (ref 80.0–100.0)
Monocytes Absolute: 0.8 10*3/uL (ref 0.1–1.0)
Monocytes Relative: 9 %
Neutro Abs: 6.4 10*3/uL (ref 1.7–7.7)
Neutrophils Relative %: 70 %
Platelets: 362 10*3/uL (ref 150–400)
RBC: 4.16 MIL/uL — ABNORMAL LOW (ref 4.22–5.81)
RDW: 13.6 % (ref 11.5–15.5)
WBC: 9.1 10*3/uL (ref 4.0–10.5)
nRBC: 0 % (ref 0.0–0.2)

## 2022-10-01 MED ORDER — FENTANYL CITRATE PF 50 MCG/ML IJ SOSY
25.0000 ug | PREFILLED_SYRINGE | Freq: Once | INTRAMUSCULAR | Status: AC
  Administered 2022-10-01: 25 ug via INTRAVENOUS
  Filled 2022-10-01: qty 1

## 2022-10-01 MED ORDER — METHOCARBAMOL 500 MG PO TABS: 500.0000 mg | ORAL_TABLET | Freq: Four times a day (QID) | ORAL | 0 refills | Status: DC | PRN

## 2022-10-01 MED ORDER — KETOROLAC TROMETHAMINE 15 MG/ML IJ SOLN
15.0000 mg | Freq: Once | INTRAMUSCULAR | Status: AC
  Administered 2022-10-01: 15 mg via INTRAVENOUS
  Filled 2022-10-01: qty 1

## 2022-10-01 MED ORDER — NAPROXEN 500 MG PO TABS: 500.0000 mg | ORAL_TABLET | Freq: Two times a day (BID) | ORAL | 0 refills | Status: DC

## 2022-10-01 NOTE — Discharge Instructions (Signed)
Pleasure taking care of you today.  Symptoms are consistent with sciatica.  We are treating you with anti-inflammatories and muscle relaxers.  You can try to rest, use over-the-counter muscle relaxers and follow-up closely with PCP.  Physical therapy can help.  Your ultrasound of your legs were reassuring.  Blood work was reassuring as well but your blood sugar is slightly elevated at 217.  Back to the ER if you have new or worsening symptoms which she will weakness in the leg, numbness or tingling especially in the groin.  Loss control of your bowels or bladder, fevers or other worrisome changes.

## 2022-10-01 NOTE — ED Notes (Signed)
Pt has left leg pain with no swelling, or redness noted. Pt stated his pain was a 10/10

## 2022-10-01 NOTE — ED Provider Notes (Cosign Needed Addendum)
[place] EMERGENCY DEPARTMENT AT [facility] Provider Note   CSN: [id number] Arrival date & time: 10/01/22  1243     History  Chief Complaint  Patient presents with   Leg Pain    Wayne Pratt is a 62 y.o. male.  Presents ER for 3 days of left hip and posterior leg pain, starts in the left low back and buttock and shoots down to his calf and foot.  Is worse with walking.  Denies numbness or tingling, no saddle anesthesia, no bowel or bladder incontinence.  He does have a history of diabetes, PAD in the right leg causing amputation of his toes in the past.  His wife states that they are most concerned that this could be complication of his peripheral arterial disease and wanted to be proactive.  He denies color changes, cool extremity, numbness or tingling, wounds on his extremity.  Denies injury.   Leg Pain      Home Medications Prior to Admission medications   Medication Sig Start Date End Date Taking? Authorizing Provider  methocarbamol (ROBAXIN) 500 MG tablet Take 1 tablet (500 mg total) by mouth every 6 (six) hours as needed for muscle spasms. 10/01/22  Yes Secilia Apps A, PA-C  naproxen (NAPROSYN) 500 MG tablet Take 1 tablet (500 mg total) by mouth 2 (two) times daily. 10/01/22  Yes Denasia Venn A, PA-C  acetaminophen (TYLENOL) 325 MG tablet Take 325-650 mg by mouth every 6 (six) hours as needed for mild pain or moderate pain.    [provider]  albuterol (VENTOLIN HFA) 108 (90 Base) MCG/ACT inhaler Inhale 1-2 puffs into the lungs every 6 (six) hours as needed for wheezing or shortness of breath. 03/15/19   Avegno, Zachery Dakins, FNP  aspirin EC 81 MG EC tablet Take 1 tablet (81 mg total) by mouth daily at 6 (six) AM. Swallow whole. 02/08/20   Emilie Rutter, PA-C  clonazePAM (KLONOPIN) 0.5 MG tablet Take 0.5 mg by mouth 2 (two) times daily.  06/17/17   [provider]  cyclobenzaprine (FLEXERIL) 10 MG tablet Take 1 tablet (10 mg total) by mouth 3  (three) times daily. 11/17/21   Meyran, Tiana Loft, NP  GLIPIZIDE XL 10 MG 24 hr tablet Take 10 mg by mouth 2 (two) times daily. 01/17/19   [provider]  insulin glargine (LANTUS SOLOSTAR) 100 UNIT/ML Solostar Pen Inject into the skin. 04/06/22 04/06/23  [provider]  lisinopril (ZESTRIL) 5 MG tablet TAKE (1) TABLET BY MOUTH DAILY 05/06/22   Jonelle Sidle, MD  lovastatin (MEVACOR) 40 MG tablet Take 40 mg by mouth at bedtime.  04/01/17   [provider]  Melatonin 10 MG TABS Take 40 mg by mouth at bedtime.    [provider]  metFORMIN (GLUCOPHAGE) 1000 MG tablet Take 1,000 mg by mouth 2 (two) times daily with a meal. 04/01/17   [provider]  Oxycodone HCl 10 MG TABS Take by mouth. 07/11/21   [provider]  pioglitazone (ACTOS) 45 MG tablet Take 45 mg by mouth daily. 04/13/21   [provider]      Allergies    Penicillins, Vancomycin, Ativan [lorazepam], and Baclofen    Review of Systems   Review of Systems  Physical Exam Updated Vital Signs BP (!) 156/83   Pulse 74   Temp 98 F (36.7 C) (Oral)   Resp 16   Ht 6\' 1"  (1.854 m)   Wt 104.3 kg   SpO2 99%  BMI 30.34 kg/m  Physical Exam Vitals and nursing note reviewed.  Constitutional:      General: He is not in acute distress.    Appearance: He is well-developed.  HENT:     Head: Normocephalic and atraumatic.  Eyes:     Conjunctiva/sclera: Conjunctivae normal.  Cardiovascular:     Rate and Rhythm: Normal rate and regular rhythm.     Heart sounds: No murmur heard. Pulmonary:     Effort: Pulmonary effort is normal. No respiratory distress.     Breath sounds: Normal breath sounds.  Abdominal:     Palpations: Abdomen is soft.     Tenderness: There is no abdominal tenderness.  Musculoskeletal:        General: No swelling.     Cervical back: Neck supple.     Comments: Mild left low back pain, positive left straight leg raise, normal strength in bilateral  lower extremities.  Skin:    General: Skin is warm and dry.     Capillary Refill: Capillary refill takes less than 2 seconds.  Neurological:     General: No focal deficit present.     Mental Status: He is alert and oriented to person, place, and time.     Comments: Gait is antalgic  Psychiatric:        Mood and Affect: Mood normal.     ED Results / Procedures / Treatments   Labs (all labs ordered are listed, but only abnormal results are displayed) Labs Reviewed  CBC WITH DIFFERENTIAL/PLATELET - Abnormal; Notable for the following components:      Result Value   RBC 4.16 (*)    Hemoglobin 12.1 (*)    All other components within normal limits  COMPREHENSIVE METABOLIC PANEL - Abnormal; Notable for the following components:   Sodium 133 (*)    Glucose, Bld 217 (*)    All other components within normal limits    EKG None  Radiology US ARTERIAL ABI (SCREENING LOWER EXTREMITY)  Result Date: 10/01/2022 CLINICAL DATA:  Left leg pain EXAM: NONINVASIVE PHYSIOLOGIC VASCULAR STUDY OF BILATERAL LOWER EXTREMITIES TECHNIQUE: Evaluation of both lower extremities were performed at rest, including calculation of ankle-brachial indices with single level Doppler, pressure and pulse volume recording. COMPARISON:  None Available. FINDINGS: Right ABI:  1.16 Left ABI:  1.16 Right Lower Extremity:  Normal arterial waveforms at the ankle. Left Lower Extremity:  Normal arterial waveforms at the ankle. 1.0-1.4 Normal IMPRESSION: Normal resting ABIs. Electronically Signed   By: Agustin Cree M.D.   On: 10/01/2022 19:25   US Venous Img Lower  Left (DVT Study)  Result Date: 10/01/2022 CLINICAL DATA:  Left leg pain EXAM: LEFT LOWER EXTREMITY VENOUS DOPPLER ULTRASOUND TECHNIQUE: Gray-scale sonography with compression, as well as color and duplex ultrasound, were performed to evaluate the deep venous system(s) from the level of the common femoral vein through the popliteal and proximal calf veins. COMPARISON:  June 18, 2020 FINDINGS: VENOUS Normal compressibility of the common femoral, superficial femoral, and popliteal veins, as well as the visualized calf veins. Visualized portions of profunda femoral vein and great saphenous vein unremarkable. No filling defects to suggest DVT on grayscale or color Doppler imaging. Doppler waveforms show normal direction of venous flow, normal respiratory plasticity and response to augmentation. Limited views of the contralateral common femoral vein are unremarkable. OTHER None. Limitations: none IMPRESSION: Negative. Electronically Signed   By: Meda Klinefelter M.D.   On: 10/01/2022 16:31    Procedures Procedures  Medications Ordered in ED Medications  fentaNYL (SUBLIMAZE) injection 25 mcg (25 mcg Intravenous Given 10/01/22 1621)  ketorolac (TORADOL) 15 MG/ML injection 15 mg (15 mg Intravenous Given 10/01/22 1729)    ED Course/ Medical Decision Making/ A&P                                 Medical Decision Making Ddx: Sciatica, DVT, PAD, other  Course: Patient here with left low back pain region posterior thigh into his foot, positive straight leg raise likely sciatica.  DVT study is negative, ultrasound is pending, discussed with patient we will plan to treat conservatively with medication for sciatica have him follow with PCP if this is negative.  He is diabetic so we will avoid steroids.  Ultrasound still pending signed out to oncoming PA Ariel.  Amount and/or Complexity of Data Reviewed Labs: ordered.  Risk Prescription drug management.           Final Clinical Impression(s) / ED Diagnoses Final diagnoses:  Sciatica, left side  Sciatica of left side    Rx / DC Orders ED Discharge Orders          Ordered    methocarbamol (ROBAXIN) 500 MG tablet  Every 6 hours PRN        10/01/22 1936    naproxen (NAPROSYN) 500 MG tablet  2 times daily        10/01/22 1936              Josem Kaufmann 10/01/22 1930    Josem Kaufmann 10/01/22 Chancy Milroy, MD 10/02/22 1248

## 2022-10-01 NOTE — ED Notes (Signed)
Patient transported to X-ray

## 2022-10-01 NOTE — ED Triage Notes (Signed)
Pt comes in POV for lt leg pain starting past 3 days and progressively getting worse. Pt states pain from lt buttock down lt leg. Pt is having trouble with ambulation more than 20 ft. Pt has hx of peripheral vascular removal on the rt leg in 2022.

## 2022-10-01 NOTE — ED Provider Triage Note (Signed)
Emergency Medicine Provider Triage Evaluation Note  ZAEDIN BASHA , a 62 y.o. male  was evaluated in triage.  Pt complains of left leg pain.  Patient with history of diabetes, fatty liver, ischemia in the right lower extremity, bilateral PE, hyperlipidemia, hypertension presents emergency department concern of leg pain.  Reports the pain began in his left leg approximately 3 days ago was been worsening steadily.  Feels the pain is radiating from his left buttock down to his left lower leg.  Reports that both with ambulation.  Previously had excision of right lower leg muscle due to complications of ischemia.  Review of Systems  Positive: As above Negative: As above  Physical Exam  BP 121/78 (BP Location: Right Arm)   Pulse (!) 102   Temp 98.7 F (37.1 C) (Oral)   Resp 16   Ht 6\' 1"  (1.854 m)   Wt 104.3 kg   SpO2 98%   BMI 30.34 kg/m  Gen:   Awake, no distress   Resp:  Normal effort  MSK:   Moves extremities without difficulty  Other:  Weak pulse noted at PT and DP. No obvious swelling, discoloration, or ulceration of the left leg.  Medical Decision Making  Medically screening exam initiated at 1:21 PM.  Appropriate orders placed.  JACAIDEN DUXBURY was informed that the remainder of the evaluation will be completed by another provider, this initial triage assessment does not replace that evaluation, and the importance of remaining in the ED until their evaluation is complete.     Smitty Knudsen, PA-C 10/01/22 1325

## 2022-10-05 ENCOUNTER — Ambulatory Visit (HOSPITAL_COMMUNITY): Payer: PPO

## 2022-10-05 ENCOUNTER — Ambulatory Visit: Payer: PPO

## 2022-11-08 ENCOUNTER — Other Ambulatory Visit: Payer: Self-pay | Admitting: Cardiology

## 2023-05-24 ENCOUNTER — Other Ambulatory Visit: Payer: Self-pay | Admitting: Family Medicine

## 2023-08-13 ENCOUNTER — Other Ambulatory Visit: Payer: Self-pay | Admitting: Family Medicine

## 2023-12-12 DEATH — deceased
